# Patient Record
Sex: Female | Born: 2005 | Race: Black or African American | Hispanic: No | Marital: Single | Smoking: Never smoker
Health system: Southern US, Community
[De-identification: ages and names within clinical notes are randomized; demographics above are authoritative.]

## PROBLEM LIST (undated history)

## (undated) ENCOUNTER — Emergency Department (HOSPITAL_COMMUNITY): Source: Home / Self Care

## (undated) ENCOUNTER — Emergency Department (HOSPITAL_COMMUNITY): Admission: EM | Payer: Medicaid Other | Source: Home / Self Care

## (undated) DIAGNOSIS — D571 Sickle-cell disease without crisis: Secondary | ICD-10-CM

## (undated) DIAGNOSIS — F419 Anxiety disorder, unspecified: Secondary | ICD-10-CM

## (undated) DIAGNOSIS — F32A Depression, unspecified: Secondary | ICD-10-CM

## (undated) DIAGNOSIS — F4310 Post-traumatic stress disorder, unspecified: Secondary | ICD-10-CM

## (undated) HISTORY — PX: CHOLECYSTECTOMY: SHX55

## (undated) HISTORY — PX: WRIST SURGERY: SHX841

---
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
## 2023-03-29 DIAGNOSIS — F411 Generalized anxiety disorder: Secondary | ICD-10-CM | POA: Diagnosis present

## 2023-05-27 ENCOUNTER — Emergency Department: Payer: Medicaid Other

## 2023-05-27 ENCOUNTER — Emergency Department
Admission: EM | Admit: 2023-05-27 | Discharge: 2023-05-28 | Disposition: A | Discharge status: Other Acute Inpatient Hospital | Payer: Medicaid Other | Attending: Internal Medicine | Admitting: Internal Medicine

## 2023-05-27 ENCOUNTER — Encounter (HOSPITAL_COMMUNITY): Payer: Self-pay

## 2023-05-27 ENCOUNTER — Encounter: Payer: Self-pay | Admitting: Emergency Medicine

## 2023-05-27 ENCOUNTER — Other Ambulatory Visit: Payer: Self-pay

## 2023-05-27 DIAGNOSIS — Z79899 Other long term (current) drug therapy: Secondary | ICD-10-CM

## 2023-05-27 DIAGNOSIS — R079 Chest pain, unspecified: Secondary | ICD-10-CM | POA: Insufficient documentation

## 2023-05-27 DIAGNOSIS — F32A Depression, unspecified: Secondary | ICD-10-CM | POA: Diagnosis present

## 2023-05-27 DIAGNOSIS — Z833 Family history of diabetes mellitus: Secondary | ICD-10-CM

## 2023-05-27 DIAGNOSIS — Z91010 Allergy to peanuts: Secondary | ICD-10-CM

## 2023-05-27 DIAGNOSIS — R0602 Shortness of breath: Secondary | ICD-10-CM | POA: Diagnosis not present

## 2023-05-27 DIAGNOSIS — Z1152 Encounter for screening for COVID-19: Secondary | ICD-10-CM

## 2023-05-27 DIAGNOSIS — Z91018 Allergy to other foods: Secondary | ICD-10-CM

## 2023-05-27 DIAGNOSIS — D57419 Sickle-cell thalassemia with crisis, unspecified: Secondary | ICD-10-CM | POA: Insufficient documentation

## 2023-05-27 DIAGNOSIS — R7989 Other specified abnormal findings of blood chemistry: Secondary | ICD-10-CM | POA: Diagnosis not present

## 2023-05-27 DIAGNOSIS — J45909 Unspecified asthma, uncomplicated: Secondary | ICD-10-CM | POA: Diagnosis present

## 2023-05-27 DIAGNOSIS — J690 Pneumonitis due to inhalation of food and vomit: Secondary | ICD-10-CM | POA: Diagnosis present

## 2023-05-27 DIAGNOSIS — J189 Pneumonia, unspecified organism: Secondary | ICD-10-CM | POA: Diagnosis not present

## 2023-05-27 DIAGNOSIS — Z7951 Long term (current) use of inhaled steroids: Secondary | ICD-10-CM

## 2023-05-27 DIAGNOSIS — F419 Anxiety disorder, unspecified: Secondary | ICD-10-CM | POA: Diagnosis present

## 2023-05-27 DIAGNOSIS — J351 Hypertrophy of tonsils: Secondary | ICD-10-CM | POA: Diagnosis present

## 2023-05-27 DIAGNOSIS — Z885 Allergy status to narcotic agent status: Secondary | ICD-10-CM

## 2023-05-27 DIAGNOSIS — F4310 Post-traumatic stress disorder, unspecified: Secondary | ICD-10-CM | POA: Diagnosis present

## 2023-05-27 DIAGNOSIS — D5700 Hb-SS disease with crisis, unspecified: Secondary | ICD-10-CM | POA: Diagnosis not present

## 2023-05-27 DIAGNOSIS — Z8673 Personal history of transient ischemic attack (TIA), and cerebral infarction without residual deficits: Secondary | ICD-10-CM

## 2023-05-27 DIAGNOSIS — G8929 Other chronic pain: Secondary | ICD-10-CM | POA: Diagnosis present

## 2023-05-27 DIAGNOSIS — M879 Osteonecrosis, unspecified: Secondary | ICD-10-CM | POA: Diagnosis present

## 2023-05-27 LAB — COMPREHENSIVE METABOLIC PANEL
ALT: 289 U/L — ABNORMAL HIGH (ref 0–44)
AST: 108 U/L — ABNORMAL HIGH (ref 15–41)
Albumin: 4 g/dL (ref 3.5–5.0)
Alkaline Phosphatase: 195 U/L — ABNORMAL HIGH (ref 38–126)
Anion gap: 8 (ref 5–15)
BUN: 6 mg/dL (ref 6–20)
CO2: 22 mmol/L (ref 22–32)
Calcium: 8.8 mg/dL — ABNORMAL LOW (ref 8.9–10.3)
Chloride: 107 mmol/L (ref 98–111)
Creatinine, Ser: 0.51 mg/dL (ref 0.44–1.00)
GFR, Estimated: >60 mL/min (ref >60)
Glucose, Bld: 95 mg/dL (ref 70–99)
Potassium: 3.6 mmol/L (ref 3.5–5.1)
Sodium: 137 mmol/L (ref 135–145)
Total Bilirubin: 3.3 mg/dL — ABNORMAL HIGH (ref 0.0–1.2)
Total Protein: 7.9 g/dL (ref 6.5–8.1)

## 2023-05-27 LAB — CBC WITH DIFFERENTIAL/PLATELET
Abs Immature Granulocytes: 0.12 10*3/uL — ABNORMAL HIGH (ref 0.00–0.07)
Basophils Absolute: 0.1 10*3/uL (ref 0.0–0.1)
Basophils Relative: 1 %
Eosinophils Absolute: 0.2 10*3/uL (ref 0.0–0.5)
Eosinophils Relative: 2 %
HCT: 24 % — ABNORMAL LOW (ref 36.0–46.0)
Hemoglobin: 8.4 g/dL — ABNORMAL LOW (ref 12.0–15.0)
Immature Granulocytes: 1 %
Lymphocytes Relative: 21 %
Lymphs Abs: 2.9 10*3/uL (ref 0.7–4.0)
MCH: 38.2 pg — ABNORMAL HIGH (ref 26.0–34.0)
MCHC: 35 g/dL (ref 30.0–36.0)
MCV: 109.1 fL — ABNORMAL HIGH (ref 80.0–100.0)
Monocytes Absolute: 1.6 10*3/uL — ABNORMAL HIGH (ref 0.1–1.0)
Monocytes Relative: 11 %
Neutro Abs: 9.3 10*3/uL — ABNORMAL HIGH (ref 1.7–7.7)
Neutrophils Relative %: 64 %
Platelets: 411 10*3/uL — ABNORMAL HIGH (ref 150–400)
RBC: 2.2 MIL/uL — ABNORMAL LOW (ref 3.87–5.11)
RDW: 19.1 % — ABNORMAL HIGH (ref 11.5–15.5)
Smear Review: NORMAL
WBC: 14.3 10*3/uL — ABNORMAL HIGH (ref 4.0–10.5)
nRBC: 14.1 % — ABNORMAL HIGH (ref 0.0–0.2)

## 2023-05-27 LAB — RETICULOCYTES
Immature Retic Fract: 37.8 % — ABNORMAL HIGH (ref 2.3–15.9)
RBC.: 2.18 MIL/uL — ABNORMAL LOW (ref 3.87–5.11)
Retic Count, Absolute: 386.5 10*3/uL — ABNORMAL HIGH (ref 19.0–186.0)
Retic Ct Pct: 17.7 % — ABNORMAL HIGH (ref 0.4–3.1)

## 2023-05-27 LAB — GROUP A STREP BY PCR: Group A Strep by PCR: NOT DETECTED

## 2023-05-27 LAB — TROPONIN I (HIGH SENSITIVITY)
Troponin I (High Sensitivity): 4 ng/L (ref <18)
Troponin I (High Sensitivity): 6 ng/L (ref <18)

## 2023-05-27 LAB — D-DIMER, QUANTITATIVE: D-Dimer, Quant: 2.42 ug{FEU}/mL — ABNORMAL HIGH (ref 0.00–0.50)

## 2023-05-27 LAB — TYPE AND SCREEN

## 2023-05-27 LAB — RESP PANEL BY RT-PCR (RSV, FLU A&B, COVID)  RVPGX2
Influenza A by PCR: NEGATIVE
Influenza B by PCR: NEGATIVE
Resp Syncytial Virus by PCR: NEGATIVE
SARS Coronavirus 2 by RT PCR: NEGATIVE

## 2023-05-27 LAB — LACTIC ACID, PLASMA
Lactic Acid, Venous: 1 mmol/L (ref 0.5–1.9)
Lactic Acid, Venous: 1 mmol/L (ref 0.5–1.9)

## 2023-05-27 LAB — HCG, QUANTITATIVE, PREGNANCY: hCG, Beta Chain, Quant, S: <1 m[IU]/mL (ref <5)

## 2023-05-27 LAB — PROTIME-INR
INR: 1.3 — ABNORMAL HIGH (ref 0.8–1.2)
Prothrombin Time: 16.1 s — ABNORMAL HIGH (ref 11.4–15.2)

## 2023-05-27 LAB — PROCALCITONIN: Procalcitonin: <0.1 ng/mL

## 2023-05-27 MED ORDER — OXYCODONE HCL 5 MG PO TABS: 10.0000 mg | ORAL_TABLET | ORAL | Status: DC | PRN

## 2023-05-27 MED ORDER — HYDROMORPHONE HCL 1 MG/ML IJ SOLN
2.0000 mg | INTRAMUSCULAR | Status: AC
Start: 2023-05-27 — End: 2023-05-27
  Administered 2023-05-27: 2 mg via INTRAVENOUS
  Filled 2023-05-27: qty 2

## 2023-05-27 MED ORDER — IOHEXOL 350 MG/ML SOLN
75.0000 mL | Freq: Once | INTRAVENOUS | Status: AC | PRN
  Administered 2023-05-27: 75 mL via INTRAVENOUS

## 2023-05-27 MED ORDER — MOMETASONE FURO-FORMOTEROL FUM 100-5 MCG/ACT IN AERO: 2.0000 | INHALATION_SPRAY | Freq: Two times a day (BID) | RESPIRATORY_TRACT | Status: DC

## 2023-05-27 MED ORDER — FLUTICASONE PROPIONATE 50 MCG/ACT NA SUSP: 1.0000 | Freq: Every day | NASAL | Status: DC | PRN

## 2023-05-27 MED ORDER — ONDANSETRON HCL 4 MG/2ML IJ SOLN: 4.0000 mg | INTRAMUSCULAR | Status: DC | PRN

## 2023-05-27 MED ORDER — OXYCODONE HCL 5 MG PO TABS: 5.0000 mg | ORAL_TABLET | ORAL | Status: DC | PRN

## 2023-05-27 MED ORDER — CEFTRIAXONE SODIUM 2 G IJ SOLR
2.0000 g | Freq: Once | INTRAMUSCULAR | Status: AC
  Administered 2023-05-27: 2 g via INTRAVENOUS
  Filled 2023-05-27: qty 20

## 2023-05-27 MED ORDER — HYDROMORPHONE HCL 1 MG/ML IJ SOLN
2.0000 mg | INTRAMUSCULAR | Status: AC
  Administered 2023-05-27: 2 mg via INTRAVENOUS
  Filled 2023-05-27: qty 2

## 2023-05-27 MED ORDER — SODIUM CHLORIDE 0.9 % IV SOLN
2.0000 g | INTRAVENOUS | Status: DC
  Administered 2023-05-28: 2 g via INTRAVENOUS
  Filled 2023-05-27: qty 20

## 2023-05-27 MED ORDER — GUAIFENESIN 100 MG/5ML PO LIQD: 5.0000 mL | ORAL | Status: DC | PRN

## 2023-05-27 MED ORDER — HYDROMORPHONE HCL 1 MG/ML IJ SOLN
2.0000 mg | INTRAMUSCULAR | Status: DC | PRN
  Administered 2023-05-27 – 2023-05-28 (×16): 2 mg via INTRAVENOUS
  Filled 2023-05-27 (×16): qty 2

## 2023-05-27 MED ORDER — KETOROLAC TROMETHAMINE 30 MG/ML IJ SOLN
30.0000 mg | Freq: Once | INTRAMUSCULAR | Status: AC
  Administered 2023-05-27: 30 mg via INTRAVENOUS
  Filled 2023-05-27: qty 1

## 2023-05-27 MED ORDER — OXYCODONE HCL 5 MG PO TABS
10.0000 mg | ORAL_TABLET | Freq: Once | ORAL | Status: AC
  Administered 2023-05-27: 10 mg via ORAL
  Filled 2023-05-27: qty 2

## 2023-05-27 MED ORDER — DULOXETINE HCL 20 MG PO CPEP
40.0000 mg | ORAL_CAPSULE | Freq: Every day | ORAL | Status: DC
  Administered 2023-05-27 – 2023-05-28 (×2): 40 mg via ORAL
  Filled 2023-05-27 (×2): qty 2

## 2023-05-27 MED ORDER — ALBUTEROL SULFATE (2.5 MG/3ML) 0.083% IN NEBU: 2.5000 mL | INHALATION_SOLUTION | Freq: Four times a day (QID) | RESPIRATORY_TRACT | Status: DC | PRN

## 2023-05-27 MED ORDER — ACETAMINOPHEN 325 MG PO TABS: 650.0000 mg | ORAL_TABLET | Freq: Four times a day (QID) | ORAL | Status: DC | PRN

## 2023-05-27 MED ORDER — HYDROMORPHONE HCL 1 MG/ML IJ SOLN
1.0000 mg | INTRAMUSCULAR | Status: DC | PRN
  Administered 2023-05-27: 1 mg via INTRAVENOUS
  Filled 2023-05-27: qty 1

## 2023-05-27 MED ORDER — SODIUM CHLORIDE 0.9 % IV SOLN
INTRAVENOUS | Status: DC
Start: 2023-05-27 — End: 2023-05-28

## 2023-05-27 MED ORDER — SODIUM CHLORIDE 0.9 % IV SOLN
500.0000 mg | Freq: Once | INTRAVENOUS | Status: AC
  Administered 2023-05-27: 500 mg via INTRAVENOUS
  Filled 2023-05-27: qty 5

## 2023-05-27 MED ORDER — SODIUM CHLORIDE 0.9 % IV SOLN
500.0000 mg | INTRAVENOUS | Status: AC
  Administered 2023-05-28: 500 mg via INTRAVENOUS
  Filled 2023-05-27: qty 5

## 2023-05-27 NOTE — Consult Note (Signed)
 Initial Consultation Note   Patient: Bailey Reese FMW:[id number] DOB: [birth year] PCP: System, Provider Not In DOA: 05/27/2023 DOS: the patient was seen and examined on 05/27/2023 Primary service: Claudene Rover, MD  Referring physician: Neomi Neptune MD Reason for consult: Sickle cell crisis.  Assessment/Plan: Bailey Reese is a 18 y.o. 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 She does have evidence of pneumonia on CT chest.   Recommendations Sickle cell pain crisis- Reticulocyte count 386.5, D-dimer 2.42, CT chest negative for PE. Hemoglobin 8.4.  Monitor H&H closely.  Active type and screen. Patient will be continued on IV hydration. Continue IV dilaudid  for severe pain and oral opiates for break through. Patient is not admitted to [facility], recommended transfer to [facility] once bed is available.  Pneumonia- Interstitial patchy opacities in the lower lobes concerning for pneumonia. She is not hypoxic, does have productive cough. Continue with Rocephin , azithromycin  therapy. Bronchodilators, mucinex  ordered. Encourage incentive spirometry.  Elevated LFT: Care everywhere records reviewed. Recent admission to [facility] showed high LFT presumed to be from Olanzapine  started for her nausea. Liver uls 1/9 unremarkable. LFT trending down. Continue to monitor.  Asthma: Home inhalers resumed.  Anxiety: Cymbalta  ordered.   TRH will continue to follow the patient.  HPI: Bailey Reese is a 18 y.o. 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 Pain did not improve with her home dose Oxycodone  progressively worsened brought in by EMS. She denies fever, chills. No nausea, reports low appetite. She is weak.   In the ED, patient is started on IV fluids, IV antibiotics, IV opiate medications, IV Zofran .  Laboratory findings showed reticulocyte count 386, WBC 14.3, hemoglobin 8.4, AST 108, ALT 289, total bili 3.3, D-dimer 2.42.  CT angio chest revealed trace pleural effusions, interstitial and patchy opacities in the lower lobes possible pneumonia, aspiration or low inspiration, upper lobe air trapping question pharyngitis, avascular necrosis right humeral head.  Review of Systems: As mentioned in the history of present illness. All other systems reviewed and are negative. Past Medical History:  Diagnosis Date   Anxiety    Depression    PTSD (post-traumatic stress disorder)    Sickle cell anemia (HCC)    Past Surgical History:  Procedure Laterality Date   CHOLECYSTECTOMY     WRIST SURGERY     Social History:  reports that she has never smoked. She has never used smokeless tobacco. She reports that she does not drink alcohol . No history on file for drug use.  Allergies  Allergen Reactions   Other Swelling    Pitted fruits   Prunus Persica Swelling    Lips swell    Cherry Itching    Lips itch   Cherry Itching, Swelling and Other (See Comments)    Mouth and throat get itchy & lips swell; throat tightens   Morphine  And Codeine Hives and Itching   Other Itching, Swelling and Other (See Comments)    PITTED FRUITS = Mouth and throat get itchy & lips swell; throat tightens    Peach [Prunus Persica] Itching,  Swelling and Other (See Comments)    Mouth and throat get itchy & lips swell; throat tightens   Peanut Butter Flavoring Agent (Non-Screening) Itching    Per pt mouth gets itchy   Peanut-Containing Drug Products Itching, Swelling and Other (See Comments)    Mouth and throat get  itchy & lips swell; throat tightens; chest tightens   Plum Pulp Itching, Swelling and Other (See Comments)    Mouth and throat get itchy & lips swell; throat tightens    Family History  Problem Relation Age of Onset   Diabetes Paternal Aunt     Prior to Admission medications   Medication Sig Start Date End Date Taking? Authorizing Provider  albuterol  (PROAIR  HFA) 108 (90 Base) MCG/ACT inhaler Inhale 2 puffs into the lungs every 6 (six) hours as needed for wheezing or shortness of breath.   Yes [provider]  budesonide-formoterol  (SYMBICORT) 80-4.5 MCG/ACT inhaler Inhale 2 puffs into the lungs daily. 12/29/22 12/29/23 Yes [provider]  Cholecalciferol (VITAMIN D -1000 MAX ST) 25 MCG (1000 UT) tablet Take 1,000 Units by mouth daily. 09/02/20  Yes [provider]  DULoxetine  (CYMBALTA ) 20 MG capsule Take 40 mg by mouth daily.   Yes [provider]  fexofenadine (ALLEGRA) 180 MG tablet Take 180 mg by mouth daily as needed. 12/29/22  Yes [provider]  fluticasone  (FLONASE ) 50 MCG/ACT nasal spray Place 1 spray into both nostrils daily as needed. 12/29/22  Yes [provider]  hydroxyurea  (HYDREA ) 500 MG capsule Take 1,000 mg by mouth daily. May take with food to minimize GI side effects.   Yes [provider]  morphine  (MS CONTIN ) 30 MG 12 hr tablet Take 1 tablet (30 mg total) by mouth every 12 (twelve) hours. 08/30/17  Yes Beg, Amber, MD  OLANZapine  zydis (ZYPREXA ) 5 MG disintegrating tablet Take 5 mg by mouth at bedtime. 04/29/23  Yes [provider]  ondansetron  (ZOFRAN -ODT) 4 MG disintegrating tablet Take 4 mg by mouth every 8 (eight) hours as needed. 02/02/23  Yes [provider]  oxyCODONE  (OXY IR/ROXICODONE ) 5 MG immediate release tablet Take 1 tablet (5 mg total) by mouth every 4 (four) hours as needed for severe pain or breakthrough pain. 08/30/17  Yes Beg, Amber, MD  promethazine  (PHENERGAN ) 12.5 MG tablet  Take 12.5 mg by mouth every 6 (six) hours as needed. 05/10/23  Yes [provider]  SUBOXONE  2-0.5 MG FILM Place 1 Film under the tongue daily. 02/16/23  Yes [provider]  venlafaxine  XR (EFFEXOR -XR) 37.5 MG 24 hr capsule Take 37.5 mg by mouth daily. 02/09/23  Yes [provider]  acetaminophen  (TYLENOL ) 500 MG tablet Take 1 tablet (500 mg total) by mouth every 6 (six) hours as needed (for pain). Patient not taking: Reported on 05/27/2023 08/30/17   Edris Gauze, MD  ibuprofen  (ADVIL ,MOTRIN ) 400 MG tablet Take 400 mg by mouth every 6 (six) hours as needed (for pain). Patient not taking: Reported on 05/27/2023    [provider]    Physical Exam: Vitals:   05/27/23 0830 05/27/23 0845 05/27/23 0846 05/27/23 0900  BP: (!) 106/55   118/79  Pulse: 85   74  Resp:  14  11  Temp:   98.3 F (36.8 C)   TempSrc:   Oral   SpO2: 96%   98%  Weight:      Height:       General - Young African-American female, in distress due to pain HEENT - PERRLA, EOMI,  atraumatic head, non tender sinuses. Lung - Clear, he has rhonchi, no wheezes. Heart - S1, S2 heard, no murmurs, rubs, trace pedal edema. Abdomen-soft, nontender, nondistended Neuro - Alert, awake and oriented x 3, non focal exam. Skin - Warm and dry. Data Reviewed:      Latest Ref Rng & Units 05/27/2023   12:22 AM 05/16/2021    2:14 AM 09/24/2017    6:36 AM  CBC  WBC 4.0 - 10.5 K/uL 14.3  20.1  20.9   Hemoglobin 12.0 - 15.0 g/dL 8.4  7.3  6.0   Hematocrit 36.0 - 46.0 % 24.0  20.6  16.6   Platelets 150 - 400 K/uL 411  423  237       Latest Ref Rng & Units 05/27/2023   12:22 AM 05/16/2021    2:14 AM 09/19/2017   12:05 AM  BMP  Glucose 70 - 99 mg/dL 95  80  93   BUN 6 - 20 mg/dL 6  10  7    Creatinine 0.44 - 1.00 mg/dL 9.48  9.53  9.55   Sodium 135 - 145 mmol/L 137  134  138   Potassium 3.5 - 5.1 mmol/L 3.6  3.4  4.5   Chloride 98 - 111 mmol/L 107  105  105   CO2 22 - 32 mmol/L 22  20  25    Calcium 8.9 -  10.3 mg/dL 8.8  9.6  9.8    CT Angio Chest PE W and/or Wo Contrast Result Date: 05/27/2023 CLINICAL DATA:  18 year old with history of sickle cell disease, currently in sickle cell crisis with generalized body pain. EXAM: CT ANGIOGRAPHY CHEST WITH CONTRAST TECHNIQUE: Multidetector CT imaging of the chest was performed using the standard protocol during bolus administration of intravenous contrast. Multiplanar CT image reconstructions and MIPs were obtained to evaluate the vascular anatomy. RADIATION DOSE REDUCTION: This exam was performed according to the departmental dose-optimization program which includes automated exposure control, adjustment of the mA and/or kV according to patient size and/or use of iterative reconstruction technique. CONTRAST:  75mL OMNIPAQUE  IOHEXOL  350 MG/ML SOLN COMPARISON:  Portable chest from today, PA and lateral chest 05/16/2021. No prior cross-sectional imaging. FINDINGS: Cardiovascular: The aorta and great vessels are normal. The pulmonary trunk is prominent measuring 3.0 cm suggesting arterial hypertension, but no arterial embolism is seen. There is mild-to-moderate panchamber cardiomegaly without findings of acute right heart strain. Small pericardial effusion. Pulmonary veins are normal caliber. Mediastinum/Nodes: There are prominent palatine tonsils abutting the uvula, partially visible on the first few slices. Correlate clinically for pharyngitis. Thyroid gland and axillary spaces are unremarkable. There is residual thymus in the anterior mediastinum. No intrathoracic adenopathy is seen. The thoracic trachea, thoracic esophagus and main bronchi are unremarkable. Lungs/Pleura: There are symmetric trace pleural effusions. There is a low inspiration on exam. Interstitial and patchy opacities of the bilateral lower lobes could be due to pneumonia, aspiration, or low inspiration. In the upper lobes, other is mosaic attenuation which can be seen with small airways disease with air  trapping, but no focal infiltrate. The right middle lobe is clear. Upper Abdomen: Status post cholecystectomy. No acute findings. There is a nonspecific 1 cm hypodense lesion noted in the superior spleen, another more inferiorly measuring 1.8 cm. Musculoskeletal: Mild thoracic kyphodextroscoliosis. There are central endplate defects in some of the thoracic vertebrae consistent with history of sickle cell disease. 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 No other significant osseous findings.  No chest wall mass is seen.  Review of the MIP images confirms the above findings. IMPRESSION: 1. No evidence of arterial embolus. 2. Prominent pulmonary trunk suggesting arterial hypertension. 3. Cardiomegaly with small pericardial effusion. 4. Trace pleural effusions. 5. Interstitial and patchy opacities in the lower lobes which could be due to pneumonia, aspiration, or low inspiration. 6. Mosaic attenuation in the upper lobes which can be seen with small airways disease with air trapping. 7. Prominent palatine tonsils abutting the uvula. Correlate clinically for pharyngitis. 8. Nonspecific 1 cm and 1.8 cm hypodense lesions in the spleen. Follow-up MRI with and without contrast recommended. 9. Avascular necrosis superior right humeral head. 10. Central endplate defects in some of the thoracic vertebrae consistent with history of sickle cell disease. Electronically Signed   By: Francis Quam M.D.   On: 05/27/2023 04:01   DG Chest Portable 1 View Result Date: 05/27/2023 CLINICAL DATA:  Sickle cell crisis with generalized body pain. EXAM: PORTABLE CHEST 1 VIEW COMPARISON:  May 16, 2021 FINDINGS: The heart size and mediastinal contours are within normal limits. Breast attenuation artifact is seen overlying the bilateral lung bases. Both lungs are otherwise clear. The visualized skeletal structures are unremarkable. IMPRESSION: No active disease. Electronically Signed   By: Suzen Dials M.D.   On:  05/27/2023 01:18       Family Communication: I discussed with patient's mother over phone regarding current care plan, understands and agrees with transfer plan once bed available. Primary team communication: secure chat sent to ED physician. Thank you very much for involving us  in the care of your patient.  Author: Concepcion Riser, MD 05/27/2023 9:28 AM  For on call review [email or web address].

## 2023-05-27 NOTE — ED Notes (Signed)
 Called [facility] spoke with Sherry/ Consult to transfer/Specialty -Medicine (but they are closed) waiting on rep to call back/facesheet faxed images powershared

## 2023-05-27 NOTE — ED Triage Notes (Signed)
 Pt note hx of Sickle cell and is currently have a crisis with pain to generalized body. Pt normally takes oxycodone  10mg  several times a day as prescribed, but pain has increased since last dose at 6pm tonight pta to ED. Pt presents with oxygen from EMS due to shallow breathing due to generalized pain. Pt on 2L [place] upon triage.

## 2023-05-27 NOTE — ED Notes (Signed)
 Pt assisted in cleaning herself up. Linens changed

## 2023-05-27 NOTE — Progress Notes (Signed)
 PHARMACY - PHYSICIAN COMMUNICATION CRITICAL VALUE ALERT - BLOOD CULTURE IDENTIFICATION (BCID)  KAILA DEVRIES is an 18 y.o. female who presented to [facility] on 05/27/2023 with a chief complaint of Sickle Cell pain crisis.  Assessment:  1/4 bottles (aerobic) GPR; no BCID. Sent to [facility] for further evaluation.  Name of physician (or Provider) Contacted: Erminio Cone, NP  Current antibiotics: Azithromycin , ceftriaxone   Changes to prescribed antibiotics recommended:  Patient is on recommended antibiotics - No changes needed  No results found for this or any previous visit.  Lum VEAR Mania, PharmD Clinical Pharmacist  05/27/2023  7:06 PM

## 2023-05-27 NOTE — ED Provider Notes (Signed)
 [facility] Provider Note    Event Date/Time   First MD Initiated Contact with Patient 05/27/23 [phone]     (approximate)   History   Sickle Cell Pain Crisis (Pt note hx of Sickle cell and is currently have a crisis with pain to generalized body. Pt normally takes oxycodone  10mg  several times a day as prescribed, but pain has increased since last dose at 6pm tonight pta to ED. Pt presents with oxygen from EMS due to shallow breathing due to generalized pain. Pt on 2L [place] upon triage. )   HPI  Bailey Reese is a 18 y.o. female with history of sickle cell disease who presents to the emergency department with a sickle cell pain crisis that started today.  Complains of diffuse pain, chest pain and shortness of breath.  No fever but has had cough.  Reports she has had previous history of superficial clots.  Not on blood thinners.  No calf pain or calf tenderness.  Sats of 90% with EMS.  They placed her on a nonrebreather and route.  She states she does wear oxygen occasionally at home.  Denies any fevers, cough.  Reports she has had nausea and vomiting.  No diarrhea.  She is followed at [facility].   History provided by patient, EMS.    Past Medical History:  Diagnosis Date   Anxiety    Depression    PTSD (post-traumatic stress disorder)    Sickle cell anemia (HCC)     Past Surgical History:  Procedure Laterality Date   CHOLECYSTECTOMY     WRIST SURGERY      MEDICATIONS:  Prior to Admission medications   Medication Sig Start Date End Date Taking? Authorizing Provider  acetaminophen  (TYLENOL ) 500 MG tablet Take 1 tablet (500 mg total) by mouth every 6 (six) hours as needed (for pain). 08/30/17   Edris Gauze, MD  albuterol  (PROAIR  HFA) 108 (90 Base) MCG/ACT inhaler Inhale 2 puffs into the lungs every 6 (six) hours as needed for wheezing or shortness of breath.    [provider]  DULoxetine  (CYMBALTA ) 20 MG capsule Take 40 mg by mouth daily.    [provider]  hydroxyurea  (HYDREA ) 500 MG capsule Take 1,000 mg by mouth daily. May take with food to minimize GI side effects.    [provider]  ibuprofen  (ADVIL ,MOTRIN ) 400 MG tablet Take 400 mg by mouth every 6 (six) hours as needed (for pain).    [provider]  morphine  (MS CONTIN ) 30 MG 12 hr tablet Take 1 tablet (30 mg total) by mouth every 12 (twelve) hours. 08/30/17   Edris Gauze, MD  oxyCODONE  (OXY IR/ROXICODONE ) 5 MG immediate release tablet Take 1 tablet (5 mg total) by mouth every 4 (four) hours as needed for severe pain or breakthrough pain. 08/30/17   Edris Gauze, MD    Physical Exam   Triage Vital Signs: ED Triage Vitals [05/27/23 0014]  Encounter Vitals Group     BP (!) 132/106     Systolic BP Percentile      Diastolic BP Percentile      Pulse Rate (!) 106     Resp (!) 32     Temp 98.2 F (36.8 C)     Temp Source Oral     SpO2 100 %     Weight 140 lb (63.5 kg)     Height 5' 4 (1.626 m)     Head Circumference  Peak Flow      Pain Score 10     Pain Loc      Pain Education      Exclude from Growth Chart      Most recent vital signs: Vitals:   05/27/23 0738 05/27/23 0740  BP:  (!) 104/58  Pulse:  98  Resp: 15   Temp:    SpO2:  96%    CONSTITUTIONAL: Alert, responds appropriately to questions appears uncomfortable HEAD: Normocephalic, atraumatic EYES: Conjunctivae clear, pupils appear equal, sclera nonicteric ENT: normal nose; moist mucous membranes NECK: Supple, normal ROM CARD: Regular and tachycardic; S1 and S2 appreciated RESP: Patient is tachypneic; breath sounds clear and equal bilaterally; no wheezes, no rhonchi, no rales, no hypoxia or respiratory distress, speaking full sentences ABD/GI: Non-distended; soft, non-tender, no rebound, no guarding, no peritoneal signs BACK: The back appears normal EXT: Normal ROM in all joints; no deformity noted, no edema SKIN: Normal color for age and race; warm; no rash on exposed  skin NEURO: Moves all extremities equally, normal speech PSYCH: The patient's mood and manner are appropriate.   ED Results / Procedures / Treatments   LABS: (all labs ordered are listed, but only abnormal results are displayed) Labs Reviewed  CBC WITH DIFFERENTIAL/PLATELET - Abnormal; Notable for the following components:      Result Value   WBC 14.3 (*)    RBC 2.20 (*)    Hemoglobin 8.4 (*)    HCT 24.0 (*)    MCV 109.1 (*)    MCH 38.2 (*)    RDW 19.1 (*)    Platelets 411 (*)    nRBC 14.1 (*)    Neutro Abs 9.3 (*)    Monocytes Absolute 1.6 (*)    Abs Immature Granulocytes 0.12 (*)    All other components within normal limits  PROTIME-INR - Abnormal; Notable for the following components:   Prothrombin Time 16.1 (*)    INR 1.3 (*)    All other components within normal limits  COMPREHENSIVE METABOLIC PANEL - Abnormal; Notable for the following components:   Calcium 8.8 (*)    AST 108 (*)    ALT 289 (*)    Alkaline Phosphatase 195 (*)    Total Bilirubin 3.3 (*)    All other components within normal limits  RETICULOCYTES - Abnormal; Notable for the following components:   Retic Ct Pct 17.7 (*)    RBC. 2.18 (*)    Retic Count, Absolute 386.5 (*)    Immature Retic Fract 37.8 (*)    All other components within normal limits  D-DIMER, QUANTITATIVE - Abnormal; Notable for the following components:   D-Dimer, Quant 2.42 (*)    All other components within normal limits  RESP PANEL BY RT-PCR (RSV, FLU A&B, COVID)  RVPGX2  GROUP A STREP BY PCR  CULTURE, BLOOD (ROUTINE X 2)  CULTURE, BLOOD (ROUTINE X 2)  HCG, QUANTITATIVE, PREGNANCY  LACTIC ACID, PLASMA  LACTIC ACID, PLASMA  PROCALCITONIN  TYPE AND SCREEN  TYPE AND SCREEN  TROPONIN I (HIGH SENSITIVITY)  TROPONIN I (HIGH SENSITIVITY)     EKG:    Date: 05/27/2023 12:22 AM  Rate: 94  Rhythm: normal sinus rhythm  QRS Axis: normal  Intervals: normal  ST/T Wave abnormalities: normal  Conduction Disutrbances: none   Narrative Interpretation: unremarkable     RADIOLOGY: My personal review and interpretation of imaging:  CTA shows no PE, possible PNA.  I have personally reviewed all radiology reports.   CT  Angio Chest PE W and/or Wo Contrast Result Date: 05/27/2023 CLINICAL DATA:  18 year old with history of sickle cell disease, currently in sickle cell crisis with generalized body pain. EXAM: CT ANGIOGRAPHY CHEST WITH CONTRAST TECHNIQUE: Multidetector CT imaging of the chest was performed using the standard protocol during bolus administration of intravenous contrast. Multiplanar CT image reconstructions and MIPs were obtained to evaluate the vascular anatomy. RADIATION DOSE REDUCTION: This exam was performed according to the departmental dose-optimization program which includes automated exposure control, adjustment of the mA and/or kV according to patient size and/or use of iterative reconstruction technique. CONTRAST:  75mL OMNIPAQUE  IOHEXOL  350 MG/ML SOLN COMPARISON:  Portable chest from today, PA and lateral chest 05/16/2021. No prior cross-sectional imaging. FINDINGS: Cardiovascular: The aorta and great vessels are normal. The pulmonary trunk is prominent measuring 3.0 cm suggesting arterial hypertension, but no arterial embolism is seen. There is mild-to-moderate panchamber cardiomegaly without findings of acute right heart strain. Small pericardial effusion. Pulmonary veins are normal caliber. Mediastinum/Nodes: There are prominent palatine tonsils abutting the uvula, partially visible on the first few slices. Correlate clinically for pharyngitis. Thyroid gland and axillary spaces are unremarkable. There is residual thymus in the anterior mediastinum. No intrathoracic adenopathy is seen. The thoracic trachea, thoracic esophagus and main bronchi are unremarkable. Lungs/Pleura: There are symmetric trace pleural effusions. There is a low inspiration on exam. Interstitial and patchy opacities of the bilateral lower  lobes could be due to pneumonia, aspiration, or low inspiration. In the upper lobes, other is mosaic attenuation which can be seen with small airways disease with air trapping, but no focal infiltrate. The right middle lobe is clear. Upper Abdomen: Status post cholecystectomy. No acute findings. There is a nonspecific 1 cm hypodense lesion noted in the superior spleen, another more inferiorly measuring 1.8 cm. Musculoskeletal: Mild thoracic kyphodextroscoliosis. There are central endplate defects in some of the thoracic vertebrae consistent with history of sickle cell disease. 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 No other significant osseous findings.  No chest wall mass is seen. Review of the MIP images confirms the above findings. IMPRESSION: 1. No evidence of arterial embolus. 2. Prominent pulmonary trunk suggesting arterial hypertension. 3. Cardiomegaly with small pericardial effusion. 4. Trace pleural effusions. 5. Interstitial and patchy opacities in the lower lobes which could be due to pneumonia, aspiration, or low inspiration. 6. Mosaic attenuation in the upper lobes which can be seen with small airways disease with air trapping. 7. Prominent palatine tonsils abutting the uvula. Correlate clinically for pharyngitis. 8. Nonspecific 1 cm and 1.8 cm hypodense lesions in the spleen. Follow-up MRI with and without contrast recommended. 9. Avascular necrosis superior right humeral head. 10. Central endplate defects in some of the thoracic vertebrae consistent with history of sickle cell disease. Electronically Signed   By: Francis Quam M.D.   On: 05/27/2023 04:01   DG Chest Portable 1 View Result Date: 05/27/2023 CLINICAL DATA:  Sickle cell crisis with generalized body pain. EXAM: PORTABLE CHEST 1 VIEW COMPARISON:  May 16, 2021 FINDINGS: The heart size and mediastinal contours are within normal limits. Breast attenuation artifact is seen overlying the bilateral lung bases. Both lungs  are otherwise clear. The visualized skeletal structures are unremarkable. IMPRESSION: No active disease. Electronically Signed   By: Suzen Dials M.D.   On: 05/27/2023 01:18     PROCEDURES:  Critical Care performed: Yes, see critical care procedure note(s)   CRITICAL CARE Performed by: Josette Emiliano Welshans   Total critical care time: 45 minutes  Critical care time was exclusive of separately billable procedures and treating other patients.  Critical care was necessary to treat or prevent imminent or life-threatening deterioration.  Critical care was time spent personally by me on the following activities: development of treatment plan with patient and/or surrogate as well as nursing, discussions with consultants, evaluation of patient's response to treatment, examination of patient, obtaining history from patient or surrogate, ordering and performing treatments and interventions, ordering and review of laboratory studies, ordering and review of radiographic studies, pulse oximetry and re-evaluation of patient's condition.   SABRA1-3 Lead EKG Interpretation  Performed by: Azya Barbero, Josette SAILOR, DO Authorized by: Emalina Dubreuil, Josette SAILOR, DO     Interpretation: abnormal     ECG rate:  106   ECG rate assessment: tachycardic     Rhythm: sinus tachycardia     Ectopy: none     Conduction: normal       IMPRESSION / MDM / ASSESSMENT AND PLAN / ED COURSE  I reviewed the triage vital signs and the nursing notes.    Patient here with diffuse pain, chest pain and shortness of breath.  History of sickle cell.  The patient is on the cardiac monitor to evaluate for evidence of arrhythmia and/or significant heart rate changes.   DIFFERENTIAL DIAGNOSIS (includes but not limited to):   Sickle cell pain crisis, acute chest syndrome, pneumonia, PE, ACS, anemia, dehydration   Patient's presentation is most consistent with acute presentation with potential threat to life or bodily function.   PLAN: Will  obtain labs including troponin, D-dimer.  Will obtain COVID and flu swab, chest x-ray.  No hypoxia off of oxygen currently.  Will give IV fluids, pain and nausea medicine.  Patient may need transfer to [facility] if beds available for admission for sickle cell pain crisis.   MEDICATIONS GIVEN IN ED: Medications  0.9 %  sodium chloride  infusion (0 mLs Intravenous Stopped 05/27/23 0507)  ondansetron  (ZOFRAN ) injection 4 mg (has no administration in time range)  HYDROmorphone  (DILAUDID ) injection 2 mg (2 mg Intravenous Given 05/27/23 0740)  HYDROmorphone  (DILAUDID ) injection 2 mg (2 mg Intravenous Given 05/27/23 0110)  HYDROmorphone  (DILAUDID ) injection 2 mg (2 mg Intravenous Given 05/27/23 0150)  HYDROmorphone  (DILAUDID ) injection 2 mg (2 mg Intravenous Given 05/27/23 0249)  iohexol  (OMNIPAQUE ) 350 MG/ML injection 75 mL (75 mLs Intravenous Contrast Given 05/27/23 0256)  cefTRIAXone  (ROCEPHIN ) 2 g in sodium chloride  0.9 % 100 mL IVPB (0 g Intravenous Stopped 05/27/23 0559)  azithromycin  (ZITHROMAX ) 500 mg in sodium chloride  0.9 % 250 mL IVPB (0 mg Intravenous Stopped 05/27/23 0654)  ketorolac  (TORADOL ) 30 MG/ML injection 30 mg (30 mg Intravenous Given 05/27/23 0658)     ED COURSE: Patient's labs show leukocytosis of 14,000.  Hemoglobin of 8.4.  Elevated liver function test, total bilirubin and reticulocyte count.  Troponin x 2 negative.  D-dimer elevated, will obtain CT of the chest.  COVID, flu and RSV negative.  Chest x-ray reviewed and interpreted by myself and the radiologist and is clear.   4:11 AM  CT scan reviewed and interpreted by myself and the radiologist and shows no PE.  She does have interstitial and patchy opacities in the lower lobes that could be due to pneumonia versus aspiration versus low inspiration.  She does complain of chest pain and shortness of breath.  Will cover for community-acquired pneumonia with Rocephin  and azithromycin .  Will add on blood cultures, lactic, procalcitonin.  She also  has enlarged tonsils on CT scan concerning for  tonsillitis/pharyngitis.  Will obtain strep test.  Patient reports pain poorly controlled after multiple rounds of Dilaudid  and is requesting admission.  She is normally cared for at [facility].  Will contact their transfer center at this time.  Patient is hemodynamically stable.  5:05 AM  Spoke with Dr. Cort with [facility].  They are unable to accept patient at this time due to no bed availability.  Also do not feel that she needs to be placed on the wait list just for patient continuity of care.  Will discuss with [facility] as we are unable to admit sickle cell patients to [facility].   5:47 AM  Discussed with Dr. Shona with hospitalist service at [facility].  Unfortunately [facility] also has no beds available.  She recommends consulting Dr. Lawence here with [facility] hospitalist service for consultation to help with managing the patient while in the ED and then in the morning to the sickle cell team can be consulted to see if they are comfortable taking her to [facility] as it appears she is a very complex sickle cell patient.  Will reach out to Dr. Lawence for further recommendations.  6:41 AM  Spoke with [facility] transfer center.  Updated patient who appears more comfortable, resting on exam, improved vitals, tolerating po.  She still states pain is 10/10 and feels she needs admission for pain control.  7:49 AM  No call back from [facility].  Awaiting our hospitalist recommendations and their discussion with sickle team at [facility].  Signed out to oncoming EDP.  CONSULTS: [facility] consulted.  Unable to accept patient at this time due to being at capacity.  Discussed with [facility] hospitalist Dr. Shona at [facility].  Also unable to accept patient at this time due to no bed availability.   OUTSIDE RECORDS REVIEWED: Reviewed last admission within the [facility] in 2019.       FINAL CLINICAL IMPRESSION(S) / ED DIAGNOSES   Final diagnoses:  Sickle cell pain  crisis (HCC)  Community acquired pneumonia, unspecified laterality     Rx / DC Orders   ED Discharge Orders     None        Note:  This document was prepared using Dragon voice recognition software and may include unintentional dictation errors.   Annalaya Wile, Josette SAILOR, DO 05/27/23 [phone]

## 2023-05-28 ENCOUNTER — Encounter (HOSPITAL_COMMUNITY): Payer: Self-pay

## 2023-05-28 ENCOUNTER — Encounter (HOSPITAL_COMMUNITY): Payer: Self-pay | Admitting: Internal Medicine

## 2023-05-28 ENCOUNTER — Inpatient Hospital Stay (HOSPITAL_COMMUNITY)
Admission: EM | Admit: 2023-05-28 | Discharge: 2023-06-08 | DRG: 812 | Disposition: A | Discharge status: Home / Self Care | Payer: Medicaid Other | Source: Other Acute Inpatient Hospital | Attending: Internal Medicine | Admitting: Internal Medicine

## 2023-05-28 ENCOUNTER — Other Ambulatory Visit: Payer: Self-pay

## 2023-05-28 DIAGNOSIS — G894 Chronic pain syndrome: Secondary | ICD-10-CM | POA: Diagnosis present

## 2023-05-28 DIAGNOSIS — D57419 Sickle-cell thalassemia with crisis, unspecified: Secondary | ICD-10-CM | POA: Diagnosis not present

## 2023-05-28 DIAGNOSIS — D75838 Other thrombocytosis: Secondary | ICD-10-CM | POA: Diagnosis present

## 2023-05-28 DIAGNOSIS — F4310 Post-traumatic stress disorder, unspecified: Secondary | ICD-10-CM | POA: Diagnosis present

## 2023-05-28 DIAGNOSIS — Z91018 Allergy to other foods: Secondary | ICD-10-CM

## 2023-05-28 DIAGNOSIS — Z9081 Acquired absence of spleen: Secondary | ICD-10-CM

## 2023-05-28 DIAGNOSIS — Z7951 Long term (current) use of inhaled steroids: Secondary | ICD-10-CM | POA: Diagnosis not present

## 2023-05-28 DIAGNOSIS — F411 Generalized anxiety disorder: Secondary | ICD-10-CM | POA: Diagnosis present

## 2023-05-28 DIAGNOSIS — D5700 Hb-SS disease with crisis, unspecified: Secondary | ICD-10-CM | POA: Diagnosis present

## 2023-05-28 DIAGNOSIS — G4733 Obstructive sleep apnea (adult) (pediatric): Secondary | ICD-10-CM | POA: Diagnosis present

## 2023-05-28 DIAGNOSIS — Z8673 Personal history of transient ischemic attack (TIA), and cerebral infarction without residual deficits: Secondary | ICD-10-CM | POA: Diagnosis not present

## 2023-05-28 DIAGNOSIS — D72829 Elevated white blood cell count, unspecified: Secondary | ICD-10-CM | POA: Diagnosis present

## 2023-05-28 DIAGNOSIS — D75839 Thrombocytosis, unspecified: Secondary | ICD-10-CM | POA: Diagnosis present

## 2023-05-28 DIAGNOSIS — Z91010 Allergy to peanuts: Secondary | ICD-10-CM

## 2023-05-28 DIAGNOSIS — J4520 Mild intermittent asthma, uncomplicated: Secondary | ICD-10-CM | POA: Diagnosis present

## 2023-05-28 DIAGNOSIS — Z9049 Acquired absence of other specified parts of digestive tract: Secondary | ICD-10-CM | POA: Diagnosis not present

## 2023-05-28 DIAGNOSIS — D638 Anemia in other chronic diseases classified elsewhere: Secondary | ICD-10-CM | POA: Diagnosis present

## 2023-05-28 DIAGNOSIS — Z833 Family history of diabetes mellitus: Secondary | ICD-10-CM

## 2023-05-28 DIAGNOSIS — Z885 Allergy status to narcotic agent status: Secondary | ICD-10-CM | POA: Diagnosis not present

## 2023-05-28 DIAGNOSIS — Z79899 Other long term (current) drug therapy: Secondary | ICD-10-CM

## 2023-05-28 LAB — CBC
HCT: 21.7 % — ABNORMAL LOW (ref 36.0–46.0)
Hemoglobin: 8.1 g/dL — ABNORMAL LOW (ref 12.0–15.0)
MCH: 39.3 pg — ABNORMAL HIGH (ref 26.0–34.0)
MCHC: 37.3 g/dL — ABNORMAL HIGH (ref 30.0–36.0)
MCV: 105.3 fL — ABNORMAL HIGH (ref 80.0–100.0)
Platelets: 374 10*3/uL (ref 150–400)
RBC: 2.06 MIL/uL — ABNORMAL LOW (ref 3.87–5.11)
RDW: 18.6 % — ABNORMAL HIGH (ref 11.5–15.5)
WBC: 9.9 10*3/uL (ref 4.0–10.5)
nRBC: 9.4 % — ABNORMAL HIGH (ref 0.0–0.2)

## 2023-05-28 LAB — TYPE AND SCREEN
ABO/RH(D): B POS
Antibody Screen: NEGATIVE

## 2023-05-28 LAB — CREATININE, SERUM
Creatinine, Ser: 0.34 mg/dL — ABNORMAL LOW (ref 0.44–1.00)
GFR, Estimated: >60 mL/min (ref >60)

## 2023-05-28 LAB — HIV ANTIBODY (ROUTINE TESTING W REFLEX): HIV Screen 4th Generation wRfx: NONREACTIVE

## 2023-05-28 MED ORDER — ONDANSETRON HCL 4 MG/2ML IJ SOLN
4.0000 mg | Freq: Four times a day (QID) | INTRAMUSCULAR | Status: DC | PRN
  Administered 2023-06-01 – 2023-06-07 (×8): 4 mg via INTRAVENOUS
  Filled 2023-05-28 (×8): qty 2

## 2023-05-28 MED ORDER — DULOXETINE HCL 20 MG PO CPEP: 40.0000 mg | ORAL_CAPSULE | Freq: Every day | ORAL | Status: DC

## 2023-05-28 MED ORDER — AZITHROMYCIN 500 MG PO TABS: 500.0000 mg | ORAL_TABLET | Freq: Every day | ORAL | Status: DC

## 2023-05-28 MED ORDER — POLYETHYLENE GLYCOL 3350 17 G PO PACK: 17.0000 g | PACK | Freq: Every day | ORAL | Status: DC | PRN

## 2023-05-28 MED ORDER — DEXTROSE-SODIUM CHLORIDE 5-0.45 % IV SOLN: INTRAVENOUS | Status: DC

## 2023-05-28 MED ORDER — MOMETASONE FURO-FORMOTEROL FUM 100-5 MCG/ACT IN AERO
2.0000 | INHALATION_SPRAY | Freq: Two times a day (BID) | RESPIRATORY_TRACT | Status: DC
  Administered 2023-05-28 – 2023-06-08 (×22): 2 via RESPIRATORY_TRACT
  Filled 2023-05-28: qty 8.8

## 2023-05-28 MED ORDER — ALBUTEROL SULFATE (2.5 MG/3ML) 0.083% IN NEBU
2.5000 mg | INHALATION_SOLUTION | Freq: Four times a day (QID) | RESPIRATORY_TRACT | Status: DC | PRN
  Administered 2023-05-31: 2.5 mg via RESPIRATORY_TRACT
  Filled 2023-05-28: qty 3

## 2023-05-28 MED ORDER — FLUTICASONE PROPIONATE 50 MCG/ACT NA SUSP: 1.0000 | Freq: Every day | NASAL | Status: DC | PRN

## 2023-05-28 MED ORDER — NALOXONE HCL 0.4 MG/ML IJ SOLN: 0.4000 mg | INTRAMUSCULAR | Status: DC | PRN

## 2023-05-28 MED ORDER — ENOXAPARIN SODIUM 40 MG/0.4ML IJ SOSY
40.0000 mg | PREFILLED_SYRINGE | INTRAMUSCULAR | Status: DC
  Filled 2023-05-28 (×3): qty 0.4

## 2023-05-28 MED ORDER — SENNOSIDES-DOCUSATE SODIUM 8.6-50 MG PO TABS
1.0000 | ORAL_TABLET | Freq: Two times a day (BID) | ORAL | Status: DC
  Administered 2023-05-28 – 2023-06-08 (×22): 1 via ORAL
  Filled 2023-05-28 (×22): qty 1

## 2023-05-28 MED ORDER — ONDANSETRON 4 MG PO TBDP
4.0000 mg | ORAL_TABLET | Freq: Three times a day (TID) | ORAL | Status: DC | PRN
  Administered 2023-05-30 – 2023-06-08 (×5): 4 mg via ORAL
  Filled 2023-05-28 (×5): qty 1

## 2023-05-28 MED ORDER — HYDROXYUREA 500 MG PO CAPS: 1000.0000 mg | ORAL_CAPSULE | Freq: Every day | ORAL | Status: DC

## 2023-05-28 MED ORDER — HYDROMORPHONE 1 MG/ML IV SOLN
INTRAVENOUS | Status: DC
  Administered 2023-05-28: 30 mg via INTRAVENOUS
  Administered 2023-05-28: 10.5 mL via INTRAVENOUS
  Administered 2023-05-28: 12 mL via INTRAVENOUS
  Administered 2023-05-29: 10 mL via INTRAVENOUS
  Administered 2023-05-29: 9.5 mg via INTRAVENOUS
  Administered 2023-05-29: 5 mL via INTRAVENOUS
  Administered 2023-05-29: 6 mg via INTRAVENOUS
  Administered 2023-05-29: 7 mg via INTRAVENOUS
  Administered 2023-05-29: 30 mg via INTRAVENOUS
  Administered 2023-05-29: 4.5 mL via INTRAVENOUS
  Administered 2023-05-29 – 2023-05-30 (×2): 30 mg via INTRAVENOUS
  Administered 2023-05-30: 6.7 mg via INTRAVENOUS
  Administered 2023-05-30: 7 mg via INTRAVENOUS
  Administered 2023-05-30: 5 mg via INTRAVENOUS
  Administered 2023-05-30: 5.5 mL via INTRAVENOUS
  Administered 2023-05-30: 11 mL via INTRAVENOUS
  Administered 2023-05-30: 30 mg via INTRAVENOUS
  Administered 2023-05-31 (×2): 8 mg via INTRAVENOUS
  Administered 2023-05-31: 11.5 mg via INTRAVENOUS
  Administered 2023-05-31: 12 mL via INTRAVENOUS
  Administered 2023-05-31: 30 mg via INTRAVENOUS
  Administered 2023-05-31: 3.5 mg via INTRAVENOUS
  Administered 2023-05-31: 6 mg via INTRAVENOUS
  Administered 2023-06-01: 4 mg via INTRAVENOUS
  Administered 2023-06-01: 1 mg via INTRAVENOUS
  Administered 2023-06-01: 30 mg via INTRAVENOUS
  Administered 2023-06-01: 7.5 mg via INTRAVENOUS
  Administered 2023-06-01: 1.5 mg via INTRAVENOUS
  Administered 2023-06-02: 30 mg via INTRAVENOUS
  Administered 2023-06-02 (×3): 6.5 mg via INTRAVENOUS
  Administered 2023-06-02: 0.5 mg via INTRAVENOUS
  Administered 2023-06-02: 4 mg via INTRAVENOUS
  Administered 2023-06-03: 8 mg via INTRAVENOUS
  Administered 2023-06-03: 30 mg via INTRAVENOUS
  Administered 2023-06-03: 8.5 mg via INTRAVENOUS
  Administered 2023-06-03: 8 mg via INTRAVENOUS
  Administered 2023-06-03: 1 mg via INTRAVENOUS
  Administered 2023-06-03: 12 mg via INTRAVENOUS
  Administered 2023-06-04: 6 mg via INTRAVENOUS
  Administered 2023-06-04: 15 mg via INTRAVENOUS
  Administered 2023-06-04: 1.5 mg via INTRAVENOUS
  Administered 2023-06-04: 30 mg via INTRAVENOUS
  Administered 2023-06-04: 4.5 mg via INTRAVENOUS
  Administered 2023-06-04: 30 mg via INTRAVENOUS
  Administered 2023-06-04: 9 mg via INTRAVENOUS
  Administered 2023-06-04: 7.5 mg via INTRAVENOUS
  Administered 2023-06-05: 30 mg via INTRAVENOUS
  Administered 2023-06-05: 12.5 mg via INTRAVENOUS
  Administered 2023-06-05: 12 mg via INTRAVENOUS
  Administered 2023-06-05: 4 mg via INTRAVENOUS
  Administered 2023-06-05: 3 mg via INTRAVENOUS
  Administered 2023-06-05: 8 mg via INTRAVENOUS
  Administered 2023-06-06: 30 mg via INTRAVENOUS
  Administered 2023-06-06: 7.5 mg via INTRAVENOUS
  Administered 2023-06-06: 9 mg via INTRAVENOUS
  Administered 2023-06-06: 8 mg via INTRAVENOUS
  Administered 2023-06-06: 11.5 mg via INTRAVENOUS
  Administered 2023-06-06: 30 mg via INTRAVENOUS
  Administered 2023-06-06: 5.5 mg via INTRAVENOUS
  Administered 2023-06-06: 9.5 mg via INTRAVENOUS
  Administered 2023-06-07: 11 mg via INTRAVENOUS
  Administered 2023-06-07: 9.5 mg via INTRAVENOUS
  Administered 2023-06-07: 1.5 mg via INTRAVENOUS
  Administered 2023-06-07: 10 mg via INTRAVENOUS
  Administered 2023-06-07: 3.5 mg via INTRAVENOUS
  Administered 2023-06-07: 7 mg via INTRAVENOUS
  Administered 2023-06-07: 30 mg via INTRAVENOUS
  Administered 2023-06-08: 9.5 mg via INTRAVENOUS
  Administered 2023-06-08: 7.5 mg via INTRAVENOUS
  Administered 2023-06-08: 30 mg via INTRAVENOUS
  Administered 2023-06-08: 5.5 mg via INTRAVENOUS
  Administered 2023-06-08: 0.01 mg via INTRAVENOUS
  Filled 2023-05-28 (×17): qty 30

## 2023-05-28 MED ORDER — SODIUM CHLORIDE 0.45 % IV BOLUS
1000.0000 mL | INTRAVENOUS | Status: AC
  Administered 2023-05-28: 1000 mL via INTRAVENOUS

## 2023-05-28 MED ORDER — SODIUM CHLORIDE 0.9% FLUSH: 9.0000 mL | INTRAVENOUS | Status: DC | PRN

## 2023-05-28 MED ORDER — KETOROLAC TROMETHAMINE 15 MG/ML IJ SOLN
15.0000 mg | Freq: Four times a day (QID) | INTRAMUSCULAR | Status: AC
  Administered 2023-05-28 – 2023-06-02 (×20): 15 mg via INTRAVENOUS
  Filled 2023-05-28 (×20): qty 1

## 2023-05-28 MED ORDER — VENLAFAXINE HCL ER 37.5 MG PO CP24: 37.5000 mg | ORAL_CAPSULE | Freq: Every day | ORAL | Status: DC

## 2023-05-28 MED ORDER — DIPHENHYDRAMINE HCL 25 MG PO CAPS
25.0000 mg | ORAL_CAPSULE | ORAL | Status: DC | PRN
  Administered 2023-05-28 – 2023-06-08 (×20): 25 mg via ORAL
  Filled 2023-05-28 (×20): qty 1

## 2023-05-28 MED ORDER — BUPRENORPHINE HCL-NALOXONE HCL 2-0.5 MG SL SUBL
2.0000 | SUBLINGUAL_TABLET | Freq: Every day | SUBLINGUAL | Status: DC
  Filled 2023-05-28 (×2): qty 2

## 2023-05-28 MED ORDER — OLANZAPINE 5 MG PO TBDP: 5.0000 mg | ORAL_TABLET | Freq: Every day | ORAL | Status: DC

## 2023-05-28 NOTE — Plan of Care (Signed)

## 2023-05-28 NOTE — ED Notes (Signed)
 Consent to transfer form signed on paper and placed in medical records bin.

## 2023-05-28 NOTE — ED Provider Notes (Signed)
 3:30 AM  Pt still awaiting potential transfer to [facility].  He is being followed by the hospitalist service here for management of her sickle cell pain and community-acquired pneumonia.  Patient hemodynamically stable.  I reached out to [facility] yesterday and they were at capacity.  It is unclear if [facility] has been contacted again by the hospitalist service.  Will reach out to the transfer center again.   [facility] and [facility] were at capacity and unable to accept patient yesterday.   3:43 AM  Spoke with Rosaline at the [facility] transfer center.  Medicine is still closed at this time to admission due to capacity restraints.  There is no wait list to place patient on.  She states that the hospitalist service here will need to call again around noon later today to see if beds have opened secondary to discharges.   Vedanth Sirico, Josette SAILOR, DO 05/28/23 [phone]

## 2023-05-28 NOTE — ED Provider Notes (Addendum)
 I consult with Dr. Sim, hospitalist at [facility], who accepts the patient in transfer.  Patient is agreeable to transfer to [facility].  Clinical Course as of 05/28/23 1529  Sun May 28, 2023  1050 Dr. Sim.  [DS]    Clinical Course User Index [DS] Claudene Rover, MD      Claudene Rover, MD 05/28/23 1104    Claudene Rover, MD 05/28/23 [phone]

## 2023-05-28 NOTE — H&P (Signed)
 History and Physical    Patient: Bailey Reese FMW:[id number] DOB: 2005/10/15 DOA: 05/28/2023 DOS: the patient was seen and examined on 05/28/2023 PCP: System, Provider Not In  Patient coming from: Home  Chief Complaint: No chief complaint on file.  HPI: Bailey Reese is a 18 y.o. female with medical history significant of sickle cell disease, PTSD, depression with anxiety who usually gets her care at [facility] but was seen at [facility] for the last 48 hours where she stayed in the ER with sickle cell pain crisis.  Patient was being managed in the ER for crisis.  She was waiting for a bed to go to [facility] where she normally gets her care however there was no bed available.  Patient was transferred to our care here for continued sickle cell crisis.  Patient reported to have pain similar to her typical sickle cell crisis.  Is mainly in her legs and back.  She has vitals that are stable.  Hemoglobin is around 8.4's.  She was getting IV Dilaudid  pushes in the ER.  Patient denied any nausea vomiting or diarrhea.  Denied any viral illness.  She has had previous osteomyelitis and multiple episodes of acute chest syndromes.  Also hemorrhagic stroke in 2017.  Patient has chronic pain from her sickle cell disease.  She has been admitted for sickle cell pain management.  Review of Systems: As mentioned in the history of present illness. All other systems reviewed and are negative. Past Medical History:  Diagnosis Date   Anxiety    Depression    PTSD (post-traumatic stress disorder)    Sickle cell anemia (HCC)    Past Surgical History:  Procedure Laterality Date   CHOLECYSTECTOMY     WRIST SURGERY     Social History:  reports that she has never smoked. She has never used smokeless tobacco. She reports that she does not drink alcohol . No history on file for drug use.  Allergies  Allergen Reactions   Other Swelling    Pitted fruits   Prunus Persica Swelling    Lips  swell    Cherry Itching    Lips itch   Cherry Itching, Swelling and Other (See Comments)    Mouth and throat get itchy & lips swell; throat tightens   Morphine  And Codeine Hives and Itching   Other Itching, Swelling and Other (See Comments)    PITTED FRUITS = Mouth and throat get itchy & lips swell; throat tightens    Peach [Prunus Persica] Itching, Swelling and Other (See Comments)    Mouth and throat get itchy & lips swell; throat tightens   Peanut Butter Flavoring Agent (Non-Screening) Itching    Per pt mouth gets itchy   Peanut-Containing Drug Products Itching, Swelling and Other (See Comments)    Mouth and throat get itchy & lips swell; throat tightens; chest tightens   Plum Pulp Itching, Swelling and Other (See Comments)    Mouth and throat get itchy & lips swell; throat tightens    Family History  Problem Relation Age of Onset   Diabetes Paternal Aunt     Prior to Admission medications   Medication Sig Start Date End Date Taking? Authorizing Provider  acetaminophen  (TYLENOL ) 500 MG tablet Take 1 tablet (500 mg total) by mouth every 6 (six) hours as needed (for pain). Patient not taking: Reported on 05/27/2023 08/30/17   Edris Gauze, MD  albuterol  (PROAIR  HFA) 108 (90 Base) MCG/ACT inhaler Inhale 2 puffs into the  lungs every 6 (six) hours as needed for wheezing or shortness of breath.    [provider]  budesonide-formoterol  (SYMBICORT) 80-4.5 MCG/ACT inhaler Inhale 2 puffs into the lungs daily. 12/29/22 12/29/23  [provider]  Cholecalciferol (VITAMIN D -1000 MAX ST) 25 MCG (1000 UT) tablet Take 1,000 Units by mouth daily. 09/02/20   [provider]  DULoxetine  (CYMBALTA ) 20 MG capsule Take 40 mg by mouth daily.    [provider]  fexofenadine (ALLEGRA) 180 MG tablet Take 180 mg by mouth daily as needed. 12/29/22   [provider]  fluticasone  (FLONASE ) 50 MCG/ACT nasal spray Place 1 spray into both nostrils daily as needed. 12/29/22    [provider]  hydroxyurea  (HYDREA ) 500 MG capsule Take 1,000 mg by mouth daily. May take with food to minimize GI side effects.    [provider]  ibuprofen  (ADVIL ,MOTRIN ) 400 MG tablet Take 400 mg by mouth every 6 (six) hours as needed (for pain). Patient not taking: Reported on 05/27/2023    [provider]  morphine  (MS CONTIN ) 30 MG 12 hr tablet Take 1 tablet (30 mg total) by mouth every 12 (twelve) hours. 08/30/17   Edris Gauze, MD  OLANZapine  zydis (ZYPREXA ) 5 MG disintegrating tablet Take 5 mg by mouth at bedtime. 04/29/23   [provider]  ondansetron  (ZOFRAN -ODT) 4 MG disintegrating tablet Take 4 mg by mouth every 8 (eight) hours as needed. 02/02/23   [provider]  oxyCODONE  (OXY IR/ROXICODONE ) 5 MG immediate release tablet Take 1 tablet (5 mg total) by mouth every 4 (four) hours as needed for severe pain or breakthrough pain. 08/30/17   Edris Gauze, MD  promethazine  (PHENERGAN ) 12.5 MG tablet Take 12.5 mg by mouth every 6 (six) hours as needed. 05/10/23   [provider]  SUBOXONE  2-0.5 MG FILM Place 1 Film under the tongue daily. 02/16/23   [provider]  venlafaxine  XR (EFFEXOR -XR) 37.5 MG 24 hr capsule Take 37.5 mg by mouth daily. 02/09/23   [provider]    Physical Exam: Vitals:   05/28/23 1327  Weight: 70.3 kg  Height: 5' 4 (1.626 m)   Constitutional: Acutely ill looking no distress NAD, calm, comfortable Eyes: PERRL, lids and conjunctivae normal ENMT: Mucous membranes are moist. Posterior pharynx clear of any exudate or lesions.Normal dentition.  Neck: normal, supple, no masses, no thyromegaly Respiratory: clear to auscultation bilaterally, no wheezing, no crackles. Normal respiratory effort. No accessory muscle use.  Cardiovascular: Regular rate and rhythm, no murmurs / rubs / gallops. No extremity edema. 2+ pedal pulses. No carotid bruits.  Abdomen: no tenderness, no masses palpated. No  hepatosplenomegaly. Bowel sounds positive.  Musculoskeletal: Good range of motion, no joint swelling or tenderness, Skin: no rashes, lesions, ulcers. No induration Neurologic: CN 2-12 grossly intact. Sensation intact, DTR normal. Strength 5/5 in all 4.  Psychiatric: Normal judgment and insight. Alert and oriented x 3. Normal mood  Data Reviewed:  Hemoglobin is 8.1 with platelets 374.  White count is 9.9.  Chemistry appears to be within normal.  Acute viral screen is negative.  Assessment and Plan:  #1 sickle cell pain crisis: Patient will be admitted to medical floor.  Initiate Dilaudid  PCA, Toradol , IV D5 half as well as frequent pain assessment.  Will confirm home regimen but patient appears to be on Suboxone  for chronic pain.  We will continue with that.  Patient also appears to have chronic pain syndrome I will continue to counsel patient.  #2  anemia of chronic disease: H&H appears stable.  Monitor and transfuse if it drops below her baseline  #3 chronic pain syndrome: Continue Suboxone   #4 PTSD: Confirm on resume home regimen    Advance Care Planning:   Code Status: Full Code   Consults: None  Family Communication: No family at bedside  Severity of Illness: The appropriate patient status for this patient is INPATIENT. Inpatient status is judged to be reasonable and necessary in order to provide the required intensity of service to ensure the patient's safety. The patient's presenting symptoms, physical exam findings, and initial radiographic and laboratory data in the context of their chronic comorbidities is felt to place them at high risk for further clinical deterioration. Furthermore, it is not anticipated that the patient will be medically stable for discharge from the hospital within 2 midnights of admission.   * I certify that at the point of admission it is my clinical judgment that the patient will require inpatient hospital care spanning beyond 2 midnights from the point  of admission due to high intensity of service, high risk for further deterioration and high frequency of surveillance required.*  AuthorBETHA SIM KNOLL, MD 05/28/2023 1:41 PM  For on call review [email or web address].

## 2023-05-28 NOTE — ED Notes (Signed)
 Pt provided a full linen change after urinating on herself. New shorts and pad applied. New gown applied. Pt tolerated well. CB within reach and set up for lunch. NAD.

## 2023-05-29 DIAGNOSIS — D5700 Hb-SS disease with crisis, unspecified: Secondary | ICD-10-CM | POA: Diagnosis not present

## 2023-05-29 LAB — COMPREHENSIVE METABOLIC PANEL
ALT: 156 U/L — ABNORMAL HIGH (ref 0–44)
AST: 41 U/L (ref 15–41)
Albumin: 3.6 g/dL (ref 3.5–5.0)
Alkaline Phosphatase: 181 U/L — ABNORMAL HIGH (ref 38–126)
Anion gap: 7 (ref 5–15)
BUN: 8 mg/dL (ref 6–20)
CO2: 23 mmol/L (ref 22–32)
Calcium: 9 mg/dL (ref 8.9–10.3)
Chloride: 108 mmol/L (ref 98–111)
Creatinine, Ser: 0.34 mg/dL — ABNORMAL LOW (ref 0.44–1.00)
GFR, Estimated: >60 mL/min (ref >60)
Glucose, Bld: 86 mg/dL (ref 70–99)
Potassium: 3.9 mmol/L (ref 3.5–5.1)
Sodium: 138 mmol/L (ref 135–145)
Total Bilirubin: 1.9 mg/dL — ABNORMAL HIGH (ref 0.0–1.2)
Total Protein: 7.4 g/dL (ref 6.5–8.1)

## 2023-05-29 LAB — CBC WITH DIFFERENTIAL/PLATELET
Abs Immature Granulocytes: 0.05 10*3/uL (ref 0.00–0.07)
Basophils Absolute: 0 10*3/uL (ref 0.0–0.1)
Basophils Relative: 0 %
Eosinophils Absolute: 0.3 10*3/uL (ref 0.0–0.5)
Eosinophils Relative: 3 %
HCT: 23.5 % — ABNORMAL LOW (ref 36.0–46.0)
Hemoglobin: 8.3 g/dL — ABNORMAL LOW (ref 12.0–15.0)
Immature Granulocytes: 1 %
Lymphocytes Relative: 24 %
Lymphs Abs: 2.2 10*3/uL (ref 0.7–4.0)
MCH: 37.7 pg — ABNORMAL HIGH (ref 26.0–34.0)
MCHC: 35.3 g/dL (ref 30.0–36.0)
MCV: 106.8 fL — ABNORMAL HIGH (ref 80.0–100.0)
Monocytes Absolute: 1.3 10*3/uL — ABNORMAL HIGH (ref 0.1–1.0)
Monocytes Relative: 15 %
Neutro Abs: 5.2 10*3/uL (ref 1.7–7.7)
Neutrophils Relative %: 57 %
Platelets: 413 10*3/uL — ABNORMAL HIGH (ref 150–400)
RBC: 2.2 MIL/uL — ABNORMAL LOW (ref 3.87–5.11)
RDW: 17.9 % — ABNORMAL HIGH (ref 11.5–15.5)
WBC: 9 10*3/uL (ref 4.0–10.5)
nRBC: 5.9 % — ABNORMAL HIGH (ref 0.0–0.2)

## 2023-05-29 MED ORDER — SODIUM CHLORIDE 0.9 % IV SOLN: INTRAVENOUS | Status: AC | PRN

## 2023-05-29 NOTE — Progress Notes (Signed)
 SICKLE CELL SERVICE PROGRESS NOTE  [name] FMW:[id number] DOB: 2006/01/23 DOA: 05/28/2023 PCP: System, Provider Not In  Assessment/Plan: Principal Problem:   Sickle cell anemia with crisis (HCC) Active Problems:   Chronic pain syndrome   Leucocytosis   Thrombocytosis   Obstructive sleep apnea syndrome   GAD (generalized anxiety disorder)   Mild intermittent asthma without complication  Sickle cell pain crisis: Patient is still having 8 out of 10 pain.  Currently on Dilaudid  PCA, Toradol  and IV fluids.  We will continue current treatment with reassessment of her pain status Anemia of chronic disease: Continue to monitor H&H Obstructive sleep apnea: Not on CPAP.  Counseling provided. Chronic pain syndrome: Continue chronic pain management. Mild intermittent asthma: Continue empiric treatment with breathing treatment Leukocytosis: Secondary to vaso-occlusive crisis.  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 If 7PM-7AM, please contact night-coverage.  05/29/2023, 5:12 PM  LOS: 1 day   Brief narrative: Bailey Reese is a 18 y.o. female with medical history significant of sickle cell disease, PTSD, depression with anxiety who usually gets her care at [facility] but was seen at [facility] for the last 48 hours where she stayed in the ER with sickle cell pain crisis.  Patient was being managed in the ER for crisis.  She was waiting for a bed to go to [facility] where she normally gets her care however there was no bed available.  Patient was transferred to our care here for continued sickle cell crisis.  Patient reported to have pain similar to her typical sickle cell crisis.  Is mainly in her legs and back.  She has vitals that are stable.  Hemoglobin is around 8.4's.  She was getting IV Dilaudid  pushes in the  ER.  Patient denied any nausea vomiting or diarrhea.  Denied any viral illness.  She has had previous osteomyelitis and multiple episodes of acute chest syndromes.  Also hemorrhagic stroke in 2017.  Patient has chronic pain from her sickle cell disease.  She has been admitted for sickle cell pain management.   Consultants: None  Procedures: None  Antibiotics: None  HPI/Subjective: Patient's pain is still at 8 out of 10 mainly in her legs and back.  She denied any nausea vomiting or diarrhea.  She is doing better compared to yesterday but still not near her goals.  Objective: Vitals:   05/29/23 0807 05/29/23 1209 05/29/23 1335 05/29/23 1557  BP:   (!) 111/48   Pulse:   75   Resp: 15 14 14 13   Temp:   98.7 F (37.1 C)   TempSrc:   Oral   SpO2: 98% 100% 100% 100%  Weight:      Height:       Weight change:   Intake/Output Summary (Last 24 hours) at 05/29/2023 1712 Last data filed at 05/29/2023 1534 Gross per 24 hour  Intake 2135.5 ml  Output --  Net 2135.5 ml    General: Alert, awake, oriented x3, in no acute distress.  HEENT: [place]/AT PEERL, EOMI Neck: Trachea midline,  no masses, no thyromegal,y no JVD, no carotid bruit OROPHARYNX:  Moist, No exudate/ erythema/lesions.  Heart: Regular rate and rhythm, without murmurs, rubs, gallops, PMI non-displaced, no heaves or thrills on palpation.  Lungs: Clear to auscultation, no wheezing or rhonchi noted. No increased vocal fremitus resonant to percussion  Abdomen:  Soft, nontender, nondistended, positive bowel sounds, no masses no hepatosplenomegaly noted..  Neuro: No focal neurological deficits noted cranial nerves II through XII grossly intact. DTRs 2+ bilaterally upper and lower extremities. Strength 5 out of 5 in bilateral upper and lower extremities. Musculoskeletal: No warm swelling or erythema around joints, no spinal tenderness noted. Psychiatric: Patient alert and oriented x3, good insight and cognition, good recent to remote  recall. Lymph node survey: No cervical axillary or inguinal lymphadenopathy noted.   Data Reviewed: Basic Metabolic Panel: Recent Labs  Lab 05/27/23 0022 05/28/23 1402 05/29/23 0543  NA 137  --  138  K 3.6  --  3.9  CL 107  --  108  CO2 22  --  23  GLUCOSE 95  --  86  BUN 6  --  8  CREATININE 0.51 0.34* 0.34*  CALCIUM 8.8*  --  9.0   Liver Function Tests: Recent Labs  Lab 05/27/23 0022 05/29/23 0543  AST 108* 41  ALT 289* 156*  ALKPHOS 195* 181*  BILITOT 3.3* 1.9*  PROT 7.9 7.4  ALBUMIN 4.0 3.6   No results for input(s): LIPASE, AMYLASE in the last 168 hours. No results for input(s): AMMONIA in the last 168 hours. CBC: Recent Labs  Lab 05/27/23 0022 05/28/23 1402 05/29/23 0543  WBC 14.3* 9.9 9.0  NEUTROABS 9.3*  --  5.2  HGB 8.4* 8.1* 8.3*  HCT 24.0* 21.7* 23.5*  MCV 109.1* 105.3* 106.8*  PLT 411* 374 413*   Cardiac Enzymes: No results for input(s): CKTOTAL, CKMB, CKMBINDEX, TROPONINI in the last 168 hours. BNP (last 3 results) No results for input(s): BNP in the last 8760 hours.  ProBNP (last 3 results) No results for input(s): PROBNP in the last 8760 hours.  CBG: No results for input(s): GLUCAP in the last 168 hours.  Recent Results (from the past 240 hours)  Resp panel by RT-PCR (RSV, Flu A&B, Covid) Anterior Nasal Swab     Status: None   Collection Time: 05/27/23 12:22 AM   Specimen: Anterior Nasal Swab  Result Value Ref Range Status   SARS Coronavirus 2 by RT PCR NEGATIVE NEGATIVE Final    Comment: (NOTE) SARS-CoV-2 target nucleic acids are NOT DETECTED.  The SARS-CoV-2 RNA is generally detectable in upper respiratory specimens during the acute phase of infection. The lowest concentration of SARS-CoV-2 viral copies this assay can detect is 138 copies/mL. A negative result does not preclude SARS-Cov-2 infection and should not be used as the sole basis for treatment or other patient management decisions. A negative result  may occur with  improper specimen collection/handling, submission of specimen other than nasopharyngeal swab, presence of viral mutation(s) within the areas targeted by this assay, and inadequate number of viral copies(<138 copies/mL). A negative result must be combined with clinical observations, patient history, and epidemiological information. The expected result is Negative.  Fact Sheet for Patients:  bloggercourse.com  Fact Sheet for Healthcare Providers:  seriousbroker.it  This test is no t yet approved or cleared by the [place]  FDA and  has been authorized for detection and/or diagnosis of SARS-CoV-2 by FDA under an Emergency Use Authorization (EUA). This EUA will remain  in effect (meaning this test can be used) for the duration of the COVID-19 declaration under Section 564(b)(1) of the Act, 21 U.S.C.section 360bbb-3(b)(1), unless the authorization is terminated  or revoked sooner.       Influenza A by PCR NEGATIVE NEGATIVE Final   Influenza B by PCR NEGATIVE NEGATIVE Final  Comment: (NOTE) The Xpert Xpress SARS-CoV-2/FLU/RSV plus assay is intended as an aid in the diagnosis of influenza from Nasopharyngeal swab specimens and should not be used as a sole basis for treatment. Nasal washings and aspirates are unacceptable for Xpert Xpress SARS-CoV-2/FLU/RSV testing.  Fact Sheet for Patients: bloggercourse.com  Fact Sheet for Healthcare Providers: seriousbroker.it  This test is not yet approved or cleared by the [place]  FDA and has been authorized for detection and/or diagnosis of SARS-CoV-2 by FDA under an Emergency Use Authorization (EUA). This EUA will remain in effect (meaning this test can be used) for the duration of the COVID-19 declaration under Section 564(b)(1) of the Act, 21 U.S.C. section 360bbb-3(b)(1), unless the authorization is terminated  or revoked.     Resp Syncytial Virus by PCR NEGATIVE NEGATIVE Final    Comment: (NOTE) Fact Sheet for Patients: bloggercourse.com  Fact Sheet for Healthcare Providers: seriousbroker.it  This test is not yet approved or cleared by the [place]  FDA and has been authorized for detection and/or diagnosis of SARS-CoV-2 by FDA under an Emergency Use Authorization (EUA). This EUA will remain in effect (meaning this test can be used) for the duration of the COVID-19 declaration under Section 564(b)(1) of the Act, 21 U.S.C. section 360bbb-3(b)(1), unless the authorization is terminated or revoked.  Performed at [facility], [address] [place], [place] [address]   Blood culture (routine x 2)     Status: None (Preliminary result)   Collection Time: 05/27/23  5:05 AM   Specimen: BLOOD RIGHT ARM  Result Value Ref Range Status   Specimen Description BLOOD RIGHT ARM  Final   Special Requests   Final    BOTTLES DRAWN AEROBIC AND ANAEROBIC Blood Culture results may not be optimal due to an inadequate volume of blood received in culture bottles   Culture  Setup Time   Final    IN BOTH AEROBIC AND ANAEROBIC BOTTLES GRAM POSITIVE RODS CONSISTENT WITH PREVIOUS RESULT CRITICAL RESULT CALLED TO, READ BACK BY AND VERIFIED WITH: MADISON HUNT @ 1850 05/27/23 LD Performed at [facility], [address] [place], [place] [address]    Culture GRAM POSITIVE RODS  Final   Report Status PENDING  Incomplete  Group A Strep by PCR ([facility] Only)     Status: None   Collection Time: 05/27/23  5:07 AM   Specimen: Throat; Sterile Swab  Result Value Ref Range Status   Group A Strep by PCR NOT DETECTED NOT DETECTED Final    Comment: Performed at [facility], [address] [place], [place] [address]  Blood culture (routine x 2)     Status: Abnormal   Collection Time: 05/27/23  5:07 AM   Specimen: BLOOD RIGHT ARM  Result  Value Ref Range Status   Specimen Description   Final    BLOOD RIGHT ARM Performed at [facility], [address], [place], [place] [address]    Special Requests   Final    BOTTLES DRAWN AEROBIC AND ANAEROBIC Blood Culture results may not be optimal due to an inadequate volume of blood received in culture bottles Performed at [facility], [address] [place], [place] [address]    Culture  Setup Time   Final    IN BOTH AEROBIC AND ANAEROBIC BOTTLES GRAM POSITIVE RODS CRITICAL RESULT CALLED TO, READ BACK BY AND VERIFIED WITH: MADISON HUNT @ 1850 05/27/23 LFD Performed at [facility], [address] [place], [place] [address]  Culture (A)  Final    BACILLUS SPECIES Standardized susceptibility testing for this organism is not available. Performed at [facility], [address], [place], [place] [address]    Report Status 05/29/2023 FINAL  Final     Studies: CT Angio Chest PE W and/or Wo Contrast Result Date: 05/27/2023 CLINICAL DATA:  18 year old with history of sickle cell disease, currently in sickle cell crisis with generalized body pain. EXAM: CT ANGIOGRAPHY CHEST WITH CONTRAST TECHNIQUE: Multidetector CT imaging of the chest was performed using the standard protocol during bolus administration of intravenous contrast. Multiplanar CT image reconstructions and MIPs were obtained to evaluate the vascular anatomy. RADIATION DOSE REDUCTION: This exam was performed according to the departmental dose-optimization program which includes automated exposure control, adjustment of the mA and/or kV according to patient size and/or use of iterative reconstruction technique. CONTRAST:  75mL OMNIPAQUE  IOHEXOL  350 MG/ML SOLN COMPARISON:  Portable chest from today, PA and lateral chest 05/16/2021. No prior cross-sectional imaging. FINDINGS: Cardiovascular: The aorta and great vessels are normal. The pulmonary trunk is prominent measuring 3.0 cm suggesting  arterial hypertension, but no arterial embolism is seen. There is mild-to-moderate panchamber cardiomegaly without findings of acute right heart strain. Small pericardial effusion. Pulmonary veins are normal caliber. Mediastinum/Nodes: There are prominent palatine tonsils abutting the uvula, partially visible on the first few slices. Correlate clinically for pharyngitis. Thyroid gland and axillary spaces are unremarkable. There is residual thymus in the anterior mediastinum. No intrathoracic adenopathy is seen. The thoracic trachea, thoracic esophagus and main bronchi are unremarkable. Lungs/Pleura: There are symmetric trace pleural effusions. There is a low inspiration on exam. Interstitial and patchy opacities of the bilateral lower lobes could be due to pneumonia, aspiration, or low inspiration. In the upper lobes, other is mosaic attenuation which can be seen with small airways disease with air trapping, but no focal infiltrate. The right middle lobe is clear. Upper Abdomen: Status post cholecystectomy. No acute findings. There is a nonspecific 1 cm hypodense lesion noted in the superior spleen, another more inferiorly measuring 1.8 cm. Musculoskeletal: Mild thoracic kyphodextroscoliosis. There are central endplate defects in some of the thoracic vertebrae consistent with history of sickle cell disease. 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 No other significant osseous findings.  No chest wall mass is seen. Review of the MIP images confirms the above findings. IMPRESSION: 1. No evidence of arterial embolus. 2. Prominent pulmonary trunk suggesting arterial hypertension. 3. Cardiomegaly with small pericardial effusion. 4. Trace pleural effusions. 5. Interstitial and patchy opacities in the lower lobes which could be due to pneumonia, aspiration, or low inspiration. 6. Mosaic attenuation in the upper lobes which can be seen with small airways disease with air trapping. 7. Prominent palatine  tonsils abutting the uvula. Correlate clinically for pharyngitis. 8. Nonspecific 1 cm and 1.8 cm hypodense lesions in the spleen. Follow-up MRI with and without contrast recommended. 9. Avascular necrosis superior right humeral head. 10. Central endplate defects in some of the thoracic vertebrae consistent with history of sickle cell disease. Electronically Signed   By: Francis Quam M.D.   On: 05/27/2023 04:01   DG Chest Portable 1 View Result Date: 05/27/2023 CLINICAL DATA:  Sickle cell crisis with generalized body pain. EXAM: PORTABLE CHEST 1 VIEW COMPARISON:  May 16, 2021 FINDINGS: The heart size and mediastinal contours are within normal limits. Breast attenuation artifact is seen overlying the bilateral lung bases. Both lungs are otherwise clear. The visualized skeletal structures are unremarkable. IMPRESSION: No active  disease. Electronically Signed   By: Suzen Dials M.D.   On: 05/27/2023 01:18    Scheduled Meds:  buprenorphine -naloxone   2 tablet Sublingual Daily   enoxaparin  (LOVENOX ) injection  40 mg Subcutaneous Q24H   HYDROmorphone    Intravenous Q4H   ketorolac   15 mg Intravenous Q6H   mometasone -formoterol   2 puff Inhalation BID   senna-docusate  1 tablet Oral BID   Continuous Infusions:  Principal Problem:   Sickle cell anemia with crisis (HCC) Active Problems:   Chronic pain syndrome   Leucocytosis   Thrombocytosis   Obstructive sleep apnea syndrome   GAD (generalized anxiety disorder)   Mild intermittent asthma without complication

## 2023-05-29 NOTE — Plan of Care (Signed)

## 2023-05-29 NOTE — Plan of Care (Signed)
  Problem: Education: Goal: Knowledge of General Education information will improve Description: Including pain rating scale, medication(s)/side effects and non-pharmacologic comfort measures Outcome: Progressing   Problem: Health Behavior/Discharge Planning: Goal: Ability to manage health-related needs will improve Outcome: Progressing   Problem: Clinical Measurements: Goal: Ability to maintain clinical measurements within normal limits will improve Outcome: Progressing Goal: Will remain free from infection Outcome: Progressing Goal: Diagnostic test results will improve Outcome: Progressing Goal: Respiratory complications will improve Outcome: Progressing Goal: Cardiovascular complication will be avoided Outcome: Progressing   Problem: Activity: Goal: Risk for activity intolerance will decrease Outcome: Progressing   Problem: Nutrition: Goal: Adequate nutrition will be maintained Outcome: Progressing   Problem: Coping: Goal: Level of anxiety will decrease Outcome: Progressing   Problem: Pain Management: Goal: General experience of comfort will improve Outcome: Progressing   Problem: Safety: Goal: Ability to remain free from injury will improve Outcome: Progressing   Problem: Skin Integrity: Goal: Risk for impaired skin integrity will decrease Outcome: Progressing

## 2023-05-30 DIAGNOSIS — D5700 Hb-SS disease with crisis, unspecified: Secondary | ICD-10-CM | POA: Diagnosis not present

## 2023-05-30 LAB — CBC
HCT: 21 % — ABNORMAL LOW (ref 36.0–46.0)
Hemoglobin: 7.6 g/dL — ABNORMAL LOW (ref 12.0–15.0)
MCH: 38.2 pg — ABNORMAL HIGH (ref 26.0–34.0)
MCHC: 36.2 g/dL — ABNORMAL HIGH (ref 30.0–36.0)
MCV: 105.5 fL — ABNORMAL HIGH (ref 80.0–100.0)
Platelets: 392 10*3/uL (ref 150–400)
RBC: 1.99 MIL/uL — ABNORMAL LOW (ref 3.87–5.11)
RDW: 17.7 % — ABNORMAL HIGH (ref 11.5–15.5)
WBC: 10.4 10*3/uL (ref 4.0–10.5)
nRBC: 1.5 % — ABNORMAL HIGH (ref 0.0–0.2)

## 2023-05-30 NOTE — Progress Notes (Signed)
 SICKLE CELL SERVICE PROGRESS NOTE  Bailey Reese FMW:[id number] DOB: [birth year] DOA: 05/28/2023 PCP: System, Provider Not In  Assessment/Plan: Principal Problem:   Sickle cell anemia with crisis (HCC) Active Problems:   Chronic pain syndrome   Leucocytosis   Thrombocytosis   Obstructive sleep apnea syndrome   GAD (generalized anxiety disorder)   Mild intermittent asthma without complication  Sickle cell pain crisis: Patient is still having 10 out of 10 pain.  Currently on Dilaudid  PCA, Toradol  and IV fluids.  Patient reported that she has had very slow recovery whenever she has attacks.  We will continue current treatment with reassessment of her pain status Anemia of chronic disease: Continue to monitor H&H Obstructive sleep apnea: Not on CPAP.  Counseling provided. Chronic pain syndrome: Continue chronic pain management. Mild intermittent asthma: Continue empiric treatment with breathing treatment Leukocytosis: Secondary to vaso-occlusive crisis.  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 If 7PM-7AM, please contact night-coverage.  05/30/2023, 5:17 PM  LOS: 2 days   Brief narrative: Bailey Reese is a 18 y.o. female with medical history significant of sickle cell disease, PTSD, depression with anxiety who usually gets her care at [facility] but was seen at [facility] for the last 48 hours where she stayed in the ER with sickle cell pain crisis.  Patient was being managed in the ER for crisis.  She was waiting for a bed to go to [facility] where she normally gets her care however there was no bed available.  Patient was transferred to our care here for continued sickle cell crisis.  Patient reported to have pain similar to her typical sickle cell crisis.  Is mainly in her legs and back.  She has vitals that are  stable.  Hemoglobin is around 8.4's.  She was getting IV Dilaudid  pushes in the ER.  Patient denied any nausea vomiting or diarrhea.  Denied any viral illness.  She has had previous osteomyelitis and multiple episodes of acute chest syndromes.  Also hemorrhagic stroke in 2017.  Patient has chronic pain from her sickle cell disease.  She has been admitted for sickle cell pain management.   Consultants: None  Procedures: None  Antibiotics: None  HPI/Subjective: Patient's pain is still at 10 out of 10 mainly in her legs and back.  She denied any nausea vomiting or diarrhea.  She is slowly responding to treatment.  Objective: Vitals:   05/30/23 0959 05/30/23 1157 05/30/23 1420 05/30/23 1621  BP: 126/68  (!) 116/54   Pulse: 96  78   Resp: 18 14 20 12   Temp: 100.2 F (37.9 C)  98.8 F (37.1 C)   TempSrc: Oral  Oral   SpO2: 100% 100% 98% 100%  Weight:      Height:       Weight change:   Intake/Output Summary (Last 24 hours) at 05/30/2023 1717 Last data filed at 05/30/2023 1513 Gross per 24 hour  Intake 1374 ml  Output --  Net 1374 ml    General: Alert, awake, oriented x3, in no acute distress.  HEENT: [place]/AT PEERL, EOMI Neck: Trachea midline,  no masses, no thyromegal,y no JVD, no carotid bruit OROPHARYNX:  Moist, No exudate/ erythema/lesions.  Heart: Regular rate and rhythm, without murmurs, rubs, gallops, PMI non-displaced, no heaves or thrills on palpation.  Lungs: Clear to auscultation, no wheezing or rhonchi  noted. No increased vocal fremitus resonant to percussion  Abdomen: Soft, nontender, nondistended, positive bowel sounds, no masses no hepatosplenomegaly noted..  Neuro: No focal neurological deficits noted cranial nerves II through XII grossly intact. DTRs 2+ bilaterally upper and lower extremities. Strength 5 out of 5 in bilateral upper and lower extremities. Musculoskeletal: No warm swelling or erythema around joints, no spinal tenderness noted. Psychiatric: Patient  alert and oriented x3, good insight and cognition, good recent to remote recall. Lymph node survey: No cervical axillary or inguinal lymphadenopathy noted.   Data Reviewed: Basic Metabolic Panel: Recent Labs  Lab 05/27/23 0022 05/28/23 1402 05/29/23 0543  NA 137  --  138  K 3.6  --  3.9  CL 107  --  108  CO2 22  --  23  GLUCOSE 95  --  86  BUN 6  --  8  CREATININE 0.51 0.34* 0.34*  CALCIUM 8.8*  --  9.0   Liver Function Tests: Recent Labs  Lab 05/27/23 0022 05/29/23 0543  AST 108* 41  ALT 289* 156*  ALKPHOS 195* 181*  BILITOT 3.3* 1.9*  PROT 7.9 7.4  ALBUMIN 4.0 3.6   No results for input(s): LIPASE, AMYLASE in the last 168 hours. No results for input(s): AMMONIA in the last 168 hours. CBC: Recent Labs  Lab 05/27/23 0022 05/28/23 1402 05/29/23 0543 05/30/23 0549  WBC 14.3* 9.9 9.0 10.4  NEUTROABS 9.3*  --  5.2  --   HGB 8.4* 8.1* 8.3* 7.6*  HCT 24.0* 21.7* 23.5* 21.0*  MCV 109.1* 105.3* 106.8* 105.5*  PLT 411* 374 413* 392   Cardiac Enzymes: No results for input(s): CKTOTAL, CKMB, CKMBINDEX, TROPONINI in the last 168 hours. BNP (last 3 results) No results for input(s): BNP in the last 8760 hours.  ProBNP (last 3 results) No results for input(s): PROBNP in the last 8760 hours.  CBG: No results for input(s): GLUCAP in the last 168 hours.  Recent Results (from the past 240 hours)  Resp panel by RT-PCR (RSV, Flu A&B, Covid) Anterior Nasal Swab     Status: None   Collection Time: 05/27/23 12:22 AM   Specimen: Anterior Nasal Swab  Result Value Ref Range Status   SARS Coronavirus 2 by RT PCR NEGATIVE NEGATIVE Final    Comment: (NOTE) SARS-CoV-2 target nucleic acids are NOT DETECTED.  The SARS-CoV-2 RNA is generally detectable in upper respiratory specimens during the acute phase of infection. The lowest concentration of SARS-CoV-2 viral copies this assay can detect is 138 copies/mL. A negative result does not preclude  SARS-Cov-2 infection and should not be used as the sole basis for treatment or other patient management decisions. A negative result may occur with  improper specimen collection/handling, submission of specimen other than nasopharyngeal swab, presence of viral mutation(s) within the areas targeted by this assay, and inadequate number of viral copies(<138 copies/mL). A negative result must be combined with clinical observations, patient history, and epidemiological information. The expected result is Negative.  Fact Sheet for Patients:  bloggercourse.com  Fact Sheet for Healthcare Providers:  seriousbroker.it  This test is no t yet approved or cleared by the [place]  FDA and  has been authorized for detection and/or diagnosis of SARS-CoV-2 by FDA under an Emergency Use Authorization (EUA). This EUA will remain  in effect (meaning this test can be used) for the duration of the COVID-19 declaration under Section 564(b)(1) of the Act, 21 U.S.C.section 360bbb-3(b)(1), unless the authorization is terminated  or revoked sooner.  Influenza A by PCR NEGATIVE NEGATIVE Final   Influenza B by PCR NEGATIVE NEGATIVE Final    Comment: (NOTE) The Xpert Xpress SARS-CoV-2/FLU/RSV plus assay is intended as an aid in the diagnosis of influenza from Nasopharyngeal swab specimens and should not be used as a sole basis for treatment. Nasal washings and aspirates are unacceptable for Xpert Xpress SARS-CoV-2/FLU/RSV testing.  Fact Sheet for Patients: bloggercourse.com  Fact Sheet for Healthcare Providers: seriousbroker.it  This test is not yet approved or cleared by the [place]  FDA and has been authorized for detection and/or diagnosis of SARS-CoV-2 by FDA under an Emergency Use Authorization (EUA). This EUA will remain in effect (meaning this test can be used) for the duration of  the COVID-19 declaration under Section 564(b)(1) of the Act, 21 U.S.C. section 360bbb-3(b)(1), unless the authorization is terminated or revoked.     Resp Syncytial Virus by PCR NEGATIVE NEGATIVE Final    Comment: (NOTE) Fact Sheet for Patients: bloggercourse.com  Fact Sheet for Healthcare Providers: seriousbroker.it  This test is not yet approved or cleared by the [place]  FDA and has been authorized for detection and/or diagnosis of SARS-CoV-2 by FDA under an Emergency Use Authorization (EUA). This EUA will remain in effect (meaning this test can be used) for the duration of the COVID-19 declaration under Section 564(b)(1) of the Act, 21 U.S.C. section 360bbb-3(b)(1), unless the authorization is terminated or revoked.  Performed at [facility], [address] [place], [place] [address]   Blood culture (routine x 2)     Status: None (Preliminary result)   Collection Time: 05/27/23  5:05 AM   Specimen: BLOOD RIGHT ARM  Result Value Ref Range Status   Specimen Description BLOOD RIGHT ARM  Final   Special Requests   Final    BOTTLES DRAWN AEROBIC AND ANAEROBIC Blood Culture results may not be optimal due to an inadequate volume of blood received in culture bottles   Culture  Setup Time   Final    IN BOTH AEROBIC AND ANAEROBIC BOTTLES GRAM POSITIVE RODS CONSISTENT WITH PREVIOUS RESULT CRITICAL RESULT CALLED TO, READ BACK BY AND VERIFIED WITH: MADISON HUNT @ 1850 05/27/23 LD Performed at [facility], [address] [place], [place] [address]    Culture GRAM POSITIVE RODS  Final   Report Status PENDING  Incomplete  Group A Strep by PCR ([facility] Only)     Status: None   Collection Time: 05/27/23  5:07 AM   Specimen: Throat; Sterile Swab  Result Value Ref Range Status   Group A Strep by PCR NOT DETECTED NOT DETECTED Final    Comment: Performed at [facility], [address] [place],  [place] [address]  Blood culture (routine x 2)     Status: Abnormal (Preliminary result)   Collection Time: 05/27/23  5:07 AM   Specimen: BLOOD RIGHT ARM  Result Value Ref Range Status   Specimen Description   Final    BLOOD RIGHT ARM Performed at [facility], [address], [place], [place] [address]    Special Requests   Final    BOTTLES DRAWN AEROBIC AND ANAEROBIC Blood Culture results may not be optimal due to an inadequate volume of blood received in culture bottles Performed at [facility], [address] [place], [place] [address]    Culture  Setup Time   Final    IN BOTH AEROBIC AND ANAEROBIC BOTTLES GRAM POSITIVE RODS CRITICAL RESULT CALLED TO, READ BACK BY AND VERIFIED  WITH: MADISON HUNT @ 1850 05/27/23 LFD Performed at [facility], [address] [place], [place] [address]    Culture (A)  Final    BACILLUS SPECIES Standardized susceptibility testing for this organism is not available. CULTURE REINCUBATED FOR BETTER GROWTH Performed at [facility], [address] [place], [place] [address]    Report Status PENDING  Incomplete     Studies: CT Angio Chest PE W and/or Wo Contrast Result Date: 05/27/2023 CLINICAL DATA:  18 year old with history of sickle cell disease, currently in sickle cell crisis with generalized body pain. EXAM: CT ANGIOGRAPHY CHEST WITH CONTRAST TECHNIQUE: Multidetector CT imaging of the chest was performed using the standard protocol during bolus administration of intravenous contrast. Multiplanar CT image reconstructions and MIPs were obtained to evaluate the vascular anatomy. RADIATION DOSE REDUCTION: This exam was performed according to the departmental dose-optimization program which includes automated exposure control, adjustment of the mA and/or kV according to patient size and/or use of iterative reconstruction technique. CONTRAST:  75mL OMNIPAQUE  IOHEXOL  350 MG/ML SOLN COMPARISON:  Portable chest from today, PA and  lateral chest 05/16/2021. No prior cross-sectional imaging. FINDINGS: Cardiovascular: The aorta and great vessels are normal. The pulmonary trunk is prominent measuring 3.0 cm suggesting arterial hypertension, but no arterial embolism is seen. There is mild-to-moderate panchamber cardiomegaly without findings of acute right heart strain. Small pericardial effusion. Pulmonary veins are normal caliber. Mediastinum/Nodes: There are prominent palatine tonsils abutting the uvula, partially visible on the first few slices. Correlate clinically for pharyngitis. Thyroid gland and axillary spaces are unremarkable. There is residual thymus in the anterior mediastinum. No intrathoracic adenopathy is seen. The thoracic trachea, thoracic esophagus and main bronchi are unremarkable. Lungs/Pleura: There are symmetric trace pleural effusions. There is a low inspiration on exam. Interstitial and patchy opacities of the bilateral lower lobes could be due to pneumonia, aspiration, or low inspiration. In the upper lobes, other is mosaic attenuation which can be seen with small airways disease with air trapping, but no focal infiltrate. The right middle lobe is clear. Upper Abdomen: Status post cholecystectomy. No acute findings. There is a nonspecific 1 cm hypodense lesion noted in the superior spleen, another more inferiorly measuring 1.8 cm. Musculoskeletal: Mild thoracic kyphodextroscoliosis. There are central endplate defects in some of the thoracic vertebrae consistent with history of sickle cell disease. 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 No other significant osseous findings.  No chest wall mass is seen. Review of the MIP images confirms the above findings. IMPRESSION: 1. No evidence of arterial embolus. 2. Prominent pulmonary trunk suggesting arterial hypertension. 3. Cardiomegaly with small pericardial effusion. 4. Trace pleural effusions. 5. Interstitial and patchy opacities in the lower lobes which  could be due to pneumonia, aspiration, or low inspiration. 6. Mosaic attenuation in the upper lobes which can be seen with small airways disease with air trapping. 7. Prominent palatine tonsils abutting the uvula. Correlate clinically for pharyngitis. 8. Nonspecific 1 cm and 1.8 cm hypodense lesions in the spleen. Follow-up MRI with and without contrast recommended. 9. Avascular necrosis superior right humeral head. 10. Central endplate defects in some of the thoracic vertebrae consistent with history of sickle cell disease. Electronically Signed   By: Francis Quam M.D.   On: 05/27/2023 04:01   DG Chest Portable 1 View Result Date: 05/27/2023 CLINICAL DATA:  Sickle cell crisis with generalized body pain. EXAM: PORTABLE CHEST 1 VIEW COMPARISON:  May 16, 2021 FINDINGS: The heart size and mediastinal contours are within  normal limits. Breast attenuation artifact is seen overlying the bilateral lung bases. Both lungs are otherwise clear. The visualized skeletal structures are unremarkable. IMPRESSION: No active disease. Electronically Signed   By: Suzen Dials M.D.   On: 05/27/2023 01:18    Scheduled Meds:  buprenorphine -naloxone   2 tablet Sublingual Daily   enoxaparin  (LOVENOX ) injection  40 mg Subcutaneous Q24H   HYDROmorphone    Intravenous Q4H   ketorolac   15 mg Intravenous Q6H   mometasone -formoterol   2 puff Inhalation BID   senna-docusate  1 tablet Oral BID   Continuous Infusions:  sodium chloride  10 mL/hr at 05/30/23 0400    Principal Problem:   Sickle cell anemia with crisis (HCC) Active Problems:   Chronic pain syndrome   Leucocytosis   Thrombocytosis   Obstructive sleep apnea syndrome   GAD (generalized anxiety disorder)   Mild intermittent asthma without complication

## 2023-05-30 NOTE — Plan of Care (Signed)

## 2023-05-30 NOTE — Plan of Care (Signed)

## 2023-05-31 ENCOUNTER — Inpatient Hospital Stay (HOSPITAL_COMMUNITY): Payer: Medicaid Other

## 2023-05-31 DIAGNOSIS — D5700 Hb-SS disease with crisis, unspecified: Secondary | ICD-10-CM | POA: Diagnosis not present

## 2023-05-31 LAB — CULTURE, BLOOD (ROUTINE X 2): Culture  Setup Time: NO GROWTH

## 2023-05-31 LAB — GLUCOSE, CAPILLARY: Glucose-Capillary: 108 mg/dL — ABNORMAL HIGH (ref 70–99)

## 2023-05-31 MED ORDER — HYDROMORPHONE BOLUS VIA INFUSION
1.0000 mg | Freq: Once | INTRAVENOUS | Status: AC
Start: 2023-05-31 — End: 2023-05-31
  Administered 2023-05-31: 1 mg via INTRAVENOUS
  Filled 2023-05-31: qty 1

## 2023-05-31 MED ORDER — HYDROMORPHONE BOLUS VIA INFUSION
1.0000 mg | INTRAVENOUS | Status: AC | PRN
  Administered 2023-06-01 – 2023-06-04 (×2): 1 mg via INTRAVENOUS

## 2023-05-31 MED ORDER — LORAZEPAM 2 MG/ML IJ SOLN
INTRAMUSCULAR | Status: AC
  Filled 2023-05-31: qty 1

## 2023-05-31 MED ORDER — PREGABALIN 75 MG PO CAPS
75.0000 mg | ORAL_CAPSULE | Freq: Three times a day (TID) | ORAL | Status: DC
  Administered 2023-05-31 – 2023-06-08 (×24): 75 mg via ORAL
  Filled 2023-05-31 (×24): qty 1

## 2023-05-31 MED ORDER — SODIUM CHLORIDE 0.9 % IV SOLN: INTRAVENOUS | Status: AC | PRN

## 2023-05-31 MED ORDER — BUPRENORPHINE HCL-NALOXONE HCL 8-2 MG SL SUBL
1.0000 | SUBLINGUAL_TABLET | Freq: Two times a day (BID) | SUBLINGUAL | Status: DC
  Filled 2023-05-31: qty 1

## 2023-05-31 MED ORDER — LORAZEPAM 2 MG/ML IJ SOLN
0.5000 mg | Freq: Once | INTRAMUSCULAR | Status: AC
  Administered 2023-05-31: 0.5 mg via INTRAVENOUS

## 2023-05-31 MED ORDER — OXYCODONE HCL 5 MG PO TABS
10.0000 mg | ORAL_TABLET | Freq: Four times a day (QID) | ORAL | Status: DC | PRN
  Administered 2023-05-31: 10 mg via ORAL
  Filled 2023-05-31: qty 2

## 2023-05-31 MED ORDER — BUPRENORPHINE HCL-NALOXONE HCL 8-2 MG SL SUBL: 1.0000 | SUBLINGUAL_TABLET | Freq: Two times a day (BID) | SUBLINGUAL | Status: DC

## 2023-05-31 MED ORDER — METHYLPREDNISOLONE SODIUM SUCC 40 MG IJ SOLR
40.0000 mg | Freq: Once | INTRAMUSCULAR | Status: AC
  Administered 2023-05-31: 40 mg via INTRAVENOUS
  Filled 2023-05-31: qty 1

## 2023-05-31 NOTE — Progress Notes (Signed)
 SICKLE CELL SERVICE PROGRESS NOTE  Bailey Reese ZOX:[id number] DOB: 07-21-2005 DOA: 05/28/2023 PCP: System, Provider Not In  Assessment/Plan: Principal Problem:   Sickle cell anemia with crisis (HCC) Active Problems:   Chronic pain syndrome   Leucocytosis   Thrombocytosis   Obstructive sleep apnea syndrome   GAD (generalized anxiety disorder)   Mild intermittent asthma without complication  Sickle cell pain crisis: Patient is still having 9 out of 10 pain.  Currently on Dilaudid  PCA, Toradol  and IV fluids.  Patient reported that she has had very slow recovery whenever she has attacks.  We will continue current treatment with reassessment of her pain status. I will restart oral home medications to fast track her recovery. Anemia of chronic disease: Continue to monitor H&H Obstructive sleep apnea: Not on CPAP.  Counseling provided. Chronic pain syndrome: Continue chronic pain management. Mild intermittent asthma: Continue empiric treatment with breathing treatment Leukocytosis: Secondary to vaso-occlusive crisis.  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 If 7PM-7AM, please contact night-coverage.  05/31/2023, 1:23 PM  LOS: 3 days   Brief narrative: Bailey Reese is a 18 y.o. female with medical history significant of sickle cell disease, PTSD, depression with anxiety who usually gets her care at [facility] but was seen at [facility] for the last 48 hours where she stayed in the ER with sickle cell pain crisis.  Patient was being managed in the ER for crisis.  She was waiting for a bed to go to [facility] where she normally gets her care however there was no bed available.  Patient was transferred to our care here for continued sickle cell crisis.  Patient reported to have pain similar to her typical sickle cell  crisis.  Is mainly in her legs and back.  She has vitals that are stable.  Hemoglobin is around 8.4's.  She was getting IV Dilaudid  pushes in the ER.  Patient denied any nausea vomiting or diarrhea.  Denied any viral illness.  She has had previous osteomyelitis and multiple episodes of acute chest syndromes.  Also hemorrhagic stroke in 2017.  Patient has chronic pain from her sickle cell disease.  She has been admitted for sickle cell pain management.   Consultants: None  Procedures: None  Antibiotics: None  HPI/Subjective: Patient's pain is still at 9 out of 10 mainly in her legs and back.  She denied any nausea vomiting or diarrhea.  She is slowly responding to treatment.  Objective: Vitals:   05/31/23 1010 05/31/23 1143 05/31/23 1216 05/31/23 1301  BP: 118/61   (!) 108/56  Pulse: 84   80  Resp: 16 18 18 16   Temp: 99.4 F (37.4 C)   98.3 F (36.8 C)  TempSrc: Oral   Oral  SpO2: 97%   97%  Weight:      Height:       Weight change:   Intake/Output Summary (Last 24 hours) at 05/31/2023 1323 Last data filed at 05/31/2023 0855 Gross per 24 hour  Intake 1362.84 ml  Output 1200 ml  Net 162.84 ml    General: Alert, awake, oriented x3, in no acute distress.  HEENT: [place]/AT PEERL, EOMI Neck: Trachea midline,  no masses, no thyromegal,y no JVD, no carotid bruit OROPHARYNX:  Moist, No exudate/ erythema/lesions.  Heart: Regular rate and rhythm, without murmurs, rubs, gallops, PMI non-displaced, no heaves or  thrills on palpation.  Lungs: Clear to auscultation, no wheezing or rhonchi noted. No increased vocal fremitus resonant to percussion  Abdomen: Soft, nontender, nondistended, positive bowel sounds, no masses no hepatosplenomegaly noted..  Neuro: No focal neurological deficits noted cranial nerves II through XII grossly intact. DTRs 2+ bilaterally upper and lower extremities. Strength 5 out of 5 in bilateral upper and lower extremities. Musculoskeletal: No warm swelling or erythema  around joints, no spinal tenderness noted. Psychiatric: Patient alert and oriented x3, good insight and cognition, good recent to remote recall. Lymph node survey: No cervical axillary or inguinal lymphadenopathy noted.   Data Reviewed: Basic Metabolic Panel: Recent Labs  Lab 05/27/23 0022 05/28/23 1402 05/29/23 0543  NA 137  --  138  K 3.6  --  3.9  CL 107  --  108  CO2 22  --  23  GLUCOSE 95  --  86  BUN 6  --  8  CREATININE 0.51 0.34* 0.34*  CALCIUM 8.8*  --  9.0   Liver Function Tests: Recent Labs  Lab 05/27/23 0022 05/29/23 0543  AST 108* 41  ALT 289* 156*  ALKPHOS 195* 181*  BILITOT 3.3* 1.9*  PROT 7.9 7.4  ALBUMIN 4.0 3.6   No results for input(s): "LIPASE", "AMYLASE" in the last 168 hours. No results for input(s): "AMMONIA" in the last 168 hours. CBC: Recent Labs  Lab 05/27/23 0022 05/28/23 1402 05/29/23 0543 05/30/23 0549  WBC 14.3* 9.9 9.0 10.4  NEUTROABS 9.3*  --  5.2  --   HGB 8.4* 8.1* 8.3* 7.6*  HCT 24.0* 21.7* 23.5* 21.0*  MCV 109.1* 105.3* 106.8* 105.5*  PLT 411* 374 413* 392   Cardiac Enzymes: No results for input(s): "CKTOTAL", "CKMB", "CKMBINDEX", "TROPONINI" in the last 168 hours. BNP (last 3 results) No results for input(s): "BNP" in the last 8760 hours.  ProBNP (last 3 results) No results for input(s): "PROBNP" in the last 8760 hours.  CBG: No results for input(s): "GLUCAP" in the last 168 hours.  Recent Results (from the past 240 hours)  Resp panel by RT-PCR (RSV, Flu A&B, Covid) Anterior Nasal Swab     Status: None   Collection Time: 05/27/23 12:22 AM   Specimen: Anterior Nasal Swab  Result Value Ref Range Status   SARS Coronavirus 2 by RT PCR NEGATIVE NEGATIVE Final    Comment: (NOTE) SARS-CoV-2 target nucleic acids are NOT DETECTED.  The SARS-CoV-2 RNA is generally detectable in upper respiratory specimens during the acute phase of infection. The lowest concentration of SARS-CoV-2 viral copies this assay can detect  is 138 copies/mL. A negative result does not preclude SARS-Cov-2 infection and should not be used as the sole basis for treatment or other patient management decisions. A negative result may occur with  improper specimen collection/handling, submission of specimen other than nasopharyngeal swab, presence of viral mutation(s) within the areas targeted by this assay, and inadequate number of viral copies(<138 copies/mL). A negative result must be combined with clinical observations, patient history, and epidemiological information. The expected result is Negative.  Fact Sheet for Patients:  BloggerCourse.com  Fact Sheet for Healthcare Providers:  SeriousBroker.it  This test is no t yet approved or cleared by the [place]  FDA and  has been authorized for detection and/or diagnosis of SARS-CoV-2 by FDA under an Emergency Use Authorization (EUA). This EUA will remain  in effect (meaning this test can be used) for the duration of the COVID-19 declaration under Section 564(b)(1) of the Act, 21 U.S.C.section  360bbb-3(b)(1), unless the authorization is terminated  or revoked sooner.       Influenza A by PCR NEGATIVE NEGATIVE Final   Influenza B by PCR NEGATIVE NEGATIVE Final    Comment: (NOTE) The Xpert Xpress SARS-CoV-2/FLU/RSV plus assay is intended as an aid in the diagnosis of influenza from Nasopharyngeal swab specimens and should not be used as a sole basis for treatment. Nasal washings and aspirates are unacceptable for Xpert Xpress SARS-CoV-2/FLU/RSV testing.  Fact Sheet for Patients: BloggerCourse.com  Fact Sheet for Healthcare Providers: SeriousBroker.it  This test is not yet approved or cleared by the [place]  FDA and has been authorized for detection and/or diagnosis of SARS-CoV-2 by FDA under an Emergency Use Authorization (EUA). This EUA will remain in effect  (meaning this test can be used) for the duration of the COVID-19 declaration under Section 564(b)(1) of the Act, 21 U.S.C. section 360bbb-3(b)(1), unless the authorization is terminated or revoked.     Resp Syncytial Virus by PCR NEGATIVE NEGATIVE Final    Comment: (NOTE) Fact Sheet for Patients: BloggerCourse.com  Fact Sheet for Healthcare Providers: SeriousBroker.it  This test is not yet approved or cleared by the [place]  FDA and has been authorized for detection and/or diagnosis of SARS-CoV-2 by FDA under an Emergency Use Authorization (EUA). This EUA will remain in effect (meaning this test can be used) for the duration of the COVID-19 declaration under Section 564(b)(1) of the Act, 21 U.S.C. section 360bbb-3(b)(1), unless the authorization is terminated or revoked.  Performed at [facility], [address] [place], [place] [address]   Blood culture (routine x 2)     Status: Abnormal   Collection Time: 05/27/23  5:05 AM   Specimen: BLOOD RIGHT ARM  Result Value Ref Range Status   Specimen Description   Final    BLOOD RIGHT ARM Performed at [facility], [address] [place], [place] [address]    Special Requests   Final    BOTTLES DRAWN AEROBIC AND ANAEROBIC Blood Culture results may not be optimal due to an inadequate volume of blood received in culture bottles Performed at [facility], [address], [place], [place] [address]    Culture  Setup Time   Final    IN BOTH AEROBIC AND ANAEROBIC BOTTLES GRAM POSITIVE RODS CONSISTENT WITH PREVIOUS RESULT CRITICAL RESULT CALLED TO, READ BACK BY AND VERIFIED WITH: MADISON HUNT @ 1850 05/27/23 LD Performed at [facility], [address] [place], [place] [address]    Culture (A)  Final    ACTINOMYCES SPECIES LACTOBACILLUS ACIDOPHILUS Standardized susceptibility testing for this organism is not available. Performed at  [facility], [address], [place], [place] [address]    Report Status 05/31/2023 FINAL  Final  Group A Strep by PCR ([facility] Only)     Status: None   Collection Time: 05/27/23  5:07 AM   Specimen: Throat; Sterile Swab  Result Value Ref Range Status   Group A Strep by PCR NOT DETECTED NOT DETECTED Final    Comment: Performed at [facility], [address] [place], [place] [address]  Blood culture (routine x 2)     Status: Abnormal   Collection Time: 05/27/23  5:07 AM   Specimen: BLOOD RIGHT ARM  Result Value Ref Range Status   Specimen Description   Final    BLOOD RIGHT ARM Performed at [facility], [address], [place], [place] [address]    Special Requests  Final    BOTTLES DRAWN AEROBIC AND ANAEROBIC Blood Culture results may not be optimal due to an inadequate volume of blood received in culture bottles Performed at [facility], [address] [place], [place] [address]    Culture  Setup Time   Final    IN BOTH AEROBIC AND ANAEROBIC BOTTLES GRAM POSITIVE RODS CRITICAL RESULT CALLED TO, READ BACK BY AND VERIFIED WITH: MADISON HUNT @ 1850 05/27/23 LFD Performed at [facility], [address] [place], [place] [address]    Culture (A)  Final    BACILLUS SPECIES ACTINOMYCES SPECIES Standardized susceptibility testing for this organism is not available. Performed at [facility], [address], [place], [place] [address]    Report Status 05/31/2023 FINAL  Final     Studies: CT Angio Chest PE W and/or Wo Contrast Result Date: 05/27/2023 CLINICAL DATA:  18 year old with history of sickle cell disease, currently in sickle cell crisis with generalized body pain. EXAM: CT ANGIOGRAPHY CHEST WITH CONTRAST TECHNIQUE: Multidetector CT imaging of the chest was performed using the standard protocol during bolus administration of intravenous contrast. Multiplanar CT image reconstructions and MIPs were obtained to evaluate the  vascular anatomy. RADIATION DOSE REDUCTION: This exam was performed according to the departmental dose-optimization program which includes automated exposure control, adjustment of the mA and/or kV according to patient size and/or use of iterative reconstruction technique. CONTRAST:  75mL OMNIPAQUE  IOHEXOL  350 MG/ML SOLN COMPARISON:  Portable chest from today, PA and lateral chest 05/16/2021. No prior cross-sectional imaging. FINDINGS: Cardiovascular: The aorta and great vessels are normal. The pulmonary trunk is prominent measuring 3.0 cm suggesting arterial hypertension, but no arterial embolism is seen. There is mild-to-moderate panchamber cardiomegaly without findings of acute right heart strain. Small pericardial effusion. Pulmonary veins are normal caliber. Mediastinum/Nodes: There are prominent palatine tonsils abutting the uvula, partially visible on the first few slices. Correlate clinically for pharyngitis. Thyroid gland and axillary spaces are unremarkable. There is residual thymus in the anterior mediastinum. No intrathoracic adenopathy is seen. The thoracic trachea, thoracic esophagus and main bronchi are unremarkable. Lungs/Pleura: There are symmetric trace pleural effusions. There is a low inspiration on exam. Interstitial and patchy opacities of the bilateral lower lobes could be due to pneumonia, aspiration, or low inspiration. In the upper lobes, other is mosaic attenuation which can be seen with small airways disease with air trapping, but no focal infiltrate. The right middle lobe is clear. Upper Abdomen: Status post cholecystectomy. No acute findings. There is a nonspecific 1 cm hypodense lesion noted in the superior spleen, another more inferiorly measuring 1.8 cm. Musculoskeletal: Mild thoracic kyphodextroscoliosis. There are central endplate defects in some of the thoracic vertebrae consistent with history of sickle cell disease. 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 No  other significant osseous findings.  No chest wall mass is seen. Review of the MIP images confirms the above findings. IMPRESSION: 1. No evidence of arterial embolus. 2. Prominent pulmonary trunk suggesting arterial hypertension. 3. Cardiomegaly with small pericardial effusion. 4. Trace pleural effusions. 5. Interstitial and patchy opacities in the lower lobes which could be due to pneumonia, aspiration, or low inspiration. 6. Mosaic attenuation in the upper lobes which can be seen with small airways disease with air trapping. 7. Prominent palatine tonsils abutting the uvula. Correlate clinically for pharyngitis. 8. Nonspecific 1 cm and 1.8 cm hypodense lesions in the spleen. Follow-up MRI with and without contrast recommended. 9. Avascular necrosis superior right humeral head. 10. Central  endplate defects in some of the thoracic vertebrae consistent with history of sickle cell disease. Electronically Signed   By: Denman Fischer M.D.   On: 05/27/2023 04:01   DG Chest Portable 1 View Result Date: 05/27/2023 CLINICAL DATA:  Sickle cell crisis with generalized body pain. EXAM: PORTABLE CHEST 1 VIEW COMPARISON:  May 16, 2021 FINDINGS: The heart size and mediastinal contours are within normal limits. Breast attenuation artifact is seen overlying the bilateral lung bases. Both lungs are otherwise clear. The visualized skeletal structures are unremarkable. IMPRESSION: No active disease. Electronically Signed   By: Virgle Grime M.D.   On: 05/27/2023 01:18    Scheduled Meds:  buprenorphine -naloxone   2 tablet Sublingual Daily   enoxaparin  (LOVENOX ) injection  40 mg Subcutaneous Q24H   HYDROmorphone    Intravenous Q4H   ketorolac   15 mg Intravenous Q6H   mometasone -formoterol   2 puff Inhalation BID   senna-docusate  1 tablet Oral BID   Continuous Infusions:  sodium chloride  10 mL/hr at 05/31/23 0400    Principal Problem:   Sickle cell anemia with crisis [facility]) Active Problems:   Chronic pain  syndrome   Leucocytosis   Thrombocytosis   Obstructive sleep apnea syndrome   GAD (generalized anxiety disorder)   Mild intermittent asthma without complication

## 2023-05-31 NOTE — Plan of Care (Signed)

## 2023-05-31 NOTE — Progress Notes (Addendum)
 Rapid Response Event Note   Reason for Call :  Sudden onset shortness of breath and tachypnea  Initial Focused Assessment:  Patient is A&O x4, tachypneic at 50, baseline supplemental O2 on at 3L [place] with spO2 of 98-100%. Patient unable to speak in full sentences, states she is unable to slow her breathing even with therapeutic techniques. Lung sounds are clear throughout, patient is in NAD, calm in bed on phone. Per chart review, patient does have hx of anxiety and asthma.  Interventions:  A. Lorre Rosin, NP notified. 0.5 mg ativan , 1 mg dilaudid  bolus via PCA, 40 mg solumedrol, 2.5 mg albuterol . STAT CXR also ordered.   Plan of Care:  Pain control and supportive measures. Per NP, will add PRN bolus dilaudid  for acute symptoms. Patient states this is NOT the first time this has occurred, and usually happens when her pain is not well controlled. Patient has suboxone  but is not taking it d/t adverse side effects. PRN oxy is available if needed. Will look for CXR results.   Event Summary:   MD Notified: 2223 Call Time: 2151 Arrival Time: 2235 End Time: 2255  Daivd Dub, RN

## 2023-06-01 DIAGNOSIS — D5700 Hb-SS disease with crisis, unspecified: Secondary | ICD-10-CM | POA: Diagnosis not present

## 2023-06-01 MED ORDER — OXYCODONE HCL 5 MG PO TABS
10.0000 mg | ORAL_TABLET | Freq: Four times a day (QID) | ORAL | Status: DC
  Administered 2023-06-01 – 2023-06-03 (×9): 10 mg via ORAL
  Filled 2023-06-01 (×9): qty 2

## 2023-06-01 MED ORDER — LORAZEPAM 2 MG/ML IJ SOLN: 0.5000 mg | INTRAMUSCULAR | Status: DC | PRN

## 2023-06-01 NOTE — Plan of Care (Signed)

## 2023-06-01 NOTE — Progress Notes (Addendum)
    Patient Name: Bailey Reese           DOB: [birth year]  MRN: [id number]      Admission Date: 05/28/2023  Attending Provider: Rometta Emery, MD  Primary Diagnosis: Sickle cell anemia with crisis [facility])   Level of care: Med-Surg    CROSS COVER NOTE   Date of Service   06/01/2023   Bailey Reese, 18 y.o. female, was admitted on 05/28/2023 for Sickle cell anemia with crisis [facility]).    HPI/Events of Note   Called to bedside by rapid response RN.   Upon arrival, patient was noted to be awake and hyperventilating.   Currently receiving neb treatment.  Appears anxious.  Unable to communicate in full sentences.  Per chart, has history of anxiety and mild intermittent asthma.  Denies smoking.  States she is having generalized pain 10/10.  Respiratory: Bilaterally clear, no wheezing, no crackles.  Tachypneic, using abdominal muscles.  Cardiovascular: Regular rate and rhythm. No extremity edema. No carotid bruits.  Abdomen: Abdomen is soft and nontender.  Positive bowel sounds in all quadrants.    Addendum: Patient improving shortly after receiving combination of IV Ativan and Dilaudid bolus.  She is now able to communicate better and states that she has had "multiple "episodes like this when pain is "out of control."  Patient to continue PCA therapy, additional Dilaudid bolus ordered if needed later tonight. Event appears to be related to pain crisis, however also underlying anxiety.   Mother is at bedside.  Patient has no further questions and appears more comfortable than prior.   Interventions/ Plan   Neb treatment as needed IV Ativan Chest x-ray Solu-Medrol 1 mg IV Dilaudid push, PRN        Anthoney Harada, DNP, ACNPC- AG [facility]

## 2023-06-01 NOTE — Progress Notes (Signed)
   06/01/23 1334  TOC Brief Assessment  Insurance and Status Reviewed  Patient has primary care physician Yes  Home environment has been reviewed Yes  Prior level of function: Independent  Prior/Current Home Services No current home services  Social Drivers of Health Review SDOH reviewed no interventions necessary  Readmission risk has been reviewed Yes  Transition of care needs no transition of care needs at this time

## 2023-06-01 NOTE — Progress Notes (Signed)
SICKLE CELL SERVICE PROGRESS NOTE  Bailey Reese RUE:[id number] DOB: 09/21/2005 DOA: 05/28/2023 PCP: System, Provider Not In  Assessment/Plan: Principal Problem:   Sickle cell anemia with crisis (HCC) Active Problems:   Chronic pain syndrome   Leucocytosis   Thrombocytosis   Obstructive sleep apnea syndrome   GAD (generalized anxiety disorder)   Mild intermittent asthma without complication  Sickle cell pain crisis: Patient is still having 10 out of 10 pain.  Currently on Dilaudid PCA, Toradol and IV fluids.  Patient reported that she has had very slow recovery whenever she has attacks.  We will continue current treatment with reassessment of her pain status. I will restart oral home medications to fast track her recovery.  I am concerned that patient's pain is likely related to some psychosocial components.  She had a rapid response last night.  It is very unusual for her not to have any improvement.  She is refusing the Suboxone which was her long-acting prior to coming in.  I have scheduled her short acting medications here in the hospital.  She needs something for long-acting in order to get off PCA and for her chronic pain. Anemia of chronic disease: Continue to monitor H&H Obstructive sleep apnea: Not on CPAP.  Counseling provided. Chronic pain syndrome: Patient appears to have significant chronic pain.  She was on Suboxone and oxycodone at home.  She is refusing the Suboxone here.  I have discussed with her that she will need to get something for long-acting.  She apparently was on Xtampza before but was discontinued by her physicians.  I am trying to get information from care everywhere to determine real reason why patient was taken off Xtampza.  Continue chronic pain management. Mild intermittent asthma: Continue empiric treatment with breathing treatment Leukocytosis: Secondary to vaso-occlusive crisis.  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 If 7PM-7AM, please contact night-coverage.  06/01/2023, 6:05 AM  LOS: 4 days   Brief narrative: Bailey Reese is a 18 y.o. female with medical history significant of sickle cell disease, PTSD, depression with anxiety who usually gets her care at [facility] but was seen at [facility] for the last 48 hours where she stayed in the ER with sickle cell pain crisis.  Patient was being managed in the ER for crisis.  She was waiting for a bed to go to [facility] where she normally gets her care however there was no bed available.  Patient was transferred to our care here for continued sickle cell crisis.  Patient reported to have pain similar to her typical sickle cell crisis.  Is mainly in her legs and back.  She has vitals that are stable.  Hemoglobin is around 8.4's.  She was getting IV Dilaudid pushes in the ER.  Patient denied any nausea vomiting or diarrhea.  Denied any viral illness.  She has had previous osteomyelitis and multiple episodes of acute chest syndromes.  Also hemorrhagic stroke in 2017.  Patient has chronic pain from her sickle cell disease.  She has been admitted for sickle cell pain management.   Consultants: None  Procedures: None  Antibiotics: None  HPI/Subjective: Patient's pain is still at 10 out of 10 mainly in her legs and back.  She denied any nausea vomiting or diarrhea.  She is slowly responding to treatment.  Objective: Vitals:   05/31/23  2353 06/01/23 0011 06/01/23 0246 06/01/23 0440  BP:  (!) 151/88  120/61  Pulse:    88  Resp: 12 14 20  (!) 23  Temp:  97.9 F (36.6 C)  97.6 F (36.4 C)  TempSrc:  Oral  Oral  SpO2: 97% 97% 98% 99%  Weight:      Height:       Weight change:   Intake/Output Summary (Last 24 hours) at 06/01/2023 0454 Last data filed at 05/31/2023 1301 Gross per 24 hour  Intake 360 ml  Output --  Net  360 ml    General: Alert, awake, oriented x3, in no acute distress.  HEENT: /AT PEERL, EOMI Neck: Trachea midline,  no masses, no thyromegal,y no JVD, no carotid bruit OROPHARYNX:  Moist, No exudate/ erythema/lesions.  Heart: Regular rate and rhythm, without murmurs, rubs, gallops, PMI non-displaced, no heaves or thrills on palpation.  Lungs: Clear to auscultation, no wheezing or rhonchi noted. No increased vocal fremitus resonant to percussion  Abdomen: Soft, nontender, nondistended, positive bowel sounds, no masses no hepatosplenomegaly noted..  Neuro: No focal neurological deficits noted cranial nerves II through XII grossly intact. DTRs 2+ bilaterally upper and lower extremities. Strength 5 out of 5 in bilateral upper and lower extremities. Musculoskeletal: No warm swelling or erythema around joints, no spinal tenderness noted. Psychiatric: Patient alert and oriented x3, good insight and cognition, good recent to remote recall. Lymph node survey: No cervical axillary or inguinal lymphadenopathy noted.   Data Reviewed: Basic Metabolic Panel: Recent Labs  Lab 05/27/23 0022 05/28/23 1402 05/29/23 0543  NA 137  --  138  K 3.6  --  3.9  CL 107  --  108  CO2 22  --  23  GLUCOSE 95  --  86  BUN 6  --  8  CREATININE 0.51 0.34* 0.34*  CALCIUM 8.8*  --  9.0   Liver Function Tests: Recent Labs  Lab 05/27/23 0022 05/29/23 0543  AST 108* 41  ALT 289* 156*  ALKPHOS 195* 181*  BILITOT 3.3* 1.9*  PROT 7.9 7.4  ALBUMIN 4.0 3.6   No results for input(s): "LIPASE", "AMYLASE" in the last 168 hours. No results for input(s): "AMMONIA" in the last 168 hours. CBC: Recent Labs  Lab 05/27/23 0022 05/28/23 1402 05/29/23 0543 05/30/23 0549  WBC 14.3* 9.9 9.0 10.4  NEUTROABS 9.3*  --  5.2  --   HGB 8.4* 8.1* 8.3* 7.6*  HCT 24.0* 21.7* 23.5* 21.0*  MCV 109.1* 105.3* 106.8* 105.5*  PLT 411* 374 413* 392   Cardiac Enzymes: No results for input(s): "CKTOTAL", "CKMB", "CKMBINDEX",  "TROPONINI" in the last 168 hours. BNP (last 3 results) No results for input(s): "BNP" in the last 8760 hours.  ProBNP (last 3 results) No results for input(s): "PROBNP" in the last 8760 hours.  CBG: Recent Labs  Lab 05/31/23 2216  GLUCAP 108*    Recent Results (from the past 240 hours)  Resp panel by RT-PCR (RSV, Flu A&B, Covid) Anterior Nasal Swab     Status: None   Collection Time: 05/27/23 12:22 AM   Specimen: Anterior Nasal Swab  Result Value Ref Range Status   SARS Coronavirus 2 by RT PCR NEGATIVE NEGATIVE Final    Comment: (NOTE) SARS-CoV-2 target nucleic acids are NOT DETECTED.  The SARS-CoV-2 RNA is generally detectable in upper respiratory specimens during the acute phase of infection. The lowest concentration of SARS-CoV-2 viral copies this assay can detect is 138 copies/mL. A negative result does not  preclude SARS-Cov-2 infection and should not be used as the sole basis for treatment or other patient management decisions. A negative result may occur with  improper specimen collection/handling, submission of specimen other than nasopharyngeal swab, presence of viral mutation(s) within the areas targeted by this assay, and inadequate number of viral copies(<138 copies/mL). A negative result must be combined with clinical observations, patient history, and epidemiological information. The expected result is Negative.  Fact Sheet for Patients:  BloggerCourse.com  Fact Sheet for Healthcare Providers:  SeriousBroker.it  This test is no t yet approved or cleared by the [place] FDA and  has been authorized for detection and/or diagnosis of SARS-CoV-2 by FDA under an Emergency Use Authorization (EUA). This EUA will remain  in effect (meaning this test can be used) for the duration of the COVID-19 declaration under Section 564(b)(1) of the Act, 21 U.S.C.section 360bbb-3(b)(1), unless the authorization is  terminated  or revoked sooner.       Influenza A by PCR NEGATIVE NEGATIVE Final   Influenza B by PCR NEGATIVE NEGATIVE Final    Comment: (NOTE) The Xpert Xpress SARS-CoV-2/FLU/RSV plus assay is intended as an aid in the diagnosis of influenza from Nasopharyngeal swab specimens and should not be used as a sole basis for treatment. Nasal washings and aspirates are unacceptable for Xpert Xpress SARS-CoV-2/FLU/RSV testing.  Fact Sheet for Patients: BloggerCourse.com  Fact Sheet for Healthcare Providers: SeriousBroker.it  This test is not yet approved or cleared by the [place] FDA and has been authorized for detection and/or diagnosis of SARS-CoV-2 by FDA under an Emergency Use Authorization (EUA). This EUA will remain in effect (meaning this test can be used) for the duration of the COVID-19 declaration under Section 564(b)(1) of the Act, 21 U.S.C. section 360bbb-3(b)(1), unless the authorization is terminated or revoked.     Resp Syncytial Virus by PCR NEGATIVE NEGATIVE Final    Comment: (NOTE) Fact Sheet for Patients: BloggerCourse.com  Fact Sheet for Healthcare Providers: SeriousBroker.it  This test is not yet approved or cleared by the [place] FDA and has been authorized for detection and/or diagnosis of SARS-CoV-2 by FDA under an Emergency Use Authorization (EUA). This EUA will remain in effect (meaning this test can be used) for the duration of the COVID-19 declaration under Section 564(b)(1) of the Act, 21 U.S.C. section 360bbb-3(b)(1), unless the authorization is terminated or revoked.  Performed at [facility], [address] [place], [place] [address]   Blood culture (routine x 2)     Status: Abnormal   Collection Time: 05/27/23  5:05 AM   Specimen: BLOOD RIGHT ARM  Result Value Ref Range Status   Specimen Description   Final    BLOOD  RIGHT ARM Performed at [facility], [address] [place], [place] [address]    Special Requests   Final    BOTTLES DRAWN AEROBIC AND ANAEROBIC Blood Culture results may not be optimal due to an inadequate volume of blood received in culture bottles Performed at [facility], [address] [place], [place] [address]    Culture  Setup Time   Final    IN BOTH AEROBIC AND ANAEROBIC BOTTLES GRAM POSITIVE RODS CONSISTENT WITH PREVIOUS RESULT CRITICAL RESULT CALLED TO, READ BACK BY AND VERIFIED WITH: MADISON HUNT @ 1850 05/27/23 LD Performed at [facility], [address] [place], [place] [address]    Culture (A)  Final    ACTINOMYCES SPECIES LACTOBACILLUS ACIDOPHILUS Standardized susceptibility testing for this organism  is not available. Performed at [facility], [address], [place], [place] [address]    Report Status 05/31/2023 FINAL  Final  Group A Strep by PCR ([facility] Only)     Status: None   Collection Time: 05/27/23  5:07 AM   Specimen: Throat; Sterile Swab  Result Value Ref Range Status   Group A Strep by PCR NOT DETECTED NOT DETECTED Final    Comment: Performed at [facility], [address] [place], [place] [address]  Blood culture (routine x 2)     Status: Abnormal   Collection Time: 05/27/23  5:07 AM   Specimen: BLOOD RIGHT ARM  Result Value Ref Range Status   Specimen Description   Final    BLOOD RIGHT ARM Performed at [facility], [address], [place], [place] [address]    Special Requests   Final    BOTTLES DRAWN AEROBIC AND ANAEROBIC Blood Culture results may not be optimal due to an inadequate volume of blood received in culture bottles Performed at [facility], [address] [place], [place] [address]    Culture  Setup Time   Final    IN BOTH AEROBIC AND ANAEROBIC BOTTLES GRAM POSITIVE RODS CRITICAL RESULT CALLED TO, READ BACK BY AND VERIFIED WITH: MADISON HUNT @ 1850 05/27/23  LFD Performed at [facility], [address] [place], [place] [address]    Culture (A)  Final    BACILLUS SPECIES ACTINOMYCES SPECIES Standardized susceptibility testing for this organism is not available. Performed at [facility], [address], [place], [place] [address]    Report Status 05/31/2023 FINAL  Final     Studies: DG CHEST PORT 1 VIEW Result Date: 05/31/2023 CLINICAL DATA:  Increasing shortness of breath EXAM: PORTABLE CHEST 1 VIEW COMPARISON:  05/27/2023 FINDINGS: Cardiomegaly with interim opacity at the left base. Globular cardiac configuration. Linear atelectasis or scarring in the left mid lung. IMPRESSION: Cardiomegaly with interim opacity at the left base which may be due to atelectasis or pneumonia. Electronically Signed   By: Jasmine Pang M.D.   On: 05/31/2023 23:12   CT Angio Chest PE W and/or Wo Contrast Result Date: 05/27/2023 CLINICAL DATA:  18 year old with history of sickle cell disease, currently in sickle cell crisis with generalized body pain. EXAM: CT ANGIOGRAPHY CHEST WITH CONTRAST TECHNIQUE: Multidetector CT imaging of the chest was performed using the standard protocol during bolus administration of intravenous contrast. Multiplanar CT image reconstructions and MIPs were obtained to evaluate the vascular anatomy. RADIATION DOSE REDUCTION: This exam was performed according to the departmental dose-optimization program which includes automated exposure control, adjustment of the mA and/or kV according to patient size and/or use of iterative reconstruction technique. CONTRAST:  75mL OMNIPAQUE IOHEXOL 350 MG/ML SOLN COMPARISON:  Portable chest from today, PA and lateral chest 05/16/2021. No prior cross-sectional imaging. FINDINGS: Cardiovascular: The aorta and great vessels are normal. The pulmonary trunk is prominent measuring 3.0 cm suggesting arterial hypertension, but no arterial embolism is seen. There is mild-to-moderate panchamber cardiomegaly  without findings of acute right heart strain. Small pericardial effusion. Pulmonary veins are normal caliber. Mediastinum/Nodes: There are prominent palatine tonsils abutting the uvula, partially visible on the first few slices. Correlate clinically for pharyngitis. Thyroid gland and axillary spaces are unremarkable. There is residual thymus in the anterior mediastinum. No intrathoracic adenopathy is seen. The thoracic trachea, thoracic esophagus and main bronchi are unremarkable. Lungs/Pleura: There are symmetric trace pleural effusions. There is a  low inspiration on exam. Interstitial and patchy opacities of the bilateral lower lobes could be due to pneumonia, aspiration, or low inspiration. In the upper lobes, other is mosaic attenuation which can be seen with small airways disease with air trapping, but no focal infiltrate. The right middle lobe is clear. Upper Abdomen: Status post cholecystectomy. No acute findings. There is a nonspecific 1 cm hypodense lesion noted in the superior spleen, another more inferiorly measuring 1.8 cm. Musculoskeletal: Mild thoracic kyphodextroscoliosis. There are central endplate defects in some of the thoracic vertebrae consistent with history of sickle cell disease. 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 No other significant osseous findings.  No chest wall mass is seen. Review of the MIP images confirms the above findings. IMPRESSION: 1. No evidence of arterial embolus. 2. Prominent pulmonary trunk suggesting arterial hypertension. 3. Cardiomegaly with small pericardial effusion. 4. Trace pleural effusions. 5. Interstitial and patchy opacities in the lower lobes which could be due to pneumonia, aspiration, or low inspiration. 6. Mosaic attenuation in the upper lobes which can be seen with small airways disease with air trapping. 7. Prominent palatine tonsils abutting the uvula. Correlate clinically for pharyngitis. 8. Nonspecific 1 cm and 1.8 cm hypodense  lesions in the spleen. Follow-up MRI with and without contrast recommended. 9. Avascular necrosis superior right humeral head. 10. Central endplate defects in some of the thoracic vertebrae consistent with history of sickle cell disease. Electronically Signed   By: Almira Bar M.D.   On: 05/27/2023 04:01   DG Chest Portable 1 View Result Date: 05/27/2023 CLINICAL DATA:  Sickle cell crisis with generalized body pain. EXAM: PORTABLE CHEST 1 VIEW COMPARISON:  May 16, 2021 FINDINGS: The heart size and mediastinal contours are within normal limits. Breast attenuation artifact is seen overlying the bilateral lung bases. Both lungs are otherwise clear. The visualized skeletal structures are unremarkable. IMPRESSION: No active disease. Electronically Signed   By: Aram Candela M.D.   On: 05/27/2023 01:18    Scheduled Meds:  enoxaparin (LOVENOX) injection  40 mg Subcutaneous Q24H   HYDROmorphone   Intravenous Q4H   ketorolac  15 mg Intravenous Q6H   mometasone-formoterol  2 puff Inhalation BID   oxyCODONE  10 mg Oral Q6H   pregabalin  75 mg Oral TID   senna-docusate  1 tablet Oral BID   Continuous Infusions:    Principal Problem:   Sickle cell anemia with crisis (HCC) Active Problems:   Chronic pain syndrome   Leucocytosis   Thrombocytosis   Obstructive sleep apnea syndrome   GAD (generalized anxiety disorder)   Mild intermittent asthma without complication

## 2023-06-02 DIAGNOSIS — D5700 Hb-SS disease with crisis, unspecified: Secondary | ICD-10-CM | POA: Diagnosis not present

## 2023-06-02 NOTE — Plan of Care (Signed)

## 2023-06-02 NOTE — Progress Notes (Signed)
SICKLE CELL SERVICE PROGRESS NOTE  Bailey Reese:[id number] DOB: [birth year] DOA: 05/28/2023 PCP: System, Provider Not In  Assessment/Plan: Principal Problem:   Sickle cell anemia with crisis (HCC) Active Problems:   Chronic pain syndrome   Leucocytosis   Thrombocytosis   Obstructive sleep apnea syndrome   GAD (generalized anxiety disorder)   Mild intermittent asthma without complication  Sickle cell pain crisis: Patient is still having 7 out of 10 pain.  Currently on Dilaudid PCA, Toradol and IV fluids.  Patient reported that she has had very slow recovery whenever she has attacks.  We will continue current treatment with reassessment of her pain status. I will restart oral home medications to fast track her recovery.  I am concerned that patient's pain is likely related to some psychosocial components.  She had a rapid response last night.  It is very unusual for her not to have any improvement.  She is refusing the Suboxone which was her long-acting prior to coming in.  I have scheduled her short acting medications here in the hospital.  She needs something for long-acting in order to get off PCA and for her chronic pain. Patient has felt better that we may likely discharge her tomorrow Anemia of chronic disease: Continue to monitor H&H Obstructive sleep apnea: Not on CPAP.  Counseling provided. Chronic pain syndrome: Patient appears to have significant chronic pain.  She was on Suboxone and oxycodone at home.  She is refusing the Suboxone here.  I have discussed with her that she will need to get something for long-acting.  She apparently was on Xtampza before but was discontinued by her physicians.  I am trying to get information from care everywhere to determine real reason why patient was taken off Xtampza.  Continue chronic pain management. Mild intermittent asthma: Continue empiric treatment with breathing treatment Leukocytosis: Secondary to vaso-occlusive crisis.  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 If 7PM-7AM, please contact night-coverage.  06/02/2023, 11:44 PM  LOS: 5 days   Brief narrative: Bailey Reese is a 18 y.o. female with medical history significant of sickle cell disease, PTSD, depression with anxiety who usually gets her care at [facility] but was seen at [facility] for the last 48 hours where she stayed in the ER with sickle cell pain crisis.  Patient was being managed in the ER for crisis.  She was waiting for a bed to go to [facility] where she normally gets her care however there was no bed available.  Patient was transferred to our care here for continued sickle cell crisis.  Patient reported to have pain similar to her typical sickle cell crisis.  Is mainly in her legs and back.  She has vitals that are stable.  Hemoglobin is around 8.4's.  She was getting IV Dilaudid pushes in the ER.  Patient denied any nausea vomiting or diarrhea.  Denied any viral illness.  She has had previous osteomyelitis and multiple episodes of acute chest syndromes.  Also hemorrhagic stroke in 2017.  Patient has chronic pain from her sickle cell disease.  She has been admitted for sickle cell pain management.   Consultants: None  Procedures: None  Antibiotics: None  HPI/Subjective: Patient's pain is still at 7 out of 10 mainly in her legs and back.  She denied any nausea vomiting or diarrhea.  She  is slowly responding to treatment.  She is aware that we will not keep her longer than necessary  Objective: Vitals:   06/02/23 1602 06/02/23 1759 06/02/23 2028 06/02/23 2220  BP:  116/75  118/63  Pulse:  98  91  Resp: 16 14 17 16   Temp:  98.2 F (36.8 C)  (!) 97.5 F (36.4 C)  TempSrc:  Oral  Oral  SpO2:  100% 100% 100%  Weight:      Height:       Weight change:  No intake or output  data in the 24 hours ending 06/02/23 2344   General: Alert, awake, oriented x3, in no acute distress.  HEENT: [place]/AT PEERL, EOMI Neck: Trachea midline,  no masses, no thyromegal,y no JVD, no carotid bruit OROPHARYNX:  Moist, No exudate/ erythema/lesions.  Heart: Regular rate and rhythm, without murmurs, rubs, gallops, PMI non-displaced, no heaves or thrills on palpation.  Lungs: Clear to auscultation, no wheezing or rhonchi noted. No increased vocal fremitus resonant to percussion  Abdomen: Soft, nontender, nondistended, positive bowel sounds, no masses no hepatosplenomegaly noted..  Neuro: No focal neurological deficits noted cranial nerves II through XII grossly intact. DTRs 2+ bilaterally upper and lower extremities. Strength 5 out of 5 in bilateral upper and lower extremities. Musculoskeletal: No warm swelling or erythema around joints, no spinal tenderness noted. Psychiatric: Patient alert and oriented x3, good insight and cognition, good recent to remote recall. Lymph node survey: No cervical axillary or inguinal lymphadenopathy noted.   Data Reviewed: Basic Metabolic Panel: Recent Labs  Lab 05/27/23 0022 05/28/23 1402 05/29/23 0543  NA 137  --  138  K 3.6  --  3.9  CL 107  --  108  CO2 22  --  23  GLUCOSE 95  --  86  BUN 6  --  8  CREATININE 0.51 0.34* 0.34*  CALCIUM 8.8*  --  9.0   Liver Function Tests: Recent Labs  Lab 05/27/23 0022 05/29/23 0543  AST 108* 41  ALT 289* 156*  ALKPHOS 195* 181*  BILITOT 3.3* 1.9*  PROT 7.9 7.4  ALBUMIN 4.0 3.6   No results for input(s): "LIPASE", "AMYLASE" in the last 168 hours. No results for input(s): "AMMONIA" in the last 168 hours. CBC: Recent Labs  Lab 05/27/23 0022 05/28/23 1402 05/29/23 0543 05/30/23 0549  WBC 14.3* 9.9 9.0 10.4  NEUTROABS 9.3*  --  5.2  --   HGB 8.4* 8.1* 8.3* 7.6*  HCT 24.0* 21.7* 23.5* 21.0*  MCV 109.1* 105.3* 106.8* 105.5*  PLT 411* 374 413* 392   Cardiac Enzymes: No results for input(s):  "CKTOTAL", "CKMB", "CKMBINDEX", "TROPONINI" in the last 168 hours. BNP (last 3 results) No results for input(s): "BNP" in the last 8760 hours.  ProBNP (last 3 results) No results for input(s): "PROBNP" in the last 8760 hours.  CBG: Recent Labs  Lab 05/31/23 2216  GLUCAP 108*    Recent Results (from the past 240 hours)  Resp panel by RT-PCR (RSV, Flu A&B, Covid) Anterior Nasal Swab     Status: None   Collection Time: 05/27/23 12:22 AM   Specimen: Anterior Nasal Swab  Result Value Ref Range Status   SARS Coronavirus 2 by RT PCR NEGATIVE NEGATIVE Final    Comment: (NOTE) SARS-CoV-2 target nucleic acids are NOT DETECTED.  The SARS-CoV-2 RNA is generally detectable in upper respiratory specimens during the acute phase of infection. The lowest concentration of SARS-CoV-2 viral copies this assay can detect is 138 copies/mL. A  negative result does not preclude SARS-Cov-2 infection and should not be used as the sole basis for treatment or other patient management decisions. A negative result may occur with  improper specimen collection/handling, submission of specimen other than nasopharyngeal swab, presence of viral mutation(s) within the areas targeted by this assay, and inadequate number of viral copies(<138 copies/mL). A negative result must be combined with clinical observations, patient history, and epidemiological information. The expected result is Negative.  Fact Sheet for Patients:  BloggerCourse.com  Fact Sheet for Healthcare Providers:  SeriousBroker.it  This test is no t yet approved or cleared by the [place] FDA and  has been authorized for detection and/or diagnosis of SARS-CoV-2 by FDA under an Emergency Use Authorization (EUA). This EUA will remain  in effect (meaning this test can be used) for the duration of the COVID-19 declaration under Section 564(b)(1) of the Act, 21 U.S.C.section 360bbb-3(b)(1),  unless the authorization is terminated  or revoked sooner.       Influenza A by PCR NEGATIVE NEGATIVE Final   Influenza B by PCR NEGATIVE NEGATIVE Final    Comment: (NOTE) The Xpert Xpress SARS-CoV-2/FLU/RSV plus assay is intended as an aid in the diagnosis of influenza from Nasopharyngeal swab specimens and should not be used as a sole basis for treatment. Nasal washings and aspirates are unacceptable for Xpert Xpress SARS-CoV-2/FLU/RSV testing.  Fact Sheet for Patients: BloggerCourse.com  Fact Sheet for Healthcare Providers: SeriousBroker.it  This test is not yet approved or cleared by the [place] FDA and has been authorized for detection and/or diagnosis of SARS-CoV-2 by FDA under an Emergency Use Authorization (EUA). This EUA will remain in effect (meaning this test can be used) for the duration of the COVID-19 declaration under Section 564(b)(1) of the Act, 21 U.S.C. section 360bbb-3(b)(1), unless the authorization is terminated or revoked.     Resp Syncytial Virus by PCR NEGATIVE NEGATIVE Final    Comment: (NOTE) Fact Sheet for Patients: BloggerCourse.com  Fact Sheet for Healthcare Providers: SeriousBroker.it  This test is not yet approved or cleared by the [place] FDA and has been authorized for detection and/or diagnosis of SARS-CoV-2 by FDA under an Emergency Use Authorization (EUA). This EUA will remain in effect (meaning this test can be used) for the duration of the COVID-19 declaration under Section 564(b)(1) of the Act, 21 U.S.C. section 360bbb-3(b)(1), unless the authorization is terminated or revoked.  Performed at [facility], [address] [place], [place] [address]   Blood culture (routine x 2)     Status: Abnormal   Collection Time: 05/27/23  5:05 AM   Specimen: BLOOD RIGHT ARM  Result Value Ref Range Status   Specimen  Description   Final    BLOOD RIGHT ARM Performed at [facility], [address], [place], [place] [address]    Special Requests   Final    BOTTLES DRAWN AEROBIC AND ANAEROBIC Blood Culture results may not be optimal due to an inadequate volume of blood received in culture bottles Performed at [facility], [address] [place], [place] [address]    Culture  Setup Time   Final    IN BOTH AEROBIC AND ANAEROBIC BOTTLES GRAM POSITIVE RODS CONSISTENT WITH PREVIOUS RESULT CRITICAL RESULT CALLED TO, READ BACK BY AND VERIFIED WITH: MADISON HUNT @ 1850 05/27/23 LD Performed at [facility], [address] [place], [place] [address]    Culture (A)  Final    ACTINOMYCES SPECIES LACTOBACILLUS ACIDOPHILUS Standardized susceptibility  testing for this organism is not available. Performed at [facility], [address] [place], [place] [address]    Report Status 05/31/2023 FINAL  Final  Group A Strep by PCR ([facility] Only)     Status: None   Collection Time: 05/27/23  5:07 AM   Specimen: Throat; Sterile Swab  Result Value Ref Range Status   Group A Strep by PCR NOT DETECTED NOT DETECTED Final    Comment: Performed at [facility], [address] [place], [place] [address]  Blood culture (routine x 2)     Status: Abnormal   Collection Time: 05/27/23  5:07 AM   Specimen: BLOOD RIGHT ARM  Result Value Ref Range Status   Specimen Description   Final    BLOOD RIGHT ARM Performed at [facility], [address] [place], [place] [address]    Special Requests   Final    BOTTLES DRAWN AEROBIC AND ANAEROBIC Blood Culture results may not be optimal due to an inadequate volume of blood received in culture bottles Performed at [facility], [address] [place], [place] [address]    Culture  Setup Time   Final    IN BOTH AEROBIC AND ANAEROBIC BOTTLES GRAM POSITIVE RODS CRITICAL RESULT CALLED TO, READ BACK BY AND VERIFIED  WITH: MADISON HUNT @ 1850 05/27/23 LFD Performed at [facility], [address] [place], [place] [address]    Culture (A)  Final    BACILLUS SPECIES ACTINOMYCES SPECIES Standardized susceptibility testing for this organism is not available. Performed at [facility], [address] [place], [place] [address]    Report Status 05/31/2023 FINAL  Final     Studies: DG CHEST PORT 1 VIEW Result Date: 05/31/2023 CLINICAL DATA:  Increasing shortness of breath EXAM: PORTABLE CHEST 1 VIEW COMPARISON:  05/27/2023 FINDINGS: Cardiomegaly with interim opacity at the left base. Globular cardiac configuration. Linear atelectasis or scarring in the left mid lung. IMPRESSION: Cardiomegaly with interim opacity at the left base which may be due to atelectasis or pneumonia. Electronically Signed   By: Jasmine Pang M.D.   On: 05/31/2023 23:12   CT Angio Chest PE W and/or Wo Contrast Result Date: 05/27/2023 CLINICAL DATA:  18 year old with history of sickle cell disease, currently in sickle cell crisis with generalized body pain. EXAM: CT ANGIOGRAPHY CHEST WITH CONTRAST TECHNIQUE: Multidetector CT imaging of the chest was performed using the standard protocol during bolus administration of intravenous contrast. Multiplanar CT image reconstructions and MIPs were obtained to evaluate the vascular anatomy. RADIATION DOSE REDUCTION: This exam was performed according to the departmental dose-optimization program which includes automated exposure control, adjustment of the mA and/or kV according to patient size and/or use of iterative reconstruction technique. CONTRAST:  75mL OMNIPAQUE IOHEXOL 350 MG/ML SOLN COMPARISON:  Portable chest from today, PA and lateral chest 05/16/2021. No prior cross-sectional imaging. FINDINGS: Cardiovascular: The aorta and great vessels are normal. The pulmonary trunk is prominent measuring 3.0 cm suggesting arterial hypertension, but no arterial embolism is seen. There is  mild-to-moderate panchamber cardiomegaly without findings of acute right heart strain. Small pericardial effusion. Pulmonary veins are normal caliber. Mediastinum/Nodes: There are prominent palatine tonsils abutting the uvula, partially visible on the first few slices. Correlate clinically for pharyngitis. Thyroid gland and axillary spaces are unremarkable. There is residual thymus in the anterior mediastinum. No intrathoracic adenopathy is seen. The thoracic trachea, thoracic esophagus and main bronchi are unremarkable. Lungs/Pleura: There are symmetric trace pleural  effusions. There is a low inspiration on exam. Interstitial and patchy opacities of the bilateral lower lobes could be due to pneumonia, aspiration, or low inspiration. In the upper lobes, other is mosaic attenuation which can be seen with small airways disease with air trapping, but no focal infiltrate. The right middle lobe is clear. Upper Abdomen: Status post cholecystectomy. No acute findings. There is a nonspecific 1 cm hypodense lesion noted in the superior spleen, another more inferiorly measuring 1.8 cm. Musculoskeletal: Mild thoracic kyphodextroscoliosis. There are central endplate defects in some of the thoracic vertebrae consistent with history of sickle cell disease. 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 No other significant osseous findings.  No chest wall mass is seen. Review of the MIP images confirms the above findings. IMPRESSION: 1. No evidence of arterial embolus. 2. Prominent pulmonary trunk suggesting arterial hypertension. 3. Cardiomegaly with small pericardial effusion. 4. Trace pleural effusions. 5. Interstitial and patchy opacities in the lower lobes which could be due to pneumonia, aspiration, or low inspiration. 6. Mosaic attenuation in the upper lobes which can be seen with small airways disease with air trapping. 7. Prominent palatine tonsils abutting the uvula. Correlate clinically for pharyngitis. 8.  Nonspecific 1 cm and 1.8 cm hypodense lesions in the spleen. Follow-up MRI with and without contrast recommended. 9. Avascular necrosis superior right humeral head. 10. Central endplate defects in some of the thoracic vertebrae consistent with history of sickle cell disease. Electronically Signed   By: Almira Bar M.D.   On: 05/27/2023 04:01   DG Chest Portable 1 View Result Date: 05/27/2023 CLINICAL DATA:  Sickle cell crisis with generalized body pain. EXAM: PORTABLE CHEST 1 VIEW COMPARISON:  May 16, 2021 FINDINGS: The heart size and mediastinal contours are within normal limits. Breast attenuation artifact is seen overlying the bilateral lung bases. Both lungs are otherwise clear. The visualized skeletal structures are unremarkable. IMPRESSION: No active disease. Electronically Signed   By: Aram Candela M.D.   On: 05/27/2023 01:18    Scheduled Meds:  enoxaparin (LOVENOX) injection  40 mg Subcutaneous Q24H   HYDROmorphone   Intravenous Q4H   mometasone-formoterol  2 puff Inhalation BID   oxyCODONE  10 mg Oral Q6H   pregabalin  75 mg Oral TID   senna-docusate  1 tablet Oral BID   Continuous Infusions:    Principal Problem:   Sickle cell anemia with crisis (HCC) Active Problems:   Chronic pain syndrome   Leucocytosis   Thrombocytosis   Obstructive sleep apnea syndrome   GAD (generalized anxiety disorder)   Mild intermittent asthma without complication

## 2023-06-03 DIAGNOSIS — D5700 Hb-SS disease with crisis, unspecified: Secondary | ICD-10-CM | POA: Diagnosis not present

## 2023-06-03 LAB — CBC WITH DIFFERENTIAL/PLATELET
Abs Immature Granulocytes: 0.04 10*3/uL (ref 0.00–0.07)
Basophils Absolute: 0.1 10*3/uL (ref 0.0–0.1)
Basophils Relative: 1 %
Eosinophils Absolute: 0.3 10*3/uL (ref 0.0–0.5)
Eosinophils Relative: 3 %
HCT: 22.2 % — ABNORMAL LOW (ref 36.0–46.0)
Hemoglobin: 7.7 g/dL — ABNORMAL LOW (ref 12.0–15.0)
Immature Granulocytes: 0 %
Lymphocytes Relative: 26 %
Lymphs Abs: 3.2 10*3/uL (ref 0.7–4.0)
MCH: 35.2 pg — ABNORMAL HIGH (ref 26.0–34.0)
MCHC: 34.7 g/dL (ref 30.0–36.0)
MCV: 101.4 fL — ABNORMAL HIGH (ref 80.0–100.0)
Monocytes Absolute: 2.3 10*3/uL — ABNORMAL HIGH (ref 0.1–1.0)
Monocytes Relative: 18 %
Neutro Abs: 6.6 10*3/uL (ref 1.7–7.7)
Neutrophils Relative %: 52 %
Platelets: 524 10*3/uL — ABNORMAL HIGH (ref 150–400)
RBC: 2.19 MIL/uL — ABNORMAL LOW (ref 3.87–5.11)
RDW: 19.6 % — ABNORMAL HIGH (ref 11.5–15.5)
WBC: 12.6 10*3/uL — ABNORMAL HIGH (ref 4.0–10.5)
nRBC: 0 % (ref 0.0–0.2)

## 2023-06-03 LAB — COMPREHENSIVE METABOLIC PANEL
ALT: 74 U/L — ABNORMAL HIGH (ref 0–44)
AST: 31 U/L (ref 15–41)
Albumin: 4 g/dL (ref 3.5–5.0)
Alkaline Phosphatase: 216 U/L — ABNORMAL HIGH (ref 38–126)
Anion gap: 11 (ref 5–15)
BUN: 9 mg/dL (ref 6–20)
CO2: 23 mmol/L (ref 22–32)
Calcium: 9.5 mg/dL (ref 8.9–10.3)
Chloride: 102 mmol/L (ref 98–111)
Creatinine, Ser: 0.38 mg/dL — ABNORMAL LOW (ref 0.44–1.00)
GFR, Estimated: >60 mL/min (ref >60)
Glucose, Bld: 105 mg/dL — ABNORMAL HIGH (ref 70–99)
Potassium: 3.8 mmol/L (ref 3.5–5.1)
Sodium: 136 mmol/L (ref 135–145)
Total Bilirubin: 2.2 mg/dL — ABNORMAL HIGH (ref 0.0–1.2)
Total Protein: 8.2 g/dL — ABNORMAL HIGH (ref 6.5–8.1)

## 2023-06-03 MED ORDER — OXYCODONE HCL 5 MG PO TABS
10.0000 mg | ORAL_TABLET | ORAL | Status: DC | PRN
  Administered 2023-06-03 – 2023-06-08 (×17): 10 mg via ORAL
  Filled 2023-06-03 (×17): qty 2

## 2023-06-03 NOTE — Plan of Care (Signed)

## 2023-06-03 NOTE — Progress Notes (Signed)
Subjective: Bailey Reese is a 18 year old female with a medical history significant for sickle cell disease, PTSD, history of depression with anxiety, and anemia of chronic disease that was admitted for sickle cell pain crisis. Patient states that pain intensity has not improved overnight.  She rates her pain as 9/10. Patient denies headache, shortness of breath, urinary symptoms, nausea, vomiting, or diarrhea.  Objective:  Vital signs in last 24 hours:  Vitals:   06/03/23 1003 06/03/23 1140 06/03/23 1253 06/03/23 1408  BP: 111/68   124/70  Pulse: 97   92  Resp: 16 18 12 14   Temp: 98.7 F (37.1 C)   98.6 F (37 C)  TempSrc: Oral   Oral  SpO2: 100% 100% 100% 100%  Weight:      Height:        Intake/Output from previous day:  No intake or output data in the 24 hours ending 06/03/23 1416  Physical Exam: General: Alert, awake, oriented x3, in no acute distress.  HEENT: [place]/AT PEERL, EOMI Neck: Trachea midline,  no masses, no thyromegal,y no JVD, no carotid bruit OROPHARYNX:  Moist, No exudate/ erythema/lesions.  Heart: Regular rate and rhythm, without murmurs, rubs, gallops, PMI non-displaced, no heaves or thrills on palpation.  Lungs: Clear to auscultation, no wheezing or rhonchi noted. No increased vocal fremitus resonant to percussion  Abdomen: Soft, nontender, nondistended, positive bowel sounds, no masses no hepatosplenomegaly noted..  Neuro: No focal neurological deficits noted cranial nerves II through XII grossly intact. DTRs 2+ bilaterally upper and lower extremities. Strength 5 out of 5 in bilateral upper and lower extremities. Musculoskeletal: No warm swelling or erythema around joints, no spinal tenderness noted. Psychiatric: Patient alert and oriented x3, good insight and cognition, good recent to remote recall. Lymph node survey: No cervical axillary or inguinal lymphadenopathy noted.  Lab Results:  Basic Metabolic Panel:    Component Value Date/Time   NA 136  06/03/2023 1053   NA 135 05/01/2013 1440   K 3.8 06/03/2023 1053   K 4.3 05/01/2013 1440   CL 102 06/03/2023 1053   CL 102 05/01/2013 1440   CO2 23 06/03/2023 1053   CO2 27 (H) 05/01/2013 1440   BUN 9 06/03/2023 1053   BUN 13 05/01/2013 1440   CREATININE 0.38 (L) 06/03/2023 1053   CREATININE 0.40 (L) 05/01/2013 1440   GLUCOSE 105 (H) 06/03/2023 1053   GLUCOSE 108 (H) 05/01/2013 1440   CALCIUM 9.5 06/03/2023 1053   CALCIUM 9.8 05/01/2013 1440   CBC:    Component Value Date/Time   WBC 12.6 (H) 06/03/2023 1053   HGB 7.7 (L) 06/03/2023 1053   HGB 6.6 (L) 05/01/2013 1440   HCT 22.2 (L) 06/03/2023 1053   HCT 20.1 (L) 05/01/2013 1440   PLT 524 (H) 06/03/2023 1053   PLT 468 (H) 05/01/2013 1440   MCV 101.4 (H) 06/03/2023 1053   MCV 89 05/01/2013 1440   NEUTROABS 6.6 06/03/2023 1053   NEUTROABS 7.9 05/30/2012 1958   LYMPHSABS 3.2 06/03/2023 1053   LYMPHSABS 5.0 05/30/2012 1958   MONOABS 2.3 (H) 06/03/2023 1053   MONOABS 1.6 (H) 05/30/2012 1958   EOSABS 0.3 06/03/2023 1053   EOSABS 0.6 05/30/2012 1958   BASOSABS 0.1 06/03/2023 1053   BASOSABS 3 04/28/2013 1736   BASOSABS 0.2 (H) 05/30/2012 1958    Recent Results (from the past 240 hours)  Resp panel by RT-PCR (RSV, Flu A&B, Covid) Anterior Nasal Swab     Status: None   Collection Time:  05/27/23 12:22 AM   Specimen: Anterior Nasal Swab  Result Value Ref Range Status   SARS Coronavirus 2 by RT PCR NEGATIVE NEGATIVE Final    Comment: (NOTE) SARS-CoV-2 target nucleic acids are NOT DETECTED.  The SARS-CoV-2 RNA is generally detectable in upper respiratory specimens during the acute phase of infection. The lowest concentration of SARS-CoV-2 viral copies this assay can detect is 138 copies/mL. A negative result does not preclude SARS-Cov-2 infection and should not be used as the sole basis for treatment or other patient management decisions. A negative result may occur with  improper specimen collection/handling, submission of  specimen other than nasopharyngeal swab, presence of viral mutation(s) within the areas targeted by this assay, and inadequate number of viral copies(<138 copies/mL). A negative result must be combined with clinical observations, patient history, and epidemiological information. The expected result is Negative.  Fact Sheet for Patients:  BloggerCourse.com  Fact Sheet for Healthcare Providers:  SeriousBroker.it  This test is no t yet approved or cleared by the [place] FDA and  has been authorized for detection and/or diagnosis of SARS-CoV-2 by FDA under an Emergency Use Authorization (EUA). This EUA will remain  in effect (meaning this test can be used) for the duration of the COVID-19 declaration under Section 564(b)(1) of the Act, 21 U.S.C.section 360bbb-3(b)(1), unless the authorization is terminated  or revoked sooner.       Influenza A by PCR NEGATIVE NEGATIVE Final   Influenza B by PCR NEGATIVE NEGATIVE Final    Comment: (NOTE) The Xpert Xpress SARS-CoV-2/FLU/RSV plus assay is intended as an aid in the diagnosis of influenza from Nasopharyngeal swab specimens and should not be used as a sole basis for treatment. Nasal washings and aspirates are unacceptable for Xpert Xpress SARS-CoV-2/FLU/RSV testing.  Fact Sheet for Patients: BloggerCourse.com  Fact Sheet for Healthcare Providers: SeriousBroker.it  This test is not yet approved or cleared by the [place] FDA and has been authorized for detection and/or diagnosis of SARS-CoV-2 by FDA under an Emergency Use Authorization (EUA). This EUA will remain in effect (meaning this test can be used) for the duration of the COVID-19 declaration under Section 564(b)(1) of the Act, 21 U.S.C. section 360bbb-3(b)(1), unless the authorization is terminated or revoked.     Resp Syncytial Virus by PCR NEGATIVE NEGATIVE Final     Comment: (NOTE) Fact Sheet for Patients: BloggerCourse.com  Fact Sheet for Healthcare Providers: SeriousBroker.it  This test is not yet approved or cleared by the [place] FDA and has been authorized for detection and/or diagnosis of SARS-CoV-2 by FDA under an Emergency Use Authorization (EUA). This EUA will remain in effect (meaning this test can be used) for the duration of the COVID-19 declaration under Section 564(b)(1) of the Act, 21 U.S.C. section 360bbb-3(b)(1), unless the authorization is terminated or revoked.  Performed at [facility], [address] [place], [place] [address]   Blood culture (routine x 2)     Status: Abnormal   Collection Time: 05/27/23  5:05 AM   Specimen: BLOOD RIGHT ARM  Result Value Ref Range Status   Specimen Description   Final    BLOOD RIGHT ARM Performed at [facility], [address], [place], [place] [address]    Special Requests   Final    BOTTLES DRAWN AEROBIC AND ANAEROBIC Blood Culture results may not be optimal due to an inadequate volume of blood received in culture bottles Performed at [facility], [address] [place], [place] [address]  Culture  Setup Time   Final    IN BOTH AEROBIC AND ANAEROBIC BOTTLES GRAM POSITIVE RODS CONSISTENT WITH PREVIOUS RESULT CRITICAL RESULT CALLED TO, READ BACK BY AND VERIFIED WITH: MADISON HUNT @ 1850 05/27/23 LD Performed at [facility], [address] [place], [place] [address]    Culture (A)  Final    ACTINOMYCES SPECIES LACTOBACILLUS ACIDOPHILUS Standardized susceptibility testing for this organism is not available. Performed at [facility], [address], [place], [place] [address]    Report Status 05/31/2023 FINAL  Final  Group A Strep by PCR ([facility] Only)     Status: None   Collection Time: 05/27/23  5:07 AM   Specimen: Throat; Sterile Swab  Result Value Ref Range Status    Group A Strep by PCR NOT DETECTED NOT DETECTED Final    Comment: Performed at [facility], [address] [place], [place] [address]  Blood culture (routine x 2)     Status: Abnormal   Collection Time: 05/27/23  5:07 AM   Specimen: BLOOD RIGHT ARM  Result Value Ref Range Status   Specimen Description   Final    BLOOD RIGHT ARM Performed at [facility], [address] [place], [place] [address]    Special Requests   Final    BOTTLES DRAWN AEROBIC AND ANAEROBIC Blood Culture results may not be optimal due to an inadequate volume of blood received in culture bottles Performed at [facility], [address] [place], [place] [address]    Culture  Setup Time   Final    IN BOTH AEROBIC AND ANAEROBIC BOTTLES GRAM POSITIVE RODS CRITICAL RESULT CALLED TO, READ BACK BY AND VERIFIED WITH: MADISON HUNT @ 1850 05/27/23 LFD Performed at [facility], [address] [place], [place] [address]    Culture (A)  Final    BACILLUS SPECIES ACTINOMYCES SPECIES Standardized susceptibility testing for this organism is not available. Performed at [facility], [address] [place], [place] [address]    Report Status 05/31/2023 FINAL  Final    Studies/Results: No results found.  Medications: Scheduled Meds:  enoxaparin (LOVENOX) injection  40 mg Subcutaneous Q24H   HYDROmorphone   Intravenous Q4H   mometasone-formoterol  2 puff Inhalation BID   pregabalin  75 mg Oral TID   senna-docusate  1 tablet Oral BID   Continuous Infusions: PRN Meds:.albuterol, diphenhydrAMINE, fluticasone, HYDROmorphone, LORazepam, naloxone **AND** sodium chloride flush, ondansetron (ZOFRAN) IV, ondansetron, oxyCODONE, polyethylene glycol  Consultants: none  Procedures: none  Antibiotics: none  Assessment/Plan: Principal Problem:   Sickle cell anemia with crisis (HCC) Active Problems:   Chronic pain syndrome   Leucocytosis   Thrombocytosis   Obstructive  sleep apnea syndrome   GAD (generalized anxiety disorder)   Mild intermittent asthma without complication  Sickle cell disease with pain crisis: Continue IV Dilaudid PCA without changes Increased frequency of oxycodone 10 mg every 4 hours as needed for severe breakthrough pain Patient has completed Toradol Monitor vital signs very closely, reevaluate pain scale regularly, and supplemental oxygen as needed.  Chronic pain syndrome: Restart home medications as pain intensity improves. Obstructive sleep apnea: Stable.  Not on CPAP.  Mild intermittent asthma: Continue empiric treatment as needed. Leukocytosis: Secondary to sickle cell crisis.  Continue to monitor closely.  Labs in AM.  Thrombocytosis: Stable.  Secondary to sickle cell disease.  Monitor closely.  Anemia of chronic disease: Patient's hemoglobin is stable and consistent with her baseline.  There is no clinical indication for blood transfusion at this time.  Code Status: Full Code Family Communication: N/A Disposition Plan: Not yet ready for discharge  Nolon Nations  DNP, APRN, FNP-C [facility] [address] [place], [place] [address] [phone]  If 7PM-7AM, please contact night-coverage.  06/03/2023, 2:16 PM  LOS: 6 days

## 2023-06-04 DIAGNOSIS — D5700 Hb-SS disease with crisis, unspecified: Secondary | ICD-10-CM | POA: Diagnosis not present

## 2023-06-04 LAB — CREATININE, SERUM
Creatinine, Ser: 0.41 mg/dL — ABNORMAL LOW (ref 0.44–1.00)
GFR, Estimated: >60 mL/min (ref >60)

## 2023-06-04 MED ORDER — ORAL CARE MOUTH RINSE: 15.0000 mL | OROMUCOSAL | Status: DC | PRN

## 2023-06-04 NOTE — Plan of Care (Signed)
  Problem: Education: Goal: Knowledge of General Education information will improve Description: Including pain rating scale, medication(s)/side effects and non-pharmacologic comfort measures Outcome: Progressing   Problem: Clinical Measurements: Goal: Will remain free from infection Outcome: Progressing Goal: Diagnostic test results will improve Outcome: Progressing Goal: Respiratory complications will improve Outcome: Progressing Goal: Cardiovascular complication will be avoided Outcome: Progressing   Problem: Nutrition: Goal: Adequate nutrition will be maintained Outcome: Progressing   Problem: Coping: Goal: Level of anxiety will decrease Outcome: Progressing   Problem: Elimination: Goal: Will not experience complications related to bowel motility Outcome: Progressing Goal: Will not experience complications related to urinary retention Outcome: Progressing

## 2023-06-04 NOTE — Progress Notes (Signed)
Subjective: Bailey Reese is a 18 year old female with a medical history significant for sickle cell disease, PTSD, history of depression with anxiety, and anemia of chronic disease that was admitted for sickle cell pain crisis. Patient states that pain intensity has not improved overnight.  She rates her pain as 9/10. Patient denies headache, shortness of breath, urinary symptoms, nausea, vomiting, or diarrhea.  Objective:  Vital signs in last 24 hours:  Vitals:   06/04/23 0800 06/04/23 0911 06/04/23 0952 06/04/23 0953  BP:   111/72   Pulse:   (!) 102   Resp: 15  14 14   Temp:   98.6 F (37 C)   TempSrc:   Oral   SpO2: 100% 100% 100%   Weight:      Height:        Intake/Output from previous day:  No intake or output data in the 24 hours ending 06/04/23 1122  Physical Exam: General: Alert, awake, oriented x3, in no acute distress.  HEENT: /AT PEERL, EOMI Neck: Trachea midline,  no masses, no thyromegal,y no JVD, no carotid bruit OROPHARYNX:  Moist, No exudate/ erythema/lesions.  Heart: Regular rate and rhythm, without murmurs, rubs, gallops, PMI non-displaced, no heaves or thrills on palpation.  Lungs: Clear to auscultation, no wheezing or rhonchi noted. No increased vocal fremitus resonant to percussion  Abdomen: Soft, nontender, nondistended, positive bowel sounds, no masses no hepatosplenomegaly noted..  Neuro: No focal neurological deficits noted cranial nerves II through XII grossly intact. DTRs 2+ bilaterally upper and lower extremities. Strength 5 out of 5 in bilateral upper and lower extremities. Musculoskeletal: No warm swelling or erythema around joints, no spinal tenderness noted. Psychiatric: Patient alert and oriented x3, good insight and cognition, good recent to remote recall. Lymph node survey: No cervical axillary or inguinal lymphadenopathy noted.  Lab Results:  Basic Metabolic Panel:    Component Value Date/Time   NA 136 06/03/2023 1053   NA 135  05/01/2013 1440   K 3.8 06/03/2023 1053   K 4.3 05/01/2013 1440   CL 102 06/03/2023 1053   CL 102 05/01/2013 1440   CO2 23 06/03/2023 1053   CO2 27 (H) 05/01/2013 1440   BUN 9 06/03/2023 1053   BUN 13 05/01/2013 1440   CREATININE 0.41 (L) 06/04/2023 0651   CREATININE 0.40 (L) 05/01/2013 1440   GLUCOSE 105 (H) 06/03/2023 1053   GLUCOSE 108 (H) 05/01/2013 1440   CALCIUM 9.5 06/03/2023 1053   CALCIUM 9.8 05/01/2013 1440   CBC:    Component Value Date/Time   WBC 12.6 (H) 06/03/2023 1053   HGB 7.7 (L) 06/03/2023 1053   HGB 6.6 (L) 05/01/2013 1440   HCT 22.2 (L) 06/03/2023 1053   HCT 20.1 (L) 05/01/2013 1440   PLT 524 (H) 06/03/2023 1053   PLT 468 (H) 05/01/2013 1440   MCV 101.4 (H) 06/03/2023 1053   MCV 89 05/01/2013 1440   NEUTROABS 6.6 06/03/2023 1053   NEUTROABS 7.9 05/30/2012 1958   LYMPHSABS 3.2 06/03/2023 1053   LYMPHSABS 5.0 05/30/2012 1958   MONOABS 2.3 (H) 06/03/2023 1053   MONOABS 1.6 (H) 05/30/2012 1958   EOSABS 0.3 06/03/2023 1053   EOSABS 0.6 05/30/2012 1958   BASOSABS 0.1 06/03/2023 1053   BASOSABS 3 04/28/2013 1736   BASOSABS 0.2 (H) 05/30/2012 1958    Recent Results (from the past 240 hours)  Resp panel by RT-PCR (RSV, Flu A&B, Covid) Anterior Nasal Swab     Status: None   Collection Time: 05/27/23 12:22  AM   Specimen: Anterior Nasal Swab  Result Value Ref Range Status   SARS Coronavirus 2 by RT PCR NEGATIVE NEGATIVE Final    Comment: (NOTE) SARS-CoV-2 target nucleic acids are NOT DETECTED.  The SARS-CoV-2 RNA is generally detectable in upper respiratory specimens during the acute phase of infection. The lowest concentration of SARS-CoV-2 viral copies this assay can detect is 138 copies/mL. A negative result does not preclude SARS-Cov-2 infection and should not be used as the sole basis for treatment or other patient management decisions. A negative result may occur with  improper specimen collection/handling, submission of specimen other than  nasopharyngeal swab, presence of viral mutation(s) within the areas targeted by this assay, and inadequate number of viral copies(<138 copies/mL). A negative result must be combined with clinical observations, patient history, and epidemiological information. The expected result is Negative.  Fact Sheet for Patients:  BloggerCourse.com  Fact Sheet for Healthcare Providers:  SeriousBroker.it  This test is no t yet approved or cleared by the [place] FDA and  has been authorized for detection and/or diagnosis of SARS-CoV-2 by FDA under an Emergency Use Authorization (EUA). This EUA will remain  in effect (meaning this test can be used) for the duration of the COVID-19 declaration under Section 564(b)(1) of the Act, 21 U.S.C.section 360bbb-3(b)(1), unless the authorization is terminated  or revoked sooner.       Influenza A by PCR NEGATIVE NEGATIVE Final   Influenza B by PCR NEGATIVE NEGATIVE Final    Comment: (NOTE) The Xpert Xpress SARS-CoV-2/FLU/RSV plus assay is intended as an aid in the diagnosis of influenza from Nasopharyngeal swab specimens and should not be used as a sole basis for treatment. Nasal washings and aspirates are unacceptable for Xpert Xpress SARS-CoV-2/FLU/RSV testing.  Fact Sheet for Patients: BloggerCourse.com  Fact Sheet for Healthcare Providers: SeriousBroker.it  This test is not yet approved or cleared by the [place] FDA and has been authorized for detection and/or diagnosis of SARS-CoV-2 by FDA under an Emergency Use Authorization (EUA). This EUA will remain in effect (meaning this test can be used) for the duration of the COVID-19 declaration under Section 564(b)(1) of the Act, 21 U.S.C. section 360bbb-3(b)(1), unless the authorization is terminated or revoked.     Resp Syncytial Virus by PCR NEGATIVE NEGATIVE Final    Comment:  (NOTE) Fact Sheet for Patients: BloggerCourse.com  Fact Sheet for Healthcare Providers: SeriousBroker.it  This test is not yet approved or cleared by the [place] FDA and has been authorized for detection and/or diagnosis of SARS-CoV-2 by FDA under an Emergency Use Authorization (EUA). This EUA will remain in effect (meaning this test can be used) for the duration of the COVID-19 declaration under Section 564(b)(1) of the Act, 21 U.S.C. section 360bbb-3(b)(1), unless the authorization is terminated or revoked.  Performed at [facility], [address] [place], [place] [address]   Blood culture (routine x 2)     Status: Abnormal   Collection Time: 05/27/23  5:05 AM   Specimen: BLOOD RIGHT ARM  Result Value Ref Range Status   Specimen Description   Final    BLOOD RIGHT ARM Performed at [facility], [address], [place], [place] [address]    Special Requests   Final    BOTTLES DRAWN AEROBIC AND ANAEROBIC Blood Culture results may not be optimal due to an inadequate volume of blood received in culture bottles Performed at [facility], [address] [place], [place] [address]  Culture  Setup Time   Final    IN BOTH AEROBIC AND ANAEROBIC BOTTLES GRAM POSITIVE RODS CONSISTENT WITH PREVIOUS RESULT CRITICAL RESULT CALLED TO, READ BACK BY AND VERIFIED WITH: MADISON HUNT @ 1850 05/27/23 LD Performed at [facility], [address] [place], [place] [address]    Culture (A)  Final    ACTINOMYCES SPECIES LACTOBACILLUS ACIDOPHILUS Standardized susceptibility testing for this organism is not available. Performed at [facility], [address] [place], [place] [address]    Report Status 05/31/2023 FINAL  Final  Group A Strep by PCR ([facility] Only)     Status: None   Collection Time: 05/27/23  5:07 AM   Specimen: Throat; Sterile Swab  Result Value Ref Range Status   Group A  Strep by PCR NOT DETECTED NOT DETECTED Final    Comment: Performed at [facility], [address] [place], [place] [address]  Blood culture (routine x 2)     Status: Abnormal   Collection Time: 05/27/23  5:07 AM   Specimen: BLOOD RIGHT ARM  Result Value Ref Range Status   Specimen Description   Final    BLOOD RIGHT ARM Performed at [facility], [address], [place], [place] [address]    Special Requests   Final    BOTTLES DRAWN AEROBIC AND ANAEROBIC Blood Culture results may not be optimal due to an inadequate volume of blood received in culture bottles Performed at [facility], [address] [place], [place] [address]    Culture  Setup Time   Final    IN BOTH AEROBIC AND ANAEROBIC BOTTLES GRAM POSITIVE RODS CRITICAL RESULT CALLED TO, READ BACK BY AND VERIFIED WITH: MADISON HUNT @ 1850 05/27/23 LFD Performed at [facility], [address] [place], [place] [address]    Culture (A)  Final    BACILLUS SPECIES ACTINOMYCES SPECIES Standardized susceptibility testing for this organism is not available. Performed at [facility], [address], [place], [place] [address]    Report Status 05/31/2023 FINAL  Final    Studies/Results: No results found.  Medications: Scheduled Meds:  enoxaparin (LOVENOX) injection  40 mg Subcutaneous Q24H   HYDROmorphone   Intravenous Q4H   mometasone-formoterol  2 puff Inhalation BID   pregabalin  75 mg Oral TID   senna-docusate  1 tablet Oral BID   Continuous Infusions: PRN Meds:.albuterol, diphenhydrAMINE, fluticasone, LORazepam, naloxone **AND** sodium chloride flush, ondansetron (ZOFRAN) IV, ondansetron, mouth rinse, oxyCODONE, polyethylene glycol  Consultants: none  Procedures: none  Antibiotics: none  Assessment/Plan: Principal Problem:   Sickle cell anemia with crisis (HCC) Active Problems:   Chronic pain syndrome   Leucocytosis   Thrombocytosis   Obstructive sleep apnea  syndrome   GAD (generalized anxiety disorder)   Mild intermittent asthma without complication  Sickle cell disease with pain crisis: Continue IV Dilaudid PCA without changes Increased frequency of oxycodone 10 mg every 4 hours as needed for severe breakthrough pain Patient has completed Toradol Monitor vital signs very closely, reevaluate pain scale regularly, and supplemental oxygen as needed.  Chronic pain syndrome: Restart home medications as pain intensity improves. Obstructive sleep apnea: Stable.  Not on CPAP.  Mild intermittent asthma: Continue empiric treatment as needed. Leukocytosis: Secondary to sickle cell crisis.  Continue to monitor closely.  Labs in AM.  Thrombocytosis: Stable.  Secondary to sickle cell disease.  Monitor closely.  Anemia of chronic disease: Patient's hemoglobin is stable and consistent with her baseline.  There is no clinical indication for blood transfusion at this time.  Code Status: Full Code Family Communication: N/A Disposition Plan: Not yet ready for discharge  Nolon Nations  DNP, APRN, FNP-C [facility] [address] [place], [place] [address] [phone]  If 7PM-7AM, please contact night-coverage.  06/04/2023, 11:22 AM  LOS: 7 days

## 2023-06-04 NOTE — Progress Notes (Signed)
Encouraged patient to ambulate in hall and get cleaned up at the beginning of shift. Patient stated that she was not ready at that time. Will continue to encourage patient in these matters.

## 2023-06-05 DIAGNOSIS — D5700 Hb-SS disease with crisis, unspecified: Secondary | ICD-10-CM | POA: Diagnosis not present

## 2023-06-05 NOTE — Plan of Care (Signed)
  Problem: Education: Goal: Knowledge of General Education information will improve Description: Including pain rating scale, medication(s)/side effects and non-pharmacologic comfort measures Outcome: Progressing   Problem: Clinical Measurements: Goal: Will remain free from infection Outcome: Progressing Goal: Respiratory complications will improve Outcome: Progressing Goal: Cardiovascular complication will be avoided Outcome: Progressing   Problem: Nutrition: Goal: Adequate nutrition will be maintained Outcome: Progressing   Problem: Coping: Goal: Level of anxiety will decrease Outcome: Progressing   Problem: Elimination: Goal: Will not experience complications related to bowel motility Outcome: Progressing Goal: Will not experience complications related to urinary retention Outcome: Progressing   Problem: Pain Management: Goal: General experience of comfort will improve Outcome: Progressing   Problem: Safety: Goal: Ability to remain free from injury will improve Outcome: Progressing   Problem: Bowel/Gastric: Goal: Gut motility will be maintained Outcome: Progressing

## 2023-06-05 NOTE — Progress Notes (Signed)
Patient was not ready to walk in the hall or get a bath this morning d/t pain. Will continue to encourage patient to get OOB.

## 2023-06-05 NOTE — Progress Notes (Signed)
Patient ID: Bailey Reese, female   DOB: 2006-03-14, 18 y.o.   MRN: [id number] Subjective: Bailey Reese is a 18 year old female with a medical history significant for sickle cell disease, PTSD, history of depression with anxiety, and anemia of chronic disease that was admitted for sickle cell pain crisis.   Patient continues to complaints of pain with intensity of 9/10 even on eighth day on admission.  She does not have any new complaints only pain in her lower back and lower extremities.  She denies any fever, cough, chest pain, shortness of breath, nausea, vomiting or diarrhea.  No urinary symptoms.  Objective:  Vital signs in last 24 hours:  Vitals:   06/05/23 1145 06/05/23 1208 06/05/23 1210 06/05/23 1537  BP:   115/62   Pulse:  (!) 118 (!) 115   Resp: 12  18 15   Temp:   98.4 F (36.9 C)   TempSrc:      SpO2: 99% 99% 99% 97%  Weight:      Height:        Intake/Output from previous day:   Intake/Output Summary (Last 24 hours) at 06/05/2023 1737 Last data filed at 06/05/2023 0915 Gross per 24 hour  Intake 120 ml  Output --  Net 120 ml    Physical Exam: General: Alert, awake, oriented x3, in no acute distress.  HEENT: [place]/AT PEERL, EOMI Neck: Trachea midline,  no masses, no thyromegal,y no JVD, no carotid bruit OROPHARYNX:  Moist, No exudate/ erythema/lesions.  Heart: Regular rate and rhythm, without murmurs, rubs, gallops, PMI non-displaced, no heaves or thrills on palpation.  Lungs: Clear to auscultation, no wheezing or rhonchi noted. No increased vocal fremitus resonant to percussion  Abdomen: Soft, nontender, nondistended, positive bowel sounds, no masses no hepatosplenomegaly noted..  Neuro: No focal neurological deficits noted cranial nerves II through XII grossly intact. DTRs 2+ bilaterally upper and lower extremities. Strength 5 out of 5 in bilateral upper and lower extremities. Musculoskeletal: No warm swelling or erythema around joints, no spinal tenderness  noted. Psychiatric: Patient alert and oriented x3, good insight and cognition, good recent to remote recall. Lymph node survey: No cervical axillary or inguinal lymphadenopathy noted.  Lab Results:  Basic Metabolic Panel:    Component Value Date/Time   NA 136 06/03/2023 1053   NA 135 05/01/2013 1440   K 3.8 06/03/2023 1053   K 4.3 05/01/2013 1440   CL 102 06/03/2023 1053   CL 102 05/01/2013 1440   CO2 23 06/03/2023 1053   CO2 27 (H) 05/01/2013 1440   BUN 9 06/03/2023 1053   BUN 13 05/01/2013 1440   CREATININE 0.41 (L) 06/04/2023 0651   CREATININE 0.40 (L) 05/01/2013 1440   GLUCOSE 105 (H) 06/03/2023 1053   GLUCOSE 108 (H) 05/01/2013 1440   CALCIUM 9.5 06/03/2023 1053   CALCIUM 9.8 05/01/2013 1440   CBC:    Component Value Date/Time   WBC 12.6 (H) 06/03/2023 1053   HGB 7.7 (L) 06/03/2023 1053   HGB 6.6 (L) 05/01/2013 1440   HCT 22.2 (L) 06/03/2023 1053   HCT 20.1 (L) 05/01/2013 1440   PLT 524 (H) 06/03/2023 1053   PLT 468 (H) 05/01/2013 1440   MCV 101.4 (H) 06/03/2023 1053   MCV 89 05/01/2013 1440   NEUTROABS 6.6 06/03/2023 1053   NEUTROABS 7.9 05/30/2012 1958   LYMPHSABS 3.2 06/03/2023 1053   LYMPHSABS 5.0 05/30/2012 1958   MONOABS 2.3 (H) 06/03/2023 1053   MONOABS 1.6 (H) 05/30/2012 1958  EOSABS 0.3 06/03/2023 1053   EOSABS 0.6 05/30/2012 1958   BASOSABS 0.1 06/03/2023 1053   BASOSABS 3 04/28/2013 1736   BASOSABS 0.2 (H) 05/30/2012 1958    Recent Results (from the past 240 hours)  Resp panel by RT-PCR (RSV, Flu A&B, Covid) Anterior Nasal Swab     Status: None   Collection Time: 05/27/23 12:22 AM   Specimen: Anterior Nasal Swab  Result Value Ref Range Status   SARS Coronavirus 2 by RT PCR NEGATIVE NEGATIVE Final    Comment: (NOTE) SARS-CoV-2 target nucleic acids are NOT DETECTED.  The SARS-CoV-2 RNA is generally detectable in upper respiratory specimens during the acute phase of infection. The lowest concentration of SARS-CoV-2 viral copies this assay  can detect is 138 copies/mL. A negative result does not preclude SARS-Cov-2 infection and should not be used as the sole basis for treatment or other patient management decisions. A negative result may occur with  improper specimen collection/handling, submission of specimen other than nasopharyngeal swab, presence of viral mutation(s) within the areas targeted by this assay, and inadequate number of viral copies(<138 copies/mL). A negative result must be combined with clinical observations, patient history, and epidemiological information. The expected result is Negative.  Fact Sheet for Patients:  BloggerCourse.com  Fact Sheet for Healthcare Providers:  SeriousBroker.it  This test is no t yet approved or cleared by the [place] FDA and  has been authorized for detection and/or diagnosis of SARS-CoV-2 by FDA under an Emergency Use Authorization (EUA). This EUA will remain  in effect (meaning this test can be used) for the duration of the COVID-19 declaration under Section 564(b)(1) of the Act, 21 U.S.C.section 360bbb-3(b)(1), unless the authorization is terminated  or revoked sooner.       Influenza A by PCR NEGATIVE NEGATIVE Final   Influenza B by PCR NEGATIVE NEGATIVE Final    Comment: (NOTE) The Xpert Xpress SARS-CoV-2/FLU/RSV plus assay is intended as an aid in the diagnosis of influenza from Nasopharyngeal swab specimens and should not be used as a sole basis for treatment. Nasal washings and aspirates are unacceptable for Xpert Xpress SARS-CoV-2/FLU/RSV testing.  Fact Sheet for Patients: BloggerCourse.com  Fact Sheet for Healthcare Providers: SeriousBroker.it  This test is not yet approved or cleared by the [place] FDA and has been authorized for detection and/or diagnosis of SARS-CoV-2 by FDA under an Emergency Use Authorization (EUA). This EUA will  remain in effect (meaning this test can be used) for the duration of the COVID-19 declaration under Section 564(b)(1) of the Act, 21 U.S.C. section 360bbb-3(b)(1), unless the authorization is terminated or revoked.     Resp Syncytial Virus by PCR NEGATIVE NEGATIVE Final    Comment: (NOTE) Fact Sheet for Patients: BloggerCourse.com  Fact Sheet for Healthcare Providers: SeriousBroker.it  This test is not yet approved or cleared by the [place] FDA and has been authorized for detection and/or diagnosis of SARS-CoV-2 by FDA under an Emergency Use Authorization (EUA). This EUA will remain in effect (meaning this test can be used) for the duration of the COVID-19 declaration under Section 564(b)(1) of the Act, 21 U.S.C. section 360bbb-3(b)(1), unless the authorization is terminated or revoked.  Performed at [facility], [address] [place], [place] [address]   Blood culture (routine x 2)     Status: Abnormal   Collection Time: 05/27/23  5:05 AM   Specimen: BLOOD RIGHT ARM  Result Value Ref Range Status   Specimen Description   Final  BLOOD RIGHT ARM Performed at [facility], [address] [place], [place] [address]    Special Requests   Final    BOTTLES DRAWN AEROBIC AND ANAEROBIC Blood Culture results may not be optimal due to an inadequate volume of blood received in culture bottles Performed at [facility], [address], [place], [place] [address]    Culture  Setup Time   Final    IN BOTH AEROBIC AND ANAEROBIC BOTTLES GRAM POSITIVE RODS CONSISTENT WITH PREVIOUS RESULT CRITICAL RESULT CALLED TO, READ BACK BY AND VERIFIED WITH: MADISON HUNT @ 1850 05/27/23 LD Performed at [facility], [address] [place], [place] [address]    Culture (A)  Final    ACTINOMYCES SPECIES LACTOBACILLUS ACIDOPHILUS Standardized susceptibility testing for this organism is not  available. Performed at [facility], [address] [place], [place] [address]    Report Status 05/31/2023 FINAL  Final  Group A Strep by PCR ([facility] Only)     Status: None   Collection Time: 05/27/23  5:07 AM   Specimen: Throat; Sterile Swab  Result Value Ref Range Status   Group A Strep by PCR NOT DETECTED NOT DETECTED Final    Comment: Performed at [facility], [address] [place], [place] [address]  Blood culture (routine x 2)     Status: Abnormal   Collection Time: 05/27/23  5:07 AM   Specimen: BLOOD RIGHT ARM  Result Value Ref Range Status   Specimen Description   Final    BLOOD RIGHT ARM Performed at [facility], [address], [place], [place] [address]    Special Requests   Final    BOTTLES DRAWN AEROBIC AND ANAEROBIC Blood Culture results may not be optimal due to an inadequate volume of blood received in culture bottles Performed at [facility], [address] [place], [place] [address]    Culture  Setup Time   Final    IN BOTH AEROBIC AND ANAEROBIC BOTTLES GRAM POSITIVE RODS CRITICAL RESULT CALLED TO, READ BACK BY AND VERIFIED WITH: MADISON HUNT @ 1850 05/27/23 LFD Performed at [facility], [address] [place], [place] [address]    Culture (A)  Final    BACILLUS SPECIES ACTINOMYCES SPECIES Standardized susceptibility testing for this organism is not available. Performed at [facility], [address], [place], [place] [address]    Report Status 05/31/2023 FINAL  Final    Studies/Results: No results found.  Medications: Scheduled Meds:  enoxaparin (LOVENOX) injection  40 mg Subcutaneous Q24H   HYDROmorphone   Intravenous Q4H   mometasone-formoterol  2 puff Inhalation BID   pregabalin  75 mg Oral TID   senna-docusate  1 tablet Oral BID   Continuous Infusions: PRN Meds:.albuterol, diphenhydrAMINE, fluticasone, LORazepam, naloxone **AND** sodium chloride flush, ondansetron (ZOFRAN) IV,  ondansetron, mouth rinse, oxyCODONE, polyethylene glycol  Consultants: None  Procedures: None  Antibiotics: None  Assessment/Plan: Principal Problem:   Sickle cell anemia with crisis (HCC) Active Problems:   Chronic pain syndrome   Leucocytosis   Thrombocytosis   Obstructive sleep apnea syndrome   GAD (generalized anxiety disorder)   Mild intermittent asthma without complication  Hb Sickle Cell Disease with Pain crisis: IV fluid at [facility].  Continue weight based Dilaudid PCA at current dose setting, completed IV Toradol 5-day course, continue oxycodone 10 mg Q4 hourly as needed for breakthrough pain.  Monitor vitals very closely, Re-evaluate pain scale regularly, 2 L of Oxygen  by [place].  Patient encouraged to ambulate on the hallway today. Thrombocytosis: Stable.  Most likely secondary to sickle cell disease.  Will monitor very closely. Anemia of Chronic Disease: Hemoglobin is stable at baseline today.  There is no clinical indication for blood transfusion at this time.  We will monitor very closely and transfuse as appropriate. Chronic pain Syndrome: Continue home pain medications. Mild intermittent asthma: Stable.  No shortness of breath.  No wheezes.  Will continue to monitor very closely.  Continue home medications.  Code Status: Full Code Family Communication: N/A Disposition Plan: Not yet ready for discharge  Aundra Espin  If 7PM-7AM, please contact night-coverage.  06/05/2023, 5:37 PM  LOS: 8 days

## 2023-06-06 DIAGNOSIS — D5700 Hb-SS disease with crisis, unspecified: Secondary | ICD-10-CM | POA: Diagnosis not present

## 2023-06-06 LAB — COMPREHENSIVE METABOLIC PANEL
ALT: 51 U/L — ABNORMAL HIGH (ref 0–44)
AST: 27 U/L (ref 15–41)
Albumin: 4 g/dL (ref 3.5–5.0)
Alkaline Phosphatase: 173 U/L — ABNORMAL HIGH (ref 38–126)
Anion gap: 8 (ref 5–15)
BUN: 10 mg/dL (ref 6–20)
CO2: 23 mmol/L (ref 22–32)
Calcium: 9.5 mg/dL (ref 8.9–10.3)
Chloride: 105 mmol/L (ref 98–111)
Creatinine, Ser: 0.42 mg/dL — ABNORMAL LOW (ref 0.44–1.00)
GFR, Estimated: >60 mL/min (ref >60)
Glucose, Bld: 96 mg/dL (ref 70–99)
Potassium: 3.9 mmol/L (ref 3.5–5.1)
Sodium: 136 mmol/L (ref 135–145)
Total Bilirubin: 2.5 mg/dL — ABNORMAL HIGH (ref 0.0–1.2)
Total Protein: 8.6 g/dL — ABNORMAL HIGH (ref 6.5–8.1)

## 2023-06-06 LAB — CBC
HCT: 18.4 % — ABNORMAL LOW (ref 36.0–46.0)
Hemoglobin: 6.5 g/dL — CL (ref 12.0–15.0)
MCH: 34.9 pg — ABNORMAL HIGH (ref 26.0–34.0)
MCHC: 35.3 g/dL (ref 30.0–36.0)
MCV: 98.9 fL (ref 80.0–100.0)
Platelets: 554 10*3/uL — ABNORMAL HIGH (ref 150–400)
RBC: 1.86 MIL/uL — ABNORMAL LOW (ref 3.87–5.11)
RDW: 21.1 % — ABNORMAL HIGH (ref 11.5–15.5)
WBC: 18.4 10*3/uL — ABNORMAL HIGH (ref 4.0–10.5)
nRBC: 0.2 % (ref 0.0–0.2)

## 2023-06-06 LAB — HEMOGLOBIN AND HEMATOCRIT, BLOOD
HCT: 21.7 % — ABNORMAL LOW (ref 36.0–46.0)
Hemoglobin: 7.6 g/dL — ABNORMAL LOW (ref 12.0–15.0)

## 2023-06-06 LAB — PREPARE RBC (CROSSMATCH)

## 2023-06-06 MED ORDER — SODIUM CHLORIDE 0.9% IV SOLUTION
Freq: Once | INTRAVENOUS | Status: DC
Start: 2023-06-06 — End: 2023-06-06

## 2023-06-06 NOTE — Progress Notes (Signed)
Patient ID: Bailey Reese, female   DOB: 22-Oct-2005, 18 y.o.   MRN: [id number] Subjective: Bailey Reese is a 18 year old female with a medical history significant for sickle cell disease, PTSD, history of depression with anxiety, and anemia of chronic disease that was admitted for sickle cell pain crisis.   Patient is still complaining of the same pain intensity of 9/10.  Patient has been on admission for 9 days but has seen no significant improvement in her pain level.  There is no objective findings to support this claim.  Patient hemoglobin dropped slightly this morning and denied her ordered blood transfusion.  There is no new complaints.  She denies any fever, cough, chest pain, shortness of breath, nausea, vomiting or diarrhea.  No urinary symptoms.  Objective:  Vital signs in last 24 hours:  Vitals:   06/06/23 1100 06/06/23 1417 06/06/23 1433 06/06/23 1650  BP: 110/61 113/64    Pulse: (!) 105 92    Resp: 10 16 (!) 9 12  Temp: 98.4 F (36.9 C) 98.3 F (36.8 C)    TempSrc: Oral Oral    SpO2: 98% 100% 100% 100%  Weight:      Height:        Intake/Output from previous day:   Intake/Output Summary (Last 24 hours) at 06/06/2023 1745 Last data filed at 06/06/2023 1500 Gross per 24 hour  Intake 912.33 ml  Output --  Net 912.33 ml    Physical Exam: General: Alert, awake, oriented x3, in no acute distress.  HEENT: [place]/AT PEERL, EOMI Neck: Trachea midline,  no masses, no thyromegal,y no JVD, no carotid bruit OROPHARYNX:  Moist, No exudate/ erythema/lesions.  Heart: Regular rate and rhythm, without murmurs, rubs, gallops, PMI non-displaced, no heaves or thrills on palpation.  Lungs: Clear to auscultation, no wheezing or rhonchi noted. No increased vocal fremitus resonant to percussion  Abdomen: Soft, nontender, nondistended, positive bowel sounds, no masses no hepatosplenomegaly noted..  Neuro: No focal neurological deficits noted cranial nerves II through XII grossly  intact. DTRs 2+ bilaterally upper and lower extremities. Strength 5 out of 5 in bilateral upper and lower extremities. Musculoskeletal: No warm swelling or erythema around joints, no spinal tenderness noted. Psychiatric: Patient alert and oriented x3, good insight and cognition, good recent to remote recall. Lymph node survey: No cervical axillary or inguinal lymphadenopathy noted.  Lab Results:  Basic Metabolic Panel:    Component Value Date/Time   NA 136 06/06/2023 0517   NA 135 05/01/2013 1440   K 3.9 06/06/2023 0517   K 4.3 05/01/2013 1440   CL 105 06/06/2023 0517   CL 102 05/01/2013 1440   CO2 23 06/06/2023 0517   CO2 27 (H) 05/01/2013 1440   BUN 10 06/06/2023 0517   BUN 13 05/01/2013 1440   CREATININE 0.42 (L) 06/06/2023 0517   CREATININE 0.40 (L) 05/01/2013 1440   GLUCOSE 96 06/06/2023 0517   GLUCOSE 108 (H) 05/01/2013 1440   CALCIUM 9.5 06/06/2023 0517   CALCIUM 9.8 05/01/2013 1440   CBC:    Component Value Date/Time   WBC 18.4 (H) 06/06/2023 0517   HGB 6.5 (LL) 06/06/2023 0517   HGB 6.6 (L) 05/01/2013 1440   HCT 18.4 (L) 06/06/2023 0517   HCT 20.1 (L) 05/01/2013 1440   PLT 554 (H) 06/06/2023 0517   PLT 468 (H) 05/01/2013 1440   MCV 98.9 06/06/2023 0517   MCV 89 05/01/2013 1440   NEUTROABS 6.6 06/03/2023 1053   NEUTROABS 7.9 05/30/2012 1958  LYMPHSABS 3.2 06/03/2023 1053   LYMPHSABS 5.0 05/30/2012 1958   MONOABS 2.3 (H) 06/03/2023 1053   MONOABS 1.6 (H) 05/30/2012 1958   EOSABS 0.3 06/03/2023 1053   EOSABS 0.6 05/30/2012 1958   BASOSABS 0.1 06/03/2023 1053   BASOSABS 3 04/28/2013 1736   BASOSABS 0.2 (H) 05/30/2012 1958    No results found for this or any previous visit (from the past 240 hours).   Studies/Results: No results found.  Medications: Scheduled Meds:  enoxaparin (LOVENOX) injection  40 mg Subcutaneous Q24H   HYDROmorphone   Intravenous Q4H   mometasone-formoterol  2 puff Inhalation BID   pregabalin  75 mg Oral TID   senna-docusate  1  tablet Oral BID   Continuous Infusions: PRN Meds:.albuterol, diphenhydrAMINE, fluticasone, LORazepam, naloxone **AND** sodium chloride flush, ondansetron (ZOFRAN) IV, ondansetron, mouth rinse, oxyCODONE, polyethylene glycol  Consultants: None  Procedures: None  Antibiotics: None  Assessment/Plan: Principal Problem:   Sickle cell anemia with crisis (HCC) Active Problems:   Chronic pain syndrome   Leucocytosis   Thrombocytosis   Obstructive sleep apnea syndrome   GAD (generalized anxiety disorder)   Mild intermittent asthma without complication  Hb Sickle Cell Disease with Pain crisis: IV fluid at [facility].  Continue weight based Dilaudid PCA at current dose setting, patient has completed IV Toradol 5-day course, continue oxycodone 10 mg Q4 hourly as needed for breakthrough pain.  Monitor vitals very closely, Re-evaluate pain scale regularly, 2 L of Oxygen by [place].  Patient encouraged to ambulate on the hallway today. Thrombocytosis: Stable. Most likely secondary to sickle cell disease.  Will monitor very closely. Anemia of Chronic Disease: Hemoglobin has dropped below baseline today to 6.5.  She is being transfused 1 unit of packed red blood cells today to bring hemoglobin above 7.  Continue to monitor closely after transfusion.  Repeat labs in AM. Chronic pain Syndrome: Continue home pain medications. Mild intermittent asthma: Stable. No shortness of breath. No wheezes.  Will continue to monitor very closely. Continue home medications.  Code Status: Full Code Family Communication: N/A Disposition Plan: For possible discharge home tomorrow morning, 06/07/2023.  Rmoni Keplinger  If 7PM-7AM, please contact night-coverage.  06/06/2023, 5:45 PM  LOS: 9 days

## 2023-06-06 NOTE — Plan of Care (Signed)
  Problem: Clinical Measurements: Goal: Respiratory complications will improve Outcome: Progressing Goal: Cardiovascular complication will be avoided Outcome: Progressing   Problem: Activity: Goal: Risk for activity intolerance will decrease Outcome: Progressing   Problem: Nutrition: Goal: Adequate nutrition will be maintained Outcome: Progressing   Problem: Coping: Goal: Level of anxiety will decrease Outcome: Progressing   Problem: Elimination: Goal: Will not experience complications related to bowel motility Outcome: Progressing Goal: Will not experience complications related to urinary retention Outcome: Progressing   Problem: Pain Management: Goal: General experience of comfort will improve Outcome: Progressing   Problem: Safety: Goal: Ability to remain free from injury will improve Outcome: Progressing   Problem: Skin Integrity: Goal: Risk for impaired skin integrity will decrease Outcome: Progressing   Problem: Bowel/Gastric: Goal: Gut motility will be maintained Outcome: Progressing   Problem: Respiratory: Goal: Pulmonary complications will be avoided or minimized Outcome: Progressing

## 2023-06-06 NOTE — Progress Notes (Signed)
Date and time results received: 06/06/23 0630   Test: hgb Critical Value: 6.5  Name of Provider Notified: Chinita Greenland NP  Orders Received? Or Actions Taken?: awaiting orders

## 2023-06-06 NOTE — Progress Notes (Addendum)
Patient's temp is 100. Notified J. Garner Nash, NP. He said to place ice packs under arms and reduce temperature in the room and we also encouraged the incentive spirometer. Will continue to monitor.

## 2023-06-06 NOTE — Progress Notes (Signed)
Patient requesting an extra dose of PO benadryl d/t itching. Notified J. Garner Nash, NP. Will continue to monitor.

## 2023-06-07 DIAGNOSIS — D5700 Hb-SS disease with crisis, unspecified: Secondary | ICD-10-CM | POA: Diagnosis not present

## 2023-06-07 LAB — TYPE AND SCREEN
ABO/RH(D): B POS
Antibody Screen: NEGATIVE
Unit division: 0

## 2023-06-07 LAB — CBC
HCT: 22.4 % — ABNORMAL LOW (ref 36.0–46.0)
HCT: 20.5 % — ABNORMAL LOW (ref 36.0–46.0)
Hemoglobin: 7.9 g/dL — ABNORMAL LOW (ref 12.0–15.0)
Hemoglobin: 7.1 g/dL — ABNORMAL LOW (ref 12.0–15.0)
MCH: 33.8 pg (ref 26.0–34.0)
MCH: 33.8 pg (ref 26.0–34.0)
MCHC: 35.3 g/dL (ref 30.0–36.0)
MCHC: 34.6 g/dL (ref 30.0–36.0)
MCV: 95.7 fL (ref 80.0–100.0)
MCV: 97.6 fL (ref 80.0–100.0)
Platelets: 616 10*3/uL — ABNORMAL HIGH (ref 150–400)
Platelets: 575 10*3/uL — ABNORMAL HIGH (ref 150–400)
RBC: 2.34 MIL/uL — ABNORMAL LOW (ref 3.87–5.11)
RBC: 2.1 MIL/uL — ABNORMAL LOW (ref 3.87–5.11)
RDW: 22.1 % — ABNORMAL HIGH (ref 11.5–15.5)
RDW: 22.1 % — ABNORMAL HIGH (ref 11.5–15.5)
WBC: 15.9 10*3/uL — ABNORMAL HIGH (ref 4.0–10.5)
WBC: 14 10*3/uL — ABNORMAL HIGH (ref 4.0–10.5)
nRBC: 0.8 % — ABNORMAL HIGH (ref 0.0–0.2)
nRBC: 1.3 % — ABNORMAL HIGH (ref 0.0–0.2)

## 2023-06-07 LAB — BPAM RBC
Blood Product Expiration Date: 202502142359
ISSUE DATE / TIME: 202501211032
Unit Type and Rh: 9500

## 2023-06-07 MED ORDER — OXYCODONE HCL ER 10 MG PO T12A
10.0000 mg | EXTENDED_RELEASE_TABLET | Freq: Two times a day (BID) | ORAL | Status: DC
  Administered 2023-06-07 – 2023-06-08 (×3): 10 mg via ORAL
  Filled 2023-06-07 (×3): qty 1

## 2023-06-07 NOTE — Plan of Care (Signed)

## 2023-06-07 NOTE — Progress Notes (Addendum)
Patient has had a mild nose bleed every night for the past 4 nights. I asked her if I could help her with them each night but she stated that she was letting them clot and that she would be okay. Also offered to change her sheets and gown d/t blood being on them d/t to the epitaxis. She stated that she would wait just in case her nose started bleeding again. Will continue to monitor.   0500 - Patient allowed me to change her gown and blanket due to her previous bloody nose. She showed me a tiny piece of "cartilage" that came from her nose when she sneezed. She stated that she found the piece of "cartilage" from the blood clot that came from her nose when she sneezed. Will continue to monitor.

## 2023-06-07 NOTE — Progress Notes (Signed)
SICKLE CELL SERVICE PROGRESS NOTE  Bailey Reese ZOX:[id number] DOB: Oct 02, 2005 DOA: 05/28/2023 PCP: System, Provider Not In  Assessment/Plan: Principal Problem:   Sickle cell anemia with crisis (HCC) Active Problems:   Chronic pain syndrome   Leucocytosis   Thrombocytosis   Obstructive sleep apnea syndrome   GAD (generalized anxiety disorder)   Mild intermittent asthma without complication  Sickle cell pain crisis: Patient is still having 9 out of 10 pain.  Currently on Dilaudid PCA, Toradol and IV fluids.  Patient reported that she has had very slow recovery whenever she has attacks.  I have discussed with the patient and her mother.  Patient's pain is more than likely complicated with chronic pain syndrome.  She does not want to take the prescribed long-acting medication which is Suboxone.  She is currently only on short acting which makes control of her pain very difficult.  I have explained that she needs to be on long-acting plus the short acting so we can get her off the Dilaudid PCA.  At the moment I am starting her on OxyContin which she will continue to have with her PCP when she goes home.  Patient said she was on long-acting before including Xtampza but this switched her to the Suboxone that she could not tolerate.  We will MA discharging patient home in the morning with the long-acting she can follow-up with her physicians to see if they would like to continue with it. Anemia of chronic disease: Continue to monitor H&H Obstructive sleep apnea: Not on CPAP.  Counseling provided. Chronic pain syndrome: Patient appears to have significant chronic pain.  She was on Suboxone and oxycodone at home.  She is refusing the Suboxone here.  I have discussed with her that she will need to get something for long-acting.  She apparently was on Xtampza before but was discontinued by her physicians.  I am trying to get information from care everywhere to determine real reason why patient was taken  off Xtampza.  Continue chronic pain management. Mild intermittent asthma: Continue empiric treatment with breathing treatment Leukocytosis: Secondary to vaso-occlusive crisis.  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 If 7PM-7AM, please contact night-coverage.  06/07/2023, 10:00 PM  LOS: 10 days   Brief narrative: Bailey Reese is a 18 y.o. female with medical history significant of sickle cell disease, PTSD, depression with anxiety who usually gets her care at [facility] but was seen at [facility] for the last 48 hours where she stayed in the ER with sickle cell pain crisis.  Patient was being managed in the ER for crisis.  She was waiting for a bed to go to [facility] where she normally gets her care however there was no bed available.  Patient was transferred to our care here for continued sickle cell crisis.  Patient reported to have pain similar to her typical sickle cell crisis.  Is mainly in her legs and back.  She has vitals that are stable.  Hemoglobin is around 8.4's.  She was getting IV Dilaudid pushes in the ER.  Patient denied any nausea vomiting or diarrhea.  Denied any viral illness.  She has had previous osteomyelitis and multiple episodes of acute chest syndromes.  Also hemorrhagic stroke in 2017.  Patient has chronic pain from her sickle cell disease.  She has been admitted for sickle cell  pain management.   Consultants: None  Procedures: None  Antibiotics: None  HPI/Subjective: Patient's pain is still at 9 out of 10 mainly in her legs and back.  Patient is in tears saying her pain is not controlled and she will want to get more time for it to be controlled.  Objective: Vitals:   06/07/23 1551 06/07/23 1822 06/07/23 2028 06/07/23 2051  BP:  107/61  118/61  Pulse:  90  84  Resp: 16 14 14 18    Temp:  98.5 F (36.9 C)  98.1 F (36.7 C)  TempSrc:  Oral  Oral  SpO2: 100% 99% 99% 100%  Weight:      Height:       Weight change:   Intake/Output Summary (Last 24 hours) at 06/07/2023 2200 Last data filed at 06/07/2023 1900 Gross per 24 hour  Intake 480 ml  Output --  Net 480 ml     General: Alert, awake, oriented x3, in no acute distress.  HEENT: [place]/AT PEERL, EOMI Neck: Trachea midline,  no masses, no thyromegal,y no JVD, no carotid bruit OROPHARYNX:  Moist, No exudate/ erythema/lesions.  Heart: Regular rate and rhythm, without murmurs, rubs, gallops, PMI non-displaced, no heaves or thrills on palpation.  Lungs: Clear to auscultation, no wheezing or rhonchi noted. No increased vocal fremitus resonant to percussion  Abdomen: Soft, nontender, nondistended, positive bowel sounds, no masses no hepatosplenomegaly noted..  Neuro: No focal neurological deficits noted cranial nerves II through XII grossly intact. DTRs 2+ bilaterally upper and lower extremities. Strength 5 out of 5 in bilateral upper and lower extremities. Musculoskeletal: No warm swelling or erythema around joints, no spinal tenderness noted. Psychiatric: Patient alert and oriented x3, good insight and cognition, good recent to remote recall. Lymph node survey: No cervical axillary or inguinal lymphadenopathy noted.   Data Reviewed: Basic Metabolic Panel: Recent Labs  Lab 06/03/23 1053 06/04/23 0651 06/06/23 0517  NA 136  --  136  K 3.8  --  3.9  CL 102  --  105  CO2 23  --  23  GLUCOSE 105*  --  96  BUN 9  --  10  CREATININE 0.38* 0.41* 0.42*  CALCIUM 9.5  --  9.5   Liver Function Tests: Recent Labs  Lab 06/03/23 1053 06/06/23 0517  AST 31 27  ALT 74* 51*  ALKPHOS 216* 173*  BILITOT 2.2* 2.5*  PROT 8.2* 8.6*  ALBUMIN 4.0 4.0   No results for input(s): "LIPASE", "AMYLASE" in the last 168 hours. No results for input(s): "AMMONIA" in the last 168 hours. CBC: Recent Labs  Lab 06/03/23 1053  06/06/23 0517 06/06/23 1742 06/07/23 0546 06/07/23 1031  WBC 12.6* 18.4*  --  15.9* 14.0*  NEUTROABS 6.6  --   --   --   --   HGB 7.7* 6.5* 7.6* 7.9* 7.1*  HCT 22.2* 18.4* 21.7* 22.4* 20.5*  MCV 101.4* 98.9  --  95.7 97.6  PLT 524* 554*  --  616* 575*   Cardiac Enzymes: No results for input(s): "CKTOTAL", "CKMB", "CKMBINDEX", "TROPONINI" in the last 168 hours. BNP (last 3 results) No results for input(s): "BNP" in the last 8760 hours.  ProBNP (last 3 results) No results for input(s): "PROBNP" in the last 8760 hours.  CBG: Recent Labs  Lab 05/31/23 2216  GLUCAP 108*    No results found for this or any previous visit (from the past 240 hours).    Studies: DG CHEST PORT 1 VIEW Result Date: 05/31/2023 CLINICAL  DATA:  Increasing shortness of breath EXAM: PORTABLE CHEST 1 VIEW COMPARISON:  05/27/2023 FINDINGS: Cardiomegaly with interim opacity at the left base. Globular cardiac configuration. Linear atelectasis or scarring in the left mid lung. IMPRESSION: Cardiomegaly with interim opacity at the left base which may be due to atelectasis or pneumonia. Electronically Signed   By: Jasmine Pang M.D.   On: 05/31/2023 23:12   CT Angio Chest PE W and/or Wo Contrast Result Date: 05/27/2023 CLINICAL DATA:  18 year old with history of sickle cell disease, currently in sickle cell crisis with generalized body pain. EXAM: CT ANGIOGRAPHY CHEST WITH CONTRAST TECHNIQUE: Multidetector CT imaging of the chest was performed using the standard protocol during bolus administration of intravenous contrast. Multiplanar CT image reconstructions and MIPs were obtained to evaluate the vascular anatomy. RADIATION DOSE REDUCTION: This exam was performed according to the departmental dose-optimization program which includes automated exposure control, adjustment of the mA and/or kV according to patient size and/or use of iterative reconstruction technique. CONTRAST:  75mL OMNIPAQUE IOHEXOL 350 MG/ML SOLN  COMPARISON:  Portable chest from today, PA and lateral chest 05/16/2021. No prior cross-sectional imaging. FINDINGS: Cardiovascular: The aorta and great vessels are normal. The pulmonary trunk is prominent measuring 3.0 cm suggesting arterial hypertension, but no arterial embolism is seen. There is mild-to-moderate panchamber cardiomegaly without findings of acute right heart strain. Small pericardial effusion. Pulmonary veins are normal caliber. Mediastinum/Nodes: There are prominent palatine tonsils abutting the uvula, partially visible on the first few slices. Correlate clinically for pharyngitis. Thyroid gland and axillary spaces are unremarkable. There is residual thymus in the anterior mediastinum. No intrathoracic adenopathy is seen. The thoracic trachea, thoracic esophagus and main bronchi are unremarkable. Lungs/Pleura: There are symmetric trace pleural effusions. There is a low inspiration on exam. Interstitial and patchy opacities of the bilateral lower lobes could be due to pneumonia, aspiration, or low inspiration. In the upper lobes, other is mosaic attenuation which can be seen with small airways disease with air trapping, but no focal infiltrate. The right middle lobe is clear. Upper Abdomen: Status post cholecystectomy. No acute findings. There is a nonspecific 1 cm hypodense lesion noted in the superior spleen, another more inferiorly measuring 1.8 cm. Musculoskeletal: Mild thoracic kyphodextroscoliosis. There are central endplate defects in some of the thoracic vertebrae consistent with history of sickle cell disease. 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 No other significant osseous findings.  No chest wall mass is seen. Review of the MIP images confirms the above findings. IMPRESSION: 1. No evidence of arterial embolus. 2. Prominent pulmonary trunk suggesting arterial hypertension. 3. Cardiomegaly with small pericardial effusion. 4. Trace pleural effusions. 5. Interstitial  and patchy opacities in the lower lobes which could be due to pneumonia, aspiration, or low inspiration. 6. Mosaic attenuation in the upper lobes which can be seen with small airways disease with air trapping. 7. Prominent palatine tonsils abutting the uvula. Correlate clinically for pharyngitis. 8. Nonspecific 1 cm and 1.8 cm hypodense lesions in the spleen. Follow-up MRI with and without contrast recommended. 9. Avascular necrosis superior right humeral head. 10. Central endplate defects in some of the thoracic vertebrae consistent with history of sickle cell disease. Electronically Signed   By: Almira Bar M.D.   On: 05/27/2023 04:01   DG Chest Portable 1 View Result Date: 05/27/2023 CLINICAL DATA:  Sickle cell crisis with generalized body pain. EXAM: PORTABLE CHEST 1 VIEW COMPARISON:  May 16, 2021 FINDINGS: The heart size and mediastinal contours are within normal limits. Breast  attenuation artifact is seen overlying the bilateral lung bases. Both lungs are otherwise clear. The visualized skeletal structures are unremarkable. IMPRESSION: No active disease. Electronically Signed   By: Aram Candela M.D.   On: 05/27/2023 01:18    Scheduled Meds:  enoxaparin (LOVENOX) injection  40 mg Subcutaneous Q24H   HYDROmorphone   Intravenous Q4H   mometasone-formoterol  2 puff Inhalation BID   oxyCODONE  10 mg Oral Q12H   pregabalin  75 mg Oral TID   senna-docusate  1 tablet Oral BID   Continuous Infusions:    Principal Problem:   Sickle cell anemia with crisis (HCC) Active Problems:   Chronic pain syndrome   Leucocytosis   Thrombocytosis   Obstructive sleep apnea syndrome   GAD (generalized anxiety disorder)   Mild intermittent asthma without complication

## 2023-06-08 DIAGNOSIS — D5700 Hb-SS disease with crisis, unspecified: Secondary | ICD-10-CM | POA: Diagnosis not present

## 2023-06-08 MED ORDER — OXYCODONE HCL ER 10 MG PO T12A: 10.0000 mg | EXTENDED_RELEASE_TABLET | Freq: Two times a day (BID) | ORAL | 0 refills | Status: DC

## 2023-06-08 NOTE — Hospital Course (Signed)
Patient was transferred from [facility] with sickle cell pain crisis.  She normally gets her care at [facility] but could not get there due to lack of beds.  Admitted with sickle cell pain rated as 10 out of 10.  Patient was supposed to be on Suboxone for long-acting pain medication which she turned out.  Started on Dilaudid PCA, Toradol, IV fluids and oral oxycodone.  Despite about 10 days of this regimen patient continues to have pain at 10 out of 10.  Conversation with patient and mother was held.  Patient's pain was not controlled with a long-acting medicine.  Patient used to be on Xtampza ER prior to admission which was changed to the Suboxone.  Since she is no use of the Suboxone we have temporarily started her on OxyContin.  She will be on 10 mg twice daily.  Will refer patient back to her physicians to decide on what to use for long-acting.

## 2023-06-08 NOTE — Plan of Care (Signed)
  Problem: Clinical Measurements: Goal: Will remain free from infection Outcome: Progressing Goal: Diagnostic test results will improve Outcome: Progressing   Problem: Activity: Goal: Risk for activity intolerance will decrease Outcome: Progressing   Problem: Elimination: Goal: Will not experience complications related to bowel motility Outcome: Progressing Goal: Will not experience complications related to urinary retention Outcome: Progressing   Problem: Pain Management: Goal: General experience of comfort will improve Outcome: Progressing

## 2023-06-08 NOTE — Plan of Care (Signed)
  Problem: Education: Goal: Knowledge of General Education information will improve Description: Including pain rating scale, medication(s)/side effects and non-pharmacologic comfort measures Outcome: Progressing   Problem: Health Behavior/Discharge Planning: Goal: Ability to manage health-related needs will improve Outcome: Progressing   Problem: Clinical Measurements: Goal: Ability to maintain clinical measurements within normal limits will improve Outcome: Progressing Goal: Will remain free from infection Outcome: Progressing Goal: Diagnostic test results will improve Outcome: Progressing Goal: Respiratory complications will improve Outcome: Progressing Goal: Cardiovascular complication will be avoided Outcome: Progressing   Problem: Activity: Goal: Risk for activity intolerance will decrease Outcome: Progressing   Problem: Nutrition: Goal: Adequate nutrition will be maintained Outcome: Progressing   Problem: Coping: Goal: Level of anxiety will decrease Outcome: Progressing   Problem: Elimination: Goal: Will not experience complications related to bowel motility Outcome: Progressing Goal: Will not experience complications related to urinary retention Outcome: Progressing   Problem: Pain Management: Goal: General experience of comfort will improve Outcome: Progressing   Problem: Safety: Goal: Ability to remain free from injury will improve Outcome: Progressing   Problem: Education: Goal: Knowledge of vaso-occlusive preventative measures will improve Outcome: Progressing Goal: Awareness of infection prevention will improve Outcome: Progressing Goal: Awareness of signs and symptoms of anemia will improve Outcome: Progressing Goal: Long-term complications will improve Outcome: Progressing   Problem: Skin Integrity: Goal: Risk for impaired skin integrity will decrease Outcome: Progressing   Problem: Self-Care: Goal: Ability to incorporate actions that  prevent/reduce pain crisis will improve Outcome: Progressing   Problem: Bowel/Gastric: Goal: Gut motility will be maintained Outcome: Progressing   Problem: Tissue Perfusion: Goal: Complications related to inadequate tissue perfusion will be avoided or minimized Outcome: Progressing   Problem: Respiratory: Goal: Pulmonary complications will be avoided or minimized Outcome: Progressing Goal: Acute Chest Syndrome will be identified early to prevent complications Outcome: Progressing   Problem: Fluid Volume: Goal: Ability to maintain a balanced intake and output will improve Outcome: Progressing   Problem: Sensory: Goal: Pain level will decrease with appropriate interventions Outcome: Progressing   Problem: Health Behavior: Goal: Postive changes in compliance with treatment and prescription regimens will improve Outcome: Progressing

## 2023-06-08 NOTE — Progress Notes (Signed)
Pt observed with mild epitaxis this morning, declined assistance and declined gown/linen to be changed.

## 2023-06-08 NOTE — Discharge Summary (Signed)
Physician Discharge Summary   Patient: Bailey Reese MRN: [id number] DOB: 08-22-2005  Admit date:     05/28/2023  Discharge date: 06/08/2023  Discharge Physician: Lonia Blood   PCP: System, Provider Not In   Recommendations at discharge:   Patient discharged home on OxyContin and oral oxycodone.  She has chronic pain syndrome and need to be on both long-acting and short acting medications Follow-up with your PCP on the management of chronic pain  Discharge Diagnoses: Principal Problem:   Sickle cell anemia with crisis (HCC) Active Problems:   Chronic pain syndrome   Leucocytosis   Thrombocytosis   Obstructive sleep apnea syndrome   GAD (generalized anxiety disorder)   Mild intermittent asthma without complication  Resolved Problems:   * No resolved hospital problems. [facility] Course: Patient was transferred from [facility] with sickle cell pain crisis.  She normally gets her care at [facility] but could not get there due to lack of beds.  Admitted with sickle cell pain rated as 10 out of 10.  Patient was supposed to be on Suboxone for long-acting pain medication which she turned out.  Started on Dilaudid PCA, Toradol, IV fluids and oral oxycodone.  Despite about 10 days of this regimen patient continues to have pain at 10 out of 10.  Conversation with patient and mother was held.  Patient's pain was not controlled with a long-acting medicine.  Patient used to be on Xtampza ER prior to admission which was changed to the Suboxone.  Since she is no use of the Suboxone we have temporarily started her on OxyContin.  She will be on 10 mg twice daily.  Will refer patient back to her physicians to decide on what to use for long-acting.  Assessment and Plan: No notes have been filed under this hospital service. Service: Hospitalist        Consultants: None Procedures performed: CT angiogram of the chest, chest x-ray Disposition: Home Diet  recommendation:  Discharge Diet Orders (From admission, onward)     Start     Ordered   06/08/23 0000  Diet - low sodium heart healthy        06/08/23 1439           Regular diet DISCHARGE MEDICATION: Allergies as of 06/08/2023       Reactions   Other Swelling   Pitted fruits   Prunus Persica Swelling   Lips swell    Cherry Itching   Lips itch   Cherry Itching, Swelling, Other (See Comments)   Mouth and throat get itchy & lips swell; throat tightens   Morphine And Codeine Hives, Itching   Other Itching, Swelling, Other (See Comments)   PITTED FRUITS = Mouth and throat get itchy & lips swell; throat tightens   Peach [prunus Persica] Itching, Swelling, Other (See Comments)   Mouth and throat get itchy & lips swell; throat tightens   Peanut Butter Flavoring Agent (non-screening) Itching   Per pt "mouth gets itchy"   Peanut-containing Drug Products Itching, Swelling, Other (See Comments)   Mouth and throat get itchy & lips swell; throat tightens; chest tightens   Plum Pulp Itching, Swelling, Other (See Comments)   Mouth and throat get itchy & lips swell; throat tightens        Medication List     STOP taking these medications    buprenorphine-naloxone 8-2 mg Subl SL tablet Commonly known as: SUBOXONE       TAKE these medications  budesonide-formoterol 80-4.5 MCG/ACT inhaler Commonly known as: SYMBICORT Inhale 2 puffs into the lungs 2 (two) times daily as needed (wheezing, shortness of breath).   fexofenadine 180 MG tablet Commonly known as: ALLEGRA Take 180 mg by mouth daily as needed for allergies.   fluticasone 50 MCG/ACT nasal spray Commonly known as: FLONASE Place 1 spray into both nostrils daily as needed.   OLANZapine zydis 5 MG disintegrating tablet Commonly known as: ZYPREXA Take 5 mg by mouth at bedtime.   Oxycodone HCl 10 MG Tabs Take 10 mg by mouth every 6 (six) hours as needed (pain). What changed: Another medication with the same name  was added. Make sure you understand how and when to take each.   oxyCODONE 10 mg 12 hr tablet Commonly known as: OXYCONTIN Take 1 tablet (10 mg total) by mouth every 12 (twelve) hours. What changed: You were already taking a medication with the same name, and this prescription was added. Make sure you understand how and when to take each.   pregabalin 75 MG capsule Commonly known as: LYRICA Take 75 mg by mouth 3 (three) times daily.   promethazine 12.5 MG tablet Commonly known as: PHENERGAN Take 12.5 mg by mouth every 6 (six) hours as needed.        Discharge Exam: Filed Weights   05/28/23 1327  Weight: 70.3 kg   Constitutional: NAD, calm, comfortable Eyes: PERRL, lids and conjunctivae normal ENMT: Mucous membranes are moist. Posterior pharynx clear of any exudate or lesions.Normal dentition.  Neck: normal, supple, no masses, no thyromegaly Respiratory: clear to auscultation bilaterally, no wheezing, no crackles. Normal respiratory effort. No accessory muscle use.  Cardiovascular: Regular rate and rhythm, no murmurs / rubs / gallops. No extremity edema. 2+ pedal pulses. No carotid bruits.  Abdomen: no tenderness, no masses palpated. No hepatosplenomegaly. Bowel sounds positive.  Musculoskeletal: Good range of motion, no joint swelling or tenderness, Skin: no rashes, lesions, ulcers. No induration Neurologic: CN 2-12 grossly intact. Sensation intact, DTR normal. Strength 5/5 in all 4.  Psychiatric: Normal judgment and insight. Alert and oriented x 3. Normal mood   Condition at discharge: good  The results of significant diagnostics from this hospitalization (including imaging, microbiology, ancillary and laboratory) are listed below for reference.   Imaging Studies: DG CHEST PORT 1 VIEW Result Date: 05/31/2023 CLINICAL DATA:  Increasing shortness of breath EXAM: PORTABLE CHEST 1 VIEW COMPARISON:  05/27/2023 FINDINGS: Cardiomegaly with interim opacity at the left base.  Globular cardiac configuration. Linear atelectasis or scarring in the left mid lung. IMPRESSION: Cardiomegaly with interim opacity at the left base which may be due to atelectasis or pneumonia. Electronically Signed   By: Jasmine Pang M.D.   On: 05/31/2023 23:12   CT Angio Chest PE W and/or Wo Contrast Result Date: 05/27/2023 CLINICAL DATA:  18 year old with history of sickle cell disease, currently in sickle cell crisis with generalized body pain. EXAM: CT ANGIOGRAPHY CHEST WITH CONTRAST TECHNIQUE: Multidetector CT imaging of the chest was performed using the standard protocol during bolus administration of intravenous contrast. Multiplanar CT image reconstructions and MIPs were obtained to evaluate the vascular anatomy. RADIATION DOSE REDUCTION: This exam was performed according to the departmental dose-optimization program which includes automated exposure control, adjustment of the mA and/or kV according to patient size and/or use of iterative reconstruction technique. CONTRAST:  75mL OMNIPAQUE IOHEXOL 350 MG/ML SOLN COMPARISON:  Portable chest from today, PA and lateral chest 05/16/2021. No prior cross-sectional imaging. FINDINGS: Cardiovascular: The aorta and great  vessels are normal. The pulmonary trunk is prominent measuring 3.0 cm suggesting arterial hypertension, but no arterial embolism is seen. There is mild-to-moderate panchamber cardiomegaly without findings of acute right heart strain. Small pericardial effusion. Pulmonary veins are normal caliber. Mediastinum/Nodes: There are prominent palatine tonsils abutting the uvula, partially visible on the first few slices. Correlate clinically for pharyngitis. Thyroid gland and axillary spaces are unremarkable. There is residual thymus in the anterior mediastinum. No intrathoracic adenopathy is seen. The thoracic trachea, thoracic esophagus and main bronchi are unremarkable. Lungs/Pleura: There are symmetric trace pleural effusions. There is a low  inspiration on exam. Interstitial and patchy opacities of the bilateral lower lobes could be due to pneumonia, aspiration, or low inspiration. In the upper lobes, other is mosaic attenuation which can be seen with small airways disease with air trapping, but no focal infiltrate. The right middle lobe is clear. Upper Abdomen: Status post cholecystectomy. No acute findings. There is a nonspecific 1 cm hypodense lesion noted in the superior spleen, another more inferiorly measuring 1.8 cm. Musculoskeletal: Mild thoracic kyphodextroscoliosis. There are central endplate defects in some of the thoracic vertebrae consistent with history of sickle cell disease. 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 No other significant osseous findings.  No chest wall mass is seen. Review of the MIP images confirms the above findings. IMPRESSION: 1. No evidence of arterial embolus. 2. Prominent pulmonary trunk suggesting arterial hypertension. 3. Cardiomegaly with small pericardial effusion. 4. Trace pleural effusions. 5. Interstitial and patchy opacities in the lower lobes which could be due to pneumonia, aspiration, or low inspiration. 6. Mosaic attenuation in the upper lobes which can be seen with small airways disease with air trapping. 7. Prominent palatine tonsils abutting the uvula. Correlate clinically for pharyngitis. 8. Nonspecific 1 cm and 1.8 cm hypodense lesions in the spleen. Follow-up MRI with and without contrast recommended. 9. Avascular necrosis superior right humeral head. 10. Central endplate defects in some of the thoracic vertebrae consistent with history of sickle cell disease. Electronically Signed   By: Almira Bar M.D.   On: 05/27/2023 04:01   DG Chest Portable 1 View Result Date: 05/27/2023 CLINICAL DATA:  Sickle cell crisis with generalized body pain. EXAM: PORTABLE CHEST 1 VIEW COMPARISON:  May 16, 2021 FINDINGS: The heart size and mediastinal contours are within normal limits.  Breast attenuation artifact is seen overlying the bilateral lung bases. Both lungs are otherwise clear. The visualized skeletal structures are unremarkable. IMPRESSION: No active disease. Electronically Signed   By: Aram Candela M.D.   On: 05/27/2023 01:18    Microbiology: Results for orders placed or performed during the hospital encounter of 05/27/23  Resp panel by RT-PCR (RSV, Flu A&B, Covid) Anterior Nasal Swab     Status: None   Collection Time: 05/27/23 12:22 AM   Specimen: Anterior Nasal Swab  Result Value Ref Range Status   SARS Coronavirus 2 by RT PCR NEGATIVE NEGATIVE Final    Comment: (NOTE) SARS-CoV-2 target nucleic acids are NOT DETECTED.  The SARS-CoV-2 RNA is generally detectable in upper respiratory specimens during the acute phase of infection. The lowest concentration of SARS-CoV-2 viral copies this assay can detect is 138 copies/mL. A negative result does not preclude SARS-Cov-2 infection and should not be used as the sole basis for treatment or other patient management decisions. A negative result may occur with  improper specimen collection/handling, submission of specimen other than nasopharyngeal swab, presence of viral mutation(s) within the areas targeted by this assay, and inadequate  number of viral copies(<138 copies/mL). A negative result must be combined with clinical observations, patient history, and epidemiological information. The expected result is Negative.  Fact Sheet for Patients:  BloggerCourse.com  Fact Sheet for Healthcare Providers:  SeriousBroker.it  This test is no t yet approved or cleared by the [place] FDA and  has been authorized for detection and/or diagnosis of SARS-CoV-2 by FDA under an Emergency Use Authorization (EUA). This EUA will remain  in effect (meaning this test can be used) for the duration of the COVID-19 declaration under Section 564(b)(1) of the Act,  21 U.S.C.section 360bbb-3(b)(1), unless the authorization is terminated  or revoked sooner.       Influenza A by PCR NEGATIVE NEGATIVE Final   Influenza B by PCR NEGATIVE NEGATIVE Final    Comment: (NOTE) The Xpert Xpress SARS-CoV-2/FLU/RSV plus assay is intended as an aid in the diagnosis of influenza from Nasopharyngeal swab specimens and should not be used as a sole basis for treatment. Nasal washings and aspirates are unacceptable for Xpert Xpress SARS-CoV-2/FLU/RSV testing.  Fact Sheet for Patients: BloggerCourse.com  Fact Sheet for Healthcare Providers: SeriousBroker.it  This test is not yet approved or cleared by the [place] FDA and has been authorized for detection and/or diagnosis of SARS-CoV-2 by FDA under an Emergency Use Authorization (EUA). This EUA will remain in effect (meaning this test can be used) for the duration of the COVID-19 declaration under Section 564(b)(1) of the Act, 21 U.S.C. section 360bbb-3(b)(1), unless the authorization is terminated or revoked.     Resp Syncytial Virus by PCR NEGATIVE NEGATIVE Final    Comment: (NOTE) Fact Sheet for Patients: BloggerCourse.com  Fact Sheet for Healthcare Providers: SeriousBroker.it  This test is not yet approved or cleared by the [place] FDA and has been authorized for detection and/or diagnosis of SARS-CoV-2 by FDA under an Emergency Use Authorization (EUA). This EUA will remain in effect (meaning this test can be used) for the duration of the COVID-19 declaration under Section 564(b)(1) of the Act, 21 U.S.C. section 360bbb-3(b)(1), unless the authorization is terminated or revoked.  Performed at [facility], [address] [place], [place] [address]   Blood culture (routine x 2)     Status: Abnormal   Collection Time: 05/27/23  5:05 AM   Specimen: BLOOD RIGHT ARM  Result Value  Ref Range Status   Specimen Description   Final    BLOOD RIGHT ARM Performed at [facility], [address], [place], [place] [address]    Special Requests   Final    BOTTLES DRAWN AEROBIC AND ANAEROBIC Blood Culture results may not be optimal due to an inadequate volume of blood received in culture bottles Performed at [facility], [address] [place], [place] [address]    Culture  Setup Time   Final    IN BOTH AEROBIC AND ANAEROBIC BOTTLES GRAM POSITIVE RODS CONSISTENT WITH PREVIOUS RESULT CRITICAL RESULT CALLED TO, READ BACK BY AND VERIFIED WITH: MADISON HUNT @ 1850 05/27/23 LD Performed at [facility], [address] [place], [place] [address]    Culture (A)  Final    ACTINOMYCES SPECIES LACTOBACILLUS ACIDOPHILUS Standardized susceptibility testing for this organism is not available. Performed at [facility], [address] [place], [place] [address]    Report Status 05/31/2023 FINAL  Final  Group A Strep by PCR ([facility] Only)     Status: None   Collection Time: 05/27/23  5:07 AM  Specimen: Throat; Sterile Swab  Result Value Ref Range Status   Group A Strep by PCR NOT DETECTED NOT DETECTED Final    Comment: Performed at [facility], [address] [place], [place] [address]  Blood culture (routine x 2)     Status: Abnormal   Collection Time: 05/27/23  5:07 AM   Specimen: BLOOD RIGHT ARM  Result Value Ref Range Status   Specimen Description   Final    BLOOD RIGHT ARM Performed at [facility], [address], [place], [place] [address]    Special Requests   Final    BOTTLES DRAWN AEROBIC AND ANAEROBIC Blood Culture results may not be optimal due to an inadequate volume of blood received in culture bottles Performed at [facility], [address] [place], [place] [address]    Culture  Setup Time   Final    IN BOTH AEROBIC AND ANAEROBIC BOTTLES GRAM POSITIVE RODS CRITICAL RESULT CALLED TO, READ  BACK BY AND VERIFIED WITH: MADISON HUNT @ 1850 05/27/23 LFD Performed at [facility], [address] [place], [place] [address]    Culture (A)  Final    BACILLUS SPECIES ACTINOMYCES SPECIES Standardized susceptibility testing for this organism is not available. Performed at [facility], [address] [place], [place] [address]    Report Status 05/31/2023 FINAL  Final    Labs: CBC: Recent Labs  Lab 06/03/23 1053 06/06/23 0517 06/06/23 1742 06/07/23 0546 06/07/23 1031  WBC 12.6* 18.4*  --  15.9* 14.0*  NEUTROABS 6.6  --   --   --   --   HGB 7.7* 6.5* 7.6* 7.9* 7.1*  HCT 22.2* 18.4* 21.7* 22.4* 20.5*  MCV 101.4* 98.9  --  95.7 97.6  PLT 524* 554*  --  616* 575*   Basic Metabolic Panel: Recent Labs  Lab 06/03/23 1053 06/04/23 0651 06/06/23 0517  NA 136  --  136  K 3.8  --  3.9  CL 102  --  105  CO2 23  --  23  GLUCOSE 105*  --  96  BUN 9  --  10  CREATININE 0.38* 0.41* 0.42*  CALCIUM 9.5  --  9.5   Liver Function Tests: Recent Labs  Lab 06/03/23 1053 06/06/23 0517  AST 31 27  ALT 74* 51*  ALKPHOS 216* 173*  BILITOT 2.2* 2.5*  PROT 8.2* 8.6*  ALBUMIN 4.0 4.0   CBG: No results for input(s): "GLUCAP" in the last 168 hours.  Discharge time spent: greater than 30 minutes.  SignedLonia Blood, MD [facility] 06/08/2023

## 2023-06-10 IMAGING — CR DG CHEST 2V
2 series · 2 of 2 positions shown · non-contrast
Comparison: 09/24/2017

CLINICAL DATA: Chest pain

EXAM:
CHEST - 2 VIEW

[chest pa]
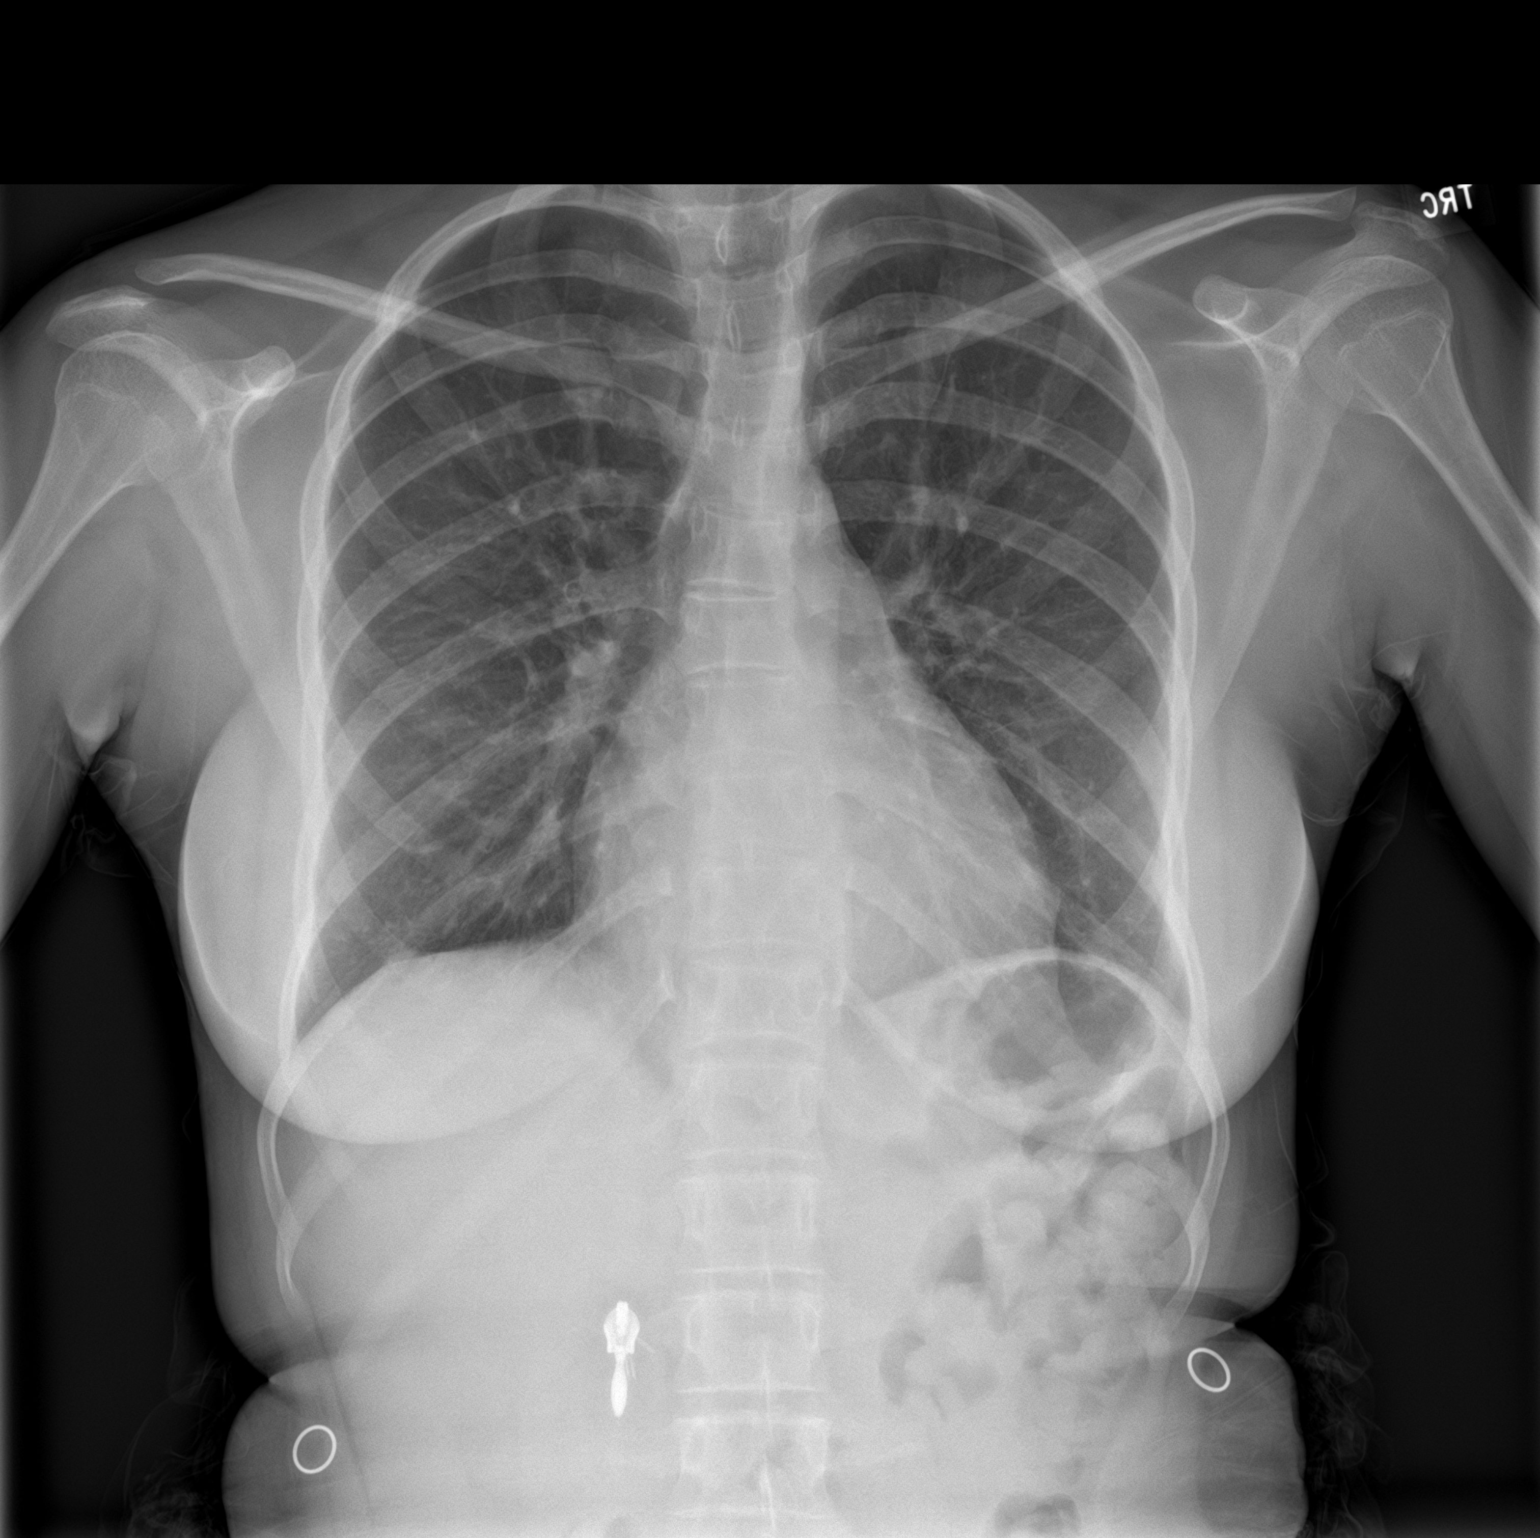

[chest lat]
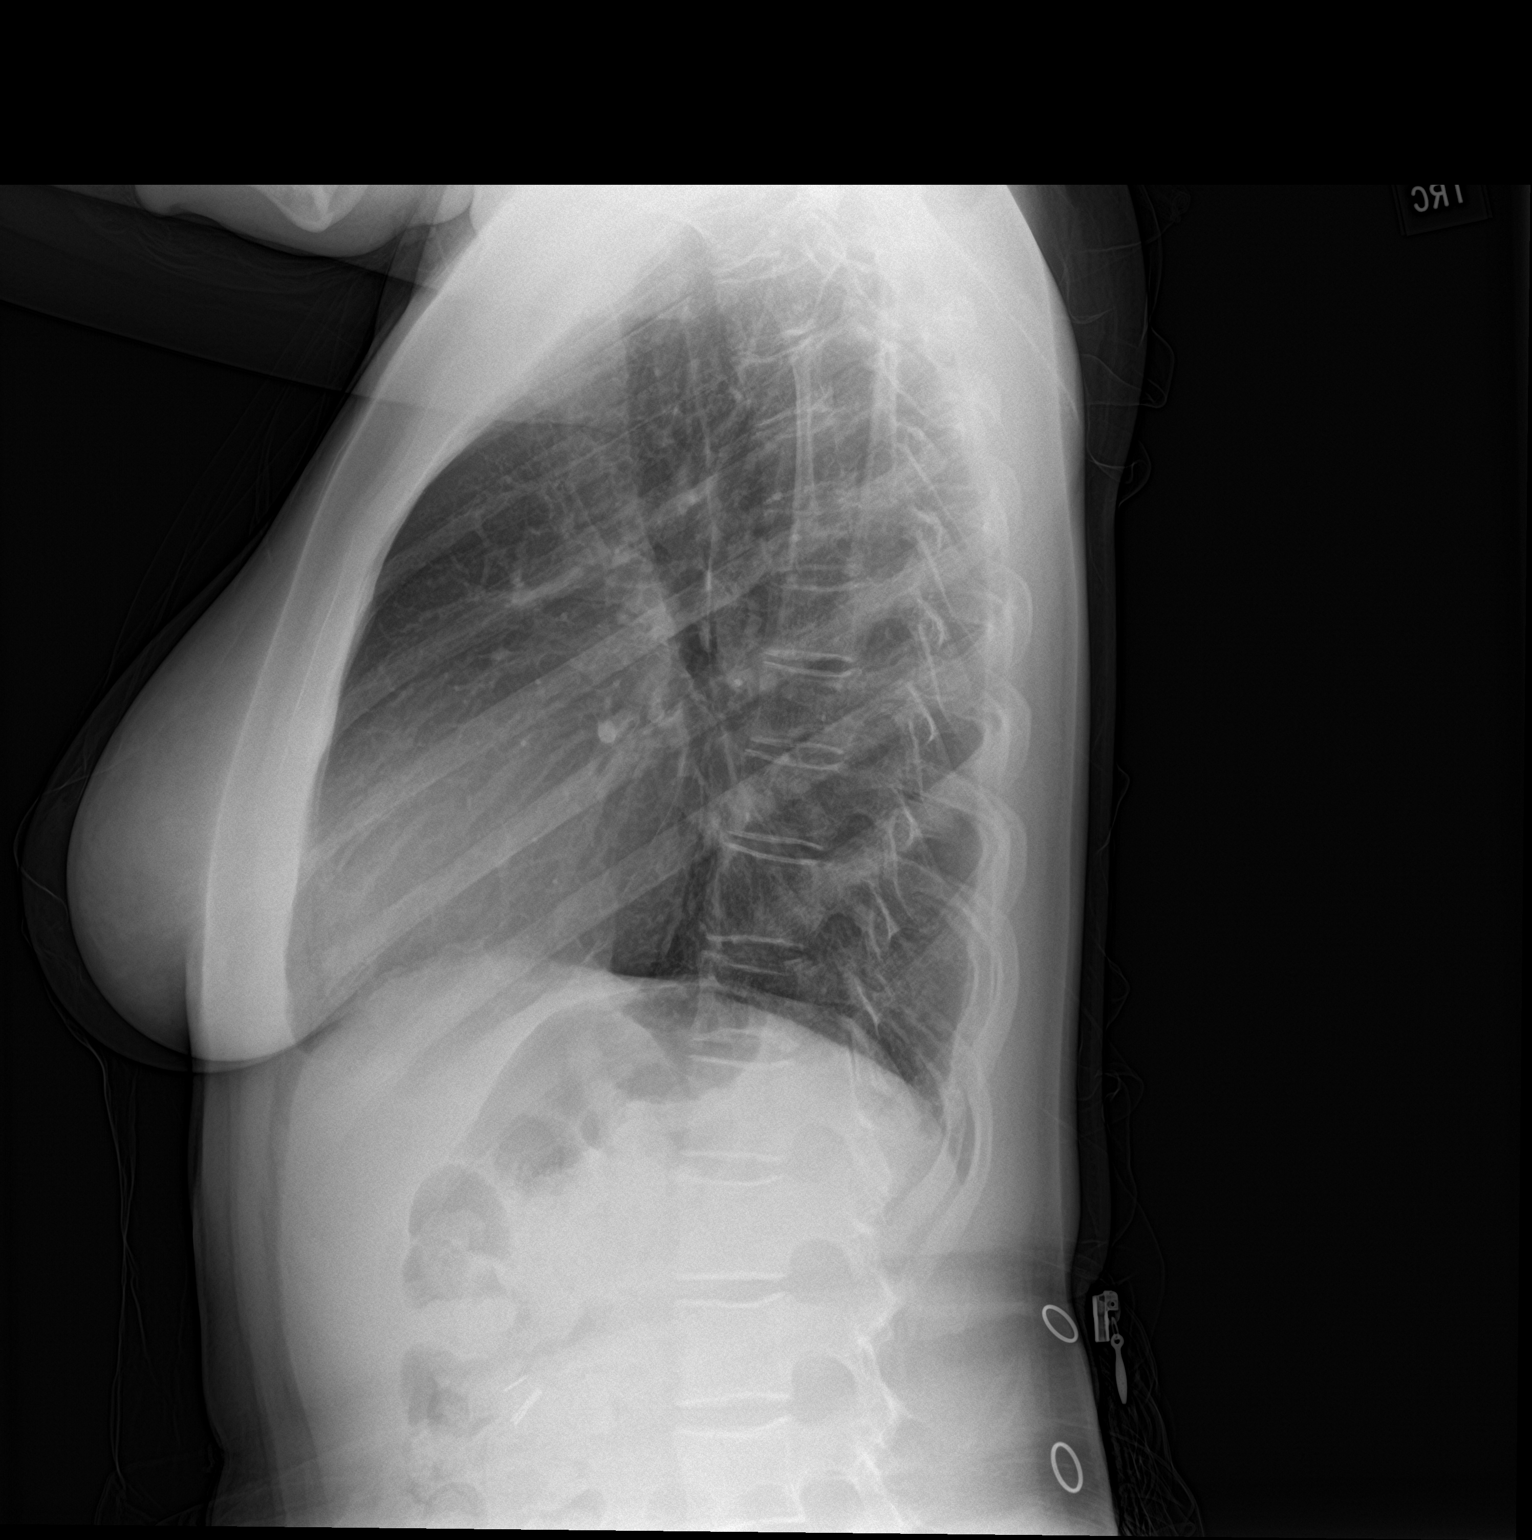

[2 of 2 positions shown; findings below may reference images not displayed]

FINDINGS: The heart size and mediastinal contours are within normal limits.
Both lungs are clear. The visualized skeletal structures are
unremarkable.
IMPRESSION: No active cardiopulmonary disease.

## 2023-07-02 ENCOUNTER — Encounter (HOSPITAL_COMMUNITY): Payer: Self-pay

## 2023-07-02 ENCOUNTER — Inpatient Hospital Stay (HOSPITAL_COMMUNITY)
Admission: EM | Admit: 2023-07-02 | Discharge: 2023-07-05 | DRG: 377 | Disposition: A | Discharge status: Home / Self Care | Payer: Medicaid Other | Source: Other Acute Inpatient Hospital | Attending: Internal Medicine | Admitting: Internal Medicine

## 2023-07-02 ENCOUNTER — Emergency Department
Admission: EM | Admit: 2023-07-02 | Discharge: 2023-07-02 | Disposition: A | Discharge status: Other Acute Inpatient Hospital | Payer: Medicaid Other | Attending: Emergency Medicine | Admitting: Emergency Medicine

## 2023-07-02 ENCOUNTER — Emergency Department: Payer: Medicaid Other

## 2023-07-02 ENCOUNTER — Other Ambulatory Visit: Payer: Self-pay

## 2023-07-02 DIAGNOSIS — K2091 Esophagitis, unspecified with bleeding: Secondary | ICD-10-CM | POA: Diagnosis present

## 2023-07-02 DIAGNOSIS — K254 Chronic or unspecified gastric ulcer with hemorrhage: Secondary | ICD-10-CM | POA: Diagnosis present

## 2023-07-02 DIAGNOSIS — G894 Chronic pain syndrome: Secondary | ICD-10-CM | POA: Diagnosis present

## 2023-07-02 DIAGNOSIS — Z91010 Allergy to peanuts: Secondary | ICD-10-CM | POA: Diagnosis not present

## 2023-07-02 DIAGNOSIS — Z833 Family history of diabetes mellitus: Secondary | ICD-10-CM | POA: Diagnosis not present

## 2023-07-02 DIAGNOSIS — Z885 Allergy status to narcotic agent status: Secondary | ICD-10-CM

## 2023-07-02 DIAGNOSIS — Z1152 Encounter for screening for COVID-19: Secondary | ICD-10-CM | POA: Diagnosis not present

## 2023-07-02 DIAGNOSIS — K922 Gastrointestinal hemorrhage, unspecified: Secondary | ICD-10-CM | POA: Diagnosis present

## 2023-07-02 DIAGNOSIS — F4310 Post-traumatic stress disorder, unspecified: Secondary | ICD-10-CM | POA: Diagnosis present

## 2023-07-02 DIAGNOSIS — Z91018 Allergy to other foods: Secondary | ICD-10-CM | POA: Diagnosis not present

## 2023-07-02 DIAGNOSIS — R112 Nausea with vomiting, unspecified: Secondary | ICD-10-CM

## 2023-07-02 DIAGNOSIS — D57219 Sickle-cell/Hb-C disease with crisis, unspecified: Secondary | ICD-10-CM | POA: Insufficient documentation

## 2023-07-02 DIAGNOSIS — I10 Essential (primary) hypertension: Secondary | ICD-10-CM | POA: Diagnosis not present

## 2023-07-02 DIAGNOSIS — K209 Esophagitis, unspecified without bleeding: Secondary | ICD-10-CM | POA: Diagnosis not present

## 2023-07-02 DIAGNOSIS — G4733 Obstructive sleep apnea (adult) (pediatric): Secondary | ICD-10-CM | POA: Diagnosis present

## 2023-07-02 DIAGNOSIS — D5700 Hb-SS disease with crisis, unspecified: Secondary | ICD-10-CM

## 2023-07-02 DIAGNOSIS — K449 Diaphragmatic hernia without obstruction or gangrene: Secondary | ICD-10-CM | POA: Diagnosis present

## 2023-07-02 DIAGNOSIS — D638 Anemia in other chronic diseases classified elsewhere: Secondary | ICD-10-CM | POA: Diagnosis present

## 2023-07-02 DIAGNOSIS — Z888 Allergy status to other drugs, medicaments and biological substances status: Secondary | ICD-10-CM

## 2023-07-02 DIAGNOSIS — Z7951 Long term (current) use of inhaled steroids: Secondary | ICD-10-CM

## 2023-07-02 DIAGNOSIS — K3189 Other diseases of stomach and duodenum: Secondary | ICD-10-CM | POA: Diagnosis present

## 2023-07-02 DIAGNOSIS — Z79891 Long term (current) use of opiate analgesic: Secondary | ICD-10-CM | POA: Diagnosis not present

## 2023-07-02 DIAGNOSIS — Z79899 Other long term (current) drug therapy: Secondary | ICD-10-CM | POA: Diagnosis not present

## 2023-07-02 DIAGNOSIS — F39 Unspecified mood [affective] disorder: Secondary | ICD-10-CM | POA: Diagnosis present

## 2023-07-02 DIAGNOSIS — F418 Other specified anxiety disorders: Secondary | ICD-10-CM | POA: Diagnosis not present

## 2023-07-02 LAB — URINALYSIS, ROUTINE W REFLEX MICROSCOPIC
Bilirubin Urine: NEGATIVE
Glucose, UA: NEGATIVE mg/dL
Hgb urine dipstick: NEGATIVE
Ketones, ur: NEGATIVE mg/dL
Leukocytes,Ua: NEGATIVE
Nitrite: NEGATIVE
Protein, ur: NEGATIVE mg/dL
Specific Gravity, Urine: 1.015 (ref 1.005–1.030)
pH: 7 (ref 5.0–8.0)

## 2023-07-02 LAB — POC URINE PREG, ED: Preg Test, Ur: NEGATIVE

## 2023-07-02 LAB — COMPREHENSIVE METABOLIC PANEL
ALT: 55 U/L — ABNORMAL HIGH (ref 0–44)
AST: 38 U/L (ref 15–41)
Albumin: 4.7 g/dL (ref 3.5–5.0)
Alkaline Phosphatase: 133 U/L — ABNORMAL HIGH (ref 38–126)
Anion gap: 11 (ref 5–15)
BUN: 10 mg/dL (ref 6–20)
CO2: 22 mmol/L (ref 22–32)
Calcium: 9.6 mg/dL (ref 8.9–10.3)
Chloride: 104 mmol/L (ref 98–111)
Creatinine, Ser: 0.5 mg/dL (ref 0.44–1.00)
GFR, Estimated: >60 mL/min (ref >60)
Glucose, Bld: 101 mg/dL — ABNORMAL HIGH (ref 70–99)
Potassium: 4 mmol/L (ref 3.5–5.1)
Sodium: 137 mmol/L (ref 135–145)
Total Bilirubin: 3.7 mg/dL — ABNORMAL HIGH (ref 0.0–1.2)
Total Protein: 8.9 g/dL — ABNORMAL HIGH (ref 6.5–8.1)

## 2023-07-02 LAB — RESP PANEL BY RT-PCR (RSV, FLU A&B, COVID)  RVPGX2
Influenza A by PCR: NEGATIVE
Influenza B by PCR: NEGATIVE
Resp Syncytial Virus by PCR: NEGATIVE
SARS Coronavirus 2 by RT PCR: NEGATIVE

## 2023-07-02 LAB — LIPASE, BLOOD: Lipase: 38 U/L (ref 11–51)

## 2023-07-02 LAB — RETICULOCYTES
Immature Retic Fract: 33 % — ABNORMAL HIGH (ref 2.3–15.9)
RBC.: 2.66 MIL/uL — ABNORMAL LOW (ref 3.87–5.11)
Retic Count, Absolute: 242.6 10*3/uL — ABNORMAL HIGH (ref 19.0–186.0)
Retic Ct Pct: 9.1 % — ABNORMAL HIGH (ref 0.4–3.1)

## 2023-07-02 LAB — CBC
HCT: 27.7 % — ABNORMAL LOW (ref 36.0–46.0)
Hemoglobin: 9.6 g/dL — ABNORMAL LOW (ref 12.0–15.0)
MCH: 35.8 pg — ABNORMAL HIGH (ref 26.0–34.0)
MCHC: 34.7 g/dL (ref 30.0–36.0)
MCV: 103.4 fL — ABNORMAL HIGH (ref 80.0–100.0)
Platelets: 407 10*3/uL — ABNORMAL HIGH (ref 150–400)
RBC: 2.68 MIL/uL — ABNORMAL LOW (ref 3.87–5.11)
RDW: 19.6 % — ABNORMAL HIGH (ref 11.5–15.5)
WBC: 9.3 10*3/uL (ref 4.0–10.5)
nRBC: 1.1 % — ABNORMAL HIGH (ref 0.0–0.2)

## 2023-07-02 LAB — BILIRUBIN, FRACTIONATED(TOT/DIR/INDIR)
Bilirubin, Direct: 0.3 mg/dL — ABNORMAL HIGH (ref 0.0–0.2)
Indirect Bilirubin: 3 mg/dL — ABNORMAL HIGH (ref 0.3–0.9)
Total Bilirubin: 3.3 mg/dL — ABNORMAL HIGH (ref 0.0–1.2)

## 2023-07-02 MED ORDER — HYDROMORPHONE HCL 1 MG/ML IJ SOLN
1.0000 mg | Freq: Once | INTRAMUSCULAR | Status: AC
  Administered 2023-07-02: 1 mg via INTRAVENOUS
  Filled 2023-07-02: qty 1

## 2023-07-02 MED ORDER — LIDOCAINE 5 % EX PTCH
2.0000 | MEDICATED_PATCH | CUTANEOUS | Status: DC
  Administered 2023-07-03: 2 via TRANSDERMAL
  Filled 2023-07-02 (×2): qty 2

## 2023-07-02 MED ORDER — MELATONIN 3 MG PO TABS: 6.0000 mg | ORAL_TABLET | Freq: Every evening | ORAL | Status: DC | PRN

## 2023-07-02 MED ORDER — OXYCODONE HCL 5 MG PO TABS
10.0000 mg | ORAL_TABLET | Freq: Four times a day (QID) | ORAL | Status: DC | PRN
  Filled 2023-07-02: qty 2

## 2023-07-02 MED ORDER — FOLIC ACID 1 MG PO TABS
1.0000 mg | ORAL_TABLET | Freq: Every day | ORAL | Status: DC
  Administered 2023-07-03 – 2023-07-04 (×2): 1 mg via ORAL
  Filled 2023-07-02 (×2): qty 1

## 2023-07-02 MED ORDER — ONDANSETRON HCL 4 MG/2ML IJ SOLN
4.0000 mg | Freq: Four times a day (QID) | INTRAMUSCULAR | Status: DC | PRN
  Administered 2023-07-03: 4 mg via INTRAVENOUS
  Filled 2023-07-02: qty 2

## 2023-07-02 MED ORDER — PANTOPRAZOLE SODIUM 40 MG IV SOLR
40.0000 mg | Freq: Once | INTRAVENOUS | Status: AC
  Administered 2023-07-02: 40 mg via INTRAVENOUS
  Filled 2023-07-02: qty 10

## 2023-07-02 MED ORDER — ONDANSETRON HCL 4 MG/2ML IJ SOLN
4.0000 mg | Freq: Once | INTRAMUSCULAR | Status: AC
  Administered 2023-07-02: 4 mg via INTRAVENOUS
  Filled 2023-07-02: qty 2

## 2023-07-02 MED ORDER — OXYCODONE HCL 5 MG PO TABS
10.0000 mg | ORAL_TABLET | Freq: Four times a day (QID) | ORAL | Status: DC
  Administered 2023-07-03 – 2023-07-05 (×10): 10 mg via ORAL
  Filled 2023-07-02 (×10): qty 2

## 2023-07-02 MED ORDER — ALBUTEROL SULFATE (2.5 MG/3ML) 0.083% IN NEBU: 2.5000 mg | INHALATION_SOLUTION | RESPIRATORY_TRACT | Status: DC | PRN

## 2023-07-02 MED ORDER — POLYETHYLENE GLYCOL 3350 17 G PO PACK
17.0000 g | PACK | Freq: Every day | ORAL | Status: DC | PRN
Start: 2023-07-02 — End: 2023-07-05

## 2023-07-02 MED ORDER — OXYCODONE HCL 5 MG PO TABS: 5.0000 mg | ORAL_TABLET | Freq: Four times a day (QID) | ORAL | Status: DC | PRN

## 2023-07-02 MED ORDER — HYDROMORPHONE HCL 1 MG/ML IJ SOLN
1.0000 mg | INTRAMUSCULAR | Status: DC | PRN
  Administered 2023-07-02: 1 mg via INTRAVENOUS
  Filled 2023-07-02: qty 1

## 2023-07-02 MED ORDER — SODIUM CHLORIDE 0.9 % IV SOLN: Freq: Once | INTRAVENOUS | Status: AC

## 2023-07-02 MED ORDER — HYDROMORPHONE HCL 1 MG/ML IJ SOLN
1.0000 mg | INTRAMUSCULAR | Status: AC | PRN
  Administered 2023-07-02 (×2): 1 mg via INTRAVENOUS
  Filled 2023-07-02 (×2): qty 1

## 2023-07-02 MED ORDER — HYDROMORPHONE HCL 1 MG/ML IJ SOLN
1.0000 mg | INTRAMUSCULAR | Status: DC | PRN
  Administered 2023-07-03 – 2023-07-05 (×11): 1 mg via INTRAVENOUS
  Filled 2023-07-02 (×12): qty 1

## 2023-07-02 MED ORDER — PANTOPRAZOLE SODIUM 40 MG IV SOLR
40.0000 mg | Freq: Two times a day (BID) | INTRAVENOUS | Status: DC
  Administered 2023-07-03 – 2023-07-04 (×4): 40 mg via INTRAVENOUS
  Filled 2023-07-02 (×4): qty 10

## 2023-07-02 MED ORDER — SODIUM CHLORIDE 0.9 % IV BOLUS
1000.0000 mL | Freq: Once | INTRAVENOUS | Status: AC
  Administered 2023-07-02: 1000 mL via INTRAVENOUS

## 2023-07-02 NOTE — ED Triage Notes (Signed)
 Pt comes via [facility] from home with c/o belly pain. Pt was seen at [facility] for vomiting. Pt also c/o sickle cell crisis. Pt has been out of pain meds since yesterday. Pt not due for refill til Thursday.  VSS  Pt did throw up some blood in her vomit.

## 2023-07-02 NOTE — ED Notes (Signed)
 Patient in bed 46 has been accepted at [facility] awaiting on bed assignment

## 2023-07-02 NOTE — ED Provider Notes (Addendum)
 [facility] Provider Note    Event Date/Time   First MD Initiated Contact with Patient 07/02/23 1153     (approximate)   History   Abdominal Pain   HPI  Bailey Reese is a 18 y.o. female with history of sickle cell disease, PTSD, depression, and anxiety who presents with upper abdominal pain for the last 2 days, persistent course, associated with multiple episodes of nausea and vomiting.  The patient was at the [facility] ED earlier this morning and states that after she was discharged around 5 AM she started to have recurrent vomiting and persistent pain.  She is unable to take her pain medications due to the vomiting.  She also reports several episodes of bright red blood in her vomitus, 1 time since she left the [facility].  She describes it as a moderate amount, more than just streaks in the vomitus.  She reports dark stools.  She has no fever or chills.  She denies urinary symptoms.  I reviewed the past medical records.  The patient was admitted to the hospital service at [facility] from 1/12 through 1/23 due to sickle cell pain crisis.  Subsequently she was seen at [facility] in the ED yesterday with nausea, vomiting, abdominal pain, and hematemesis.  She had a CT abdomen/pelvis at 3:24 AM today which was negative for any acute findings:  IMPRESSION:  --No acute intra-abdominal/pelvic abnormality.  --Diffuse symmetric hypoattenuation in the renal papilla bilaterally, which may represent papillary necrosis in this patient with sickle cell disease. Pyelonephritis is felt less likely.  --Similar left femoral head avascular necrosis.     Physical Exam   Triage Vital Signs: ED Triage Vitals  Encounter Vitals Group     BP 07/02/23 1110 100/88     Systolic BP Percentile --      Diastolic BP Percentile --      Pulse Rate 07/02/23 1110 (!) 106     Resp 07/02/23 1110 16     Temp 07/02/23 1110 99.3 F (37.4 C)     Temp Source 07/02/23 1110 Oral     SpO2 07/02/23 1110 100  %     Weight 07/02/23 1032 136 lb (61.7 kg)     Height 07/02/23 1032 5\' 3"  (1.6 m)     Head Circumference --      Peak Flow --      Pain Score 07/02/23 1032 8     Pain Loc --      Pain Education --      Exclude from Growth Chart --     Most recent vital signs: Vitals:   07/02/23 1330 07/02/23 1400  BP: 101/64 (!) 130/103  Pulse: 94 92  Resp:  20  Temp:    SpO2:  100%     General: Alert, uncomfortable appearing, no distress.  CV:  Good peripheral perfusion.  Resp:  Normal effort.  Abd:  No distention.  Soft with no focal tenderness. Other:  No jaundice or scleral icterus.  Dry mucous membranes.   ED Results / Procedures / Treatments   Labs (all labs ordered are listed, but only abnormal results are displayed) Labs Reviewed  COMPREHENSIVE METABOLIC PANEL - Abnormal; Notable for the following components:      Result Value   Glucose, Bld 101 (*)    Total Protein 8.9 (*)    ALT 55 (*)    Alkaline Phosphatase 133 (*)    Total Bilirubin 3.7 (*)    All other components  within normal limits  CBC - Abnormal; Notable for the following components:   RBC 2.68 (*)    Hemoglobin 9.6 (*)    HCT 27.7 (*)    MCV 103.4 (*)    MCH 35.8 (*)    RDW 19.6 (*)    Platelets 407 (*)    nRBC 1.1 (*)    All other components within normal limits  RETICULOCYTES - Abnormal; Notable for the following components:   Retic Ct Pct 9.1 (*)    RBC. 2.66 (*)    Retic Count, Absolute 242.6 (*)    Immature Retic Fract 33.0 (*)    All other components within normal limits  RESP PANEL BY RT-PCR (RSV, FLU A&B, COVID)  RVPGX2  LIPASE, BLOOD  URINALYSIS, ROUTINE W REFLEX MICROSCOPIC  BILIRUBIN, FRACTIONATED(TOT/DIR/INDIR)  POC URINE PREG, ED     EKG    RADIOLOGY  US abdomen RUQ: Pending  PROCEDURES:  Critical Care performed: No  Procedures   MEDICATIONS ORDERED IN ED: Medications  sodium chloride 0.9 % bolus 1,000 mL (0 mLs Intravenous Stopped 07/02/23 1507)  ondansetron (ZOFRAN)  injection 4 mg (4 mg Intravenous Given 07/02/23 1259)  HYDROmorphone (DILAUDID) injection 1 mg (1 mg Intravenous Given 07/02/23 1301)  pantoprazole (PROTONIX) injection 40 mg (40 mg Intravenous Given 07/02/23 1259)  HYDROmorphone (DILAUDID) injection 1 mg (1 mg Intravenous Given 07/02/23 1358)  HYDROmorphone (DILAUDID) injection 1 mg (1 mg Intravenous Given 07/02/23 1508)  0.9 %  sodium chloride infusion ( Intravenous New Bag/Given 07/02/23 1507)     IMPRESSION / MDM / ASSESSMENT AND PLAN / ED COURSE  I reviewed the triage vital signs and the nursing notes.  18 year old female with PMH as noted above presents with persistent nausea and vomiting as well as upper abdominal pain and more generalized sickle cell pain; she is unable to take her normal pain medication due to the vomiting.  She was just discharged from the [facility] ED earlier this morning.  On exam the patient is uncomfortable but overall nontoxic appearing.  Mucous membranes are dry.  She has no focal abdominal tenderness.  Differential diagnosis includes, but is not limited to, viral gastroenteritis, flu or other viral syndrome, foodborne illness, gastritis, gastroparesis, PUD.  Differential for the bleeding also includes Mallory-Weiss tear.  I have a low suspicion for esophageal varices.  Since the patient had a  negative CT approximately 9 hours ago, there is no indication for repeat at this time.  We will obtain labs, give fluids, IV Dilaudid, Zofran, Protonix, and reassess.  Patient's presentation is most consistent with acute presentation with potential threat to life or bodily function.  ----------------------------------------- 3:13 PM on 07/02/2023 -----------------------------------------  Lab workup is overall unremarkable.  Reticulocytes are slightly lower than her recent baseline.  Hemoglobin is stable.  CMP shows no acute findings except for bilirubin which is chronically elevated but higher today.  I have added on a fractionated  bilirubin to determine if this is direct or indirect.  I considered an ultrasound, however the CT from earlier this morning shows no biliary ductal dilatation and the patient is status post cholecystectomy.  The patient reports some improvement in her pain but still has relatively severe sickle cell pain as well as pain in her abdomen.  She has received several doses of IV Dilaudid, as well as Zofran and fluids.  She will need admission for further management.  I contacted the transfer center at [facility], however [facility] is currently at capacity for medical patients.  I  then contacted [facility] and discussed the case with the hospitalist provider at [facility].  The patient has been accepted for transfer to [facility].  She is stable for transfer at this time.  She will be signed out to the oncoming ED physician Dr. Rosalia Hammers.   FINAL CLINICAL IMPRESSION(S) / ED DIAGNOSES   Final diagnoses:  Sickle cell pain crisis (HCC)  Intractable nausea and vomiting     Rx / DC Orders   ED Discharge Orders     None        Note:  This document was prepared using Dragon voice recognition software and may include unintentional dictation errors.    Dionne Bucy, MD 07/02/23 1516    Dionne Bucy, MD 07/02/23 [phone]

## 2023-07-03 DIAGNOSIS — K922 Gastrointestinal hemorrhage, unspecified: Secondary | ICD-10-CM | POA: Diagnosis present

## 2023-07-03 DIAGNOSIS — D5700 Hb-SS disease with crisis, unspecified: Secondary | ICD-10-CM | POA: Diagnosis not present

## 2023-07-03 LAB — HEPATIC FUNCTION PANEL
ALT: 41 U/L (ref 0–44)
AST: 23 U/L (ref 15–41)
Albumin: 3.8 g/dL (ref 3.5–5.0)
Alkaline Phosphatase: 125 U/L (ref 38–126)
Bilirubin, Direct: 0.3 mg/dL — ABNORMAL HIGH (ref 0.0–0.2)
Indirect Bilirubin: 2.7 mg/dL — ABNORMAL HIGH (ref 0.3–0.9)
Total Bilirubin: 3 mg/dL — ABNORMAL HIGH (ref 0.0–1.2)
Total Protein: 7.9 g/dL (ref 6.5–8.1)

## 2023-07-03 LAB — CBC
HCT: 25.9 % — ABNORMAL LOW (ref 36.0–46.0)
HCT: 24.7 % — ABNORMAL LOW (ref 36.0–46.0)
Hemoglobin: 8.8 g/dL — ABNORMAL LOW (ref 12.0–15.0)
Hemoglobin: 8.6 g/dL — ABNORMAL LOW (ref 12.0–15.0)
MCH: 36.2 pg — ABNORMAL HIGH (ref 26.0–34.0)
MCH: 36.1 pg — ABNORMAL HIGH (ref 26.0–34.0)
MCHC: 34 g/dL (ref 30.0–36.0)
MCHC: 34.8 g/dL (ref 30.0–36.0)
MCV: 106.6 fL — ABNORMAL HIGH (ref 80.0–100.0)
MCV: 103.8 fL — ABNORMAL HIGH (ref 80.0–100.0)
Platelets: 354 10*3/uL (ref 150–400)
Platelets: 342 10*3/uL (ref 150–400)
RBC: 2.43 MIL/uL — ABNORMAL LOW (ref 3.87–5.11)
RBC: 2.38 MIL/uL — ABNORMAL LOW (ref 3.87–5.11)
RDW: 19.1 % — ABNORMAL HIGH (ref 11.5–15.5)
RDW: 18.7 % — ABNORMAL HIGH (ref 11.5–15.5)
WBC: 9.3 10*3/uL (ref 4.0–10.5)
WBC: 8.1 10*3/uL (ref 4.0–10.5)
nRBC: 0.3 % — ABNORMAL HIGH (ref 0.0–0.2)
nRBC: 0.6 % — ABNORMAL HIGH (ref 0.0–0.2)

## 2023-07-03 LAB — BASIC METABOLIC PANEL
Anion gap: 10 (ref 5–15)
BUN: 8 mg/dL (ref 6–20)
CO2: 17 mmol/L — ABNORMAL LOW (ref 22–32)
Calcium: 8.9 mg/dL (ref 8.9–10.3)
Chloride: 108 mmol/L (ref 98–111)
Creatinine, Ser: 0.4 mg/dL — ABNORMAL LOW (ref 0.44–1.00)
GFR, Estimated: >60 mL/min (ref >60)
Glucose, Bld: 92 mg/dL (ref 70–99)
Potassium: 3.6 mmol/L (ref 3.5–5.1)
Sodium: 135 mmol/L (ref 135–145)

## 2023-07-03 LAB — PROTIME-INR
INR: 1.1 (ref 0.8–1.2)
Prothrombin Time: 14.7 s (ref 11.4–15.2)

## 2023-07-03 LAB — TYPE AND SCREEN
ABO/RH(D): B POS
Antibody Screen: NEGATIVE

## 2023-07-03 LAB — PHOSPHORUS: Phosphorus: 4.3 mg/dL (ref 2.5–4.6)

## 2023-07-03 LAB — MAGNESIUM: Magnesium: 2 mg/dL (ref 1.7–2.4)

## 2023-07-03 MED ORDER — PRAZOSIN HCL 1 MG PO CAPS
1.0000 mg | ORAL_CAPSULE | Freq: Every day | ORAL | Status: DC
  Administered 2023-07-03 – 2023-07-04 (×2): 1 mg via ORAL
  Filled 2023-07-03 (×2): qty 1

## 2023-07-03 MED ORDER — SODIUM CHLORIDE 0.9 % IV SOLN: INTRAVENOUS | Status: AC

## 2023-07-03 MED ORDER — METHOCARBAMOL 500 MG PO TABS: 500.0000 mg | ORAL_TABLET | Freq: Three times a day (TID) | ORAL | Status: DC | PRN

## 2023-07-03 MED ORDER — OXYCODONE HCL ER 10 MG PO T12A
10.0000 mg | EXTENDED_RELEASE_TABLET | Freq: Two times a day (BID) | ORAL | Status: DC
  Administered 2023-07-03 – 2023-07-04 (×3): 10 mg via ORAL
  Filled 2023-07-03 (×3): qty 1

## 2023-07-03 MED ORDER — HYDROXYUREA 500 MG PO CAPS
1500.0000 mg | ORAL_CAPSULE | Freq: Every day | ORAL | Status: DC
Start: 2023-07-03 — End: 2023-07-05
  Administered 2023-07-03 – 2023-07-04 (×2): 1500 mg via ORAL
  Filled 2023-07-03 (×2): qty 3

## 2023-07-03 MED ORDER — PREGABALIN 75 MG PO CAPS
75.0000 mg | ORAL_CAPSULE | Freq: Three times a day (TID) | ORAL | Status: DC
  Administered 2023-07-03 – 2023-07-04 (×6): 75 mg via ORAL
  Filled 2023-07-03 (×6): qty 1

## 2023-07-03 MED ORDER — HYDROXYZINE HCL 25 MG PO TABS: 25.0000 mg | ORAL_TABLET | Freq: Two times a day (BID) | ORAL | Status: DC | PRN

## 2023-07-03 NOTE — H&P (Addendum)
 History and Physical    [name] QMV:[id number] DOB: 2006/02/16 DOA: 07/02/2023  PCP: Julious Oka, MD   Patient coming from:  Transfer from [facility]   Chief Complaint: Diffuse pain, hematemesis    HPI:  Bailey Reese is a 18 y.o. 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   Reports that on Thurs 2/13 developed epigastric pain and followed by hematemesis with bright red blood.  She has had intermittent hematemesis since this time.  Also having dark maroon stool.  Reports prior history of endoscopy and appears she had one in 10/' 24 at [facility] which is available in Care Everywhere and was normal, biopsy for H. Pylori neg for h pylori but did show gastritis / intestinal metaplasia, result included below.  Not taking any NSAIDs or other anticoagulants.  Due to her nausea and vomiting had been unable to tolerate oral pain medications which she thinks set off her sickle cell pain.  Also since she had been vomiting undigested pain pills has been taking additional pain pills and is now out of her prescription; last fill was oxycodone 10 mg #35, for 7-day course on 2/12  She reports having diffuse pain including chest wall, lower back, and extremities mainly consistent with her prior sickle cell pain.  Pain is tolerable with current pain regimen  Review of Systems:  ROS complete and negative except as marked above   Allergies  Allergen Reactions   Other Swelling    Pitted fruits   Prunus Persica Swelling    Lips swell    Cherry Itching    Lips itch   Cherry Itching, Swelling and Other (See Comments)    Mouth and throat get itchy & lips swell; throat tightens   Morphine And Codeine Hives and Itching   Other Itching, Swelling and Other (See Comments)    PITTED FRUITS = Mouth and throat get itchy & lips swell; throat tightens    Peach [Prunus Persica]  Itching, Swelling and Other (See Comments)    Mouth and throat get itchy & lips swell; throat tightens   Peanut Butter Flavoring Agent (Non-Screening) Itching    Per pt "mouth gets itchy"   Peanut-Containing Drug Products Itching, Swelling and Other (See Comments)    Mouth and throat get itchy & lips swell; throat tightens; chest tightens   Plum Pulp Itching, Swelling and Other (See Comments)    Mouth and throat get itchy & lips swell; throat tightens    Prior to Admission medications   Medication Sig Start Date End Date Taking? Authorizing Provider  budesonide-formoterol (SYMBICORT) 80-4.5 MCG/ACT inhaler Inhale 2 puffs into the lungs 2 (two) times daily as needed (wheezing, shortness of breath). 12/29/22 12/29/23  [provider]  fexofenadine (ALLEGRA) 180 MG tablet Take 180 mg by mouth daily as needed for allergies. 12/29/22   [provider]  fluticasone (FLONASE) 50 MCG/ACT nasal spray Place 1 spray into both nostrils daily as needed. 12/29/22   [provider]  OLANZapine zydis (ZYPREXA) 5 MG disintegrating tablet Take 5 mg by mouth at bedtime. Patient not taking: Reported on 05/28/2023 04/29/23   [provider]  oxyCODONE (OXYCONTIN) 10 mg 12 hr tablet Take 1 tablet (10 mg total) by mouth every 12 (twelve) hours. 06/08/23   Rometta Emery, MD  Oxycodone HCl 10 MG TABS Take 10 mg by mouth every 6 (six)  hours as needed (pain).    [provider]  pregabalin (LYRICA) 75 MG capsule Take 75 mg by mouth 3 (three) times daily.    [provider]  promethazine (PHENERGAN) 12.5 MG tablet Take 12.5 mg by mouth every 6 (six) hours as needed. Patient not taking: Reported on 05/28/2023 05/10/23   [provider]    Past Medical History:  Diagnosis Date   Anxiety    Depression    PTSD (post-traumatic stress disorder)    Sickle cell anemia (HCC)     Past Surgical History:  Procedure Laterality Date   CHOLECYSTECTOMY     WRIST  SURGERY       reports that she has never smoked. She has never used smokeless tobacco. She reports that she does not drink alcohol. No history on file for drug use.  Family History  Problem Relation Age of Onset   Diabetes Paternal Aunt      Physical Exam: Vitals:   07/03/23 0223  BP: 112/79  Pulse: 83  Resp: 18  Temp: 98.6 F (37 C)  TempSrc: Oral  SpO2: 100%    Gen: Awake, alert, NAD, well-appearing CV: Regular, normal S1, S2, no murmurs  Resp: Normal WOB, CTAB  Abd: Flat, normoactive, nontender MSK: Symmetric, no edema  Skin: No rashes or lesions to exposed skin  Neuro: Alert and interactive  Psych: euthymic, appropriate    Data review:   Labs reviewed, notable for:   T. bili 3.7, unconjugated 3 ALT 55, alk phos 133 Hemoglobin 9.6, up from prior, likely hemoconcentrated, macrocytic. Reticulocyte percent 9.1 hCG negative  Micro:  Results for orders placed or performed during the hospital encounter of 07/02/23  Resp panel by RT-PCR (RSV, Flu A&B, Covid) Anterior Nasal Swab     Status: None   Collection Time: 07/02/23 12:38 PM   Specimen: Anterior Nasal Swab  Result Value Ref Range Status   SARS Coronavirus 2 by RT PCR NEGATIVE NEGATIVE Final    Comment: (NOTE) SARS-CoV-2 target nucleic acids are NOT DETECTED.  The SARS-CoV-2 RNA is generally detectable in upper respiratory specimens during the acute phase of infection. The lowest concentration of SARS-CoV-2 viral copies this assay can detect is 138 copies/mL. A negative result does not preclude SARS-Cov-2 infection and should not be used as the sole basis for treatment or other patient management decisions. A negative result may occur with  improper specimen collection/handling, submission of specimen other than nasopharyngeal swab, presence of viral mutation(s) within the areas targeted by this assay, and inadequate number of viral copies(<138 copies/mL). A negative result must be combined  with clinical observations, patient history, and epidemiological information. The expected result is Negative.  Fact Sheet for Patients:  BloggerCourse.com  Fact Sheet for Healthcare Providers:  SeriousBroker.it  This test is no t yet approved or cleared by the [place] FDA and  has been authorized for detection and/or diagnosis of SARS-CoV-2 by FDA under an Emergency Use Authorization (EUA). This EUA will remain  in effect (meaning this test can be used) for the duration of the COVID-19 declaration under Section 564(b)(1) of the Act, 21 U.S.C.section 360bbb-3(b)(1), unless the authorization is terminated  or revoked sooner.       Influenza A by PCR NEGATIVE NEGATIVE Final   Influenza B by PCR NEGATIVE NEGATIVE Final    Comment: (NOTE) The Xpert Xpress SARS-CoV-2/FLU/RSV plus assay is intended as an aid in the diagnosis of influenza from Nasopharyngeal swab specimens and should not be used as a  sole basis for treatment. Nasal washings and aspirates are unacceptable for Xpert Xpress SARS-CoV-2/FLU/RSV testing.  Fact Sheet for Patients: BloggerCourse.com  Fact Sheet for Healthcare Providers: SeriousBroker.it  This test is not yet approved or cleared by the [place] FDA and has been authorized for detection and/or diagnosis of SARS-CoV-2 by FDA under an Emergency Use Authorization (EUA). This EUA will remain in effect (meaning this test can be used) for the duration of the COVID-19 declaration under Section 564(b)(1) of the Act, 21 U.S.C. section 360bbb-3(b)(1), unless the authorization is terminated or revoked.     Resp Syncytial Virus by PCR NEGATIVE NEGATIVE Final    Comment: (NOTE) Fact Sheet for Patients: BloggerCourse.com  Fact Sheet for Healthcare Providers: SeriousBroker.it  This test is not yet approved  or cleared by the [place] FDA and has been authorized for detection and/or diagnosis of SARS-CoV-2 by FDA under an Emergency Use Authorization (EUA). This EUA will remain in effect (meaning this test can be used) for the duration of the COVID-19 declaration under Section 564(b)(1) of the Act, 21 U.S.C. section 360bbb-3(b)(1), unless the authorization is terminated or revoked.  Performed at [facility], [address] [place], [place] [address]     Imaging reviewed:  [facility] CT + CXR:  EXAM: CT ABDOMEN PELVIS W CONTRAST  ACCESSION: [id number]  REPORT DATE: 07/02/2023 4:26 AM  CLINICAL INDICATION: 18 years old with worsening abdominal pain, hematemesis and melena    COMPARISON: 04/19/2023 CT abdomen and pelvis   TECHNIQUE: A helical CT scan of the abdomen and pelvis was obtained following IV contrast from the lung bases through the pubic symphysis. Images were reconstructed in the axial plane. Coronal and sagittal reformatted images were also provided for further evaluation.    FINDINGS:   LOWER CHEST: Limited evaluation secondary to respiratory motion artifact. No pleural effusion.   LIVER: Normal liver contour. Focal fatty infiltration along the falciform ligament. No focal liver lesions.   BILIARY: The gallbladder is surgically absent. No biliary ductal dilatation.    SPLEEN: Normal in size and contour. Unchanged areas of hypoattenuation with somewhat increased peripheral vascularity in the upper (2:10), and inferior spleen (2:19), which may represent hemangiomas/lymphangiomas.   PANCREAS: Normal pancreatic contour.  No focal lesions.  No ductal dilation.   ADRENAL GLANDS: Normal appearance of the adrenal glands.   KIDNEYS/URETERS: Symmetric diffuse bilateral scattered hypoattenuation in the renal papilla. No hydronephrosis.  No solid renal mass.   BLADDER: Distended and otherwise unremarkable.   REPRODUCTIVE ORGANS: Uterus is present. No suspicious adnexal  masses.   GI TRACT: The stomach is partially distended with internal debris. The duodenum is normal in course and caliber. No findings of bowel obstruction or acute inflammation. Normal appendix.   PERITONEUM, RETROPERITONEUM AND MESENTERY: No free air.  No ascites.  No fluid collection.   LYMPH NODES: No adenopathy.   VESSELS: Hepatic and portal veins are patent.  Normal caliber aorta.    BONES and SOFT TISSUES: Rectus diastases with small fat-containing umbilical hernia. Similar left femoral head avascular necrosis. H-shaped T11 and L4 vertebral bodies, similar to prior and can be seen in setting of sickle cell disease. T10 vertebral body hemangioma.    IMPRESSION:  --No acute intra-abdominal/pelvic abnormality.  --Diffuse symmetric hypoattenuation in the renal papilla bilaterally, which may represent papillary necrosis in this patient with sickle cell disease. Pyelonephritis is felt less likely.  --Similar left femoral head avascular necrosis.   ===================  EXAM: XR CHEST 2 VIEWS  ACCESSION: [id number] Bethann Humble  REPORT DATE: 07/02/2023 1:27 AM   CLINICAL INDICATION: CHEST PAIN    TECHNIQUE: PA and Lateral Chest Radiographs.   COMPARISON: 06/25/2023 chest radiograph   FINDINGS:   Lungs are clear.  No pleural effusion or pneumothorax.   Normal heart size and mediastinal contours.   IMPRESSION:   Normal chest.   ==============================  Historical data:  EGD 10/'24:  Impression:            - Normal esophagus.                         - Normal stomach. Biopsied.                         - Normal examined duodenum.    Biopsy EGD 10/'24:  Diagnosis   A: Stomach, biopsy - Antral mucosa with focal enhanced gastritis, focal complete intestinal metaplasia, and mild superficial chronic gastritic (see comment) - Oxyntic mucosa with mild superficial chronic gastritis - No Helicobacter organisms identified by H&E or immunostain - No granulomas, viral cytopathic  effect, or dysplasia identified      ED Course:  Per ED notes treated with IV fluids unspecified amount, Dilaudid, Zofran, PPI.  Transferred from [facility] for sickle cell VOC and UGIB   Assessment/Plan:  18 y.o. 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   Sickle cell VOC  - Schedule oxycodone 10 mg every 6 hour, add additional 5/10 mg p.o. for moderate/severe pain. - Dilaudid 1 mg IV every 4 hour as needed for breakthrough - Adjuncts Tylenol as needed, methocarbamol prn, avoid NSAIDs with history of upper GI bleeding, heat, lidocaine. - Continue her home folate, hydroxyurea - Incentive spirometry - Currently out of oxycodone Rx will need refilled prior to discharge - Sickle cell team to assume care in the morning  Upper GI bleed  -GI consult, messaged Dr. Marca Ancona of [facility]; their group will consult in the morning.  -Continue pantoprazole 40 mg IV every 12 hours -Serial CBC, type and screen, check coags -N.p.o. in case of endoscopy, gentle IV fluid with normal saline 75 cc/h while n.p.o.  ? Renal papillary necrosis  Noted on CT at [facility] included and results above.  No hematuria  Chronic medical problems: Medication management: Medication management: Pharm med history not completed at time of admission, med rec completed based on fill hx / discussion with patient. Attention to pharmacy med rec when completed  History left femoral AVN Mood disorder, PTSD: Continue home prazosin, hydroxyzine as needed Chronic pain: Continue home Lyrica 3 times daily  There is no height or weight on file to calculate BMI.    DVT prophylaxis:  SCDs Code Status:  Full Code Diet:  Diet Orders (From admission, onward)     Start     Ordered   07/03/23 0001  Diet NPO time specified Except for: Sips with Meds, Ice Chips  Diet effective midnight       Question Answer Comment  Except for  Sips with Meds   Except for Ice Chips      07/02/23 2343           Family Communication: No Consults:  none   Admission status:   Inpatient, Med-Surg  Severity of Illness: The appropriate patient status for this patient is INPATIENT. Inpatient status is judged to be reasonable and  necessary in order to provide the required intensity of service to ensure the patient's safety. The patient's presenting symptoms, physical exam findings, and initial radiographic and laboratory data in the context of their chronic comorbidities is felt to place them at high risk for further clinical deterioration. Furthermore, it is not anticipated that the patient will be medically stable for discharge from the hospital within 2 midnights of admission.   * I certify that at the point of admission it is my clinical judgment that the patient will require inpatient hospital care spanning beyond 2 midnights from the point of admission due to high intensity of service, high risk for further deterioration and high frequency of surveillance required.*   Dolly Rias, MD [facility]  How to contact the [facility] Attending or Consulting provider 7A - 7P or covering provider during after hours 7P -7A, for this patient.  Check the care team in [facility] and look for a) attending/consulting [facility] provider listed and b) the [facility] team listed Log into [email or web address] and use [facility] universal password to access. If you do not have the password, please contact the hospital operator. Locate the [facility] provider you are looking for under [facility] and page to a number that you can be directly reached. If you still have difficulty reaching the provider, please page the [facility] (Director on Call) for the Hospitalists listed on amion for assistance.  07/03/2023, 5:44 AM

## 2023-07-03 NOTE — Consult Note (Signed)
 [facility] Consult  Referring Provider: Dionne Bucy, MD Primary Care Physician:  Julious Oka, MD Primary Gastroenterologist: Hollace Hayward, [facility]    Reason for Consultation:  Intermittent hematemesis and dark maroon stools  HPI: Bailey Reese is a 18 y.o. female states she was in her usual state of health until Thursday, 5 days ago when she developed nausea and vomiting.  She noticed that the vomitus contained dark blood, she had about 2 of those episodes each day and this was associated with epigastric, left upper and right upper quadrant abdominal pain.  She has also noticed that the stools have been unusually dark.  She denies noticing fresh blood in stool. She denies use of aspirin, Goody powders and NSAIDs. She takes Lyrica, oxycodone for pain control. At home she takes an acid suppressing medicine, thinks it is esomeprazole, denies issues with acid reflux, heartburn, difficulty swallowing, pain on swallowing, early satiety, unintentional weight loss loss of appetite.   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   Past Medical History:  Diagnosis Date   Anxiety    Depression    PTSD (post-traumatic stress disorder)    Sickle cell anemia (HCC)     Past Surgical History:  Procedure Laterality Date   CHOLECYSTECTOMY     WRIST SURGERY      Prior to Admission medications   Medication Sig Start Date End Date Taking? Authorizing Provider  budesonide-formoterol (SYMBICORT) 80-4.5 MCG/ACT inhaler Inhale 2 puffs into the lungs 2 (two) times daily as needed (wheezing, shortness of breath). 12/29/22 12/29/23 Yes  [provider]  esomeprazole (NEXIUM) 40 MG capsule Take 40 mg by mouth daily as needed (reflux).   Yes [provider]  fexofenadine (ALLEGRA) 180 MG tablet Take 180 mg by mouth daily as needed for allergies. 12/29/22  Yes [provider]  fluticasone (FLONASE) 50 MCG/ACT nasal spray Place 1 spray into both nostrils daily as needed. 12/29/22  Yes [provider]  hydroxyurea (HYDREA) 500 MG capsule Take 500 mg by mouth in the morning, at noon, and at bedtime. 06/22/23 09/20/23 Yes [provider]  hydrOXYzine (ATARAX) 25 MG tablet Take 25 mg by mouth daily as needed for anxiety.   Yes [provider]  lubiprostone (AMITIZA) 8 MCG capsule Take 8 mcg by mouth 2 (two) times daily with a meal. 06/01/23 05/31/24 Yes [provider]  ondansetron (ZOFRAN-ODT) 4 MG disintegrating tablet Take 4 mg by mouth daily as needed for nausea or vomiting. 07/02/23 07/09/23 Yes [provider]  Oxycodone HCl 10 MG TABS Take 10 mg by mouth every 6 (six) hours as needed (pain).   Yes [provider]  prazosin (MINIPRESS) 1 MG capsule Take 1 mg by mouth at bedtime.   Yes [provider]  pregabalin (LYRICA) 75 MG capsule Take 75 mg by mouth 3 (three) times daily.   Yes [provider]  promethazine (PHENERGAN) 12.5 MG tablet Take 12.5 mg by mouth every 6 (six) hours as needed for nausea or vomiting. 05/10/23  Yes [provider]  OLANZapine zydis (ZYPREXA) 5 MG disintegrating tablet Take 5 mg by mouth at bedtime. Patient not taking: Reported on 05/28/2023 04/29/23   [provider]  oxyCODONE (  OXYCONTIN) 10 mg 12 hr tablet Take 1 tablet (10 mg total) by mouth every 12 (twelve) hours. Patient not taking: Reported on 07/03/2023 06/08/23   Rometta Emery, MD    Current Facility-Administered Medications  Medication Dose Route Frequency Provider Last Rate Last Admin   0.9 %  sodium chloride infusion   Intravenous  Continuous Dolly Rias, MD 75 mL/hr at 07/03/23 0520 New Bag at 07/03/23 0520   albuterol (PROVENTIL) (2.5 MG/3ML) 0.083% nebulizer solution 2.5 mg  2.5 mg Nebulization Q4H PRN Dolly Rias, MD       folic acid (FOLVITE) tablet 1 mg  1 mg Oral Daily Dolly Rias, MD   1 mg at 07/03/23 0941   HYDROmorphone (DILAUDID) injection 1 mg  1 mg Intravenous Q4H PRN Dolly Rias, MD   1 mg at 07/03/23 4098   hydroxyurea (HYDREA) capsule 1,500 mg  1,500 mg Oral Daily Dolly Rias, MD   1,500 mg at 07/03/23 0941   hydrOXYzine (ATARAX) tablet 25 mg  25 mg Oral BID PRN Dolly Rias, MD       lidocaine (LIDODERM) 5 % 2 patch  2 patch Transdermal Q24H Dolly Rias, MD       melatonin tablet 6 mg  6 mg Oral QHS PRN Dolly Rias, MD       methocarbamol (ROBAXIN) tablet 500 mg  500 mg Oral Q8H PRN Dolly Rias, MD       ondansetron [facility]) injection 4 mg  4 mg Intravenous Q6H PRN Dolly Rias, MD   4 mg at 07/03/23 1191   oxyCODONE (Oxy IR/ROXICODONE) immediate release tablet 10 mg  10 mg Oral Q6H Dolly Rias, MD   10 mg at 07/03/23 4782   oxyCODONE (Oxy IR/ROXICODONE) immediate release tablet 5 mg  5 mg Oral Q6H PRN Dolly Rias, MD       Or   oxyCODONE (Oxy IR/ROXICODONE) immediate release tablet 10 mg  10 mg Oral Q6H PRN Segars, Christiane Ha, MD       pantoprazole (PROTONIX) injection 40 mg  40 mg Intravenous Domenica Fail, MD   40 mg at 07/03/23 0941   polyethylene glycol (MIRALAX / GLYCOLAX) packet 17 g  17 g Oral Daily PRN Dolly Rias, MD       prazosin (MINIPRESS) capsule 1 mg  1 mg Oral QHS Segars, Christiane Ha, MD       pregabalin (LYRICA) capsule 75 mg  75 mg Oral TID Dolly Rias, MD   75 mg at 07/03/23 0941    Allergies as of 07/02/2023 - Review Complete 07/02/2023  Allergen Reaction Noted   Other Swelling 01/23/2017   Prunus persica Swelling    Cherry Itching 07/26/2016   Cherry Itching, Swelling, and Other (See Comments) 08/23/2017    Morphine and codeine Hives and Itching 05/16/2021   Other Itching, Swelling, and Other (See Comments) 08/23/2017   Peach [prunus persica] Itching, Swelling, and Other (See Comments) 08/23/2017   Peanut butter flavoring agent (non-screening) Itching 09/23/2015   Peanut-containing drug products Itching, Swelling, and Other (See Comments) 08/23/2017   Plum pulp Itching, Swelling, and Other (See Comments) 08/23/2017    Family History  Problem Relation Age of Onset   Diabetes Paternal Aunt     Social History   Socioeconomic History   Marital status: Single    Spouse name: Not on file   Number of children: Not on file   Years of education: Not on file   Highest education level: Not on file  Occupational History  Not on file  Tobacco Use   Smoking status: Never   Smokeless tobacco: Never  Vaping Use   Vaping status: Never Used  Substance and Sexual Activity   Alcohol use: No   Drug use: Not on file   Sexual activity: Not on file  Other Topics Concern   Not on file  Social History Narrative   ** Merged History Encounter **       Social Drivers of Health   Financial Resource Strain: Low Risk  (03/27/2023)   Received from [facility]   Overall Financial Resource Strain (CARDIA)    Difficulty of Paying Living Expenses: Not very hard  Food Insecurity: No Food Insecurity (07/03/2023)   Hunger Vital Sign    Worried About Running Out of Food in the Last Year: Never true    Ran Out of Food in the Last Year: Never true  Transportation Needs: No Transportation Needs (07/03/2023)   PRAPARE - Administrator, Civil Service (Medical): No    Lack of Transportation (Non-Medical): No  Physical Activity: Insufficiently Active (12/10/2021)   Received from [facility]   Exercise Vital Sign    Days of Exercise per Week: 5 days    Minutes of Exercise per Session: 20 min  Stress: Stress Concern Present (12/10/2021)   Received from [facility] Occupational [facility] - Occupational Stress Questionnaire    Feeling of Stress : Very much  Social Connections: Patient Declined (07/03/2023)   Social Connection and Isolation Panel [NHANES]    Frequency of Communication with Friends and Family: Patient declined    Frequency of Social Gatherings with Friends and Family: Patient declined    Attends Religious Services: Patient declined    Active Member of Clubs or Organizations: Patient declined    Attends Banker Meetings: Patient declined    Marital Status: Patient declined  Intimate Partner Violence: Not At Risk (07/03/2023)   Humiliation, Afraid, Rape, and Kick questionnaire    Fear of Current or Ex-Partner: No    Emotionally Abused: No    Physically Abused: No    Sexually Abused: No    Review of Systems: As per HPI  Physical Exam: Vital signs in last 24 hours: Temp:  [98 F (36.7 C)-98.6 F (37 C)] 98 F (36.7 C) (02/17 1029) Pulse Rate:  [71-94] 71 (02/17 1029) Resp:  [16-20] 16 (02/17 1029) BP: (101-130)/(53-103) 114/65 (02/17 1029) SpO2:  [99 %-100 %] 100 % (02/17 1029) Last BM Date : 07/02/23  General:   Alert,  Well-developed, well-nourished, pleasant and cooperative in NAD Head:  Normocephalic and atraumatic. Eyes:  Sclera clear, no icterus.   Prominent pallor Ears:  Normal auditory acuity. Nose:  No deformity, discharge,  or lesions. Mouth:  No deformity or lesions.  Oropharynx pink & moist. Neck:  Supple; no masses or thyromegaly. Lungs:  Clear throughout to auscultation.   No wheezes, crackles, or rhonchi. No acute distress. Heart:  Regular rate and rhythm; no murmurs, clicks, rubs,  or gallops. Extremities:  Without clubbing or edema. Neurologic:  Alert and  oriented x4;  grossly normal neurologically. Skin:  Intact without significant lesions or rashes. Psych:  Alert and cooperative. Normal mood and affect. Abdomen:  Soft, nontender and nondistended. No masses,  hepatosplenomegaly or hernias noted. Normal bowel sounds, without guarding, and without rebound.         Lab Results: Recent Labs    07/02/23 1111 07/03/23 0023  07/03/23 0700  WBC 9.3 9.3 8.1  HGB 9.6* 8.8* 8.6*  HCT 27.7* 25.9* 24.7*  PLT 407* 354 342   BMET Recent Labs    07/02/23 1111 07/03/23 0700  NA 137 135  K 4.0 3.6  CL 104 108  CO2 22 17*  GLUCOSE 101* 92  BUN 10 8  CREATININE 0.50 0.40*  CALCIUM 9.6 8.9   LFT Recent Labs    07/03/23 0700  PROT 7.9  ALBUMIN 3.8  AST 23  ALT 41  ALKPHOS 125  BILITOT 3.0*  BILIDIR 0.3*  IBILI 2.7*   PT/INR Recent Labs    07/03/23 0023  LABPROT 14.7  INR 1.1    Studies/Results: No results found.  Impression: Hematemesis, coffee-ground, dark stools, possible melena Hemoglobin 9.6/8.8/8.6 Stable hemodynamics Elevated MCV 103.8 Elevated T. 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 She has not had any nausea or vomiting since yesterday. Will start on regular diet, continue pantoprazole 40 mg twice a day. Plan diagnostic EGD in a.m.. The risks and the benefits of the procedure were discussed with the patient in details. She understands and verbalizes consent.   LOS: 1 day   Kerin Salen, MD  07/03/2023, 11:19 AM

## 2023-07-03 NOTE — Plan of Care (Signed)

## 2023-07-03 NOTE — Progress Notes (Signed)
 SICKLE CELL SERVICE PROGRESS NOTE  Bailey Reese UUV:[id number] DOB: 02/01/2006 DOA: 07/02/2023 PCP: Julious Oka, MD  Assessment/Plan: Principal Problem:   Vasoocclusive sickle cell crisis [facility]) Active Problems:   Sickle cell anemia with crisis (HCC)   Chronic pain syndrome   Obstructive sleep apnea syndrome   GI bleed  Sickle cell pain crisis: Patient is currently on Dilaudid PCA, Toradol IV fluids and short acting medications.  She has had issues with poor control with a long-acting medicine.  Patient previously did well with a long-acting medicine.  Will restart OxyContin. Reported GI bleed: Appears to be upper GI bleed. Anemia of chronic disease: Continue to monitor H&H Chronic pain syndrome: Continue to monitor continue home regimen. Obstructive sleep apnea: Not on CPAP.  Code Status: Full code Family Communication: No family at bedside Disposition Plan: Home  [facility]  Pager [phone]. If 7PM-7AM, please contact night-coverage.  07/03/2023, 9:11 PM  LOS: 1 day   Brief narrative: Bailey Reese is a 18 y.o. 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    Reports that on Thurs 2/13 developed epigastric pain and followed by hematemesis with bright red blood.  She has had intermittent hematemesis since this time.  Also having dark maroon stool.  Reports prior history of endoscopy and appears she had one in 10/' 24 at [facility] which is available in Care Everywhere and was normal, biopsy for H. Pylori neg for h pylori but did show gastritis / intestinal metaplasia, result included below.  Not taking any NSAIDs or other anticoagulants.  Due to her nausea and vomiting had been unable to tolerate oral pain medications which she thinks set off her sickle cell pain.  Also since she had been vomiting undigested pain pills has been taking additional pain  pills and is now out of her prescription; last fill was oxycodone 10 mg #35, for 7-day course on 2/12   She reports having diffuse pain including chest wall, lower back, and extremities mainly consistent with her prior sickle cell pain.  Pain is tolerable with current pain regimen  Consultants: Gastroenterology Dr. Marca Ancona  Procedures: Chest x-ray.  Scheduled for EGD tomorrow  Antibiotics: None  HPI/Subjective: Patient is complaining of 10 out of 10 pain but only on the short acting now.  Also scheduled for EGD tomorrow.  Objective: Vitals:   07/03/23 1029 07/03/23 1256 07/03/23 1748 07/03/23 2010  BP: 114/65 120/69 119/66 118/71  Pulse: 71 99 93 95  Resp: 16 16 16 17   Temp: 98 F (36.7 C) 97.9 F (36.6 C) 98.5 F (36.9 C) 98.4 F (36.9 C)  TempSrc: Oral Oral Oral Oral  SpO2: 100% 100% 100% 100%   Weight change:   Intake/Output Summary (Last 24 hours) at 07/03/2023 2111 Last data filed at 07/03/2023 2010 Gross per 24 hour  Intake 1235.94 ml  Output --  Net 1235.94 ml    General: Alert, awake, oriented x3, in no acute distress.  HEENT: /AT PEERL, EOMI Neck: Trachea midline,  no masses, no thyromegal,y no JVD, no carotid bruit OROPHARYNX:  Moist, No exudate/ erythema/lesions.  Heart: Regular rate and rhythm, without murmurs, rubs, gallops, PMI non-displaced, no heaves or thrills on palpation.  Lungs: Clear to auscultation, no wheezing or rhonchi noted. No increased vocal fremitus resonant to percussion  Abdomen: Soft, nontender, nondistended, positive bowel sounds, no masses no hepatosplenomegaly noted..  Neuro:  No focal neurological deficits noted cranial nerves II through XII grossly intact. DTRs 2+ bilaterally upper and lower extremities. Strength 5 out of 5 in bilateral upper and lower extremities. Musculoskeletal: No warm swelling or erythema around joints, no spinal tenderness noted. Psychiatric: Patient alert and oriented x3, good insight and cognition, good recent  to remote recall. Lymph node survey: No cervical axillary or inguinal lymphadenopathy noted.   Data Reviewed: Basic Metabolic Panel: Recent Labs  Lab 07/02/23 1111 07/03/23 0700  NA 137 135  K 4.0 3.6  CL 104 108  CO2 22 17*  GLUCOSE 101* 92  BUN 10 8  CREATININE 0.50 0.40*  CALCIUM 9.6 8.9  MG  --  2.0  PHOS  --  4.3   Liver Function Tests: Recent Labs  Lab 07/02/23 1111 07/03/23 0700  AST 38 23  ALT 55* 41  ALKPHOS 133* 125  BILITOT 3.3*  3.7* 3.0*  PROT 8.9* 7.9  ALBUMIN 4.7 3.8   Recent Labs  Lab 07/02/23 1111  LIPASE 38   No results for input(s): "AMMONIA" in the last 168 hours. CBC: Recent Labs  Lab 07/02/23 1111 07/03/23 0023 07/03/23 0700  WBC 9.3 9.3 8.1  HGB 9.6* 8.8* 8.6*  HCT 27.7* 25.9* 24.7*  MCV 103.4* 106.6* 103.8*  PLT 407* 354 342   Cardiac Enzymes: No results for input(s): "CKTOTAL", "CKMB", "CKMBINDEX", "TROPONINI" in the last 168 hours. BNP (last 3 results) No results for input(s): "BNP" in the last 8760 hours.  ProBNP (last 3 results) No results for input(s): "PROBNP" in the last 8760 hours.  CBG: No results for input(s): "GLUCAP" in the last 168 hours.  Recent Results (from the past 240 hours)  Resp panel by RT-PCR (RSV, Flu A&B, Covid) Anterior Nasal Swab     Status: None   Collection Time: 07/02/23 12:38 PM   Specimen: Anterior Nasal Swab  Result Value Ref Range Status   SARS Coronavirus 2 by RT PCR NEGATIVE NEGATIVE Final    Comment: (NOTE) SARS-CoV-2 target nucleic acids are NOT DETECTED.  The SARS-CoV-2 RNA is generally detectable in upper respiratory specimens during the acute phase of infection. The lowest concentration of SARS-CoV-2 viral copies this assay can detect is 138 copies/mL. A negative result does not preclude SARS-Cov-2 infection and should not be used as the sole basis for treatment or other patient management decisions. A negative result may occur with  improper specimen collection/handling,  submission of specimen other than nasopharyngeal swab, presence of viral mutation(s) within the areas targeted by this assay, and inadequate number of viral copies(<138 copies/mL). A negative result must be combined with clinical observations, patient history, and epidemiological information. The expected result is Negative.  Fact Sheet for Patients:  BloggerCourse.com  Fact Sheet for Healthcare Providers:  SeriousBroker.it  This test is no t yet approved or cleared by the [place] FDA and  has been authorized for detection and/or diagnosis of SARS-CoV-2 by FDA under an Emergency Use Authorization (EUA). This EUA will remain  in effect (meaning this test can be used) for the duration of the COVID-19 declaration under Section 564(b)(1) of the Act, 21 U.S.C.section 360bbb-3(b)(1), unless the authorization is terminated  or revoked sooner.       Influenza A by PCR NEGATIVE NEGATIVE Final   Influenza B by PCR NEGATIVE NEGATIVE Final    Comment: (NOTE) The Xpert Xpress SARS-CoV-2/FLU/RSV plus assay is intended as an aid in the diagnosis of influenza from Nasopharyngeal swab specimens and should not be used  as a sole basis for treatment. Nasal washings and aspirates are unacceptable for Xpert Xpress SARS-CoV-2/FLU/RSV testing.  Fact Sheet for Patients: BloggerCourse.com  Fact Sheet for Healthcare Providers: SeriousBroker.it  This test is not yet approved or cleared by the [place] FDA and has been authorized for detection and/or diagnosis of SARS-CoV-2 by FDA under an Emergency Use Authorization (EUA). This EUA will remain in effect (meaning this test can be used) for the duration of the COVID-19 declaration under Section 564(b)(1) of the Act, 21 U.S.C. section 360bbb-3(b)(1), unless the authorization is terminated or revoked.     Resp Syncytial Virus by PCR NEGATIVE  NEGATIVE Final    Comment: (NOTE) Fact Sheet for Patients: BloggerCourse.com  Fact Sheet for Healthcare Providers: SeriousBroker.it  This test is not yet approved or cleared by the [place] FDA and has been authorized for detection and/or diagnosis of SARS-CoV-2 by FDA under an Emergency Use Authorization (EUA). This EUA will remain in effect (meaning this test can be used) for the duration of the COVID-19 declaration under Section 564(b)(1) of the Act, 21 U.S.C. section 360bbb-3(b)(1), unless the authorization is terminated or revoked.  Performed at [facility], [address], [place], [place] [address]      Studies: No results found.  Scheduled Meds:  folic acid  1 mg Oral Daily   hydroxyurea  1,500 mg Oral Daily   lidocaine  2 patch Transdermal Q24H   oxyCODONE  10 mg Oral Q6H   oxyCODONE  10 mg Oral Q12H   pantoprazole (PROTONIX) IV  40 mg Intravenous Q12H   prazosin  1 mg Oral QHS   pregabalin  75 mg Oral TID   Continuous Infusions:  Principal Problem:   Vasoocclusive sickle cell crisis (HCC) Active Problems:   Sickle cell anemia with crisis (HCC)   Chronic pain syndrome   Obstructive sleep apnea syndrome   GI bleed

## 2023-07-03 NOTE — H&P (View-Only) (Signed)
 [facility] Consult  Referring Provider: Dionne Bucy, MD Primary Care Physician:  Julious Oka, MD Primary Gastroenterologist: Hollace Hayward, [facility]    Reason for Consultation:  Intermittent hematemesis and dark maroon stools  HPI: Bailey Reese is a 18 y.o. female states she was in her usual state of health until Thursday, 5 days ago when she developed nausea and vomiting.  She noticed that the vomitus contained dark blood, she had about 2 of those episodes each day and this was associated with epigastric, left upper and right upper quadrant abdominal pain.  She has also noticed that the stools have been unusually dark.  She denies noticing fresh blood in stool. She denies use of aspirin, Goody powders and NSAIDs. She takes Lyrica, oxycodone for pain control. At home she takes an acid suppressing medicine, thinks it is esomeprazole, denies issues with acid reflux, heartburn, difficulty swallowing, pain on swallowing, early satiety, unintentional weight loss loss of appetite.   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   Past Medical History:  Diagnosis Date   Anxiety    Depression    PTSD (post-traumatic stress disorder)    Sickle cell anemia (HCC)     Past Surgical History:  Procedure Laterality Date   CHOLECYSTECTOMY     WRIST SURGERY      Prior to Admission medications   Medication Sig Start Date End Date Taking? Authorizing Provider  budesonide-formoterol (SYMBICORT) 80-4.5 MCG/ACT inhaler Inhale 2 puffs into the lungs 2 (two) times daily as needed (wheezing, shortness of breath). 12/29/22 12/29/23 Yes  [provider]  esomeprazole (NEXIUM) 40 MG capsule Take 40 mg by mouth daily as needed (reflux).   Yes [provider]  fexofenadine (ALLEGRA) 180 MG tablet Take 180 mg by mouth daily as needed for allergies. 12/29/22  Yes [provider]  fluticasone (FLONASE) 50 MCG/ACT nasal spray Place 1 spray into both nostrils daily as needed. 12/29/22  Yes [provider]  hydroxyurea (HYDREA) 500 MG capsule Take 500 mg by mouth in the morning, at noon, and at bedtime. 06/22/23 09/20/23 Yes [provider]  hydrOXYzine (ATARAX) 25 MG tablet Take 25 mg by mouth daily as needed for anxiety.   Yes [provider]  lubiprostone (AMITIZA) 8 MCG capsule Take 8 mcg by mouth 2 (two) times daily with a meal. 06/01/23 05/31/24 Yes [provider]  ondansetron (ZOFRAN-ODT) 4 MG disintegrating tablet Take 4 mg by mouth daily as needed for nausea or vomiting. 07/02/23 07/09/23 Yes [provider]  Oxycodone HCl 10 MG TABS Take 10 mg by mouth every 6 (six) hours as needed (pain).   Yes [provider]  prazosin (MINIPRESS) 1 MG capsule Take 1 mg by mouth at bedtime.   Yes [provider]  pregabalin (LYRICA) 75 MG capsule Take 75 mg by mouth 3 (three) times daily.   Yes [provider]  promethazine (PHENERGAN) 12.5 MG tablet Take 12.5 mg by mouth every 6 (six) hours as needed for nausea or vomiting. 05/10/23  Yes [provider]  OLANZapine zydis (ZYPREXA) 5 MG disintegrating tablet Take 5 mg by mouth at bedtime. Patient not taking: Reported on 05/28/2023 04/29/23   [provider]  oxyCODONE (  OXYCONTIN) 10 mg 12 hr tablet Take 1 tablet (10 mg total) by mouth every 12 (twelve) hours. Patient not taking: Reported on 07/03/2023 06/08/23   Rometta Emery, MD    Current Facility-Administered Medications  Medication Dose Route Frequency Provider Last Rate Last Admin   0.9 %  sodium chloride infusion   Intravenous  Continuous Dolly Rias, MD 75 mL/hr at 07/03/23 0520 New Bag at 07/03/23 0520   albuterol (PROVENTIL) (2.5 MG/3ML) 0.083% nebulizer solution 2.5 mg  2.5 mg Nebulization Q4H PRN Dolly Rias, MD       folic acid (FOLVITE) tablet 1 mg  1 mg Oral Daily Dolly Rias, MD   1 mg at 07/03/23 0941   HYDROmorphone (DILAUDID) injection 1 mg  1 mg Intravenous Q4H PRN Dolly Rias, MD   1 mg at 07/03/23 4098   hydroxyurea (HYDREA) capsule 1,500 mg  1,500 mg Oral Daily Dolly Rias, MD   1,500 mg at 07/03/23 0941   hydrOXYzine (ATARAX) tablet 25 mg  25 mg Oral BID PRN Dolly Rias, MD       lidocaine (LIDODERM) 5 % 2 patch  2 patch Transdermal Q24H Dolly Rias, MD       melatonin tablet 6 mg  6 mg Oral QHS PRN Dolly Rias, MD       methocarbamol (ROBAXIN) tablet 500 mg  500 mg Oral Q8H PRN Dolly Rias, MD       ondansetron [facility]) injection 4 mg  4 mg Intravenous Q6H PRN Dolly Rias, MD   4 mg at 07/03/23 1191   oxyCODONE (Oxy IR/ROXICODONE) immediate release tablet 10 mg  10 mg Oral Q6H Dolly Rias, MD   10 mg at 07/03/23 4782   oxyCODONE (Oxy IR/ROXICODONE) immediate release tablet 5 mg  5 mg Oral Q6H PRN Dolly Rias, MD       Or   oxyCODONE (Oxy IR/ROXICODONE) immediate release tablet 10 mg  10 mg Oral Q6H PRN Segars, Christiane Ha, MD       pantoprazole (PROTONIX) injection 40 mg  40 mg Intravenous Domenica Fail, MD   40 mg at 07/03/23 0941   polyethylene glycol (MIRALAX / GLYCOLAX) packet 17 g  17 g Oral Daily PRN Dolly Rias, MD       prazosin (MINIPRESS) capsule 1 mg  1 mg Oral QHS Segars, Christiane Ha, MD       pregabalin (LYRICA) capsule 75 mg  75 mg Oral TID Dolly Rias, MD   75 mg at 07/03/23 0941    Allergies as of 07/02/2023 - Review Complete 07/02/2023  Allergen Reaction Noted   Other Swelling 01/23/2017   Prunus persica Swelling    Cherry Itching 07/26/2016   Cherry Itching, Swelling, and Other (See Comments) 08/23/2017    Morphine and codeine Hives and Itching 05/16/2021   Other Itching, Swelling, and Other (See Comments) 08/23/2017   Peach [prunus persica] Itching, Swelling, and Other (See Comments) 08/23/2017   Peanut butter flavoring agent (non-screening) Itching 09/23/2015   Peanut-containing drug products Itching, Swelling, and Other (See Comments) 08/23/2017   Plum pulp Itching, Swelling, and Other (See Comments) 08/23/2017    Family History  Problem Relation Age of Onset   Diabetes Paternal Aunt     Social History   Socioeconomic History   Marital status: Single    Spouse name: Not on file   Number of children: Not on file   Years of education: Not on file   Highest education level: Not on file  Occupational History  Not on file  Tobacco Use   Smoking status: Never   Smokeless tobacco: Never  Vaping Use   Vaping status: Never Used  Substance and Sexual Activity   Alcohol use: No   Drug use: Not on file   Sexual activity: Not on file  Other Topics Concern   Not on file  Social History Narrative   ** Merged History Encounter **       Social Drivers of Health   Financial Resource Strain: Low Risk  (03/27/2023)   Received from [facility]   Overall Financial Resource Strain (CARDIA)    Difficulty of Paying Living Expenses: Not very hard  Food Insecurity: No Food Insecurity (07/03/2023)   Hunger Vital Sign    Worried About Running Out of Food in the Last Year: Never true    Ran Out of Food in the Last Year: Never true  Transportation Needs: No Transportation Needs (07/03/2023)   PRAPARE - Administrator, Civil Service (Medical): No    Lack of Transportation (Non-Medical): No  Physical Activity: Insufficiently Active (12/10/2021)   Received from [facility]   Exercise Vital Sign    Days of Exercise per Week: 5 days    Minutes of Exercise per Session: 20 min  Stress: Stress Concern Present (12/10/2021)   Received from [facility] of Occupational Health - Occupational Stress Questionnaire    Feeling of Stress : Very much  Social Connections: Patient Declined (07/03/2023)   Social Connection and Isolation Panel [NHANES]    Frequency of Communication with Friends and Family: Patient declined    Frequency of Social Gatherings with Friends and Family: Patient declined    Attends Religious Services: Patient declined    Active Member of Clubs or Organizations: Patient declined    Attends Banker Meetings: Patient declined    Marital Status: Patient declined  Intimate Partner Violence: Not At Risk (07/03/2023)   Humiliation, Afraid, Rape, and Kick questionnaire    Fear of Current or Ex-Partner: No    Emotionally Abused: No    Physically Abused: No    Sexually Abused: No    Review of Systems: As per HPI  Physical Exam: Vital signs in last 24 hours: Temp:  [98 F (36.7 C)-98.6 F (37 C)] 98 F (36.7 C) (02/17 1029) Pulse Rate:  [71-94] 71 (02/17 1029) Resp:  [16-20] 16 (02/17 1029) BP: (101-130)/(53-103) 114/65 (02/17 1029) SpO2:  [99 %-100 %] 100 % (02/17 1029) Last BM Date : 07/02/23  General:   Alert,  Well-developed, well-nourished, pleasant and cooperative in NAD Head:  Normocephalic and atraumatic. Eyes:  Sclera clear, no icterus.   Prominent pallor Ears:  Normal auditory acuity. Nose:  No deformity, discharge,  or lesions. Mouth:  No deformity or lesions.  Oropharynx pink & moist. Neck:  Supple; no masses or thyromegaly. Lungs:  Clear throughout to auscultation.   No wheezes, crackles, or rhonchi. No acute distress. Heart:  Regular rate and rhythm; no murmurs, clicks, rubs,  or gallops. Extremities:  Without clubbing or edema. Neurologic:  Alert and  oriented x4;  grossly normal neurologically. Skin:  Intact without significant lesions or rashes. Psych:  Alert and cooperative. Normal mood and affect. Abdomen:  Soft, nontender and nondistended. No masses,  hepatosplenomegaly or hernias noted. Normal bowel sounds, without guarding, and without rebound.         Lab Results: Recent Labs    07/02/23 1111 07/03/23 0023  07/03/23 0700  WBC 9.3 9.3 8.1  HGB 9.6* 8.8* 8.6*  HCT 27.7* 25.9* 24.7*  PLT 407* 354 342   BMET Recent Labs    07/02/23 1111 07/03/23 0700  NA 137 135  K 4.0 3.6  CL 104 108  CO2 22 17*  GLUCOSE 101* 92  BUN 10 8  CREATININE 0.50 0.40*  CALCIUM 9.6 8.9   LFT Recent Labs    07/03/23 0700  PROT 7.9  ALBUMIN 3.8  AST 23  ALT 41  ALKPHOS 125  BILITOT 3.0*  BILIDIR 0.3*  IBILI 2.7*   PT/INR Recent Labs    07/03/23 0023  LABPROT 14.7  INR 1.1    Studies/Results: No results found.  Impression: Hematemesis, coffee-ground, dark stools, possible melena Hemoglobin 9.6/8.8/8.6 Stable hemodynamics Elevated MCV 103.8 Elevated T. 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 She has not had any nausea or vomiting since yesterday. Will start on regular diet, continue pantoprazole 40 mg twice a day. Plan diagnostic EGD in a.m.. The risks and the benefits of the procedure were discussed with the patient in details. She understands and verbalizes consent.   LOS: 1 day   Kerin Salen, MD  07/03/2023, 11:19 AM

## 2023-07-04 ENCOUNTER — Inpatient Hospital Stay (HOSPITAL_COMMUNITY): Payer: Medicaid Other | Admitting: Anesthesiology

## 2023-07-04 ENCOUNTER — Encounter (HOSPITAL_COMMUNITY): Payer: Self-pay | Admitting: Internal Medicine

## 2023-07-04 ENCOUNTER — Encounter (HOSPITAL_COMMUNITY)
Admission: EM | Disposition: A | Discharge status: Home / Self Care | Payer: Self-pay | Source: Other Acute Inpatient Hospital | Attending: Internal Medicine

## 2023-07-04 DIAGNOSIS — I10 Essential (primary) hypertension: Secondary | ICD-10-CM

## 2023-07-04 DIAGNOSIS — K449 Diaphragmatic hernia without obstruction or gangrene: Secondary | ICD-10-CM | POA: Diagnosis not present

## 2023-07-04 DIAGNOSIS — F418 Other specified anxiety disorders: Secondary | ICD-10-CM | POA: Diagnosis not present

## 2023-07-04 DIAGNOSIS — K209 Esophagitis, unspecified without bleeding: Secondary | ICD-10-CM

## 2023-07-04 DIAGNOSIS — D5700 Hb-SS disease with crisis, unspecified: Secondary | ICD-10-CM | POA: Diagnosis not present

## 2023-07-04 HISTORY — PX: BIOPSY: SHX5522

## 2023-07-04 HISTORY — PX: ESOPHAGOGASTRODUODENOSCOPY (EGD) WITH PROPOFOL: SHX5813

## 2023-07-04 SURGERY — ESOPHAGOGASTRODUODENOSCOPY (EGD) WITH PROPOFOL
Anesthesia: Monitor Anesthesia Care

## 2023-07-04 MED ORDER — FENTANYL CITRATE (PF) 100 MCG/2ML IJ SOLN
INTRAMUSCULAR | Status: DC | PRN
  Administered 2023-07-04: 50 ug via INTRAVENOUS

## 2023-07-04 MED ORDER — SUCRALFATE 1 G PO TABS
1.0000 g | ORAL_TABLET | Freq: Two times a day (BID) | ORAL | Status: DC
  Administered 2023-07-04 (×2): 1 g via ORAL
  Filled 2023-07-04 (×2): qty 1

## 2023-07-04 MED ORDER — LIDOCAINE HCL (CARDIAC) PF 100 MG/5ML IV SOSY
PREFILLED_SYRINGE | INTRAVENOUS | Status: DC | PRN
  Administered 2023-07-04: 50 mg via INTRAVENOUS

## 2023-07-04 MED ORDER — FENTANYL CITRATE (PF) 100 MCG/2ML IJ SOLN
INTRAMUSCULAR | Status: AC
  Filled 2023-07-04: qty 2

## 2023-07-04 MED ORDER — PROPOFOL 10 MG/ML IV BOLUS
INTRAVENOUS | Status: DC | PRN
  Administered 2023-07-04: 200 mg via INTRAVENOUS

## 2023-07-04 MED ORDER — PROPOFOL 500 MG/50ML IV EMUL
INTRAVENOUS | Status: AC
  Filled 2023-07-04: qty 50

## 2023-07-04 MED ORDER — PANTOPRAZOLE SODIUM 40 MG IV SOLR: 40.0000 mg | INTRAVENOUS | Status: DC

## 2023-07-04 MED ORDER — SODIUM CHLORIDE 0.9 % IV SOLN: INTRAVENOUS | Status: DC | PRN

## 2023-07-04 MED ORDER — ONDANSETRON HCL 4 MG/2ML IJ SOLN
INTRAMUSCULAR | Status: DC | PRN
  Administered 2023-07-04: 4 mg via INTRAVENOUS

## 2023-07-04 SURGICAL SUPPLY — 14 items

## 2023-07-04 NOTE — Anesthesia Postprocedure Evaluation (Signed)
 Anesthesia Post Note  Patient: Bailey Reese  Procedure(s) Performed: ESOPHAGOGASTRODUODENOSCOPY (EGD) WITH PROPOFOL BIOPSY     Patient location during evaluation: Endoscopy Anesthesia Type: MAC Level of consciousness: awake and alert, patient cooperative and oriented Pain management: pain level controlled Vital Signs Assessment: post-procedure vital signs reviewed and stable Respiratory status: spontaneous breathing, nonlabored ventilation, respiratory function stable and patient connected to nasal cannula oxygen Cardiovascular status: stable and blood pressure returned to baseline Postop Assessment: no apparent nausea or vomiting Anesthetic complications: no   No notable events documented.  Last Vitals:  Vitals:   07/04/23 0830 07/04/23 0835  BP: (!) 87/32   Pulse: (!) 104 94  Resp: (!) 23 (!) 25  Temp:    SpO2: 100% 100%    Last Pain:  Vitals:   07/04/23 0828  TempSrc: Temporal  PainSc: Asleep                 [name],E. Errik Mitchelle

## 2023-07-04 NOTE — Progress Notes (Signed)
 SICKLE CELL SERVICE PROGRESS NOTE  [name] UEA:[id number] DOB: [birth year] DOA: 07/02/2023 PCP: Julious Oka, MD  Assessment/Plan: Principal Problem:   Vasoocclusive sickle cell crisis [facility]) Active Problems:   Sickle cell anemia with crisis (HCC)   Chronic pain syndrome   Obstructive sleep apnea syndrome   GI bleed  Sickle cell pain crisis: Patient is currently on Dilaudid PCA, Toradol IV fluids and short acting medications.  She has had issues with poor control with a long-acting medicine.  Patient previously did well with a long-acting medicine.  Will restart OxyContin. Reported GI bleed: Patient had EGD done today.  It appears like patient has gastritis.  Recommendation is for PPI and sucralfate.  Biopsy with taken awaiting results. Anemia of chronic disease: Continue to monitor H&H Chronic pain syndrome: Continue to monitor continue home regimen. Obstructive sleep apnea: Not on CPAP.  Code Status: Full code Family Communication: No family at bedside Disposition Plan: Home  [facility]  Pager [phone]. If 7PM-7AM, please contact night-coverage.  07/04/2023, 9:06 PM  LOS: 2 days   Brief narrative: Bailey Reese is a 18 y.o. 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    Reports that on Thurs 2/13 developed epigastric pain and followed by hematemesis with bright red blood.  She has had intermittent hematemesis since this time.  Also having dark maroon stool.  Reports prior history of endoscopy and appears she had one in 10/' 24 at [facility] which is available in Care Everywhere and was normal, biopsy for H. Pylori neg for h pylori but did show gastritis / intestinal metaplasia, result included below.  Not taking any NSAIDs or other anticoagulants.  Due to her nausea and vomiting had been unable to tolerate oral pain medications which she thinks  set off her sickle cell pain.  Also since she had been vomiting undigested pain pills has been taking additional pain pills and is now out of her prescription; last fill was oxycodone 10 mg #35, for 7-day course on 2/12   She reports having diffuse pain including chest wall, lower back, and extremities mainly consistent with her prior sickle cell pain.  Pain is tolerable with current pain regimen  Consultants: Gastroenterology Dr. Marca Ancona  Procedures: Chest x-ray.  Scheduled for EGD tomorrow  Antibiotics: None  HPI/Subjective: Patient is complaining of 8 out of 10 pain and scheduled for EGD tomorrow.  Continue  Objective: Vitals:   07/04/23 0855 07/04/23 0913 07/04/23 1325 07/04/23 1804  BP: 117/67 (!) 107/54 113/60 125/70  Pulse: 91 89 (!) 102 (!) 101  Resp: 20 16 16 16   Temp:  99.1 F (37.3 C) 98.6 F (37 C) 98.7 F (37.1 C)  TempSrc:  Oral Oral Oral  SpO2: 100% 100% 100% 100%  Weight:      Height:       Weight change:   Intake/Output Summary (Last 24 hours) at 07/04/2023 2106 Last data filed at 07/04/2023 1804 Gross per 24 hour  Intake 680 ml  Output --  Net 680 ml    General: Alert, awake, oriented x3, in no acute distress.  HEENT: [place]/AT PEERL, EOMI Neck: Trachea midline,  no masses, no thyromegal,y no JVD, no carotid bruit OROPHARYNX:  Moist, No exudate/ erythema/lesions.  Heart: Regular rate and rhythm, without murmurs, rubs, gallops, PMI non-displaced, no heaves or thrills on palpation.  Lungs: Clear to auscultation, no wheezing  or rhonchi noted. No increased vocal fremitus resonant to percussion  Abdomen: Soft, nontender, nondistended, positive bowel sounds, no masses no hepatosplenomegaly noted..  Neuro: No focal neurological deficits noted cranial nerves II through XII grossly intact. DTRs 2+ bilaterally upper and lower extremities. Strength 5 out of 5 in bilateral upper and lower extremities. Musculoskeletal: No warm swelling or erythema around joints, no  spinal tenderness noted. Psychiatric: Patient alert and oriented x3, good insight and cognition, good recent to remote recall. Lymph node survey: No cervical axillary or inguinal lymphadenopathy noted.   Data Reviewed: Basic Metabolic Panel: Recent Labs  Lab 07/02/23 1111 07/03/23 0700  NA 137 135  K 4.0 3.6  CL 104 108  CO2 22 17*  GLUCOSE 101* 92  BUN 10 8  CREATININE 0.50 0.40*  CALCIUM 9.6 8.9  MG  --  2.0  PHOS  --  4.3   Liver Function Tests: Recent Labs  Lab 07/02/23 1111 07/03/23 0700  AST 38 23  ALT 55* 41  ALKPHOS 133* 125  BILITOT 3.3*  3.7* 3.0*  PROT 8.9* 7.9  ALBUMIN 4.7 3.8   Recent Labs  Lab 07/02/23 1111  LIPASE 38   No results for input(s): "AMMONIA" in the last 168 hours. CBC: Recent Labs  Lab 07/02/23 1111 07/03/23 0023 07/03/23 0700  WBC 9.3 9.3 8.1  HGB 9.6* 8.8* 8.6*  HCT 27.7* 25.9* 24.7*  MCV 103.4* 106.6* 103.8*  PLT 407* 354 342   Cardiac Enzymes: No results for input(s): "CKTOTAL", "CKMB", "CKMBINDEX", "TROPONINI" in the last 168 hours. BNP (last 3 results) No results for input(s): "BNP" in the last 8760 hours.  ProBNP (last 3 results) No results for input(s): "PROBNP" in the last 8760 hours.  CBG: No results for input(s): "GLUCAP" in the last 168 hours.  Recent Results (from the past 240 hours)  Resp panel by RT-PCR (RSV, Flu A&B, Covid) Anterior Nasal Swab     Status: None   Collection Time: 07/02/23 12:38 PM   Specimen: Anterior Nasal Swab  Result Value Ref Range Status   SARS Coronavirus 2 by RT PCR NEGATIVE NEGATIVE Final    Comment: (NOTE) SARS-CoV-2 target nucleic acids are NOT DETECTED.  The SARS-CoV-2 RNA is generally detectable in upper respiratory specimens during the acute phase of infection. The lowest concentration of SARS-CoV-2 viral copies this assay can detect is 138 copies/mL. A negative result does not preclude SARS-Cov-2 infection and should not be used as the sole basis for treatment  or other patient management decisions. A negative result may occur with  improper specimen collection/handling, submission of specimen other than nasopharyngeal swab, presence of viral mutation(s) within the areas targeted by this assay, and inadequate number of viral copies(<138 copies/mL). A negative result must be combined with clinical observations, patient history, and epidemiological information. The expected result is Negative.  Fact Sheet for Patients:  BloggerCourse.com  Fact Sheet for Healthcare Providers:  SeriousBroker.it  This test is no t yet approved or cleared by the [place] FDA and  has been authorized for detection and/or diagnosis of SARS-CoV-2 by FDA under an Emergency Use Authorization (EUA). This EUA will remain  in effect (meaning this test can be used) for the duration of the COVID-19 declaration under Section 564(b)(1) of the Act, 21 U.S.C.section 360bbb-3(b)(1), unless the authorization is terminated  or revoked sooner.       Influenza A by PCR NEGATIVE NEGATIVE Final   Influenza B by PCR NEGATIVE NEGATIVE Final    Comment: (NOTE)  The Xpert Xpress SARS-CoV-2/FLU/RSV plus assay is intended as an aid in the diagnosis of influenza from Nasopharyngeal swab specimens and should not be used as a sole basis for treatment. Nasal washings and aspirates are unacceptable for Xpert Xpress SARS-CoV-2/FLU/RSV testing.  Fact Sheet for Patients: BloggerCourse.com  Fact Sheet for Healthcare Providers: SeriousBroker.it  This test is not yet approved or cleared by the [place] FDA and has been authorized for detection and/or diagnosis of SARS-CoV-2 by FDA under an Emergency Use Authorization (EUA). This EUA will remain in effect (meaning this test can be used) for the duration of the COVID-19 declaration under Section 564(b)(1) of the Act, 21 U.S.C. section  360bbb-3(b)(1), unless the authorization is terminated or revoked.     Resp Syncytial Virus by PCR NEGATIVE NEGATIVE Final    Comment: (NOTE) Fact Sheet for Patients: BloggerCourse.com  Fact Sheet for Healthcare Providers: SeriousBroker.it  This test is not yet approved or cleared by the [place] FDA and has been authorized for detection and/or diagnosis of SARS-CoV-2 by FDA under an Emergency Use Authorization (EUA). This EUA will remain in effect (meaning this test can be used) for the duration of the COVID-19 declaration under Section 564(b)(1) of the Act, 21 U.S.C. section 360bbb-3(b)(1), unless the authorization is terminated or revoked.  Performed at [facility], [address], [place], [place] [address]      Studies: No results found.  Scheduled Meds:  folic acid  1 mg Oral Daily   hydroxyurea  1,500 mg Oral Daily   lidocaine  2 patch Transdermal Q24H   oxyCODONE  10 mg Oral Q6H   oxyCODONE  10 mg Oral Q12H   [START ON 07/05/2023] pantoprazole (PROTONIX) IV  40 mg Intravenous Q24H   prazosin  1 mg Oral QHS   pregabalin  75 mg Oral TID   sucralfate  1 g Oral BID   Continuous Infusions:  Principal Problem:   Vasoocclusive sickle cell crisis (HCC) Active Problems:   Sickle cell anemia with crisis (HCC)   Chronic pain syndrome   Obstructive sleep apnea syndrome   GI bleed

## 2023-07-04 NOTE — Anesthesia Preprocedure Evaluation (Addendum)
 Anesthesia Evaluation  Patient identified by MRN, date of birth, ID band Patient awake    Reviewed: Allergy & Precautions, NPO status , Patient's Chart, lab work & pertinent test results  History of Anesthesia Complications Negative for: history of anesthetic complications  Airway Mallampati: II  TM Distance: >3 FB Neck ROM: Full    Dental  (+) Dental Advisory Given   Pulmonary sleep apnea (does not use CPAP)    breath sounds clear to auscultation       Cardiovascular hypertension, Pt. on medications (-) angina  Rhythm:Regular Rate:Normal  '24 ECHO:  1. The left ventricle is normal in size with normal wall thickness.    2. The left ventricular systolic function is normal, LVEF > 55%.    3. The right ventricle is normal in size, with normal systolic function.    4. There are no significant valvular abnormalities.    5. There is evidence of a small right to left shunt by agitated saline, cannot discern intracardiac vs. pulmonary shunt on this study.      Neuro/Psych  PSYCHIATRIC DISORDERS (PTSD) Anxiety Depression       GI/Hepatic Neg liver ROS,GERD  Medicated and Controlled,,Here for N/V, not nauseated presently   Endo/Other  negative endocrine ROS    Renal/GU negative Renal ROS     Musculoskeletal   Abdominal   Peds  Hematology  (+) Blood dyscrasia (Hb 8.6, plt 342k), Sickle cell anemia and anemia   Anesthesia Other Findings   Reproductive/Obstetrics                             Anesthesia Physical Anesthesia Plan  ASA: 3  Anesthesia Plan: MAC   Post-op Pain Management: Minimal or no pain anticipated   Induction:   PONV Risk Score and Plan: 2 and TIVA, Ondansetron and Treatment may vary due to age or medical condition  Airway Management Planned: Natural Airway and Nasal Cannula  Additional Equipment: None  Intra-op Plan:   Post-operative Plan:   Informed Consent: I have  reviewed the patients History and Physical, chart, labs and discussed the procedure including the risks, benefits and alternatives for the proposed anesthesia with the patient or authorized representative who has indicated his/her understanding and acceptance.     Dental advisory given  Plan Discussed with: CRNA and Surgeon  Anesthesia Plan Comments:        Anesthesia Quick Evaluation

## 2023-07-04 NOTE — Interval H&P Note (Signed)
 History and Physical Interval Note: 18 year old female with intermittent hematemesis and dark maroon stools for an EGD with propofol.  07/04/2023 8:04 AM  Bailey Reese  has presented today for EGD with propofol, with the diagnosis of hematemesis.  The various methods of treatment have been discussed with the patient and family. After consideration of risks, benefits and other options for treatment, the patient has consented to  Procedure(s): ESOPHAGOGASTRODUODENOSCOPY (EGD) WITH PROPOFOL (N/A) as a surgical intervention.  The patient's history has been reviewed, patient examined, no change in status, stable for surgery.  I have reviewed the patient's chart and labs.  Questions were answered to the patient's satisfaction.     Kerin Salen

## 2023-07-04 NOTE — Transfer of Care (Signed)
 Immediate Anesthesia Transfer of Care Note  Patient: Nada Libman  Procedure(s) Performed: ESOPHAGOGASTRODUODENOSCOPY (EGD) WITH PROPOFOL BIOPSY  Patient Location: PACU and Endoscopy Unit  Anesthesia Type:MAC  Level of Consciousness: drowsy  Airway & Oxygen Therapy: Patient Spontanous Breathing and Patient connected to face mask oxygen  Post-op Assessment: Report given to RN and Post -op Vital signs reviewed and stable  Post vital signs: Reviewed and stable  Last Vitals:  Vitals Value Taken Time  BP 95/45   Temp    Pulse 102 07/04/23 0829  Resp 25 07/04/23 0829  SpO2 100 % 07/04/23 0829  Vitals shown include unfiled device data.  Last Pain:  Vitals:   07/04/23 0723  TempSrc: Oral  PainSc: 9       Patients Stated Pain Goal: 2 (07/04/23 0430)  Complications: No notable events documented.

## 2023-07-04 NOTE — Plan of Care (Signed)

## 2023-07-04 NOTE — Op Note (Signed)
 [facility] Patient Name: Bailey Reese Procedure Date: 07/04/2023 MRN: [id number] Attending MD: Kerin Salen , MD, [id number] Date of Birth: Dec 10, 2005 CSN: [id number] Age: 18 Admit Type: Inpatient Procedure:                Upper GI endoscopy Indications:              Hematemesis, Melena, Nausea with vomiting Providers:                Kerin Salen, MD, Rogue Jury, RN, Marja Kays, Technician Referring MD:             [facility] Medicines:                Monitored Anesthesia Care Complications:            No immediate complications. Estimated blood loss:                            Minimal. Estimated Blood Loss:     Estimated blood loss was minimal. Procedure:                Pre-Anesthesia Assessment:                           - Prior to the procedure, a History and Physical                            was performed, and patient medications and                            allergies were reviewed. The patient's tolerance of                            previous anesthesia was also reviewed. The risks                            and benefits of the procedure and the sedation                            options and risks were discussed with the patient.                            All questions were answered, and informed consent                            was obtained. Prior Anticoagulants: The patient has                            taken no anticoagulant or antiplatelet agents. ASA                            Grade Assessment: III - A patient with severe  systemic disease. After reviewing the risks and                            benefits, the patient was deemed in satisfactory                            condition to undergo the procedure.                           After obtaining informed consent, the endoscope was                            passed under direct vision. Throughout the                             procedure, the patient's blood pressure, pulse, and                            oxygen saturations were monitored continuously. The                            [id number]-H190 ([id number]) Olympus endoscope was introduced                            through the mouth, and advanced to the second part                            of duodenum. The upper GI endoscopy was                            accomplished without difficulty. The patient                            tolerated the procedure well. Scope In: Scope Out: Findings:      LA Grade B (one or more mucosal breaks greater than 5 mm, not extending       between the tops of two mucosal folds) esophagitis with no bleeding was       found 31 to 33 cm from the incisors. Biopsies were taken with a cold       forceps for histology.      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      A 2 cm hiatal hernia was present.      Patchy mildly erythematous mucosa without bleeding was found in the       gastric body and in the gastric antrum. Biopsies were taken with a cold       forceps for Helicobacter pylori testing.      The examined duodenum was normal. Biopsies for histology were taken with       a cold forceps for evaluation of celiac disease. Impression:               - LA Grade B esophagitis with no bleeding. Biopsied.                           - 2 cm hiatal hernia.                           -  Erythematous mucosa in the gastric body and                            antrum. Biopsied.                           - Normal examined duodenum. Biopsied. Moderate Sedation:      Patient did not receive moderate sedation for this procedure, but       instead received monitored anesthesia care. Recommendation:           - Resume regular diet.                           - Continue present medications.                           - Await pathology results.                           - Continue PPI once a day and take sucralfate 1 gm                             twice a day for 1 week. Procedure Code(s):        --- Professional ---                           [phone], Esophagogastroduodenoscopy, flexible,                            transoral; with biopsy, single or multiple Diagnosis Code(s):        --- Professional ---                           K20.90, Esophagitis, unspecified without bleeding                           K44.9, Diaphragmatic hernia without obstruction or                            gangrene                           K31.89, Other diseases of stomach and duodenum                           K92.0, Hematemesis                           K92.1, Melena (includes Hematochezia)                           R11.2, Nausea with vomiting, unspecified CPT copyright 2022 [employer]. All rights reserved. The codes documented in this report are preliminary and upon coder review may  be revised to meet current compliance requirements. Kerin Salen, MD 07/04/2023 8:29:53 AM This report has been signed electronically. Number of Addenda: 0

## 2023-07-05 DIAGNOSIS — D5700 Hb-SS disease with crisis, unspecified: Secondary | ICD-10-CM | POA: Diagnosis not present

## 2023-07-05 LAB — CBC WITH DIFFERENTIAL/PLATELET
Abs Immature Granulocytes: 0.04 10*3/uL (ref 0.00–0.07)
Basophils Absolute: 0.1 10*3/uL (ref 0.0–0.1)
Basophils Relative: 1 %
Eosinophils Absolute: 0.5 10*3/uL (ref 0.0–0.5)
Eosinophils Relative: 5 %
HCT: 26.3 % — ABNORMAL LOW (ref 36.0–46.0)
Hemoglobin: 9.4 g/dL — ABNORMAL LOW (ref 12.0–15.0)
Immature Granulocytes: 0 %
Lymphocytes Relative: 40 %
Lymphs Abs: 3.8 10*3/uL (ref 0.7–4.0)
MCH: 36.7 pg — ABNORMAL HIGH (ref 26.0–34.0)
MCHC: 35.7 g/dL (ref 30.0–36.0)
MCV: 102.7 fL — ABNORMAL HIGH (ref 80.0–100.0)
Monocytes Absolute: 1.2 10*3/uL — ABNORMAL HIGH (ref 0.1–1.0)
Monocytes Relative: 13 %
Neutro Abs: 3.8 10*3/uL (ref 1.7–7.7)
Neutrophils Relative %: 41 %
Platelets: 264 10*3/uL (ref 150–400)
RBC: 2.56 MIL/uL — ABNORMAL LOW (ref 3.87–5.11)
RDW: 18.6 % — ABNORMAL HIGH (ref 11.5–15.5)
WBC: 9.4 10*3/uL (ref 4.0–10.5)
nRBC: 0.9 % — ABNORMAL HIGH (ref 0.0–0.2)

## 2023-07-05 LAB — COMPREHENSIVE METABOLIC PANEL
ALT: 30 U/L (ref 0–44)
AST: 22 U/L (ref 15–41)
Albumin: 4.2 g/dL (ref 3.5–5.0)
Alkaline Phosphatase: 125 U/L (ref 38–126)
Anion gap: 10 (ref 5–15)
BUN: 8 mg/dL (ref 6–20)
CO2: 23 mmol/L (ref 22–32)
Calcium: 9.4 mg/dL (ref 8.9–10.3)
Chloride: 103 mmol/L (ref 98–111)
Creatinine, Ser: 0.32 mg/dL — ABNORMAL LOW (ref 0.44–1.00)
GFR, Estimated: >60 mL/min (ref >60)
Glucose, Bld: 96 mg/dL (ref 70–99)
Potassium: 3.9 mmol/L (ref 3.5–5.1)
Sodium: 136 mmol/L (ref 135–145)
Total Bilirubin: 3.3 mg/dL — ABNORMAL HIGH (ref 0.0–1.2)
Total Protein: 8.2 g/dL — ABNORMAL HIGH (ref 6.5–8.1)

## 2023-07-05 LAB — SURGICAL PATHOLOGY

## 2023-07-05 MED ORDER — PANTOPRAZOLE SODIUM 40 MG PO TBEC: 40.0000 mg | DELAYED_RELEASE_TABLET | Freq: Two times a day (BID) | ORAL | 1 refills | Status: AC

## 2023-07-05 MED ORDER — SUCRALFATE 1 G PO TABS: 1.0000 g | ORAL_TABLET | Freq: Two times a day (BID) | ORAL | 0 refills | Status: DC

## 2023-07-05 MED ORDER — OXYCODONE HCL 10 MG PO TABS: 10.0000 mg | ORAL_TABLET | Freq: Four times a day (QID) | ORAL | 0 refills | Status: DC | PRN

## 2023-07-05 MED ORDER — OXYCODONE HCL ER 10 MG PO T12A: 10.0000 mg | EXTENDED_RELEASE_TABLET | Freq: Two times a day (BID) | ORAL | 0 refills | Status: DC

## 2023-07-05 NOTE — Plan of Care (Signed)

## 2023-07-05 NOTE — Discharge Summary (Signed)
 Physician Discharge Summary   Patient: Bailey Reese MRN: [id number] DOB: [birth year]  Admit date:     07/02/2023  Discharge date: 07/05/2023  Discharge Physician: Lonia Blood   PCP: Julious Oka, MD   Recommendations at discharge:    Patient to follow up with her PCP Continue PPI twice  Discharge Diagnoses: Principal Problem:   Vasoocclusive sickle cell crisis (HCC) Active Problems:   Sickle cell anemia with crisis (HCC)   Chronic pain syndrome   Obstructive sleep apnea syndrome   GI bleed  Resolved Problems:   * No resolved hospital problems. [facility] Course: Patient was admitted after being transferred from [facility] hospital with GI bleed as well as sickle cell crisis.  She appears to have had reported hematemesis.  Patient was admitted.  Serial H&H.  Did not require any transfusion.  GI consulted with subsequent EGD performed strategies and some stomach ulceration.  Biopsies were taken.  Recommended taking PPI twice a day as sucralfate for another month.  Patient's pain was treated with IV Dilaudid PCA, oral medications as well as IV fluids.  She was recently started on long-acting medicine but she ran out.  Patient was given prescription for both the long-acting and short acting at time of discharge.  She is to continue follow-up with her PCP and other current medications.  Assessment and Plan: No notes have been filed under this hospital service. Service: Hospitalist        Consultants: None Procedures performed: None  Disposition: Home Diet recommendation:  Discharge Diet Orders (From admission, onward)     Start     Ordered   07/05/23 0000  Diet - low sodium heart healthy        07/05/23 0706           Regular diet DISCHARGE MEDICATION: Allergies as of 07/05/2023       Reactions   Other Swelling   Pitted fruits   Prunus Persica Swelling   Lips swell    Cherry Itching   Lips itch   Cherry Itching, Swelling, Other (See  Comments)   Mouth and throat get itchy & lips swell; throat tightens   Morphine And Codeine Hives, Itching   Other Itching, Swelling, Other (See Comments)   PITTED FRUITS = Mouth and throat get itchy & lips swell; throat tightens   Peach [prunus Persica] Itching, Swelling, Other (See Comments)   Mouth and throat get itchy & lips swell; throat tightens   Peanut Butter Flavoring Agent (non-screening) Itching   Per pt "mouth gets itchy"   Peanut-containing Drug Products Itching, Swelling, Other (See Comments)   Mouth and throat get itchy & lips swell; throat tightens; chest tightens   Plum Pulp Itching, Swelling, Other (See Comments)   Mouth and throat get itchy & lips swell; throat tightens        Medication List     TAKE these medications    budesonide-formoterol 80-4.5 MCG/ACT inhaler Commonly known as: SYMBICORT Inhale 2 puffs into the lungs 2 (two) times daily as needed (wheezing, shortness of breath).   esomeprazole 40 MG capsule Commonly known as: NEXIUM Take 40 mg by mouth daily as needed (reflux).   fexofenadine 180 MG tablet Commonly known as: ALLEGRA Take 180 mg by mouth daily as needed for allergies.   fluticasone 50 MCG/ACT nasal spray Commonly known as: FLONASE Place 1 spray into both nostrils daily as needed.   hydroxyurea 500 MG capsule Commonly known as: HYDREA Take 500 mg by mouth  in the morning, at noon, and at bedtime.   hydrOXYzine 25 MG tablet Commonly known as: ATARAX Take 25 mg by mouth daily as needed for anxiety.   lubiprostone 8 MCG capsule Commonly known as: AMITIZA Take 8 mcg by mouth 2 (two) times daily with a meal.   OLANZapine zydis 5 MG disintegrating tablet Commonly known as: ZYPREXA Take 5 mg by mouth at bedtime.   oxyCODONE 10 mg 12 hr tablet Commonly known as: OXYCONTIN Take 1 tablet (10 mg total) by mouth every 12 (twelve) hours.   Oxycodone HCl 10 MG Tabs Take 1 tablet (10 mg total) by mouth every 6 (six) hours as needed  (pain).   pantoprazole 40 MG tablet Commonly known as: Protonix Take 1 tablet (40 mg total) by mouth 2 (two) times daily.   prazosin 1 MG capsule Commonly known as: MINIPRESS Take 1 mg by mouth at bedtime.   pregabalin 75 MG capsule Commonly known as: LYRICA Take 75 mg by mouth 3 (three) times daily.   promethazine 12.5 MG tablet Commonly known as: PHENERGAN Take 12.5 mg by mouth every 6 (six) hours as needed for nausea or vomiting.   sucralfate 1 g tablet Commonly known as: CARAFATE Take 1 tablet (1 g total) by mouth 2 (two) times daily.       ASK your doctor about these medications    ondansetron 4 MG disintegrating tablet Commonly known as: ZOFRAN-ODT Take 4 mg by mouth daily as needed for nausea or vomiting. Ask about: Should I take this medication?        Discharge Exam: Filed Weights   07/04/23 0723  Weight: 61.7 kg   Constitutional: NAD, calm, comfortable Eyes: PERRL, lids and conjunctivae normal ENMT: Mucous membranes are moist. Posterior pharynx clear of any exudate or lesions.Normal dentition.  Neck: normal, supple, no masses, no thyromegaly Respiratory: clear to auscultation bilaterally, no wheezing, no crackles. Normal respiratory effort. No accessory muscle use.  Cardiovascular: Regular rate and rhythm, no murmurs / rubs / gallops. No extremity edema. 2+ pedal pulses. No carotid bruits.  Abdomen: no tenderness, no masses palpated. No hepatosplenomegaly. Bowel sounds positive.  Musculoskeletal: Good range of motion, no joint swelling or tenderness, Skin: no rashes, lesions, ulcers. No induration Neurologic: CN 2-12 grossly intact. Sensation intact, DTR normal. Strength 5/5 in all 4.  Psychiatric: Normal judgment and insight. Alert and oriented x 3. Normal mood   Condition at discharge: good  The results of significant diagnostics from this hospitalization (including imaging, microbiology, ancillary and laboratory) are listed below for reference.    Imaging Studies: No results found.  Microbiology: Results for orders placed or performed during the hospital encounter of 07/02/23  Resp panel by RT-PCR (RSV, Flu A&B, Covid) Anterior Nasal Swab     Status: None   Collection Time: 07/02/23 12:38 PM   Specimen: Anterior Nasal Swab  Result Value Ref Range Status   SARS Coronavirus 2 by RT PCR NEGATIVE NEGATIVE Final    Comment: (NOTE) SARS-CoV-2 target nucleic acids are NOT DETECTED.  The SARS-CoV-2 RNA is generally detectable in upper respiratory specimens during the acute phase of infection. The lowest concentration of SARS-CoV-2 viral copies this assay can detect is 138 copies/mL. A negative result does not preclude SARS-Cov-2 infection and should not be used as the sole basis for treatment or other patient management decisions. A negative result may occur with  improper specimen collection/handling, submission of specimen other than nasopharyngeal swab, presence of viral mutation(s) within the areas targeted by  this assay, and inadequate number of viral copies(<138 copies/mL). A negative result must be combined with clinical observations, patient history, and epidemiological information. The expected result is Negative.  Fact Sheet for Patients:  BloggerCourse.com  Fact Sheet for Healthcare Providers:  SeriousBroker.it  This test is no t yet approved or cleared by the [place] FDA and  has been authorized for detection and/or diagnosis of SARS-CoV-2 by FDA under an Emergency Use Authorization (EUA). This EUA will remain  in effect (meaning this test can be used) for the duration of the COVID-19 declaration under Section 564(b)(1) of the Act, 21 U.S.C.section 360bbb-3(b)(1), unless the authorization is terminated  or revoked sooner.       Influenza A by PCR NEGATIVE NEGATIVE Final   Influenza B by PCR NEGATIVE NEGATIVE Final    Comment: (NOTE) The Xpert Xpress  SARS-CoV-2/FLU/RSV plus assay is intended as an aid in the diagnosis of influenza from Nasopharyngeal swab specimens and should not be used as a sole basis for treatment. Nasal washings and aspirates are unacceptable for Xpert Xpress SARS-CoV-2/FLU/RSV testing.  Fact Sheet for Patients: BloggerCourse.com  Fact Sheet for Healthcare Providers: SeriousBroker.it  This test is not yet approved or cleared by the [place] FDA and has been authorized for detection and/or diagnosis of SARS-CoV-2 by FDA under an Emergency Use Authorization (EUA). This EUA will remain in effect (meaning this test can be used) for the duration of the COVID-19 declaration under Section 564(b)(1) of the Act, 21 U.S.C. section 360bbb-3(b)(1), unless the authorization is terminated or revoked.     Resp Syncytial Virus by PCR NEGATIVE NEGATIVE Final    Comment: (NOTE) Fact Sheet for Patients: BloggerCourse.com  Fact Sheet for Healthcare Providers: SeriousBroker.it  This test is not yet approved or cleared by the [place] FDA and has been authorized for detection and/or diagnosis of SARS-CoV-2 by FDA under an Emergency Use Authorization (EUA). This EUA will remain in effect (meaning this test can be used) for the duration of the COVID-19 declaration under Section 564(b)(1) of the Act, 21 U.S.C. section 360bbb-3(b)(1), unless the authorization is terminated or revoked.  Performed at [facility], [address] [place], [place] [address]     Labs: CBC: No results for input(s): "WBC", "NEUTROABS", "HGB", "HCT", "MCV", "PLT" in the last 168 hours.  Basic Metabolic Panel: No results for input(s): "NA", "K", "CL", "CO2", "GLUCOSE", "BUN", "CREATININE", "CALCIUM", "MG", "PHOS" in the last 168 hours.  Liver Function Tests: No results for input(s): "AST", "ALT", "ALKPHOS", "BILITOT", "PROT",  "ALBUMIN" in the last 168 hours.  CBG: No results for input(s): "GLUCAP" in the last 168 hours.  Discharge time spent: greater than 30 minutes.  SignedLonia Blood, MD [facility] 07/05/2023

## 2023-07-05 NOTE — Plan of Care (Addendum)
 0800: Patient discharge home, PIV removed, discharge paperwork handed to patient, no further needs atm.  Problem: Education: Goal: Knowledge of General Education information will improve Description: Including pain rating scale, medication(s)/side effects and non-pharmacologic comfort measures Outcome: Completed/Met   Problem: Health Behavior/Discharge Planning: Goal: Ability to manage health-related needs will improve Outcome: Completed/Met   Problem: Clinical Measurements: Goal: Ability to maintain clinical measurements within normal limits will improve Outcome: Completed/Met Goal: Will remain free from infection Outcome: Completed/Met Goal: Diagnostic test results will improve Outcome: Completed/Met Goal: Respiratory complications will improve Outcome: Completed/Met Goal: Cardiovascular complication will be avoided Outcome: Completed/Met   Problem: Activity: Goal: Risk for activity intolerance will decrease Outcome: Completed/Met   Problem: Nutrition: Goal: Adequate nutrition will be maintained Outcome: Completed/Met   Problem: Coping: Goal: Level of anxiety will decrease Outcome: Completed/Met   Problem: Elimination: Goal: Will not experience complications related to bowel motility Outcome: Completed/Met Goal: Will not experience complications related to urinary retention Outcome: Completed/Met   Problem: Pain Managment: Goal: General experience of comfort will improve and/or be controlled Outcome: Completed/Met   Problem: Safety: Goal: Ability to remain free from injury will improve Outcome: Completed/Met   Problem: Skin Integrity: Goal: Risk for impaired skin integrity will decrease Outcome: Completed/Met

## 2023-07-07 ENCOUNTER — Encounter (HOSPITAL_COMMUNITY): Payer: Self-pay | Admitting: Gastroenterology

## 2023-07-16 NOTE — Hospital Course (Signed)
 Patient was admitted after being transferred from [facility] with GI bleed as well as sickle cell crisis.  She appears to have had reported hematemesis.  Patient was admitted.  Serial H&H.  Did not require any transfusion.  GI consulted with subsequent EGD performed strategies and some stomach ulceration.  Biopsies were taken.  Recommended taking PPI twice a day as sucralfate for another month.  Patient's pain was treated with IV Dilaudid PCA, oral medications as well as IV fluids.  She was recently started on long-acting medicine but she ran out.  Patient was given prescription for both the long-acting and short acting at time of discharge.  She is to continue follow-up with her PCP and other current medications.

## 2023-07-23 DIAGNOSIS — M87051 Idiopathic aseptic necrosis of right femur: Secondary | ICD-10-CM | POA: Insufficient documentation

## 2023-08-21 ENCOUNTER — Other Ambulatory Visit: Payer: Self-pay

## 2023-08-21 ENCOUNTER — Emergency Department

## 2023-08-21 ENCOUNTER — Inpatient Hospital Stay (HOSPITAL_COMMUNITY)
Admission: AD | Admit: 2023-08-21 | Discharge: 2023-08-29 | DRG: 812 | Disposition: A | Discharge status: Home / Self Care | Source: Other Acute Inpatient Hospital | Attending: Internal Medicine | Admitting: Internal Medicine

## 2023-08-21 ENCOUNTER — Emergency Department
Admission: EM | Admit: 2023-08-21 | Discharge: 2023-08-21 | Disposition: A | Discharge status: Other Acute Inpatient Hospital

## 2023-08-21 ENCOUNTER — Encounter (HOSPITAL_COMMUNITY): Payer: Self-pay

## 2023-08-21 DIAGNOSIS — F32A Depression, unspecified: Secondary | ICD-10-CM | POA: Diagnosis present

## 2023-08-21 DIAGNOSIS — F4310 Post-traumatic stress disorder, unspecified: Secondary | ICD-10-CM | POA: Diagnosis present

## 2023-08-21 DIAGNOSIS — D72829 Elevated white blood cell count, unspecified: Secondary | ICD-10-CM | POA: Diagnosis present

## 2023-08-21 DIAGNOSIS — Z833 Family history of diabetes mellitus: Secondary | ICD-10-CM | POA: Diagnosis not present

## 2023-08-21 DIAGNOSIS — M25562 Pain in left knee: Secondary | ICD-10-CM | POA: Diagnosis present

## 2023-08-21 DIAGNOSIS — R000 Tachycardia, unspecified: Secondary | ICD-10-CM | POA: Diagnosis present

## 2023-08-21 DIAGNOSIS — Z886 Allergy status to analgesic agent status: Secondary | ICD-10-CM | POA: Diagnosis not present

## 2023-08-21 DIAGNOSIS — G894 Chronic pain syndrome: Secondary | ICD-10-CM | POA: Diagnosis present

## 2023-08-21 DIAGNOSIS — R5381 Other malaise: Secondary | ICD-10-CM | POA: Diagnosis present

## 2023-08-21 DIAGNOSIS — R6510 Systemic inflammatory response syndrome (SIRS) of non-infectious origin without acute organ dysfunction: Secondary | ICD-10-CM | POA: Diagnosis not present

## 2023-08-21 DIAGNOSIS — Z7951 Long term (current) use of inhaled steroids: Secondary | ICD-10-CM | POA: Diagnosis not present

## 2023-08-21 DIAGNOSIS — Z9049 Acquired absence of other specified parts of digestive tract: Secondary | ICD-10-CM

## 2023-08-21 DIAGNOSIS — D5700 Hb-SS disease with crisis, unspecified: Secondary | ICD-10-CM | POA: Insufficient documentation

## 2023-08-21 DIAGNOSIS — Z8673 Personal history of transient ischemic attack (TIA), and cerebral infarction without residual deficits: Secondary | ICD-10-CM | POA: Diagnosis not present

## 2023-08-21 DIAGNOSIS — M25561 Pain in right knee: Secondary | ICD-10-CM | POA: Diagnosis present

## 2023-08-21 DIAGNOSIS — F419 Anxiety disorder, unspecified: Secondary | ICD-10-CM | POA: Diagnosis present

## 2023-08-21 DIAGNOSIS — Z91018 Allergy to other foods: Secondary | ICD-10-CM | POA: Diagnosis not present

## 2023-08-21 DIAGNOSIS — Z888 Allergy status to other drugs, medicaments and biological substances status: Secondary | ICD-10-CM | POA: Diagnosis not present

## 2023-08-21 DIAGNOSIS — Z79899 Other long term (current) drug therapy: Secondary | ICD-10-CM | POA: Diagnosis not present

## 2023-08-21 DIAGNOSIS — Z91010 Allergy to peanuts: Secondary | ICD-10-CM

## 2023-08-21 DIAGNOSIS — Z1152 Encounter for screening for COVID-19: Secondary | ICD-10-CM

## 2023-08-21 LAB — RETICULOCYTES
Immature Retic Fract: 2.8 % (ref 2.3–15.9)
RBC.: 1.95 MIL/uL — ABNORMAL LOW (ref 3.87–5.11)
Retic Count, Absolute: 314 10*3/uL — ABNORMAL HIGH (ref 19.0–186.0)
Retic Ct Pct: 16.1 % — ABNORMAL HIGH (ref 0.4–3.1)

## 2023-08-21 LAB — CBC WITH DIFFERENTIAL/PLATELET
Abs Immature Granulocytes: 0.44 10*3/uL — ABNORMAL HIGH (ref 0.00–0.07)
Basophils Absolute: 0.1 10*3/uL (ref 0.0–0.1)
Basophils Relative: 1 %
Eosinophils Absolute: 0.5 10*3/uL (ref 0.0–0.5)
Eosinophils Relative: 3 %
HCT: 21.2 % — ABNORMAL LOW (ref 36.0–46.0)
Hemoglobin: 7.8 g/dL — ABNORMAL LOW (ref 12.0–15.0)
Immature Granulocytes: 3 %
Lymphocytes Relative: 23 %
Lymphs Abs: 3.5 10*3/uL (ref 0.7–4.0)
MCH: 37 pg — ABNORMAL HIGH (ref 26.0–34.0)
MCHC: 36.8 g/dL — ABNORMAL HIGH (ref 30.0–36.0)
MCV: 100.5 fL — ABNORMAL HIGH (ref 80.0–100.0)
Monocytes Absolute: 2.2 10*3/uL — ABNORMAL HIGH (ref 0.1–1.0)
Monocytes Relative: 14 %
Neutro Abs: 8.8 10*3/uL — ABNORMAL HIGH (ref 1.7–7.7)
Neutrophils Relative %: 56 %
Platelets: 238 10*3/uL (ref 150–400)
RBC: 2.11 MIL/uL — ABNORMAL LOW (ref 3.87–5.11)
RDW: 19.1 % — ABNORMAL HIGH (ref 11.5–15.5)
Smear Review: NORMAL
WBC: 15.5 10*3/uL — ABNORMAL HIGH (ref 4.0–10.5)
nRBC: 3 % — ABNORMAL HIGH (ref 0.0–0.2)

## 2023-08-21 LAB — COMPREHENSIVE METABOLIC PANEL WITH GFR
ALT: 31 U/L (ref 0–44)
AST: 30 U/L (ref 15–41)
Albumin: 4 g/dL (ref 3.5–5.0)
Alkaline Phosphatase: 134 U/L — ABNORMAL HIGH (ref 38–126)
Anion gap: 9 (ref 5–15)
BUN: 12 mg/dL (ref 6–20)
CO2: 22 mmol/L (ref 22–32)
Calcium: 9.2 mg/dL (ref 8.9–10.3)
Chloride: 108 mmol/L (ref 98–111)
Creatinine, Ser: 0.49 mg/dL (ref 0.44–1.00)
GFR, Estimated: >60 mL/min (ref >60)
Glucose, Bld: 108 mg/dL — ABNORMAL HIGH (ref 70–99)
Potassium: 3.4 mmol/L — ABNORMAL LOW (ref 3.5–5.1)
Sodium: 139 mmol/L (ref 135–145)
Total Bilirubin: 4.5 mg/dL — ABNORMAL HIGH (ref 0.0–1.2)
Total Protein: 7.8 g/dL (ref 6.5–8.1)

## 2023-08-21 LAB — HCG, QUANTITATIVE, PREGNANCY: hCG, Beta Chain, Quant, S: <1 m[IU]/mL (ref <5)

## 2023-08-21 MED ORDER — PANTOPRAZOLE SODIUM 40 MG PO TBEC
40.0000 mg | DELAYED_RELEASE_TABLET | Freq: Every day | ORAL | Status: DC
  Administered 2023-08-21 – 2023-08-29 (×9): 40 mg via ORAL
  Filled 2023-08-21 (×9): qty 1

## 2023-08-21 MED ORDER — HYDROMORPHONE 1 MG/ML IV SOLN
INTRAVENOUS | Status: DC
  Administered 2023-08-21: 10.5 mg via INTRAVENOUS
  Administered 2023-08-21: 30 mg via INTRAVENOUS
  Administered 2023-08-22: 6.5 mg via INTRAVENOUS
  Administered 2023-08-22: 5.5 mg via INTRAVENOUS
  Administered 2023-08-22: 8.5 mg via INTRAVENOUS
  Administered 2023-08-22: 5.5 mg via INTRAVENOUS
  Administered 2023-08-22: 9.5 mg via INTRAVENOUS
  Administered 2023-08-22: 5 mg via INTRAVENOUS
  Administered 2023-08-22 – 2023-08-23 (×2): 30 mg via INTRAVENOUS
  Administered 2023-08-23: 7.5 mg via INTRAVENOUS
  Administered 2023-08-23: 8.5 mg via INTRAVENOUS
  Administered 2023-08-23: 5 mg via INTRAVENOUS
  Administered 2023-08-23: 6.5 mg via INTRAVENOUS
  Administered 2023-08-23: 30 mg via INTRAVENOUS
  Administered 2023-08-23: 6 mg via INTRAVENOUS
  Administered 2023-08-24: 11 mg via INTRAVENOUS
  Administered 2023-08-24: 0.9 mg via INTRAVENOUS
  Administered 2023-08-24: 8 mg via INTRAVENOUS
  Administered 2023-08-24 (×2): 7 mg via INTRAVENOUS
  Administered 2023-08-24: 14 mg via INTRAVENOUS
  Administered 2023-08-25: 4.5 mg via INTRAVENOUS
  Administered 2023-08-25: 12.5 mg via INTRAVENOUS
  Administered 2023-08-25: 4 mg via INTRAVENOUS
  Administered 2023-08-25: 14.5 mg via INTRAVENOUS
  Administered 2023-08-25: 3 mg via INTRAVENOUS
  Administered 2023-08-26: 30 mg via INTRAVENOUS
  Administered 2023-08-26: 1.5 mg via INTRAVENOUS
  Administered 2023-08-26: 7.5 mg via INTRAVENOUS
  Administered 2023-08-26: 0.5 mg via INTRAVENOUS
  Administered 2023-08-26: 12 mg via INTRAVENOUS
  Administered 2023-08-26: 4.5 mg via INTRAVENOUS
  Administered 2023-08-27: 8.5 mg via INTRAVENOUS
  Administered 2023-08-27: 30 mg via INTRAVENOUS
  Administered 2023-08-27: 3.5 mg via INTRAVENOUS
  Administered 2023-08-27 (×2): 9.5 mg via INTRAVENOUS
  Administered 2023-08-27: 30 mg via INTRAVENOUS
  Administered 2023-08-27: 10.5 mg via INTRAVENOUS
  Administered 2023-08-28: 2 mg via INTRAVENOUS
  Administered 2023-08-28: 6 mg via INTRAVENOUS
  Administered 2023-08-28: 5 mg via INTRAVENOUS
  Administered 2023-08-28: 11.5 mg via INTRAVENOUS
  Administered 2023-08-28: 6 mg via INTRAVENOUS
  Administered 2023-08-28: 13.5 mg via INTRAVENOUS
  Administered 2023-08-28: 5 mg via INTRAVENOUS
  Administered 2023-08-28 – 2023-08-29 (×2): 30 mg via INTRAVENOUS
  Administered 2023-08-29: 11.7 mg via INTRAVENOUS
  Administered 2023-08-29: 5 mg via INTRAVENOUS
  Filled 2023-08-21 (×12): qty 30

## 2023-08-21 MED ORDER — ONDANSETRON HCL 4 MG/2ML IJ SOLN
4.0000 mg | Freq: Four times a day (QID) | INTRAMUSCULAR | Status: DC | PRN
  Administered 2023-08-22 – 2023-08-28 (×3): 4 mg via INTRAVENOUS
  Filled 2023-08-21 (×3): qty 2

## 2023-08-21 MED ORDER — KETOROLAC TROMETHAMINE 15 MG/ML IJ SOLN
15.0000 mg | INTRAMUSCULAR | Status: AC
  Administered 2023-08-21: 15 mg via INTRAVENOUS
  Filled 2023-08-21: qty 1

## 2023-08-21 MED ORDER — SODIUM CHLORIDE 0.9% FLUSH: 9.0000 mL | INTRAVENOUS | Status: DC | PRN

## 2023-08-21 MED ORDER — SODIUM CHLORIDE 0.45 % IV SOLN: INTRAVENOUS | Status: AC

## 2023-08-21 MED ORDER — OXYCODONE HCL 5 MG PO TABS
10.0000 mg | ORAL_TABLET | Freq: Four times a day (QID) | ORAL | Status: DC | PRN
  Administered 2023-08-21: 10 mg via ORAL
  Filled 2023-08-21: qty 2

## 2023-08-21 MED ORDER — SUCRALFATE 1 G PO TABS
1.0000 g | ORAL_TABLET | Freq: Two times a day (BID) | ORAL | Status: DC
  Administered 2023-08-21 – 2023-08-29 (×16): 1 g via ORAL
  Filled 2023-08-21 (×16): qty 1

## 2023-08-21 MED ORDER — PANTOPRAZOLE SODIUM 40 MG PO TBEC: 40.0000 mg | DELAYED_RELEASE_TABLET | Freq: Every day | ORAL | Status: DC

## 2023-08-21 MED ORDER — DIPHENHYDRAMINE HCL 25 MG PO CAPS
25.0000 mg | ORAL_CAPSULE | ORAL | Status: DC | PRN
  Administered 2023-08-21 – 2023-08-26 (×7): 25 mg via ORAL
  Filled 2023-08-21 (×7): qty 1

## 2023-08-21 MED ORDER — SODIUM CHLORIDE 0.9 % IV SOLN: 12.5000 mg | Freq: Once | INTRAVENOUS | Status: DC

## 2023-08-21 MED ORDER — POLYETHYLENE GLYCOL 3350 17 G PO PACK: 17.0000 g | PACK | Freq: Every day | ORAL | Status: DC | PRN

## 2023-08-21 MED ORDER — OXYCODONE HCL 5 MG PO TABS
10.0000 mg | ORAL_TABLET | Freq: Four times a day (QID) | ORAL | Status: DC | PRN
  Administered 2023-08-21 – 2023-08-29 (×20): 10 mg via ORAL
  Filled 2023-08-21 (×22): qty 2

## 2023-08-21 MED ORDER — DIPHENHYDRAMINE HCL 50 MG/ML IJ SOLN
25.0000 mg | Freq: Once | INTRAMUSCULAR | Status: AC
  Administered 2023-08-21: 25 mg via INTRAVENOUS

## 2023-08-21 MED ORDER — DIPHENHYDRAMINE HCL 50 MG/ML IJ SOLN
25.0000 mg | Freq: Once | INTRAMUSCULAR | Status: AC
  Administered 2023-08-21: 25 mg via INTRAVENOUS
  Filled 2023-08-21: qty 1

## 2023-08-21 MED ORDER — OXYCODONE HCL ER 10 MG PO T12A: 10.0000 mg | EXTENDED_RELEASE_TABLET | Freq: Two times a day (BID) | ORAL | Status: DC

## 2023-08-21 MED ORDER — MOMETASONE FURO-FORMOTEROL FUM 100-5 MCG/ACT IN AERO
2.0000 | INHALATION_SPRAY | Freq: Two times a day (BID) | RESPIRATORY_TRACT | Status: DC
  Filled 2023-08-21: qty 8.8

## 2023-08-21 MED ORDER — HYDROMORPHONE HCL 1 MG/ML IJ SOLN
2.0000 mg | Freq: Once | INTRAMUSCULAR | Status: AC
  Administered 2023-08-21: 2 mg via INTRAVENOUS
  Filled 2023-08-21: qty 2

## 2023-08-21 MED ORDER — LUBIPROSTONE 8 MCG PO CAPS
8.0000 ug | ORAL_CAPSULE | Freq: Two times a day (BID) | ORAL | Status: DC
  Administered 2023-08-22 – 2023-08-29 (×14): 8 ug via ORAL
  Filled 2023-08-21 (×15): qty 1

## 2023-08-21 MED ORDER — HYDROXYUREA 500 MG PO CAPS
500.0000 mg | ORAL_CAPSULE | Freq: Three times a day (TID) | ORAL | Status: DC
Start: 2023-08-21 — End: 2023-08-21
  Filled 2023-08-21 (×2): qty 1

## 2023-08-21 MED ORDER — ONDANSETRON HCL 4 MG/2ML IJ SOLN
4.0000 mg | INTRAMUSCULAR | Status: DC | PRN
  Administered 2023-08-21: 4 mg via INTRAVENOUS
  Filled 2023-08-21: qty 2

## 2023-08-21 MED ORDER — PREGABALIN 75 MG PO CAPS
75.0000 mg | ORAL_CAPSULE | Freq: Three times a day (TID) | ORAL | Status: DC
  Administered 2023-08-21 – 2023-08-29 (×23): 75 mg via ORAL
  Filled 2023-08-21 (×23): qty 1

## 2023-08-21 MED ORDER — PRAZOSIN HCL 1 MG PO CAPS: 1.0000 mg | ORAL_CAPSULE | Freq: Every evening | ORAL | Status: DC | PRN

## 2023-08-21 MED ORDER — KETOROLAC TROMETHAMINE 15 MG/ML IJ SOLN
15.0000 mg | Freq: Four times a day (QID) | INTRAMUSCULAR | Status: AC
  Administered 2023-08-21 – 2023-08-26 (×20): 15 mg via INTRAVENOUS
  Filled 2023-08-21 (×20): qty 1

## 2023-08-21 MED ORDER — HYDROXYUREA 500 MG PO CAPS
500.0000 mg | ORAL_CAPSULE | Freq: Every day | ORAL | Status: DC
  Administered 2023-08-21 – 2023-08-29 (×9): 500 mg via ORAL
  Filled 2023-08-21 (×9): qty 1

## 2023-08-21 MED ORDER — NALOXONE HCL 0.4 MG/ML IJ SOLN: 0.4000 mg | INTRAMUSCULAR | Status: DC | PRN

## 2023-08-21 MED ORDER — SENNOSIDES-DOCUSATE SODIUM 8.6-50 MG PO TABS
1.0000 | ORAL_TABLET | Freq: Two times a day (BID) | ORAL | Status: DC
  Administered 2023-08-21 – 2023-08-29 (×15): 1 via ORAL
  Filled 2023-08-21 (×15): qty 1

## 2023-08-21 NOTE — ED Notes (Signed)
 Attempted to call nursing report to [facility]  6E

## 2023-08-21 NOTE — H&P (Signed)
 H&P  Patient Demographics:  Bailey Reese, is a 18 y.o. female  MRN: [id number]   DOB - 06/21/2005  Admit Date - 08/21/2023  Outpatient Primary MD for the patient is Little, Neita Carp, MD  No chief complaint on file.     HPI:   Bailey Reese  is a 18 y.o. female with medical history significant of sickle cell disease, PTSD, depression with anxiety who usually gets her care at [facility] but was seen at [facility] for the last 24 hours where she stayed in the ER with sickle cell pain crisis.  Patient was managed in the ER for crisis however her pain was not controlled. Patient was transferred to our care for continued sickle cell crisis pain control. Patient reported to have pain similar to her typical sickle cell crisis. Is mainly in her legs and back.  She has vitals that are stable.  Hemoglobin is around 7.4 slightly lower than her base line.  She was getting IV Dilaudid pushes in the ER.  Patient denied any nausea vomiting or diarrhea.  Denied any viral illness.  She has had previous osteomyelitis and multiple episodes of acute chest syndromes.  Also hemorrhagic stroke in 2017.  Patient has chronic pain from her sickle cell disease.  She has been admitted for sickle cell pain management.     Review of systems:  In addition to the HPI above, patient reports No fever or chills No Headache, No changes with vision or hearing No problems swallowing food or liquids No chest pain, cough or shortness of breath No abdominal pain, No nausea or vomiting, Bowel movements are regular No blood in stool or urine No dysuria No new skin rashes or bruises No new weakness, tingling, numbness in any extremity No recent weight gain or loss No polyuria, polydypsia or polyphagia No significant Mental Stressors  A full 10 point Review of Systems was done, except as stated above, all other Review of Systems were negative.  With Past History of the following :   Past Medical History:   Diagnosis Date   Anxiety    Depression    PTSD (post-traumatic stress disorder)    Sickle cell anemia (HCC)       Past Surgical History:  Procedure Laterality Date   BIOPSY  07/04/2023   Procedure: BIOPSY;  Surgeon: Kerin Salen, MD;  Location: [facility];  Service: Gastroenterology;;   CHOLECYSTECTOMY     ESOPHAGOGASTRODUODENOSCOPY (EGD) WITH PROPOFOL N/A 07/04/2023   Procedure: ESOPHAGOGASTRODUODENOSCOPY (EGD) WITH PROPOFOL;  Surgeon: Kerin Salen, MD;  Location: [facility];  Service: Gastroenterology;  Laterality: N/A;   WRIST SURGERY       Social History:   Social History   Tobacco Use   Smoking status: Never   Smokeless tobacco: Never  Substance Use Topics   Alcohol use: No     Lives - At home   Family History :   Family History  Problem Relation Age of Onset   Diabetes Paternal Aunt      Home Medications:   Prior to Admission medications   Medication Sig Start Date End Date Taking? Authorizing Provider  budesonide-formoterol (SYMBICORT) 80-4.5 MCG/ACT inhaler Inhale 2 puffs into the lungs 2 (two) times daily as needed (wheezing, shortness of breath). 12/29/22 12/29/23 Yes [provider]  buprenorphine (BUTRANS) 10 MCG/HR PTWK Place 1 patch onto the skin once a week.   Yes [provider]  Calcium Carb-Cholecalciferol (CALCIUM 600+D3 PO) Take 1 tablet by mouth daily.  Yes [provider]  Deferasirox 360 MG TABS Take 360 mg by mouth 2 (two) times daily.   Yes [provider]  ergocalciferol (VITAMIN D2) 1.25 MG (50000 UT) capsule Take 50,000 Units by mouth every Saturday.   Yes [provider]  fexofenadine (ALLEGRA) 180 MG tablet Take 180 mg by mouth daily as needed for allergies. 12/29/22  Yes [provider]  hydroxyurea (HYDREA) 500 MG capsule Take 500 mg by mouth in the morning, at noon, and at bedtime. 06/22/23 09/20/23 Yes [provider]  hydrOXYzine (ATARAX) 25 MG tablet Take 25 mg by mouth 2  (two) times daily.   Yes [provider]  ondansetron (ZOFRAN-ODT) 4 MG disintegrating tablet Take 4 mg by mouth every 8 (eight) hours as needed for nausea or vomiting (dissolve orally).   Yes [provider]  Oxycodone HCl 10 MG TABS Take 1 tablet (10 mg total) by mouth every 6 (six) hours as needed (pain). Patient taking differently: Take 10 mg by mouth 3 (three) times daily. 07/05/23  Yes Rometta Emery, MD  pregabalin (LYRICA) 150 MG capsule Take 150 mg by mouth 3 (three) times daily.   Yes [provider]  esomeprazole (NEXIUM) 40 MG capsule Take 40 mg by mouth daily as needed (reflux).    [provider]  fluticasone (FLONASE) 50 MCG/ACT nasal spray Place 1 spray into both nostrils daily as needed. 12/29/22   [provider]  lubiprostone (AMITIZA) 8 MCG capsule Take 8 mcg by mouth 2 (two) times daily with a meal. 06/01/23 05/31/24  [provider]  oxyCODONE (OXYCONTIN) 10 mg 12 hr tablet Take 1 tablet (10 mg total) by mouth every 12 (twelve) hours. Patient not taking: Reported on 08/21/2023 07/05/23   Rometta Emery, MD  pantoprazole (PROTONIX) 40 MG tablet Take 1 tablet (40 mg total) by mouth 2 (two) times daily. 07/05/23 07/04/24  Rometta Emery, MD  prazosin (MINIPRESS) 1 MG capsule Take 1 mg by mouth at bedtime.    [provider]  promethazine (PHENERGAN) 12.5 MG tablet Take 12.5 mg by mouth every 6 (six) hours as needed for nausea or vomiting. 05/10/23   [provider]  sucralfate (CARAFATE) 1 g tablet Take 1 tablet (1 g total) by mouth 2 (two) times daily. 07/05/23   Rometta Emery, MD     Allergies:   Allergies  Allergen Reactions   Olanzapine Other (See Comments)    "Drug-induced liver injury"   Other Swelling and Other (See Comments)    "Pitted fruits"   Prunus Persica Swelling and Other (See Comments)    Lips swell    Cherry Itching and Other (See Comments)    Lips itch   Cherry Itching, Swelling  and Other (See Comments)    Mouth and throat get itchy & lips swell; throat tightens   Ibuprofen Nausea And Vomiting   Morphine And Codeine Hives and Itching   Other Itching, Swelling and Other (See Comments)    PITTED FRUITS = Mouth and throat get itchy & lips swell; throat tightens    Peach [Prunus Persica] Itching, Swelling and Other (See Comments)    Mouth and throat get itchy & lips swell; throat tightens   Peanut Butter Flavoring Agent (Non-Screening) Itching    Per pt "mouth gets itchy"   Peanut-Containing Drug Products Itching, Swelling and Other (See Comments)    Mouth and throat get itchy & lips swell; throat tightens; chest tightens   Plum Pulp Itching,  Swelling and Other (See Comments)    Mouth and throat get itchy & lips swell; throat tightens   Tylenol [Acetaminophen] Nausea And Vomiting     Physical Exam:   Vitals:   Vitals:   08/22/23 0607 08/22/23 0733  BP: 124/69 118/63  Pulse: (!) 124 (!) 119  Resp: 18 18  Temp: (!) 100.4 F (38 C) 99.4 F (37.4 C)  SpO2: 98% 98%    Physical Exam: Constitutional: Patient appears well-developed and well-nourished. Not in obvious distress. HENT: Normocephalic, atraumatic, External right and left ear normal. Oropharynx is clear and moist.  Eyes: Conjunctivae and EOM are normal. PERRLA, no scleral icterus. Neck: Normal ROM. Neck supple. No JVD. No tracheal deviation. No thyromegaly. CVS: RRR, S1/S2 +, no murmurs, no gallops, no carotid bruit.  Pulmonary: Effort and breath sounds normal, no stridor, rhonchi, wheezes, rales.  Abdominal: Soft. BS +, no distension, tenderness, rebound or guarding.  Musculoskeletal: Bilateral Leg pain and back pain  Lymphadenopathy: No lymphadenopathy noted, cervical, inguinal or axillary Neuro: Alert. Normal reflexes, muscle tone coordination. No cranial nerve deficit. Skin: Skin is warm and dry. No rash noted. Not diaphoretic. No erythema. No pallor. Psychiatric: Normal mood and affect.  Behavior, judgment, thought content normal.   Data Review:   CBC Recent Labs  Lab 08/21/23 0835  WBC 15.5*  HGB 7.8*  HCT 21.2*  PLT 238  MCV 100.5*  MCH 37.0*  MCHC 36.8*  RDW 19.1*  LYMPHSABS 3.5  MONOABS 2.2*  EOSABS 0.5  BASOSABS 0.1   ------------------------------------------------------------------------------------------------------------------  Chemistries  Recent Labs  Lab 08/21/23 0835  NA 139  K 3.4*  CL 108  CO2 22  GLUCOSE 108*  BUN 12  CREATININE 0.49  CALCIUM 9.2  AST 30  ALT 31  ALKPHOS 134*  BILITOT 4.5*   ------------------------------------------------------------------------------------------------------------------ estimated creatinine clearance is 105.5 mL/min (by C-G formula based on SCr of 0.49 mg/dL). ------------------------------------------------------------------------------------------------------------------ No results for input(s): "TSH", "T4TOTAL", "T3FREE", "THYROIDAB" in the last 72 hours.  Invalid input(s): "FREET3"  Coagulation profile No results for input(s): "INR", "PROTIME" in the last 168 hours. ------------------------------------------------------------------------------------------------------------------- No results for input(s): "DDIMER" in the last 72 hours. -------------------------------------------------------------------------------------------------------------------  Cardiac Enzymes No results for input(s): "CKMB", "TROPONINI", "MYOGLOBIN" in the last 168 hours.  Invalid input(s): "CK" ------------------------------------------------------------------------------------------------------------------ No results found for: "BNP"  ---------------------------------------------------------------------------------------------------------------  Urinalysis    Component Value Date/Time   COLORURINE YELLOW (A) 07/02/2023 1758   APPEARANCEUR CLEAR (A) 07/02/2023 1758   APPEARANCEUR Clear 05/01/2013 1440    LABSPEC 1.015 07/02/2023 1758   LABSPEC 1.016 05/01/2013 1440   PHURINE 7.0 07/02/2023 1758   GLUCOSEU NEGATIVE 07/02/2023 1758   GLUCOSEU Negative 05/01/2013 1440   HGBUR NEGATIVE 07/02/2023 1758   BILIRUBINUR NEGATIVE 07/02/2023 1758   BILIRUBINUR Negative 05/01/2013 1440   KETONESUR NEGATIVE 07/02/2023 1758   PROTEINUR NEGATIVE 07/02/2023 1758   NITRITE NEGATIVE 07/02/2023 1758   LEUKOCYTESUR NEGATIVE 07/02/2023 1758   LEUKOCYTESUR Negative 05/01/2013 1440    ----------------------------------------------------------------------------------------------------------------   Imaging Results:    DG Chest 2 View Result Date: 08/21/2023 CLINICAL DATA:  chest pain, sickle cell pain crisis, eval for unlikely acute chest EXAM: CHEST - 2 VIEW COMPARISON:  05/31/2023. FINDINGS: Cardiac silhouette is unremarkable. No pneumothorax or pleural effusion. The lungs are clear. Mild thoracic sickle cell related endplate changes. No acute osseous abnormalities. T12 mild endplate changes may be sickle cell-related. IMPRESSION: No acute cardiopulmonary process. Electronically Signed   By: Layla Maw M.D.   On: 08/21/2023 13:21  Assessment & Plan:  Principal Problem:   Sickle cell anemia with pain (HCC) Active Problems:   Chronic pain syndrome   Leucocytosis   Hb Sickle Cell Disease with crisis: Admit patient, start IVF 0.45% Saline @ 125 mls/hour, start weight based Dilaudid PCA, start IV Toradol 15 mg Q 6 H, Restart oral home pain medications, Monitor vitals very closely, Re-evaluate pain scale regularly, 2 L of Oxygen by [place], Patient will be re-evaluated for pain in the context of function and relationship to baseline as care progresses. Leukocytosis: Elevated no acute s/s of infection . Will continue to monitor. Daily CBC Anemia of Chronic Disease: HGB slightly decreased, no need for transfusion, will continue to monitor closely. Chronic pain Syndrome: Continue home medication   DVT  Prophylaxis: Subcut Lovenox   AM Labs Ordered, also please review Full Orders  Family Communication: Admission, patient's condition and plan of care including tests being ordered have been discussed with the patient who indicate understanding and agree with the plan and Code Status.  Code Status: Full Code  Consults called: None    Admission status: Inpatient    Time spent in minutes : 50 minutes  Daryll Drown NP 08/22/2023 at 11:42 AM

## 2023-08-21 NOTE — ED Triage Notes (Signed)
 See first nurse note.

## 2023-08-21 NOTE — ED Provider Notes (Addendum)
 [facility] Provider Note    Event Date/Time   First MD Initiated Contact with Patient 08/21/23 1050     (approximate)   History   Sickle Cell Pain Crisis Arrived by EMS From home with c/o generalized pain all over. Sickle cell patient. Reports pain worsened today. Took her oxy at 4am.   EMS vitals: 154/78 b/p 128HR 96% RA but requested oxygen for comfort. 99% 4L O2 - wears oxygen at night as needed   See first nurse note   HPI  Bailey Reese is a 18 y.o. female PMH sickle anemia, PTSD, anxiety pain syndrome, presents for evaluation of reported sickle cell pain -Per chart review, last admitted to outside hospital in March 2025 for sickle cell pain crisis.  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 -Today, patient states she has had for typical she has had her typical sickle cell pain (full body including chest).  No shortness of breath.  No recent infectious symptoms. -Last took oxycodone 10 mg at 4 AM -No headache or focal weakness     Physical Exam   Triage Vital Signs: ED Triage Vitals  Encounter Vitals Group     BP 08/21/23 0816 (!) 141/80     Systolic BP Percentile --      Diastolic BP Percentile --      Pulse Rate 08/21/23 0816 (!) 120     Resp 08/21/23 0816 20     Temp 08/21/23 0816 98.4 F (36.9 C)     Temp Source 08/21/23 0816 Oral     SpO2 08/21/23 0816 100 %     Weight 08/21/23 0817 145 lb (65.8 kg)     Height 08/21/23 0817 5\' 4"  (1.626 m)     Head Circumference --      Peak Flow --      Pain Score 08/21/23 0814 10     Pain Loc --      Pain Education --      Exclude from Growth Chart --     Most recent vital signs: Vitals:   08/21/23 1351 08/21/23 1500  BP: 110/62 110/63  Pulse: (!) 101 98  Resp: 15 13  Temp:    SpO2: 98% 100%     General: Awake, no distress.  CV:  Good peripheral perfusion.  No tachycardia, regular rate and rhythm, no murmurs appreciated.  RP 2+. Resp:  Normal effort.   CTAB. Abd:  No distention.  No tenderness to the palpation. Neuro:  No focal motor deficits appreciated   ED Results / Procedures / Treatments   Labs (all labs ordered are listed, but only abnormal results are displayed) Labs Reviewed  COMPREHENSIVE METABOLIC PANEL WITH GFR - Abnormal; Notable for the following components:      Result Value   Potassium 3.4 (*)    Glucose, Bld 108 (*)    Alkaline Phosphatase 134 (*)    Total Bilirubin 4.5 (*)    All other components within normal limits  CBC WITH DIFFERENTIAL/PLATELET - Abnormal; Notable for the following components:   WBC 15.5 (*)    RBC 2.11 (*)    Hemoglobin 7.8 (*)    HCT 21.2 (*)    MCV 100.5 (*)    MCH 37.0 (*)    MCHC 36.8 (*)    RDW 19.1 (*)    nRBC 3.0 (*)    Neutro Abs 8.8 (*)    Monocytes Absolute 2.2 (*)    Abs Immature Granulocytes 0.44 (*)  All other components within normal limits  RETICULOCYTES - Abnormal; Notable for the following components:   Retic Ct Pct 16.1 (*)    RBC. 1.95 (*)    Retic Count, Absolute 314.0 (*)    All other components within normal limits  HCG, QUANTITATIVE, PREGNANCY  POC URINE PREG, ED     EKG  See ED course below   RADIOLOGY See ED course below    PROCEDURES:  Critical Care performed: No  Procedures   MEDICATIONS ORDERED IN ED: Medications  ondansetron (ZOFRAN) injection 4 mg (4 mg Intravenous Given 08/21/23 1153)  ketorolac (TORADOL) 15 MG/ML injection 15 mg (15 mg Intravenous Given 08/21/23 1156)  HYDROmorphone (DILAUDID) injection 2 mg (2 mg Intravenous Given 08/21/23 1151)  diphenhydrAMINE (BENADRYL) injection 25 mg (25 mg Intravenous Given 08/21/23 1150)  HYDROmorphone (DILAUDID) injection 2 mg (2 mg Intravenous Given 08/21/23 1347)  HYDROmorphone (DILAUDID) injection 2 mg (2 mg Intravenous Given 08/21/23 1456)  diphenhydrAMINE (BENADRYL) injection 25 mg (25 mg Intravenous Given 08/21/23 1457)     IMPRESSION / MDM / ASSESSMENT AND PLAN / ED COURSE  I reviewed  the triage vital signs and the nursing notes.                              Differential diagnosis includes, but is not limited to, sickle cell pain crisis, very well-appearing but does complain of some chest discomfort so consider possibility of acute chest syndrome.  No evidence of stroke or other complications of sickle cell disease at this time.  Doubt aplastic crisis.  Plan: -Labs -Pain control -Chest x-ray -Reassess  Patient's presentation is most consistent with acute presentation with potential threat to life or bodily function. The patient is on the cardiac monitor to evaluate for evidence of arrhythmia and/or significant heart rate changes.  ED course below.  Fortunately no evidence of any complications today.  Patient received 3 rounds of pain medications including Dilaudid x 3, Toradol, Benadryl x 2, Zofran.  Still notes persistent significant pain and does not feel pain is improved enough to return home.  Signed out to oncoming ED provider (Dr. Fuller Plan) pending tentative transfer, awaiting callback from outside hospital.   Clinical Course as of 08/21/23 1612  Mon Aug 21, 2023  1105 CBC with leukocytosis, mildly worsened anemia from prior  BMP reviewed, unremarkable [MM]  1105 Reticulocyte count 16.1%, greater than 0.5%, not consistent with aplastic crisis [MM]  1318 Evaluated, pain still significant, will give second round of pain medication [MM]  1318 X-ray reviewed, unremarkable on my interpretation, not consistent with acute chest syndrome [MM]  1337 Chest x-ray radiology report reviewed, unremarkable [MM]  1352 Chest x-ray radiology report reviewed, unremarkable [MM]  1417 Patient reevaluated, pain 9/10, also noting itchiness.  Will give further round of Dilaudid and Benadryl and plan for disposition. [MM]  1509 Patient reevaluated, still in significant pain, does not feel well enough to go home.  She is amenable to transfer to other hospital that has specific resources for  sickle cell pain management.  ED clerk paging [facility] to discuss possible transfer [MM]  1607 D/w [facility] (NP Nita Sells) - accepts for transfer [MM]    Clinical Course User Index [MM] Marinell Blight, MD     FINAL CLINICAL IMPRESSION(S) / ED DIAGNOSES   Final diagnoses:  Sickle cell pain crisis [facility])     Rx / DC Orders   ED Discharge Orders  None        Note:  This document was prepared using Dragon voice recognition software and may include unintentional dictation errors.   Marinell Blight, MD 08/21/23 1537    Marinell Blight, MD 08/21/23 1537

## 2023-08-21 NOTE — ED Notes (Signed)
 [facility]  called per  DR Melynda Keller  MD

## 2023-08-21 NOTE — ED Triage Notes (Signed)
 Arrived by EMS From home with c/o generalized pain all over. Sickle cell patient. Reports pain worsened today. Took her oxy at 4am.   EMS vitals: 154/78 b/p 128HR 96% RA but requested oxygen for comfort. 99% 4L O2 - wears oxygen at night as needed

## 2023-08-21 NOTE — Progress Notes (Signed)
 Pt admitted to the unit. A&ox4 and VSS. 10/10 pain and pending orders.

## 2023-08-21 NOTE — ED Notes (Signed)
IV team to bedside. 

## 2023-08-22 ENCOUNTER — Other Ambulatory Visit: Payer: Self-pay | Admitting: Nurse Practitioner

## 2023-08-22 ENCOUNTER — Encounter (HOSPITAL_COMMUNITY): Payer: Self-pay | Admitting: Internal Medicine

## 2023-08-22 LAB — CBC WITH DIFFERENTIAL/PLATELET
Abs Immature Granulocytes: 0.2 10*3/uL — ABNORMAL HIGH (ref 0.00–0.07)
Basophils Absolute: 0.1 10*3/uL (ref 0.0–0.1)
Basophils Relative: 0 %
Eosinophils Absolute: 0.2 10*3/uL (ref 0.0–0.5)
Eosinophils Relative: 1 %
HCT: 19 % — ABNORMAL LOW (ref 36.0–46.0)
Hemoglobin: 6.7 g/dL — CL (ref 12.0–15.0)
Immature Granulocytes: 1 %
Lymphocytes Relative: 15 %
Lymphs Abs: 2.5 10*3/uL (ref 0.7–4.0)
MCH: 37.4 pg — ABNORMAL HIGH (ref 26.0–34.0)
MCHC: 35.3 g/dL (ref 30.0–36.0)
MCV: 106.1 fL — ABNORMAL HIGH (ref 80.0–100.0)
Monocytes Absolute: 3.4 10*3/uL — ABNORMAL HIGH (ref 0.1–1.0)
Monocytes Relative: 20 %
Neutro Abs: 10.7 10*3/uL — ABNORMAL HIGH (ref 1.7–7.7)
Neutrophils Relative %: 63 %
Platelets: 228 10*3/uL (ref 150–400)
RBC: 1.79 MIL/uL — ABNORMAL LOW (ref 3.87–5.11)
RDW: 20.5 % — ABNORMAL HIGH (ref 11.5–15.5)
WBC: 17.1 10*3/uL — ABNORMAL HIGH (ref 4.0–10.5)
nRBC: 7 % — ABNORMAL HIGH (ref 0.0–0.2)

## 2023-08-22 LAB — CBC
HCT: 21.3 % — ABNORMAL LOW (ref 36.0–46.0)
Hemoglobin: 7.4 g/dL — ABNORMAL LOW (ref 12.0–15.0)
MCH: 37.6 pg — ABNORMAL HIGH (ref 26.0–34.0)
MCHC: 34.7 g/dL (ref 30.0–36.0)
MCV: 108.1 fL — ABNORMAL HIGH (ref 80.0–100.0)
Platelets: 224 10*3/uL (ref 150–400)
RBC: 1.97 MIL/uL — ABNORMAL LOW (ref 3.87–5.11)
RDW: 21.1 % — ABNORMAL HIGH (ref 11.5–15.5)
WBC: 18.9 10*3/uL — ABNORMAL HIGH (ref 4.0–10.5)
nRBC: 7.6 % — ABNORMAL HIGH (ref 0.0–0.2)

## 2023-08-22 LAB — PREPARE RBC (CROSSMATCH)

## 2023-08-22 MED ORDER — ACETAMINOPHEN 500 MG PO TABS
500.0000 mg | ORAL_TABLET | Freq: Four times a day (QID) | ORAL | Status: DC | PRN
  Administered 2023-08-22 – 2023-08-24 (×5): 500 mg via ORAL
  Filled 2023-08-22 (×5): qty 1

## 2023-08-22 MED ORDER — VANCOMYCIN HCL IN DEXTROSE 1-5 GM/200ML-% IV SOLN
1000.0000 mg | Freq: Two times a day (BID) | INTRAVENOUS | Status: AC
  Administered 2023-08-23 – 2023-08-27 (×10): 1000 mg via INTRAVENOUS
  Filled 2023-08-22 (×10): qty 200

## 2023-08-22 MED ORDER — ORAL CARE MOUTH RINSE: 15.0000 mL | OROMUCOSAL | Status: DC | PRN

## 2023-08-22 MED ORDER — SODIUM CHLORIDE 0.9% IV SOLUTION: Freq: Once | INTRAVENOUS | Status: AC

## 2023-08-22 MED ORDER — SODIUM CHLORIDE 0.9 % IV SOLN
2.0000 g | Freq: Three times a day (TID) | INTRAVENOUS | Status: DC
  Administered 2023-08-22 – 2023-08-29 (×20): 2 g via INTRAVENOUS
  Filled 2023-08-22 (×21): qty 12.5

## 2023-08-22 MED ORDER — VANCOMYCIN HCL 1500 MG/300ML IV SOLN
1500.0000 mg | Freq: Once | INTRAVENOUS | Status: AC
  Administered 2023-08-22: 1500 mg via INTRAVENOUS
  Filled 2023-08-22: qty 300

## 2023-08-22 NOTE — Progress Notes (Signed)
   08/22/23 2248  Assess: MEWS Score  Temp 99.9 F (37.7 C)  BP 131/77  MAP (mmHg) 94  Pulse Rate (!) 130  Resp 16  Level of Consciousness Alert  Assess: MEWS Score  MEWS Temp 0  MEWS Systolic 0  MEWS Pulse 3  MEWS RR 0  MEWS LOC 0  MEWS Score 3  MEWS Score Color Yellow  Assess: if the MEWS score is Yellow or Red  Were vital signs accurate and taken at a resting state? Yes  Does the patient meet 2 or more of the SIRS criteria? Yes  Does the patient have a confirmed or suspected source of infection? No  MEWS guidelines implemented  Yes, yellow  Treat  MEWS Interventions Considered administering scheduled or prn medications/treatments as ordered  Take Vital Signs  Increase Vital Sign Frequency  Yellow: Q2hr x1, continue Q4hrs until patient remains green for 12hrs  Escalate  MEWS: Escalate Yellow: Discuss with charge nurse and consider notifying provider and/or RRT  Notify: Charge Nurse/RN  Name of Charge Nurse/RN Notified stephanie  Assess: SIRS CRITERIA  SIRS Temperature  0  SIRS Respirations  0  SIRS Pulse 1  SIRS WBC 1  SIRS Score Sum  2   Pt currently receiving blood

## 2023-08-22 NOTE — Progress Notes (Signed)
   08/22/23 1825  Provider Notification  Provider Name/Title Troy Sine, NP  Date Provider Notified 08/22/23  Time Provider Notified 1822  Method of Notification Page  Notification Reason Critical Result  Test performed and critical result CBC  Date Critical Result Received 08/22/23  Time Critical Result Received 1822  Provider response Other (Comment) (Defer to Night Coverage)  Date of Provider Response 08/22/23  Time of Provider Response 1830

## 2023-08-22 NOTE — Progress Notes (Signed)
 Patient developed fever over 100.4, HR 124, with elevated WBC of over 14. HR 18 within patients limit. Patient started on Empirical antibiotics, Vancomycin and cefepime,  Blood cultures, urinalysis with reflex to culture ordered, Red MEWS protocol in place. Will continue to monitor patient closely and reassess in the AM.

## 2023-08-22 NOTE — Progress Notes (Signed)
 Pharmacy Antibiotic Note  Bailey Reese is a 18 y.o. female admitted on 08/21/2023 with  fever of unknown origin, leukocytosis, and elevated HR--meeting SIRS criteria . Pharmacy has been consulted for vancomycin and cefepime dosing.  Plan: Start cefepime 2g IV q8h Give vancomycin 1500mg  IV x1 now, followed by vanc 1000mg  IV q12h (eAUC 493 using Scr rounded to 0.8, IBW, and Vd 0.72) Vancomycin levels PRN Monitor cultures, renal function, and overall clinical picture De-escalate antibiotics as able     Temp (24hrs), Avg:99.8 F (37.7 C), Min:98 F (36.7 C), Max:101.7 F (38.7 C)  Recent Labs  Lab 08/21/23 0835  WBC 15.5*  CREATININE 0.49    Estimated Creatinine Clearance: 105.5 mL/min (by C-G formula based on SCr of 0.49 mg/dL).    Allergies  Allergen Reactions   Cherry Anaphylaxis, Itching and Swelling   Olanzapine Other (See Comments)    "Drug-induced liver injury"   Other Swelling and Other (See Comments)    "Pitted fruits"   Peanut-Containing Drug Products Anaphylaxis, Itching and Swelling   Plum Pulp Anaphylaxis and Hives   Ibuprofen Nausea And Vomiting   Morphine And Codeine Hives and Itching   Peach [Prunus Persica] Itching, Swelling and Other (See Comments)   Tylenol [Acetaminophen] Nausea And Vomiting    Antimicrobials this admission: 4/8 cefepime >>  4/8 vancomycin >>   Dose adjustments this admission: N/A  Microbiology results: 4/8 BCx: not yet collected   Thank you for allowing pharmacy to be a part of this patient's care.  Cherylin Mylar, PharmD Clinical Pharmacist  4/8/20254:02 PM

## 2023-08-22 NOTE — Progress Notes (Signed)
   08/22/23 0607  Assess: MEWS Score  Temp (!) 100.4 F (38 C)  BP 124/69  MAP (mmHg) 86  Pulse Rate (!) 124  Resp 18  Level of Consciousness Alert  SpO2 98 %  O2 Device Nasal Cannula  O2 Flow Rate (L/min) 1 L/min  Assess: MEWS Score  MEWS Temp 0  MEWS Systolic 0  MEWS Pulse 2  MEWS RR 0  MEWS LOC 0  MEWS Score 2  MEWS Score Color Yellow  Assess: if the MEWS score is Yellow or Red  Were vital signs accurate and taken at a resting state? Yes  Does the patient meet 2 or more of the SIRS criteria? No  MEWS guidelines implemented  Yes, yellow  Treat  MEWS Interventions Considered administering scheduled or prn medications/treatments as ordered  Take Vital Signs  Increase Vital Sign Frequency  Yellow: Q2hr x1, continue Q4hrs until patient remains green for 12hrs  Escalate  MEWS: Escalate Yellow: Discuss with charge nurse and consider notifying provider and/or RRT  Notify: Charge Nurse/RN  Name of Charge Nurse/RN Notified Renita, RN  Provider Notification  Provider Name/Title J. Garner Nash, NP  Date Provider Notified 08/22/23  Time Provider Notified [phone]  Method of Notification Page  Notification Reason Other (Comment) (New yellow mews HR 120s)  Provider response  (Pt underutilizing her PCA)  Date of Provider Response 08/22/23  Time of Provider Response [phone]  Assess: SIRS CRITERIA  SIRS Temperature  0  SIRS Respirations  0  SIRS Pulse 1  SIRS WBC 0  SIRS Score Sum  1

## 2023-08-22 NOTE — Progress Notes (Signed)
 Patient went to Red MEWS at 1549, Marylou Flesher NP made aware, Tylenol ordered and administered, Lab orders in and drawn, Charge Nurse made aware. Will continue to re-evaluate and follow up.

## 2023-08-22 NOTE — Plan of Care (Signed)

## 2023-08-22 NOTE — Progress Notes (Signed)
 Pt's critical value reported, Dr Hyman Hopes made aware by the NP. Night Shift RN also made aware . NP states she will continue to follow in AM.

## 2023-08-23 LAB — URINALYSIS, COMPLETE (UACMP) WITH MICROSCOPIC
Bilirubin Urine: NEGATIVE
Glucose, UA: NEGATIVE mg/dL
Hgb urine dipstick: NEGATIVE
Ketones, ur: NEGATIVE mg/dL
Leukocytes,Ua: NEGATIVE
Nitrite: NEGATIVE
Protein, ur: NEGATIVE mg/dL
Specific Gravity, Urine: 1.013 (ref 1.005–1.030)
pH: 5 (ref 5.0–8.0)

## 2023-08-23 LAB — TYPE AND SCREEN
ABO/RH(D): B POS
Antibody Screen: NEGATIVE
Unit division: 0

## 2023-08-23 LAB — BPAM RBC
Blood Product Expiration Date: 202505162359
ISSUE DATE / TIME: 202504082258
Unit Type and Rh: 5100

## 2023-08-23 LAB — CBC
HCT: 23.7 % — ABNORMAL LOW (ref 36.0–46.0)
Hemoglobin: 8.1 g/dL — ABNORMAL LOW (ref 12.0–15.0)
MCH: 35.2 pg — ABNORMAL HIGH (ref 26.0–34.0)
MCHC: 34.2 g/dL (ref 30.0–36.0)
MCV: 103 fL — ABNORMAL HIGH (ref 80.0–100.0)
Platelets: 211 10*3/uL (ref 150–400)
RBC: 2.3 MIL/uL — ABNORMAL LOW (ref 3.87–5.11)
RDW: 20.7 % — ABNORMAL HIGH (ref 11.5–15.5)
WBC: 16.3 10*3/uL — ABNORMAL HIGH (ref 4.0–10.5)
nRBC: 7.4 % — ABNORMAL HIGH (ref 0.0–0.2)

## 2023-08-23 MED ORDER — ENOXAPARIN SODIUM 40 MG/0.4ML IJ SOSY
40.0000 mg | PREFILLED_SYRINGE | INTRAMUSCULAR | Status: DC
  Filled 2023-08-23 (×3): qty 0.4

## 2023-08-23 NOTE — Progress Notes (Signed)
 The patient refused Lovenox today. Education provided on benefits of medication. Pt is not agreeable at this time.

## 2023-08-23 NOTE — Plan of Care (Signed)
  Problem: Education: Goal: Knowledge of General Education information will improve Description: Including pain rating scale, medication(s)/side effects and non-pharmacologic comfort measures Outcome: Progressing   Problem: Nutrition: Goal: Adequate nutrition will be maintained Outcome: Progressing   Problem: Coping: Goal: Level of anxiety will decrease Outcome: Progressing   Problem: Elimination: Goal: Will not experience complications related to bowel motility Outcome: Progressing   Problem: Pain Managment: Goal: General experience of comfort will improve and/or be controlled Outcome: Progressing   Problem: Safety: Goal: Ability to remain free from injury will improve Outcome: Progressing   Problem: Skin Integrity: Goal: Risk for impaired skin integrity will decrease Outcome: Progressing

## 2023-08-23 NOTE — Progress Notes (Signed)
 Patient ID: Bailey Reese, female   DOB: [birth year], 18 y.o.   MRN: [id number] Subjective: Bailey Reese  is a 18 y.o. female with medical history significant of sickle cell disease, PTSD, depression with anxiety who usually gets her care at [facility] but was seen at [facility] for the last 24 hours where she stayed in the ER with sickle cell pain crisis.  Patient was managed in the ER for crisis however her pain was not controlled. Patient was transferred to our care for continued sickle cell crisis pain control. Patient reported to have pain similar to her typical sickle cell crisis. Is mainly in her legs and back.  She has vitals that are stable.  Hemoglobin is around 7.4 slightly lower than her base line.  She was getting IV Dilaudid pushes in the ER.  Patient denied any nausea vomiting or diarrhea.  Denied any viral illness.  She has had previous osteomyelitis and multiple episodes of acute chest syndromes.  Also hemorrhagic stroke in 2017.  Patient has chronic pain from her sickle cell disease.  She has been admitted for sickle cell pain management.   Patient developed a fever yesterday of 100.4, with heart rate of 124, Patient was started on antibiotics and given tylenol for her fever.  Today she is afebrile, and reports ongoing pain of 9/10. No nausea, cough or vomiting.  Objective:  Vital signs in last 24 hours:  Vitals:   08/23/23 0637 08/23/23 0821 08/23/23 0842 08/23/23 1151  BP:  107/76  116/83  Pulse:  (!) 109  (!) 124  Resp:  16 18 16   Temp: 99.3 F (37.4 C) 99.1 F (37.3 C)  (!) 100.6 F (38.1 C)  TempSrc: Oral Oral  Oral  SpO2:  99% 98% 98%    Intake/Output from previous day:   Intake/Output Summary (Last 24 hours) at 08/23/2023 1237 Last data filed at 08/23/2023 1100 Gross per 24 hour  Intake 1774.62 ml  Output 1725 ml  Net 49.62 ml    Physical Exam: General: Alert, awake, oriented x3, in no acute distress.  HEENT: [place]/AT PEERL, EOMI Neck: Trachea  midline,  no masses, no thyromegal,y no JVD, no carotid bruit OROPHARYNX:  Moist, No exudate/ erythema/lesions.  Heart: Regular rate and rhythm, without murmurs, rubs, gallops, PMI non-displaced, no heaves or thrills on palpation.  Lungs: Clear to auscultation, no wheezing or rhonchi noted. No increased vocal fremitus resonant to percussion  Abdomen: Soft, nontender, nondistended, positive bowel sounds, no masses no hepatosplenomegaly noted..  Neuro: No focal neurological deficits noted cranial nerves II through XII grossly intact. DTRs 2+ bilaterally upper and lower extremities. Strength 5 out of 5 in bilateral upper and lower extremities. Musculoskeletal: No warm swelling or erythema around joints, no spinal tenderness noted. Psychiatric: Patient alert and oriented x3, good insight and cognition, good recent to remote recall. Lymph node survey: No cervical axillary or inguinal lymphadenopathy noted.  Lab Results:  Basic Metabolic Panel:    Component Value Date/Time   NA 139 08/21/2023 0835   NA 135 05/01/2013 1440   K 3.4 (L) 08/21/2023 0835   K 4.3 05/01/2013 1440   CL 108 08/21/2023 0835   CL 102 05/01/2013 1440   CO2 22 08/21/2023 0835   CO2 27 (H) 05/01/2013 1440   BUN 12 08/21/2023 0835   BUN 13 05/01/2013 1440   CREATININE 0.49 08/21/2023 0835   CREATININE 0.40 (L) 05/01/2013 1440   GLUCOSE 108 (H) 08/21/2023 0835   GLUCOSE 108 (H)  05/01/2013 1440   CALCIUM 9.2 08/21/2023 0835   CALCIUM 9.8 05/01/2013 1440   CBC:    Component Value Date/Time   WBC 16.3 (H) 08/23/2023 0539   HGB 8.1 (L) 08/23/2023 0539   HGB 6.6 (L) 05/01/2013 1440   HCT 23.7 (L) 08/23/2023 0539   HCT 20.1 (L) 05/01/2013 1440   PLT 211 08/23/2023 0539   PLT 468 (H) 05/01/2013 1440   MCV 103.0 (H) 08/23/2023 0539   MCV 89 05/01/2013 1440   NEUTROABS 10.7 (H) 08/22/2023 1718   NEUTROABS 7.9 05/30/2012 1958   LYMPHSABS 2.5 08/22/2023 1718   LYMPHSABS 5.0 05/30/2012 1958   MONOABS 3.4 (H) 08/22/2023  1718   MONOABS 1.6 (H) 05/30/2012 1958   EOSABS 0.2 08/22/2023 1718   EOSABS 0.6 05/30/2012 1958   BASOSABS 0.1 08/22/2023 1718   BASOSABS 3 04/28/2013 1736   BASOSABS 0.2 (H) 05/30/2012 1958    Recent Results (from the past 240 hours)  Culture, blood (Routine X 2) w Reflex to ID Panel     Status: None (Preliminary result)   Collection Time: 08/22/23  5:31 PM   Specimen: BLOOD  Result Value Ref Range Status   Specimen Description   Final    BLOOD BLOOD RIGHT HAND Performed at [facility], [address] [place], [place] [address]    Special Requests   Final    BOTTLES DRAWN AEROBIC AND ANAEROBIC Blood Culture adequate volume Performed at [facility], [address], [place], [place] [address]    Culture   Final    NO GROWTH < 12 HOURS Performed at [facility], [address] [place], [place] [address]    Report Status PENDING  Incomplete    Studies/Results: DG Chest 2 View Result Date: 08/21/2023 CLINICAL DATA:  chest pain, sickle cell pain crisis, eval for unlikely acute chest EXAM: CHEST - 2 VIEW COMPARISON:  05/31/2023. FINDINGS: Cardiac silhouette is unremarkable. No pneumothorax or pleural effusion. The lungs are clear. Mild thoracic sickle cell related endplate changes. No acute osseous abnormalities. T12 mild endplate changes may be sickle cell-related. IMPRESSION: No acute cardiopulmonary process. Electronically Signed   By: Layla Maw M.D.   On: 08/21/2023 13:21    Medications: Scheduled Meds:  enoxaparin (LOVENOX) injection  40 mg Subcutaneous Q24H   HYDROmorphone   Intravenous Q4H   hydroxyurea  500 mg Oral Daily   ketorolac  15 mg Intravenous Q6H   lubiprostone  8 mcg Oral BID WC   pantoprazole  40 mg Oral Daily   pregabalin  75 mg Oral TID   senna-docusate  1 tablet Oral BID   sucralfate  1 g Oral BID   Continuous Infusions:  ceFEPime (MAXIPIME) IV 2 g (08/23/23 0954)   vancomycin 1,000 mg (08/23/23  0508)   PRN Meds:.acetaminophen, diphenhydrAMINE, naloxone **AND** sodium chloride flush, ondansetron (ZOFRAN) IV, mouth rinse, oxyCODONE, polyethylene glycol, prazosin  Consultants: None  Procedures: None  Antibiotics: Vancomycin Cefepime  Assessment/Plan: Principal Problem:   Sickle cell anemia with pain (HCC) Active Problems:   Chronic pain syndrome   Leucocytosis   Hb Sickle Cell Disease with Pain crisis: Continue IVF 0.45% Saline @ KVO/hour, continue weight based Dilaudid PCA, IV Toradol 15 mg Q 6 H for a total of 5 days, continue oral home pain medications as ordered. Monitor vitals very closely, Re-evaluate pain scale regularly, 2 L of Oxygen by [place]. Patient encouraged to ambulate on the hallway today.  Leukocytosis: Elevated, Currently on  antibiotics Anemia of Chronic Disease: HGB stable no need for transfusion. Will continue to monitor. Daily CBC in place. Chronic pain Syndrome: Continue Home management  Code Status: Full Code Family Communication: N/A Disposition Plan: Not yet ready for discharge  Daryll Drown NP  If 7PM-7AM, please contact night-coverage.  08/23/2023, 12:37 PM  LOS: 2 days

## 2023-08-23 NOTE — Progress Notes (Signed)
 Pt remained in yellow MEWS throughout shift, charge nurse and hospitalist were notified. ICU rapid nurse was informed as well, saw pt. HR remains elevated, did decrease post blood transfusion. Pt received 1 unit of PRBCS, no transfusion reaction noted. Pt also had a temp this shift, tylenol was administered. Pt also bundled in blanks, hot packs, and k pad. Pt was advised to remove so of th heat from her body so an accurate temp could be obtained, pt decline. Thermostat decreased. Call light within reach.

## 2023-08-24 LAB — CREATININE, SERUM
Creatinine, Ser: 0.48 mg/dL (ref 0.44–1.00)
GFR, Estimated: >60 mL/min (ref >60)

## 2023-08-24 LAB — CBC
HCT: 21.4 % — ABNORMAL LOW (ref 36.0–46.0)
Hemoglobin: 7.2 g/dL — ABNORMAL LOW (ref 12.0–15.0)
MCH: 34.8 pg — ABNORMAL HIGH (ref 26.0–34.0)
MCHC: 33.6 g/dL (ref 30.0–36.0)
MCV: 103.4 fL — ABNORMAL HIGH (ref 80.0–100.0)
Platelets: 199 10*3/uL (ref 150–400)
RBC: 2.07 MIL/uL — ABNORMAL LOW (ref 3.87–5.11)
RDW: 19.2 % — ABNORMAL HIGH (ref 11.5–15.5)
WBC: 14.8 10*3/uL — ABNORMAL HIGH (ref 4.0–10.5)
nRBC: 12.9 % — ABNORMAL HIGH (ref 0.0–0.2)

## 2023-08-24 LAB — RESP PANEL BY RT-PCR (RSV, FLU A&B, COVID)  RVPGX2
Influenza A by PCR: NEGATIVE
Influenza B by PCR: NEGATIVE
Resp Syncytial Virus by PCR: NEGATIVE
SARS Coronavirus 2 by RT PCR: NEGATIVE

## 2023-08-24 MED ORDER — ACETAMINOPHEN 500 MG PO TABS
500.0000 mg | ORAL_TABLET | Freq: Once | ORAL | Status: AC
  Administered 2023-08-24: 500 mg via ORAL
  Filled 2023-08-24: qty 1

## 2023-08-24 MED ORDER — GUAIFENESIN ER 600 MG PO TB12
600.0000 mg | ORAL_TABLET | Freq: Two times a day (BID) | ORAL | Status: AC
  Administered 2023-08-24 – 2023-08-28 (×10): 600 mg via ORAL
  Filled 2023-08-24 (×10): qty 1

## 2023-08-24 NOTE — Progress Notes (Signed)
 Patient ID: Bailey Reese, female   DOB: 15-Feb-2006, 18 y.o.   MRN: [id number] Subjective: Bailey Reese  is a 18 y.o. female with medical history significant of sickle cell disease, PTSD, depression with anxiety who usually gets her care at [facility] but was seen at [facility] for the last 24 hours where she stayed in the ER with sickle cell pain crisis.  Patient was managed in the ER for crisis however her pain was not controlled. Patient was transferred to our care for continued sickle cell crisis pain control. Patient reported to have pain similar to her typical sickle cell crisis. Is mainly in her legs and back.  She has vitals that are stable.  Hemoglobin is around 7.4 slightly lower than her base line.  She was getting IV Dilaudid pushes in the ER.  Patient denied any nausea vomiting or diarrhea.  Denied any viral illness.  She has had previous osteomyelitis and multiple episodes of acute chest syndromes.  Also hemorrhagic stroke in 2017.  Patient has chronic pain from her sickle cell disease.  She has been admitted for sickle cell pain management.   Today she is febrile with temp 100.7, and reports ongoing pain of 9/10. No nausea, or vomiting. Reporting new cough, PCR orders completed.  Airborne and contact precautions in place while PCR results pending.  Mucinex for ongoing cough. Will follow-up and reassess in the morning.  Objective:  Vital signs in last 24 hours:  Vitals:   08/24/23 0510 08/24/23 0855 08/24/23 0952 08/24/23 1051  BP: 114/62   137/81  Pulse: (!) 121   (!) 111  Resp: 18 20 16 15   Temp: (!) 100.7 F (38.2 C)   99.7 F (37.6 C)  TempSrc: Oral   Oral  SpO2: 100% 98% 100% 100%  Weight:      Height:        Intake/Output from previous day:   Intake/Output Summary (Last 24 hours) at 08/24/2023 1249 Last data filed at 08/24/2023 0300 Gross per 24 hour  Intake 1182.26 ml  Output 700 ml  Net 482.26 ml    Physical Exam: General: Alert, awake,  oriented x3, in no acute distress.  HEENT: [place]/AT PEERL, EOMI Neck: Trachea midline,  no masses, no thyromegal,y no JVD, no carotid bruit OROPHARYNX:  Moist, No exudate/ erythema/lesions.  Heart: Regular rate and rhythm, without murmurs, rubs, gallops, PMI non-displaced, no heaves or thrills on palpation.  Lungs: Clear to auscultation, no wheezing or rhonchi noted. No increased vocal fremitus resonant to percussion  Abdomen: Soft, nontender, nondistended, positive bowel sounds, no masses no hepatosplenomegaly noted..  Neuro: No focal neurological deficits noted cranial nerves II through XII grossly intact. DTRs 2+ bilaterally upper and lower extremities. Strength 5 out of 5 in bilateral upper and lower extremities. Musculoskeletal:Generalize body pain, bilateral lower extremity pain  Psychiatric: Patient alert and oriented x3, good insight and cognition, good recent to remote recall. Lymph node survey: No cervical axillary or inguinal lymphadenopathy noted.  Lab Results:  Basic Metabolic Panel:    Component Value Date/Time   NA 139 08/21/2023 0835   NA 135 05/01/2013 1440   K 3.4 (L) 08/21/2023 0835   K 4.3 05/01/2013 1440   CL 108 08/21/2023 0835   CL 102 05/01/2013 1440   CO2 22 08/21/2023 0835   CO2 27 (H) 05/01/2013 1440   BUN 12 08/21/2023 0835   BUN 13 05/01/2013 1440   CREATININE 0.48 08/24/2023 0537   CREATININE 0.40 (L) 05/01/2013 1440  GLUCOSE 108 (H) 08/21/2023 0835   GLUCOSE 108 (H) 05/01/2013 1440   CALCIUM 9.2 08/21/2023 0835   CALCIUM 9.8 05/01/2013 1440   CBC:    Component Value Date/Time   WBC 14.8 (H) 08/24/2023 0537   HGB 7.2 (L) 08/24/2023 0537   HGB 6.6 (L) 05/01/2013 1440   HCT 21.4 (L) 08/24/2023 0537   HCT 20.1 (L) 05/01/2013 1440   PLT 199 08/24/2023 0537   PLT 468 (H) 05/01/2013 1440   MCV 103.4 (H) 08/24/2023 0537   MCV 89 05/01/2013 1440   NEUTROABS 10.7 (H) 08/22/2023 1718   NEUTROABS 7.9 05/30/2012 1958   LYMPHSABS 2.5 08/22/2023 1718    LYMPHSABS 5.0 05/30/2012 1958   MONOABS 3.4 (H) 08/22/2023 1718   MONOABS 1.6 (H) 05/30/2012 1958   EOSABS 0.2 08/22/2023 1718   EOSABS 0.6 05/30/2012 1958   BASOSABS 0.1 08/22/2023 1718   BASOSABS 3 04/28/2013 1736   BASOSABS 0.2 (H) 05/30/2012 1958    Recent Results (from the past 240 hours)  Culture, blood (Routine X 2) w Reflex to ID Panel     Status: None (Preliminary result)   Collection Time: 08/22/23  5:31 PM   Specimen: BLOOD  Result Value Ref Range Status   Specimen Description   Final    BLOOD BLOOD RIGHT HAND Performed at [facility], [address] [place], [place] [address]    Special Requests   Final    BOTTLES DRAWN AEROBIC AND ANAEROBIC Blood Culture adequate volume Performed at [facility], [address], [place], [place] [address]    Culture   Final    NO GROWTH 2 DAYS Performed at [facility], [address], [place], [place] [address]    Report Status PENDING  Incomplete    Studies/Results: No results found.  Medications: Scheduled Meds:  enoxaparin (LOVENOX) injection  40 mg Subcutaneous Q24H   guaiFENesin  600 mg Oral BID   HYDROmorphone   Intravenous Q4H   hydroxyurea  500 mg Oral Daily   ketorolac  15 mg Intravenous Q6H   lubiprostone  8 mcg Oral BID WC   pantoprazole  40 mg Oral Daily   pregabalin  75 mg Oral TID   senna-docusate  1 tablet Oral BID   sucralfate  1 g Oral BID   Continuous Infusions:  ceFEPime (MAXIPIME) IV 2 g (08/24/23 0949)   vancomycin 1,000 mg (08/24/23 0502)   PRN Meds:.acetaminophen, diphenhydrAMINE, naloxone **AND** sodium chloride flush, ondansetron (ZOFRAN) IV, mouth rinse, oxyCODONE, polyethylene glycol, prazosin  Consultants: Pharmacy: completed  Procedures: None  Antibiotics: Vancomycin Cefepime  Assessment/Plan: Principal Problem:   Sickle cell anemia with pain (HCC) Active Problems:   Chronic pain syndrome   Leucocytosis   Hb Sickle Cell  Disease with Pain crisis: Continue IVF 0.45% Saline @ KVO/hour, continue weight based Dilaudid PCA, IV Toradol 15 mg Q 6 H for a total of 5 days, continue oral home pain medications as ordered. Monitor vitals very closely, Re-evaluate pain scale regularly, 2 L of Oxygen by [place]. Patient encouraged to ambulate on the hallway today.  Leukocytosis: Elevated, currently on antibiotics  Anemia of Chronic Disease: Hemoglobin stable no need for transfusion.  Will continue to monitor daily CBC in place. Chronic pain Syndrome: Continue home medication management.   Code Status: Full Code Family Communication: N/A Disposition Plan: Not yet ready for discharge  Daryll Drown NP  If 7PM-7AM, please contact night-coverage.  08/24/2023, 12:49 PM  LOS: 3  days

## 2023-08-24 NOTE — Progress Notes (Signed)
   08/24/23 1536  TOC Brief Assessment  Insurance and Status Reviewed  Patient has primary care physician Yes Neita Carp, MD)  Home environment has been reviewed Home alone  Prior level of function: Independent  Prior/Current Home Services No current home services  Social Drivers of Health Review SDOH reviewed no interventions necessary  Readmission risk has been reviewed Yes  Transition of care needs no transition of care needs at this time

## 2023-08-24 NOTE — Plan of Care (Signed)
  Problem: Education: Goal: Knowledge of General Education information will improve Description: Including pain rating scale, medication(s)/side effects and non-pharmacologic comfort measures Outcome: Progressing   Problem: Clinical Measurements: Goal: Ability to maintain clinical measurements within normal limits will improve Outcome: Progressing   Problem: Activity: Goal: Risk for activity intolerance will decrease Outcome: Progressing   Problem: Nutrition: Goal: Adequate nutrition will be maintained Outcome: Progressing   Problem: Elimination: Goal: Will not experience complications related to bowel motility Outcome: Progressing   Problem: Pain Managment: Goal: General experience of comfort will improve and/or be controlled Outcome: Progressing   Problem: Safety: Goal: Ability to remain free from injury will improve Outcome: Progressing

## 2023-08-25 MED ORDER — SODIUM CHLORIDE 0.9% FLUSH
10.0000 mL | Freq: Two times a day (BID) | INTRAVENOUS | Status: DC
  Administered 2023-08-25 – 2023-08-29 (×7): 10 mL

## 2023-08-25 MED ORDER — ACETAMINOPHEN 325 MG PO TABS
650.0000 mg | ORAL_TABLET | Freq: Four times a day (QID) | ORAL | Status: DC | PRN
  Administered 2023-08-25 – 2023-08-26 (×3): 650 mg via ORAL
  Filled 2023-08-25 (×3): qty 2

## 2023-08-25 MED ORDER — LEVALBUTEROL HCL 0.63 MG/3ML IN NEBU
0.6300 mg | INHALATION_SOLUTION | Freq: Four times a day (QID) | RESPIRATORY_TRACT | Status: DC | PRN
  Administered 2023-08-26: 0.63 mg via RESPIRATORY_TRACT
  Filled 2023-08-25: qty 3

## 2023-08-25 NOTE — Progress Notes (Signed)
 Failed attempts for piv placement. Rn stated MD has been notified and is aware. Midline was ordered, will re assess. L arm w/ some swelling on Lower forearm from previous piv. Rn to elevate and place compress to bring down swelling. Will attempt again later, Patient is also aware and is agreeable for IVT to try again later.

## 2023-08-25 NOTE — Progress Notes (Signed)
 Subjective: Bailey Reese  is a 18 y.o. female with medical history significant of sickle cell disease, PTSD, depression with anxiety who usually gets her care at [facility] but was seen at [facility] for the last 24 hours where she stayed in the ER with sickle cell pain crisis.  Patient was managed in the ER for crisis however her pain was not controlled. Patient was transferred to our care for continued sickle cell crisis pain control. Patient reported to have pain similar to her typical sickle cell crisis. Is mainly in her legs and back.  She has vitals that are stable.  Hemoglobin is around 7.4 slightly lower than her base line.  She was getting IV Dilaudid pushes in the ER.  Patient denied any nausea vomiting or diarrhea.  Denied any viral illness.  She has had previous osteomyelitis and multiple episodes of acute chest syndromes.  Also hemorrhagic stroke in 2017.  Patient has chronic pain from her sickle cell disease.  She has been admitted for sickle cell pain management.    Patient is afebrile today still rating her pain as 9/10.  She reports that she is hurting everywhere.  PCR came back negative.  No active signs/symptoms of infection.  Continue Mucinex for ongoing cough, ambulation encouraged.  Continue current pain medication.  Will follow-up with patient in the a.m. to reassess pain      Objective:  Vital signs in last 24 hours:  Vitals:   08/25/23 1102 08/25/23 1243 08/25/23 1330 08/25/23 1333  BP: 110/66  (!) 90/56 (!) 90/56  Pulse: 91  (!) 110 (!) 110  Resp: 16 20  16   Temp: 98.5 F (36.9 C)   100 F (37.8 C)  TempSrc: Oral   Oral  SpO2: 100% 98% 100% 100%  Weight:      Height:        Intake/Output from previous day:   Intake/Output Summary (Last 24 hours) at 08/25/2023 1601 Last data filed at 08/25/2023 1100 Gross per 24 hour  Intake 597 ml  Output --  Net 597 ml    Physical Exam: General: Alert, awake, oriented x3, in no acute distress.  HEENT:  [place]/AT PEERL, EOMI Neck: Trachea midline,  no masses, no thyromegal,y no JVD, no carotid bruit OROPHARYNX:  Moist, No exudate/ erythema/lesions.  Heart: Regular rate and rhythm, without murmurs, rubs, gallops, PMI non-displaced, no heaves or thrills on palpation.  Lungs: Clear to auscultation, no wheezing or rhonchi noted. No increased vocal fremitus resonant to percussion  Abdomen: Soft, nontender, nondistended, positive bowel sounds, no masses no hepatosplenomegaly noted..  Neuro: No focal neurological deficits noted cranial nerves II through XII grossly intact. DTRs 2+ bilaterally upper and lower extremities. Strength 5 out of 5 in bilateral upper and lower extremities. Musculoskeletal: Bilateral lower extremity pain, generalized body pain.   Psychiatric: Patient alert and oriented x3, good insight and cognition, good recent to remote recall. Lymph node survey: No cervical axillary or inguinal lymphadenopathy noted.  Lab Results:  Basic Metabolic Panel:    Component Value Date/Time   NA 139 08/21/2023 0835   NA 135 05/01/2013 1440   K 3.4 (L) 08/21/2023 0835   K 4.3 05/01/2013 1440   CL 108 08/21/2023 0835   CL 102 05/01/2013 1440   CO2 22 08/21/2023 0835   CO2 27 (H) 05/01/2013 1440   BUN 12 08/21/2023 0835   BUN 13 05/01/2013 1440   CREATININE 0.48 08/24/2023 0537   CREATININE 0.40 (L) 05/01/2013 1440  GLUCOSE 108 (H) 08/21/2023 0835   GLUCOSE 108 (H) 05/01/2013 1440   CALCIUM 9.2 08/21/2023 0835   CALCIUM 9.8 05/01/2013 1440   CBC:    Component Value Date/Time   WBC 14.8 (H) 08/24/2023 0537   HGB 7.2 (L) 08/24/2023 0537   HGB 6.6 (L) 05/01/2013 1440   HCT 21.4 (L) 08/24/2023 0537   HCT 20.1 (L) 05/01/2013 1440   PLT 199 08/24/2023 0537   PLT 468 (H) 05/01/2013 1440   MCV 103.4 (H) 08/24/2023 0537   MCV 89 05/01/2013 1440   NEUTROABS 10.7 (H) 08/22/2023 1718   NEUTROABS 7.9 05/30/2012 1958   LYMPHSABS 2.5 08/22/2023 1718   LYMPHSABS 5.0 05/30/2012 1958   MONOABS  3.4 (H) 08/22/2023 1718   MONOABS 1.6 (H) 05/30/2012 1958   EOSABS 0.2 08/22/2023 1718   EOSABS 0.6 05/30/2012 1958   BASOSABS 0.1 08/22/2023 1718   BASOSABS 3 04/28/2013 1736   BASOSABS 0.2 (H) 05/30/2012 1958    Recent Results (from the past 240 hours)  Culture, blood (Routine X 2) w Reflex to ID Panel     Status: None (Preliminary result)   Collection Time: 08/22/23  5:31 PM   Specimen: BLOOD  Result Value Ref Range Status   Specimen Description   Final    BLOOD BLOOD RIGHT HAND Performed at [facility], [address] [place], [place] [address]    Special Requests   Final    BOTTLES DRAWN AEROBIC AND ANAEROBIC Blood Culture adequate volume Performed at [facility], [address], [place], [place] [address]    Culture   Final    NO GROWTH 3 DAYS Performed at [facility], [address], [place], [place] [address]    Report Status PENDING  Incomplete  Resp panel by RT-PCR (RSV, Flu A&B, Covid) Anterior Nasal Swab     Status: None   Collection Time: 08/24/23  2:18 PM   Specimen: Anterior Nasal Swab  Result Value Ref Range Status   SARS Coronavirus 2 by RT PCR NEGATIVE NEGATIVE Final    Comment: (NOTE) SARS-CoV-2 target nucleic acids are NOT DETECTED.  The SARS-CoV-2 RNA is generally detectable in upper respiratory specimens during the acute phase of infection. The lowest concentration of SARS-CoV-2 viral copies this assay can detect is 138 copies/mL. A negative result does not preclude SARS-Cov-2 infection and should not be used as the sole basis for treatment or other patient management decisions. A negative result may occur with  improper specimen collection/handling, submission of specimen other than nasopharyngeal swab, presence of viral mutation(s) within the areas targeted by this assay, and inadequate number of viral copies(<138 copies/mL). A negative result must be combined with clinical observations, patient  history, and epidemiological information. The expected result is Negative.  Fact Sheet for Patients:  BloggerCourse.com  Fact Sheet for Healthcare Providers:  SeriousBroker.it  This test is no t yet approved or cleared by the [place] FDA and  has been authorized for detection and/or diagnosis of SARS-CoV-2 by FDA under an Emergency Use Authorization (EUA). This EUA will remain  in effect (meaning this test can be used) for the duration of the COVID-19 declaration under Section 564(b)(1) of the Act, 21 U.S.C.section 360bbb-3(b)(1), unless the authorization is terminated  or revoked sooner.       Influenza A by PCR NEGATIVE NEGATIVE Final   Influenza B by PCR NEGATIVE NEGATIVE Final    Comment: (NOTE) The Xpert Xpress SARS-CoV-2/FLU/RSV plus assay is intended  as an aid in the diagnosis of influenza from Nasopharyngeal swab specimens and should not be used as a sole basis for treatment. Nasal washings and aspirates are unacceptable for Xpert Xpress SARS-CoV-2/FLU/RSV testing.  Fact Sheet for Patients: BloggerCourse.com  Fact Sheet for Healthcare Providers: SeriousBroker.it  This test is not yet approved or cleared by the [place] FDA and has been authorized for detection and/or diagnosis of SARS-CoV-2 by FDA under an Emergency Use Authorization (EUA). This EUA will remain in effect (meaning this test can be used) for the duration of the COVID-19 declaration under Section 564(b)(1) of the Act, 21 U.S.C. section 360bbb-3(b)(1), unless the authorization is terminated or revoked.     Resp Syncytial Virus by PCR NEGATIVE NEGATIVE Final    Comment: (NOTE) Fact Sheet for Patients: BloggerCourse.com  Fact Sheet for Healthcare Providers: SeriousBroker.it  This test is not yet approved or cleared by the [place] FDA  and has been authorized for detection and/or diagnosis of SARS-CoV-2 by FDA under an Emergency Use Authorization (EUA). This EUA will remain in effect (meaning this test can be used) for the duration of the COVID-19 declaration under Section 564(b)(1) of the Act, 21 U.S.C. section 360bbb-3(b)(1), unless the authorization is terminated or revoked.  Performed at [facility], [address] [place], [place] [address]     Studies/Results: No results found.  Medications: Scheduled Meds:  enoxaparin (LOVENOX) injection  40 mg Subcutaneous Q24H   guaiFENesin  600 mg Oral BID   HYDROmorphone   Intravenous Q4H   hydroxyurea  500 mg Oral Daily   ketorolac  15 mg Intravenous Q6H   lubiprostone  8 mcg Oral BID WC   pantoprazole  40 mg Oral Daily   pregabalin  75 mg Oral TID   senna-docusate  1 tablet Oral BID   sodium chloride flush  10-40 mL Intracatheter Q12H   sucralfate  1 g Oral BID   Continuous Infusions:  ceFEPime (MAXIPIME) IV 2 g (08/25/23 1412)   vancomycin 1,000 mg (08/25/23 0652)   PRN Meds:.acetaminophen, diphenhydrAMINE, naloxone **AND** sodium chloride flush, ondansetron (ZOFRAN) IV, mouth rinse, oxyCODONE, polyethylene glycol, prazosin  Consultants: None  Procedures: None  Antibiotics: Vancomycin Cefepime  Assessment/Plan: Principal Problem:   Sickle cell anemia with pain (HCC) Active Problems:   Chronic pain syndrome   Leucocytosis   Hb Sickle Cell Disease with Pain crisis: Continue IVF 0.45% Saline @KVO  mls/hour, continue weight based Dilaudid PCA, IV Toradol 15 mg Q 6 H for a total of 5 days, continue oral home pain medications as ordered. Monitor vitals very closely, Re-evaluate pain scale regularly, 2 L of Oxygen by Pray. Patient encouraged to ambulate on the hallway today.  Leukocytosis: Elevated, currently on antibiotics. Anemia of Chronic Disease: Hemoglobin stable no need for transfusion we will continue to monitor, daily CBC in  place. Chronic pain Syndrome: Continue oral home medication for pain management.  Code Status: Full Code Family Communication: N/A Disposition Plan: Not yet ready for discharge  Daryll Drown NP  If 7PM-7AM, please contact night-coverage.  08/25/2023, 4:01 PM  LOS: 4 days

## 2023-08-25 NOTE — Progress Notes (Signed)
 The patient currently has no access, as one line has infiltrated and the other line cannot be used to push fluids. Left lower forearm swollen from previous PIV, warm compress applied to arm and elevated. Pharmacy also made aware of PIV infiltration, no actions necessary based on hydromorphone being infused.    An IV nurse attempted to place a line but was unsuccessful. MD notified and aware. IV nurse suggested that the patient may require a midline catheter because her veins are deeper, and one of her veins is located above an artery. If a midline catheter can be placed, the patient-who currently has vancomycin ordered-would be able to receive the medication through the midline for six days. However, if the midline attempt is unsuccessful, a double-lumen PICC line may be necessary, as the patient is receiving dilaudid PCA and antibiotics. Patient has stated she has had a PICC line in the past.

## 2023-08-25 NOTE — Progress Notes (Signed)
 Pharmacy Antibiotic Note  Bailey Reese is a 18 y.o. female  with hx sickle cell anemia who presented to the ED on 08/21/2023 with sickle cell pain crisis.  She was started on vancomycin and cefepime on 08/22/23 for broad coverage for fever.   Today, 08/25/2023: - day #4 abx - Tmax 102.9 - wbc 14.8 on 4/10 - scr 0.48 on 4/11 - all cultures have been negative thus far  Plan: - continue vancomycin 1000 mg q12h and cfepime 2gm IV q8h  ____________________________________  Height: 5\' 4"  (162.6 cm) Weight: 65.7 kg (144 lb 13.5 oz) IBW/kg (Calculated) : 54.7  Temp (24hrs), Avg:99.6 F (37.6 C), Min:97.7 F (36.5 C), Max:102.9 F (39.4 C)  Recent Labs  Lab 08/21/23 0835 08/22/23 1718 08/22/23 2102 08/23/23 0539 08/24/23 0537  WBC 15.5* 17.1* 18.9* 16.3* 14.8*  CREATININE 0.49  --   --   --  0.48    Estimated Creatinine Clearance: 105.5 mL/min (by C-G formula based on SCr of 0.48 mg/dL).    Allergies  Allergen Reactions   Cherry Anaphylaxis, Itching and Swelling   Olanzapine Other (See Comments)    "Drug-induced liver injury"   Other Swelling and Other (See Comments)    "Pitted fruits"   Peanut-Containing Drug Products Anaphylaxis, Itching and Swelling   Plum Pulp Anaphylaxis and Hives   Ibuprofen Nausea And Vomiting   Morphine And Codeine Hives and Itching   Peach [Prunus Persica] Itching, Swelling and Other (See Comments)   Tylenol [Acetaminophen] Nausea And Vomiting     Thank you for allowing pharmacy to be a part of this patient's care.  Lucia Gaskins 08/25/2023 8:59 AM

## 2023-08-25 NOTE — Progress Notes (Signed)

## 2023-08-26 DIAGNOSIS — D5700 Hb-SS disease with crisis, unspecified: Secondary | ICD-10-CM | POA: Diagnosis not present

## 2023-08-26 LAB — CBC WITH DIFFERENTIAL/PLATELET
Abs Immature Granulocytes: 0.05 10*3/uL (ref 0.00–0.07)
Basophils Absolute: 0.1 10*3/uL (ref 0.0–0.1)
Basophils Relative: 1 %
Eosinophils Absolute: 0.4 10*3/uL (ref 0.0–0.5)
Eosinophils Relative: 3 %
HCT: 18.6 % — ABNORMAL LOW (ref 36.0–46.0)
Hemoglobin: 6.3 g/dL — CL (ref 12.0–15.0)
Immature Granulocytes: 0 %
Lymphocytes Relative: 9 %
Lymphs Abs: 1.1 10*3/uL (ref 0.7–4.0)
MCH: 33.2 pg (ref 26.0–34.0)
MCHC: 33.9 g/dL (ref 30.0–36.0)
MCV: 97.9 fL (ref 80.0–100.0)
Monocytes Absolute: 1 10*3/uL (ref 0.1–1.0)
Monocytes Relative: 9 %
Neutro Abs: 9 10*3/uL — ABNORMAL HIGH (ref 1.7–7.7)
Neutrophils Relative %: 78 %
Platelets: 233 10*3/uL (ref 150–400)
RBC: 1.9 MIL/uL — ABNORMAL LOW (ref 3.87–5.11)
RDW: 18.2 % — ABNORMAL HIGH (ref 11.5–15.5)
WBC: 11.5 10*3/uL — ABNORMAL HIGH (ref 4.0–10.5)
nRBC: 5.7 % — ABNORMAL HIGH (ref 0.0–0.2)

## 2023-08-26 LAB — COMPREHENSIVE METABOLIC PANEL WITH GFR
ALT: 45 U/L — ABNORMAL HIGH (ref 0–44)
AST: 29 U/L (ref 15–41)
Albumin: 2.9 g/dL — ABNORMAL LOW (ref 3.5–5.0)
Alkaline Phosphatase: 110 U/L (ref 38–126)
Anion gap: 8 (ref 5–15)
BUN: 6 mg/dL (ref 6–20)
CO2: 23 mmol/L (ref 22–32)
Calcium: 8.5 mg/dL — ABNORMAL LOW (ref 8.9–10.3)
Chloride: 104 mmol/L (ref 98–111)
Creatinine, Ser: 0.37 mg/dL — ABNORMAL LOW (ref 0.44–1.00)
GFR, Estimated: >60 mL/min (ref >60)
Glucose, Bld: 86 mg/dL (ref 70–99)
Potassium: 3.5 mmol/L (ref 3.5–5.1)
Sodium: 135 mmol/L (ref 135–145)
Total Bilirubin: 2.8 mg/dL — ABNORMAL HIGH (ref 0.0–1.2)
Total Protein: 6.9 g/dL (ref 6.5–8.1)

## 2023-08-26 LAB — PREPARE RBC (CROSSMATCH)

## 2023-08-26 MED ORDER — DIPHENHYDRAMINE HCL 50 MG/ML IJ SOLN
25.0000 mg | Freq: Once | INTRAMUSCULAR | Status: AC
  Administered 2023-08-26: 25 mg via INTRAVENOUS
  Filled 2023-08-26: qty 1

## 2023-08-26 MED ORDER — SODIUM CHLORIDE 0.9% IV SOLUTION: Freq: Once | INTRAVENOUS | Status: AC

## 2023-08-26 MED ORDER — ALPRAZOLAM 0.25 MG PO TABS
0.2500 mg | ORAL_TABLET | Freq: Once | ORAL | Status: AC
  Administered 2023-08-26: 0.25 mg via ORAL
  Filled 2023-08-26: qty 1

## 2023-08-26 NOTE — Progress Notes (Signed)
   08/26/23 2114  Assess: MEWS Score  Temp (!) 101.7 F (38.7 C)  BP 130/73  MAP (mmHg) 90  Pulse Rate (!) 114  Resp 20  SpO2 100 %  O2 Device Nasal Cannula  O2 Flow Rate (L/min) 2 L/min  Assess: MEWS Score  MEWS Temp 2  MEWS Systolic 0  MEWS Pulse 2  MEWS RR 0  MEWS LOC 0  MEWS Score 4  MEWS Score Color Red  Assess: if the MEWS score is Yellow or Red  Were vital signs accurate and taken at a resting state? Yes  Does the patient meet 2 or more of the SIRS criteria? Yes  Does the patient have a confirmed or suspected source of infection? No  MEWS guidelines implemented  Yes, red  Treat  MEWS Interventions Considered administering scheduled or prn medications/treatments as ordered  Take Vital Signs  Increase Vital Sign Frequency  Red: Q1hr x2, continue Q4hrs until patient remains green for 12hrs  Escalate  MEWS: Escalate Red: Discuss with charge nurse and notify provider. Consider notifying RRT. If remains red for 2 hours consider need for higher level of care  Notify: Charge Nurse/RN  Name of Charge Nurse/RN Notified Engineering geologist  Provider Notification  Provider Name/Title Sharion Davidson NP  Date Provider Notified 08/26/23  Time Provider Notified 2130  Method of Notification Page (secure chat)  Notification Reason Other (Comment) (RED MEWS, temp, HR)  Provider response No new orders  Date of Provider Response 08/26/23  Time of Provider Response 2139  Assess: SIRS CRITERIA  SIRS Temperature  1  SIRS Respirations  0  SIRS Pulse 1  SIRS WBC 0  SIRS Score Sum  2   Pt is now a RED MEWS d/t HR and temp. She recently finished 1 unit RBC. Pt states she is a little itchy. Pt states, "I usually get itchy when I get blood," Pts skin clear, no welts or hives and pt denies any irritation or discomfort to mouth and throat. She also has a KPAD on which is probably contributing to her temp. Removed the KPAD, lowered temp in room, pt only has one blanket on. Pt refused ice packs states she  can't tolerate the ice packs. Administered PRN Benadryl and Tylenol. Provider made aware.  9:29 PM

## 2023-08-26 NOTE — Progress Notes (Signed)
 Subjective: Bailey Reese is a 18 year old female with a medical history significant for sickle cell disease, chronic pain syndrome, and anemia of chronic disease was admitted for sickle cell pain crisis. Patient continues to have significant allover body pain especially to bilateral lower extremities.  She endorses swelling to her knees.  Patient rates pain as 8/10. She denies any chest pain, shortness of breath, urinary symptoms, nausea, vomiting, or diarrhea.  Objective:  Vital signs in last 24 hours:  Vitals:   08/26/23 0848 08/26/23 1208 08/26/23 1219 08/26/23 1226  BP:  (!) 107/57    Pulse:  86    Resp: 18 20 20 20   Temp:  98.6 F (37 C)    TempSrc:  Oral    SpO2: 99% 100% 100% 100%  Weight:      Height:        Intake/Output from previous day:   Intake/Output Summary (Last 24 hours) at 08/26/2023 1329 Last data filed at 08/26/2023 1217 Gross per 24 hour  Intake 1554.16 ml  Output 800 ml  Net 754.16 ml    Physical Exam: General: Alert, awake, oriented x3, in no acute distress.  HEENT: [place]/AT PEERL, EOMI Neck: Trachea midline,  no masses, no thyromegal,y no JVD, no carotid bruit OROPHARYNX:  Moist, No exudate/ erythema/lesions.  Heart: Regular rate and rhythm, without murmurs, rubs, gallops, PMI non-displaced, no heaves or thrills on palpation.  Lungs: Clear to auscultation, no wheezing or rhonchi noted. No increased vocal fremitus resonant to percussion  Abdomen: Soft, nontender, nondistended, positive bowel sounds, no masses no hepatosplenomegaly noted..  Neuro: No focal neurological deficits noted cranial nerves II through XII grossly intact. DTRs 2+ bilaterally upper and lower extremities. Strength 5 out of 5 in bilateral upper and lower extremities. Musculoskeletal: Left and right patella, swelling, tender to palpation, no erythema, limited ROM. Psychiatric: Patient alert and oriented x3, good insight and cognition, good recent to remote recall. Lymph node survey:  No cervical axillary or inguinal lymphadenopathy noted.  Lab Results:  Basic Metabolic Panel:    Component Value Date/Time   NA 139 08/21/2023 0835   NA 135 05/01/2013 1440   K 3.4 (L) 08/21/2023 0835   K 4.3 05/01/2013 1440   CL 108 08/21/2023 0835   CL 102 05/01/2013 1440   CO2 22 08/21/2023 0835   CO2 27 (H) 05/01/2013 1440   BUN 12 08/21/2023 0835   BUN 13 05/01/2013 1440   CREATININE 0.48 08/24/2023 0537   CREATININE 0.40 (L) 05/01/2013 1440   GLUCOSE 108 (H) 08/21/2023 0835   GLUCOSE 108 (H) 05/01/2013 1440   CALCIUM 9.2 08/21/2023 0835   CALCIUM 9.8 05/01/2013 1440   CBC:    Component Value Date/Time   WBC 14.8 (H) 08/24/2023 0537   HGB 7.2 (L) 08/24/2023 0537   HGB 6.6 (L) 05/01/2013 1440   HCT 21.4 (L) 08/24/2023 0537   HCT 20.1 (L) 05/01/2013 1440   PLT 199 08/24/2023 0537   PLT 468 (H) 05/01/2013 1440   MCV 103.4 (H) 08/24/2023 0537   MCV 89 05/01/2013 1440   NEUTROABS 10.7 (H) 08/22/2023 1718   NEUTROABS 7.9 05/30/2012 1958   LYMPHSABS 2.5 08/22/2023 1718   LYMPHSABS 5.0 05/30/2012 1958   MONOABS 3.4 (H) 08/22/2023 1718   MONOABS 1.6 (H) 05/30/2012 1958   EOSABS 0.2 08/22/2023 1718   EOSABS 0.6 05/30/2012 1958   BASOSABS 0.1 08/22/2023 1718   BASOSABS 3 04/28/2013 1736   BASOSABS 0.2 (H) 05/30/2012 1958    Recent Results (from  the past 240 hours)  Culture, blood (Routine X 2) w Reflex to ID Panel     Status: None (Preliminary result)   Collection Time: 08/22/23  5:31 PM   Specimen: BLOOD  Result Value Ref Range Status   Specimen Description   Final    BLOOD BLOOD RIGHT HAND Performed at [facility], [address], [place], [place] [address]    Special Requests   Final    BOTTLES DRAWN AEROBIC AND ANAEROBIC Blood Culture adequate volume Performed at [facility], [address], [place], [place] [address]    Culture   Final    NO GROWTH 4 DAYS Performed at [facility], [address],  [place], [place] [address]    Report Status PENDING  Incomplete  Resp panel by RT-PCR (RSV, Flu A&B, Covid) Anterior Nasal Swab     Status: None   Collection Time: 08/24/23  2:18 PM   Specimen: Anterior Nasal Swab  Result Value Ref Range Status   SARS Coronavirus 2 by RT PCR NEGATIVE NEGATIVE Final    Comment: (NOTE) SARS-CoV-2 target nucleic acids are NOT DETECTED.  The SARS-CoV-2 RNA is generally detectable in upper respiratory specimens during the acute phase of infection. The lowest concentration of SARS-CoV-2 viral copies this assay can detect is 138 copies/mL. A negative result does not preclude SARS-Cov-2 infection and should not be used as the sole basis for treatment or other patient management decisions. A negative result may occur with  improper specimen collection/handling, submission of specimen other than nasopharyngeal swab, presence of viral mutation(s) within the areas targeted by this assay, and inadequate number of viral copies(<138 copies/mL). A negative result must be combined with clinical observations, patient history, and epidemiological information. The expected result is Negative.  Fact Sheet for Patients:  BloggerCourse.com  Fact Sheet for Healthcare Providers:  SeriousBroker.it  This test is no t yet approved or cleared by the [place]  FDA and  has been authorized for detection and/or diagnosis of SARS-CoV-2 by FDA under an Emergency Use Authorization (EUA). This EUA will remain  in effect (meaning this test can be used) for the duration of the COVID-19 declaration under Section 564(b)(1) of the Act, 21 U.S.C.section 360bbb-3(b)(1), unless the authorization is terminated  or revoked sooner.       Influenza A by PCR NEGATIVE NEGATIVE Final   Influenza B by PCR NEGATIVE NEGATIVE Final    Comment: (NOTE) The Xpert Xpress SARS-CoV-2/FLU/RSV plus assay is intended as an aid in the diagnosis of influenza  from Nasopharyngeal swab specimens and should not be used as a sole basis for treatment. Nasal washings and aspirates are unacceptable for Xpert Xpress SARS-CoV-2/FLU/RSV testing.  Fact Sheet for Patients: BloggerCourse.com  Fact Sheet for Healthcare Providers: SeriousBroker.it  This test is not yet approved or cleared by the [place]  FDA and has been authorized for detection and/or diagnosis of SARS-CoV-2 by FDA under an Emergency Use Authorization (EUA). This EUA will remain in effect (meaning this test can be used) for the duration of the COVID-19 declaration under Section 564(b)(1) of the Act, 21 U.S.C. section 360bbb-3(b)(1), unless the authorization is terminated or revoked.     Resp Syncytial Virus by PCR NEGATIVE NEGATIVE Final    Comment: (NOTE) Fact Sheet for Patients: BloggerCourse.com  Fact Sheet for Healthcare Providers: SeriousBroker.it  This test is not yet approved or cleared by the [place]  FDA and has been authorized for detection and/or diagnosis of SARS-CoV-2 by FDA  under an Emergency Use Authorization (EUA). This EUA will remain in effect (meaning this test can be used) for the duration of the COVID-19 declaration under Section 564(b)(1) of the Act, 21 U.S.C. section 360bbb-3(b)(1), unless the authorization is terminated or revoked.  Performed at [facility], [address] [place], [place] [address]     Studies/Results: No results found.  Medications: Scheduled Meds:  enoxaparin (LOVENOX) injection  40 mg Subcutaneous Q24H   guaiFENesin  600 mg Oral BID   HYDROmorphone   Intravenous Q4H   hydroxyurea  500 mg Oral Daily   lubiprostone  8 mcg Oral BID WC   pantoprazole  40 mg Oral Daily   pregabalin  75 mg Oral TID   senna-docusate  1 tablet Oral BID   sodium chloride flush  10-40 mL Intracatheter Q12H   sucralfate   1 g Oral BID   Continuous Infusions:  ceFEPime (MAXIPIME) IV 2 g (08/26/23 0624)   vancomycin 1,000 mg (08/26/23 0358)   PRN Meds:.acetaminophen, diphenhydrAMINE, levalbuterol, naloxone **AND** sodium chloride flush, ondansetron (ZOFRAN) IV, mouth rinse, oxyCODONE, polyethylene glycol, prazosin  Consultants: none  Procedures: none  Antibiotics: none  Assessment/Plan: Principal Problem:   Sickle cell anemia with pain (HCC) Active Problems:   Chronic pain syndrome   Leucocytosis  Sickle cell disease with pain crisis: Patient has not had any improvement in pain.  Will continue IV Dilaudid PCA without changes today. Patient encouraged to request oxycodone 10 mg every 4 hours as needed for severe breakthrough pain. Toradol 15 mg IV every 6 hours for total of 5 days. Continue to monitor vital signs very closely, reevaluate pain scale regularly, and supplemental oxygen as needed.  SIRS:  Patient has been afebrile over the past 24 hours.  Heart rate stabilized.  Maintaining oxygen saturation over 90% on 2 L supplemental.  Blood cultures negative thus far.  Chest x-ray shows no acute cardio pulmonary process.  Will consider de-escalating antibiotics in a.m.  Chronic pain syndrome: Continue home medications  Leukocytosis: Will continue antibiotics today.  Awaiting lab results.  Anemia of chronic disease: Last hemoglobin on 08/24/2023 showed 7.2 g/dL.  Awaiting lab results, will review as they become available.   Code Status: Full Code Family Communication: N/A Disposition Plan: Not yet ready for discharge  Corky Diener  DNP, APRN, FNP-C [facility] [address] [place], [place] [address] [phone]  If 7PM-7AM, please contact night-coverage.  08/26/2023, 1:29 PM  LOS: 5 days

## 2023-08-26 NOTE — Plan of Care (Signed)
  Problem: Education: Goal: Knowledge of General Education information will improve Description: Including pain rating scale, medication(s)/side effects and non-pharmacologic comfort measures Outcome: Progressing   Problem: Health Behavior/Discharge Planning: Goal: Ability to manage health-related needs will improve Outcome: Progressing   Problem: Clinical Measurements: Goal: Ability to maintain clinical measurements within normal limits will improve Outcome: Progressing Goal: Will remain free from infection Outcome: Progressing Goal: Diagnostic test results will improve Outcome: Progressing Goal: Respiratory complications will improve Outcome: Progressing Goal: Cardiovascular complication will be avoided Outcome: Progressing   Problem: Activity: Goal: Risk for activity intolerance will decrease Outcome: Progressing   Problem: Nutrition: Goal: Adequate nutrition will be maintained Outcome: Progressing   Problem: Coping: Goal: Level of anxiety will decrease Outcome: Progressing   Problem: Elimination: Goal: Will not experience complications related to bowel motility Outcome: Progressing Goal: Will not experience complications related to urinary retention Outcome: Progressing   Problem: Pain Managment: Goal: General experience of comfort will improve and/or be controlled Outcome: Progressing   Problem: Safety: Goal: Ability to remain free from injury will improve Outcome: Progressing   Problem: Skin Integrity: Goal: Risk for impaired skin integrity will decrease Outcome: Progressing   Problem: Self-Care: Goal: Ability to incorporate actions that prevent/reduce pain crisis will improve Outcome: Progressing   Problem: Tissue Perfusion: Goal: Complications related to inadequate tissue perfusion will be avoided or minimized Outcome: Progressing   Problem: Respiratory: Goal: Pulmonary complications will be avoided or minimized Outcome: Progressing Goal: Acute  Chest Syndrome will be identified early to prevent complications Outcome: Progressing   Problem: Fluid Volume: Goal: Ability to maintain a balanced intake and output will improve Outcome: Progressing   Problem: Sensory: Goal: Pain level will decrease with appropriate interventions Outcome: Progressing   Problem: Health Behavior: Goal: Postive changes in compliance with treatment and prescription regimens will improve Outcome: Progressing

## 2023-08-26 NOTE — Progress Notes (Signed)
 A consult was placed to IV Therapy for more access; pt getting blood, is on PCA, and has IV antibiotics; Pt had a midline placed 08/25/23;   pt assessed by 2 more IV Team RNs today;  no suitable access noted; suggest PICC line as pt is a very difficult stick. Please advise.  Thank you.

## 2023-08-26 NOTE — Progress Notes (Incomplete)
   08/26/23 2114  Assess: MEWS Score  Temp (!) 101.7 F (38.7 C)  BP 130/73  MAP (mmHg) 90  Pulse Rate (!) 114  Resp 20  SpO2 100 %  O2 Device Nasal Cannula  O2 Flow Rate (L/min) 2 L/min  Assess: MEWS Score  MEWS Temp 2  MEWS Systolic 0  MEWS Pulse 2  MEWS RR 0  MEWS LOC 0  MEWS Score 4  MEWS Score Color Red  Assess: if the MEWS score is Yellow or Red  Were vital signs accurate and taken at a resting state? Yes  Does the patient meet 2 or more of the SIRS criteria? Yes  Does the patient have a confirmed or suspected source of infection? No  MEWS guidelines implemented  Yes, red  Treat  MEWS Interventions Considered administering scheduled or prn medications/treatments as ordered  Take Vital Signs  Increase Vital Sign Frequency  Red: Q1hr x2, continue Q4hrs until patient remains green for 12hrs  Escalate  MEWS: Escalate Red: Discuss with charge nurse and notify provider. Consider notifying RRT. If remains red for 2 hours consider need for higher level of care  Notify: Charge Nurse/RN  Name of Charge Nurse/RN Notified Engineering geologist  Provider Notification  Provider Name/Title Sharion Davidson NP  Date Provider Notified 08/26/23  Time Provider Notified 2130  Method of Notification Page (secure chat)  Notification Reason Other (Comment) (RED MEWS, temp, HR)  Provider response No new orders  Date of Provider Response 08/26/23  Time of Provider Response 2139  Assess: SIRS CRITERIA  SIRS Temperature  1  SIRS Respirations  0  SIRS Pulse 1  SIRS WBC 0  SIRS Score Sum  2   Pt is now a RED MEWS d/t HR and temp. She recently finished 1 unit RBC. Pt states she is a little itchy. Pt states, "I usually gets itchy with blood," Pts skin clear, no welts or hives and pt denies any irritation or discomfort to throat. She also has a KPAD on which is probably contributing to her temp. Removed the KPAD, lowered temp in room, pt only has one blanket on I'm going to give her PRN Benadryl and  Tylenol.  9:29 PM

## 2023-08-26 NOTE — Plan of Care (Signed)
  Problem: Nutrition: Goal: Adequate nutrition will be maintained Outcome: Progressing   Problem: Safety: Goal: Ability to remain free from injury will improve Outcome: Progressing   Problem: Skin Integrity: Goal: Risk for impaired skin integrity will decrease Outcome: Progressing   Problem: Bowel/Gastric: Goal: Gut motility will be maintained Outcome: Progressing   Problem: Tissue Perfusion: Goal: Complications related to inadequate tissue perfusion will be avoided or minimized Outcome: Progressing   Problem: Respiratory: Goal: Pulmonary complications will be avoided or minimized Outcome: Progressing Goal: Acute Chest Syndrome will be identified early to prevent complications Outcome: Progressing   Problem: Fluid Volume: Goal: Ability to maintain a balanced intake and output will improve Outcome: Progressing   Problem: Sensory: Goal: Pain level will decrease with appropriate interventions Outcome: Not Progressing

## 2023-08-27 DIAGNOSIS — D5700 Hb-SS disease with crisis, unspecified: Secondary | ICD-10-CM | POA: Diagnosis not present

## 2023-08-27 LAB — CULTURE, BLOOD (ROUTINE X 2)
Culture: NO GROWTH
Special Requests: ADEQUATE

## 2023-08-27 LAB — HEMOGLOBIN AND HEMATOCRIT, BLOOD
HCT: 22.3 % — ABNORMAL LOW (ref 36.0–46.0)
Hemoglobin: 7.7 g/dL — ABNORMAL LOW (ref 12.0–15.0)

## 2023-08-27 MED ORDER — SALINE SPRAY 0.65 % NA SOLN: 1.0000 | NASAL | Status: DC | PRN

## 2023-08-27 NOTE — Plan of Care (Signed)
  Problem: Clinical Measurements: Goal: Diagnostic test results will improve 08/27/2023 0718 by Marlaine Silos, RN Outcome: Progressing 08/27/2023 0717 by Marlaine Silos, RN Outcome: Progressing Goal: Respiratory complications will improve 08/27/2023 0718 by Marlaine Silos, RN Outcome: Progressing 08/27/2023 0717 by Marlaine Silos, RN Outcome: Progressing Goal: Cardiovascular complication will be avoided Outcome: Progressing   Problem: Pain Managment: Goal: General experience of comfort will improve and/or be controlled Outcome: Progressing   Problem: Safety: Goal: Ability to remain free from injury will improve Outcome: Progressing

## 2023-08-27 NOTE — Progress Notes (Cosign Needed Addendum)
 Subjective: Bailey Reese is a 18 year old female with a medical history significant for sickle cell disease, chronic pain syndrome, and anemia of chronic disease was admitted for sickle cell pain crisis.  Patient continues to have significant allover body pain especially to bilateral lower extremities.  She endorses swelling to her knees.  Patient rates pain as 8/10.  Patient endorses productive cough. She denies any chest pain, shortness of breath, urinary symptoms, nausea, vomiting, or diarrhea.  Objective:  Vital signs in last 24 hours:  Vitals:   08/27/23 0735 08/27/23 0746 08/27/23 1036 08/27/23 1157  BP:  132/75 (!) 133/119 125/63  Pulse:  (!) 109 (!) 103 93  Resp: 18 20 20    Temp:  99 F (37.2 C) 99.2 F (37.3 C)   TempSrc:  Oral Oral   SpO2: 100% 97% 99%   Weight:      Height:        Intake/Output from previous day:   Intake/Output Summary (Last 24 hours) at 08/27/2023 1316 Last data filed at 08/27/2023 0600 Gross per 24 hour  Intake 1456.31 ml  Output --  Net 1456.31 ml    Physical Exam: General: Alert, awake, oriented x3, in no acute distress.  HEENT: [place]/AT PEERL, EOMI Neck: Trachea midline,  no masses, no thyromegal,y no JVD, no carotid bruit OROPHARYNX:  Moist, No exudate/ erythema/lesions.  Heart: Regular rate and rhythm, without murmurs, rubs, gallops, PMI non-displaced, no heaves or thrills on palpation.  Lungs: Clear to auscultation, no wheezing or rhonchi noted. No increased vocal fremitus resonant to percussion  Abdomen: Soft, nontender, nondistended, positive bowel sounds, no masses no hepatosplenomegaly noted..  Neuro: No focal neurological deficits noted cranial nerves II through XII grossly intact. DTRs 2+ bilaterally upper and lower extremities. Strength 5 out of 5 in bilateral upper and lower extremities. Musculoskeletal: Left and right patella, swelling, tender to palpation, no erythema, limited ROM. Psychiatric: Patient alert and oriented x3,  good insight and cognition, good recent to remote recall. Lymph node survey: No cervical axillary or inguinal lymphadenopathy noted.  Lab Results:  Basic Metabolic Panel:    Component Value Date/Time   NA 135 08/26/2023 1400   NA 135 05/01/2013 1440   K 3.5 08/26/2023 1400   K 4.3 05/01/2013 1440   CL 104 08/26/2023 1400   CL 102 05/01/2013 1440   CO2 23 08/26/2023 1400   CO2 27 (H) 05/01/2013 1440   BUN 6 08/26/2023 1400   BUN 13 05/01/2013 1440   CREATININE 0.37 (L) 08/26/2023 1400   CREATININE 0.40 (L) 05/01/2013 1440   GLUCOSE 86 08/26/2023 1400   GLUCOSE 108 (H) 05/01/2013 1440   CALCIUM 8.5 (L) 08/26/2023 1400   CALCIUM 9.8 05/01/2013 1440   CBC:    Component Value Date/Time   WBC 11.5 (H) 08/26/2023 1400   HGB 7.7 (L) 08/27/2023 0517   HGB 6.6 (L) 05/01/2013 1440   HCT 22.3 (L) 08/27/2023 0517   HCT 20.1 (L) 05/01/2013 1440   PLT 233 08/26/2023 1400   PLT 468 (H) 05/01/2013 1440   MCV 97.9 08/26/2023 1400   MCV 89 05/01/2013 1440   NEUTROABS 9.0 (H) 08/26/2023 1400   NEUTROABS 7.9 05/30/2012 1958   LYMPHSABS 1.1 08/26/2023 1400   LYMPHSABS 5.0 05/30/2012 1958   MONOABS 1.0 08/26/2023 1400   MONOABS 1.6 (H) 05/30/2012 1958   EOSABS 0.4 08/26/2023 1400   EOSABS 0.6 05/30/2012 1958   BASOSABS 0.1 08/26/2023 1400   BASOSABS 3 04/28/2013 1736   BASOSABS 0.2 (H)  05/30/2012 1958    Recent Results (from the past 240 hours)  Culture, blood (Routine X 2) w Reflex to ID Panel     Status: None   Collection Time: 08/22/23  5:31 PM   Specimen: BLOOD  Result Value Ref Range Status   Specimen Description   Final    BLOOD BLOOD RIGHT HAND Performed at [facility], [address] [place], [place] [address]    Special Requests   Final    BOTTLES DRAWN AEROBIC AND ANAEROBIC Blood Culture adequate volume Performed at [facility], [address], [place], [place] [address]    Culture   Final    NO GROWTH 5 DAYS Performed at  [facility], [address], [place], [place] [address]    Report Status 08/27/2023 FINAL  Final  Resp panel by RT-PCR (RSV, Flu A&B, Covid) Anterior Nasal Swab     Status: None   Collection Time: 08/24/23  2:18 PM   Specimen: Anterior Nasal Swab  Result Value Ref Range Status   SARS Coronavirus 2 by RT PCR NEGATIVE NEGATIVE Final    Comment: (NOTE) SARS-CoV-2 target nucleic acids are NOT DETECTED.  The SARS-CoV-2 RNA is generally detectable in upper respiratory specimens during the acute phase of infection. The lowest concentration of SARS-CoV-2 viral copies this assay can detect is 138 copies/mL. A negative result does not preclude SARS-Cov-2 infection and should not be used as the sole basis for treatment or other patient management decisions. A negative result may occur with  improper specimen collection/handling, submission of specimen other than nasopharyngeal swab, presence of viral mutation(s) within the areas targeted by this assay, and inadequate number of viral copies(<138 copies/mL). A negative result must be combined with clinical observations, patient history, and epidemiological information. The expected result is Negative.  Fact Sheet for Patients:  BloggerCourse.com  Fact Sheet for Healthcare Providers:  SeriousBroker.it  This test is no t yet approved or cleared by the [place]  FDA and  has been authorized for detection and/or diagnosis of SARS-CoV-2 by FDA under an Emergency Use Authorization (EUA). This EUA will remain  in effect (meaning this test can be used) for the duration of the COVID-19 declaration under Section 564(b)(1) of the Act, 21 U.S.C.section 360bbb-3(b)(1), unless the authorization is terminated  or revoked sooner.       Influenza A by PCR NEGATIVE NEGATIVE Final   Influenza B by PCR NEGATIVE NEGATIVE Final    Comment: (NOTE) The Xpert Xpress SARS-CoV-2/FLU/RSV plus assay is  intended as an aid in the diagnosis of influenza from Nasopharyngeal swab specimens and should not be used as a sole basis for treatment. Nasal washings and aspirates are unacceptable for Xpert Xpress SARS-CoV-2/FLU/RSV testing.  Fact Sheet for Patients: BloggerCourse.com  Fact Sheet for Healthcare Providers: SeriousBroker.it  This test is not yet approved or cleared by the [place]  FDA and has been authorized for detection and/or diagnosis of SARS-CoV-2 by FDA under an Emergency Use Authorization (EUA). This EUA will remain in effect (meaning this test can be used) for the duration of the COVID-19 declaration under Section 564(b)(1) of the Act, 21 U.S.C. section 360bbb-3(b)(1), unless the authorization is terminated or revoked.     Resp Syncytial Virus by PCR NEGATIVE NEGATIVE Final    Comment: (NOTE) Fact Sheet for Patients: BloggerCourse.com  Fact Sheet for Healthcare Providers: SeriousBroker.it  This test is not yet approved or cleared by the [place]  FDA and has been authorized for  detection and/or diagnosis of SARS-CoV-2 by FDA under an Emergency Use Authorization (EUA). This EUA will remain in effect (meaning this test can be used) for the duration of the COVID-19 declaration under Section 564(b)(1) of the Act, 21 U.S.C. section 360bbb-3(b)(1), unless the authorization is terminated or revoked.  Performed at [facility], [address] [place], [place] [address]     Studies/Results: No results found.  Medications: Scheduled Meds:  enoxaparin (LOVENOX) injection  40 mg Subcutaneous Q24H   guaiFENesin  600 mg Oral BID   HYDROmorphone   Intravenous Q4H   hydroxyurea  500 mg Oral Daily   lubiprostone  8 mcg Oral BID WC   pantoprazole  40 mg Oral Daily   pregabalin  75 mg Oral TID   senna-docusate  1 tablet Oral BID   sodium chloride  flush  10-40 mL Intracatheter Q12H   sucralfate  1 g Oral BID   Continuous Infusions:  ceFEPime (MAXIPIME) IV 2 g (08/27/23 0653)   vancomycin 1,000 mg (08/27/23 0510)   PRN Meds:.acetaminophen, diphenhydrAMINE, levalbuterol, naloxone **AND** sodium chloride flush, ondansetron (ZOFRAN) IV, mouth rinse, oxyCODONE, polyethylene glycol, prazosin, sodium chloride  Consultants: none  Procedures: none  Antibiotics: Cefepime Vancomycin  Assessment/Plan: Principal Problem:   Sickle cell anemia with pain (HCC) Active Problems:   Chronic pain syndrome   Leucocytosis  Sickle cell disease with pain crisis: Patient has not had any improvement in pain.  Will continue IV Dilaudid PCA without changes today. Patient encouraged to request oxycodone 10 mg every 4 hours as needed for severe breakthrough pain. Patient completed IV Toradol.  Ibuprofen 800 mg every 8 hours as needed for mild to moderate pain. Continue to monitor vital signs very closely, reevaluate pain scale regularly, and supplemental oxygen as needed.  SIRS:  Patient has been afebrile over the past 24 hours.  Heart rate stabilized.  Maintaining oxygen saturation over 90% on 2 L supplemental.  Blood cultures negative thus far.  Will discontinue vancomycin today.  Chronic pain syndrome: Continue home medications  Leukocytosis: Will continue antibiotics today.  Awaiting lab results.  Anemia of chronic disease: On yesterday, hemoglobin 6.3 g/dL.  Patient transfused 1 unit PRBCs.  Awaiting lab results, will review as they become available. Will continue to follow labs daily.   Code Status: Full Code Family Communication: N/A Disposition Plan: Not yet ready for discharge  Corky Diener  DNP, APRN, FNP-C [facility] [address] [place], [place] [address] [phone]  If 7PM-7AM, please contact night-coverage.  08/27/2023, 1:16 PM  LOS: 6 days

## 2023-08-28 LAB — BASIC METABOLIC PANEL WITH GFR
Anion gap: 10 (ref 5–15)
BUN: 7 mg/dL (ref 6–20)
CO2: 20 mmol/L — ABNORMAL LOW (ref 22–32)
Calcium: 8.7 mg/dL — ABNORMAL LOW (ref 8.9–10.3)
Chloride: 107 mmol/L (ref 98–111)
Creatinine, Ser: 0.5 mg/dL (ref 0.44–1.00)
GFR, Estimated: >60 mL/min (ref >60)
Glucose, Bld: 95 mg/dL (ref 70–99)
Potassium: 3.6 mmol/L (ref 3.5–5.1)
Sodium: 137 mmol/L (ref 135–145)

## 2023-08-28 LAB — TYPE AND SCREEN
ABO/RH(D): B POS
Antibody Screen: NEGATIVE
Unit division: 0

## 2023-08-28 LAB — BPAM RBC
Blood Product Expiration Date: 202505092359
ISSUE DATE / TIME: 202504121643
Unit Type and Rh: 9500

## 2023-08-28 LAB — CBC
HCT: 23.2 % — ABNORMAL LOW (ref 36.0–46.0)
Hemoglobin: 7.6 g/dL — ABNORMAL LOW (ref 12.0–15.0)
MCH: 32.3 pg (ref 26.0–34.0)
MCHC: 32.8 g/dL (ref 30.0–36.0)
MCV: 98.7 fL (ref 80.0–100.0)
Platelets: 341 10*3/uL (ref 150–400)
RBC: 2.35 MIL/uL — ABNORMAL LOW (ref 3.87–5.11)
RDW: 18.2 % — ABNORMAL HIGH (ref 11.5–15.5)
WBC: 11.3 10*3/uL — ABNORMAL HIGH (ref 4.0–10.5)
nRBC: 2.6 % — ABNORMAL HIGH (ref 0.0–0.2)

## 2023-08-28 NOTE — Plan of Care (Signed)

## 2023-08-28 NOTE — Plan of Care (Signed)

## 2023-08-28 NOTE — Progress Notes (Signed)
 Patient ID: Bailey Reese, female   DOB: 10-23-2005, 18 y.o.   MRN: [id number] Subjective: Bailey Reese is a 18 year old female with a medical history significant for sickle cell disease, chronic pain syndrome, and anemia of chronic disease was admitted for sickle cell pain crisis.   Patient is lying in bed wide awake she continues to have significant allover body pain especially to bilateral lower extremities. Worse pain in her knees.  Patient rates pain as 8/10.  Patient endorses productive cough and reports improvement with mucinex.  She denies any chest pain, shortness of breath, urinary symptoms, nausea, vomiting, Fever or diarrhea.  Objective:  Vital signs in last 24 hours:  Vitals:   08/28/23 0742 08/28/23 0953 08/28/23 1002 08/28/23 1154  BP:   (!) 106/55   Pulse:   90   Resp: 19 (!) 21 17 16   Temp:   98.6 F (37 C)   TempSrc:   Oral   SpO2:   95%   Weight:      Height:        Intake/Output from previous day:   Intake/Output Summary (Last 24 hours) at 08/28/2023 1253 Last data filed at 08/28/2023 1004 Gross per 24 hour  Intake 1188 ml  Output 0 ml  Net 1188 ml    Physical Exam: General: Alert, awake, oriented x3, in no acute distress.  HEENT: [place]/AT PEERL, EOMI Neck: Trachea midline,  no masses, no thyromegal,y no JVD, no carotid bruit OROPHARYNX:  Moist, No exudate/ erythema/lesions.  Heart: Regular rate and rhythm, without murmurs, rubs, gallops, PMI non-displaced, no heaves or thrills on palpation.  Lungs: Clear to auscultation, no wheezing or rhonchi noted. No increased vocal fremitus resonant to percussion  Abdomen: Soft, nontender, nondistended, positive bowel sounds, no masses no hepatosplenomegaly noted..  Neuro: No focal neurological deficits noted cranial nerves II through XII grossly intact. DTRs 2+ bilaterally upper and lower extremities. Strength 5 out of 5 in bilateral upper and lower extremities. Musculoskeletal:Bilateral lower extremity pain   Psychiatric: Patient alert and oriented x3, good insight and cognition, good recent to remote recall. Lymph node survey: No cervical axillary or inguinal lymphadenopathy noted.  Lab Results:  Basic Metabolic Panel:    Component Value Date/Time   NA 137 08/28/2023 0530   NA 135 05/01/2013 1440   K 3.6 08/28/2023 0530   K 4.3 05/01/2013 1440   CL 107 08/28/2023 0530   CL 102 05/01/2013 1440   CO2 20 (L) 08/28/2023 0530   CO2 27 (H) 05/01/2013 1440   BUN 7 08/28/2023 0530   BUN 13 05/01/2013 1440   CREATININE 0.50 08/28/2023 0530   CREATININE 0.40 (L) 05/01/2013 1440   GLUCOSE 95 08/28/2023 0530   GLUCOSE 108 (H) 05/01/2013 1440   CALCIUM 8.7 (L) 08/28/2023 0530   CALCIUM 9.8 05/01/2013 1440   CBC:    Component Value Date/Time   WBC 11.3 (H) 08/28/2023 0530   HGB 7.6 (L) 08/28/2023 0530   HGB 6.6 (L) 05/01/2013 1440   HCT 23.2 (L) 08/28/2023 0530   HCT 20.1 (L) 05/01/2013 1440   PLT 341 08/28/2023 0530   PLT 468 (H) 05/01/2013 1440   MCV 98.7 08/28/2023 0530   MCV 89 05/01/2013 1440   NEUTROABS 9.0 (H) 08/26/2023 1400   NEUTROABS 7.9 05/30/2012 1958   LYMPHSABS 1.1 08/26/2023 1400   LYMPHSABS 5.0 05/30/2012 1958   MONOABS 1.0 08/26/2023 1400   MONOABS 1.6 (H) 05/30/2012 1958   EOSABS 0.4 08/26/2023 1400   EOSABS 0.6 05/30/2012  1958   BASOSABS 0.1 08/26/2023 1400   BASOSABS 3 04/28/2013 1736   BASOSABS 0.2 (H) 05/30/2012 1958    Recent Results (from the past 240 hours)  Culture, blood (Routine X 2) w Reflex to ID Panel     Status: None   Collection Time: 08/22/23  5:31 PM   Specimen: BLOOD  Result Value Ref Range Status   Specimen Description   Final    BLOOD BLOOD RIGHT HAND Performed at [facility], [address] [place], [place] [address]    Special Requests   Final    BOTTLES DRAWN AEROBIC AND ANAEROBIC Blood Culture adequate volume Performed at [facility], [address], [place], [place] [address]    Culture    Final    NO GROWTH 5 DAYS Performed at [facility], [address] [place], [place] [address]    Report Status 08/27/2023 FINAL  Final  Resp panel by RT-PCR (RSV, Flu A&B, Covid) Anterior Nasal Swab     Status: None   Collection Time: 08/24/23  2:18 PM   Specimen: Anterior Nasal Swab  Result Value Ref Range Status   SARS Coronavirus 2 by RT PCR NEGATIVE NEGATIVE Final    Comment: (NOTE) SARS-CoV-2 target nucleic acids are NOT DETECTED.  The SARS-CoV-2 RNA is generally detectable in upper respiratory specimens during the acute phase of infection. The lowest concentration of SARS-CoV-2 viral copies this assay can detect is 138 copies/mL. A negative result does not preclude SARS-Cov-2 infection and should not be used as the sole basis for treatment or other patient management decisions. A negative result may occur with  improper specimen collection/handling, submission of specimen other than nasopharyngeal swab, presence of viral mutation(s) within the areas targeted by this assay, and inadequate number of viral copies(<138 copies/mL). A negative result must be combined with clinical observations, patient history, and epidemiological information. The expected result is Negative.  Fact Sheet for Patients:  BloggerCourse.com  Fact Sheet for Healthcare Providers:  SeriousBroker.it  This test is no t yet approved or cleared by the [place] FDA and  has been authorized for detection and/or diagnosis of SARS-CoV-2 by FDA under an Emergency Use Authorization (EUA). This EUA will remain  in effect (meaning this test can be used) for the duration of the COVID-19 declaration under Section 564(b)(1) of the Act, 21 U.S.C.section 360bbb-3(b)(1), unless the authorization is terminated  or revoked sooner.       Influenza A by PCR NEGATIVE NEGATIVE Final   Influenza B by PCR NEGATIVE NEGATIVE Final    Comment: (NOTE) The Xpert  Xpress SARS-CoV-2/FLU/RSV plus assay is intended as an aid in the diagnosis of influenza from Nasopharyngeal swab specimens and should not be used as a sole basis for treatment. Nasal washings and aspirates are unacceptable for Xpert Xpress SARS-CoV-2/FLU/RSV testing.  Fact Sheet for Patients: BloggerCourse.com  Fact Sheet for Healthcare Providers: SeriousBroker.it  This test is not yet approved or cleared by the [place] FDA and has been authorized for detection and/or diagnosis of SARS-CoV-2 by FDA under an Emergency Use Authorization (EUA). This EUA will remain in effect (meaning this test can be used) for the duration of the COVID-19 declaration under Section 564(b)(1) of the Act, 21 U.S.C. section 360bbb-3(b)(1), unless the authorization is terminated or revoked.     Resp Syncytial Virus by PCR NEGATIVE NEGATIVE Final    Comment: (NOTE) Fact Sheet for Patients: BloggerCourse.com  Fact Sheet for Healthcare Providers: SeriousBroker.it  This test is not yet approved or cleared by the [place]  FDA and has been authorized for detection and/or diagnosis of SARS-CoV-2 by FDA under an Emergency Use Authorization (EUA). This EUA will remain in effect (meaning this test can be used) for the duration of the COVID-19 declaration under Section 564(b)(1) of the Act, 21 U.S.C. section 360bbb-3(b)(1), unless the authorization is terminated or revoked.  Performed at [facility], [address] [place], [place] [address]     Studies/Results: No results found.  Medications: Scheduled Meds:  enoxaparin (LOVENOX) injection  40 mg Subcutaneous Q24H   guaiFENesin  600 mg Oral BID   HYDROmorphone   Intravenous Q4H   hydroxyurea  500 mg Oral Daily   lubiprostone  8 mcg Oral BID WC   pantoprazole  40 mg Oral Daily   pregabalin  75 mg Oral TID    senna-docusate  1 tablet Oral BID   sodium chloride flush  10-40 mL Intracatheter Q12H   sucralfate  1 g Oral BID   Continuous Infusions:  ceFEPime (MAXIPIME) IV 2 g (08/28/23 0500)   PRN Meds:.acetaminophen, diphenhydrAMINE, levalbuterol, naloxone **AND** sodium chloride flush, ondansetron (ZOFRAN) IV, mouth rinse, oxyCODONE, polyethylene glycol, prazosin, sodium chloride  Consultants: None  Procedures: None  Antibiotics: Vancomycin  Cefepime  Assessment/Plan: Principal Problem:   Sickle cell anemia with pain (HCC) Active Problems:   Chronic pain syndrome   Leucocytosis   Hb Sickle Cell Disease with Pain crisis: Continue IVF 0.45% Saline @ kvo mls/hour, continue weight based Dilaudid PCA, IV Toradol 15 mg Q 6 H for a total of 5 days, continue oral home pain medications as ordered. Monitor vitals very closely, Re-evaluate pain scale regularly, 2 L of Oxygen by [place]. Patient encouraged to ambulate on the hallway today.  Leukocytosis: Continue antibiotics Anemia of Chronic Disease: Patient is doing better after 1 unit of PRBC. HGB is 7.6 from 6.3 Chronic pain Syndrome: Continue oral home medication    Code Status: Full Code Family Communication: N/A Disposition Plan: Not yet ready for discharge  Lorel Roes NP  If 7PM-7AM, please contact night-coverage.  08/28/2023, 12:53 PM  LOS: 7 days

## 2023-08-29 MED ORDER — ESOMEPRAZOLE MAGNESIUM 40 MG PO CPDR: 40.0000 mg | DELAYED_RELEASE_CAPSULE | Freq: Every day | ORAL | 0 refills | Status: AC | PRN

## 2023-08-29 NOTE — Plan of Care (Signed)

## 2023-08-29 NOTE — Discharge Summary (Signed)
 Physician Discharge Summary  ASHBY LEFLORE ZOX:[id number] DOB: 19-Jun-2005 DOA: 08/21/2023  PCP: Gladys Lamp, MD  Admit date: 08/21/2023  Discharge date: 08/29/2023  Discharge Diagnoses:  Principal Problem:   Sickle cell anemia with pain [facility]) Active Problems:   Chronic pain syndrome   Leucocytosis   Discharge Condition: Stable  Disposition:  Pt is discharged home in good condition and is to follow up with Little, Ozell Blunt, MD this week to have labs evaluated. Bailey Reese is instructed to increase activity slowly and balance with rest for the next few days, and use prescribed medication to complete treatment of pain  Diet: Regular Wt Readings from Last 3 Encounters:  08/23/23 65.7 kg (76%, Z= 0.71)*  08/21/23 65.8 kg (76%, Z= 0.71)*  07/04/23 61.7 kg (65%, Z= 0.39)*   * Growth percentiles are based on CDC (Girls, 2-20 Years) data.    History of present illness:  Bailey Reese  is a 18 y.o. female with medical history significant of sickle cell disease, PTSD, depression with anxiety who usually gets her care at [facility] but was seen at [facility] for the last 24 hours where she stayed in the ER with sickle cell pain crisis.  Patient was managed in the ER for crisis however her pain was not controlled. Patient was transferred to our care for continued sickle cell crisis pain control. Patient reported to have pain similar to her typical sickle cell crisis.Pain is mainly in her legs and back.  She has vitals that are stable.  Hemoglobin is around 7.4 slightly lower than her base line.  She was getting IV Dilaudid pushes in the ER.  Patient denied any nausea vomiting or diarrhea.  Denied any viral illness.  She has had previous osteomyelitis and multiple episodes of acute chest syndromes.  Also hemorrhagic stroke in 2017.  Patient has chronic pain from her sickle cell disease.  She has been admitted for sickle cell pain management.   ED Course:  BP,  141/80, Pulse rate 120, Resp 20, Temperature 98.4(36.9), SPO2 100%, weight 145 lb, hight 5'4' (1.626 m), pain score 10/10, HGB 7.8, Potassium 3.4, WBC 15.5, HCT 21.2, Retic count 314.0. Patient was treated for pain with dilaudid and hydration. Patients pain was persistent and unrelenting. Patient admitted for ongoing sickle cell crisis pain management.  Labs Reviewed  CBC - Abnormal; Notable for the following components:      Result Value   WBC 18.9 (*)    RBC 1.97 (*)    Hemoglobin 7.4 (*)    HCT 21.3 (*)    MCV 108.1 (*)    MCH 37.6 (*)    RDW 21.1 (*)    nRBC 7.6 (*)    All other components within normal limits  URINALYSIS, COMPLETE (UACMP) WITH MICROSCOPIC - Abnormal; Notable for the following components:   Color, Urine AMBER (*)    APPearance HAZY (*)    Bacteria, UA MANY (*)    All other components within normal limits  CBC - Abnormal; Notable for the following components:   WBC 16.3 (*)    RBC 2.30 (*)    Hemoglobin 8.1 (*)    HCT 23.7 (*)    MCV 103.0 (*)    MCH 35.2 (*)    RDW 20.7 (*)    nRBC 7.4 (*)    All other components within normal limits  CBC WITH DIFFERENTIAL/PLATELET - Abnormal; Notable for the following components:   WBC 17.1 (*)    RBC  1.79 (*)    Hemoglobin 6.7 (*)    HCT 19.0 (*)    MCV 106.1 (*)    MCH 37.4 (*)    RDW 20.5 (*)    nRBC 7.0 (*)    Neutro Abs 10.7 (*)    Monocytes Absolute 3.4 (*)    Abs Immature Granulocytes 0.20 (*)    All other components within normal limits  CBC - Abnormal; Notable for the following components:   WBC 14.8 (*)    RBC 2.07 (*)    Hemoglobin 7.2 (*)    HCT 21.4 (*)    MCV 103.4 (*)    MCH 34.8 (*)    RDW 19.2 (*)    nRBC 12.9 (*)    All other components within normal limits  COMPREHENSIVE METABOLIC PANEL WITH GFR - Abnormal; Notable for the following components:   Creatinine, Ser 0.37 (*)    Calcium 8.5 (*)    Albumin 2.9 (*)    ALT 45 (*)    Total Bilirubin 2.8 (*)    All other components within normal  limits  CBC WITH DIFFERENTIAL/PLATELET - Abnormal; Notable for the following components:   WBC 11.5 (*)    RBC 1.90 (*)    Hemoglobin 6.3 (*)    HCT 18.6 (*)    RDW 18.2 (*)    nRBC 5.7 (*)    Neutro Abs 9.0 (*)    All other components within normal limits  HEMOGLOBIN AND HEMATOCRIT, BLOOD - Abnormal; Notable for the following components:   Hemoglobin 7.7 (*)    HCT 22.3 (*)    All other components within normal limits  CBC - Abnormal; Notable for the following components:   WBC 11.3 (*)    RBC 2.35 (*)    Hemoglobin 7.6 (*)    HCT 23.2 (*)    RDW 18.2 (*)    nRBC 2.6 (*)    All other components within normal limits  BASIC METABOLIC PANEL WITH GFR - Abnormal; Notable for the following components:   CO2 20 (*)    Calcium 8.7 (*)    All other components within normal limits  CULTURE, BLOOD (ROUTINE X 2)  RESP PANEL BY RT-PCR (RSV, FLU A&B, COVID)  [id number]  CREATININE, SERUM  CBC WITH DIFFERENTIAL/PLATELET  TYPE AND SCREEN  PREPARE RBC (CROSSMATCH)  TYPE AND SCREEN  PREPARE RBC (CROSSMATCH)     Hospital Course:  Patient was admitted for sickle cell pain crisis and managed appropriately with IVF, IV Dilaudid via PCA and IV Toradol, as well as other adjunct therapies per sickle cell pain management protocols.Patient during this admission developed fever, cough, increased heart rate,productive cough and malaise. Patient was empirically treated with Vancomycin and cefepime. Today patient is reporting improved pain of 5/10, she is afebrile. She is tolerating PO and able to ambulate without assistance.  Patient was therefore discharged home today in a hemodynamically stable condition.   Bailey Reese will follow-up with PCP within 1 week of this discharge. Bailey Reese was counseled extensively about nonpharmacologic means of pain management, patient verbalized understanding and was appreciative of  the care received during this admission.   We discussed the need for good hydration, monitoring  of hydration status, avoidance of heat, cold, stress, and infection triggers. We discussed the need to be adherent with taking Hydrea and other home medications. Patient was reminded of the need to seek medical attention immediately if any symptom of bleeding, anemia, or infection occurs.  Discharge Exam: Vitals:   08/29/23  0741 08/29/23 0951  BP:  109/62  Pulse:  88  Resp: 17 18  Temp:  98.5 F (36.9 C)  SpO2:  94%   Vitals:   08/29/23 0312 08/29/23 0427 08/29/23 0741 08/29/23 0951  BP:  (!) 104/58  109/62  Pulse:  97  88  Resp: (!) 25 16 17 18   Temp:  99.4 F (37.4 C)  98.5 F (36.9 C)  TempSrc:  Oral  Oral  SpO2:  94%  94%  Weight:      Height:        General appearance : Awake, alert, not in any distress. Speech Clear. Not toxic looking HEENT: Atraumatic and Normocephalic, pupils equally reactive to light and accomodation Neck: Supple, no JVD. No cervical lymphadenopathy.  Chest: Good air entry bilaterally, no added sounds  CVS: S1 S2 regular, no murmurs.  Abdomen: Bowel sounds present, Non tender and not distended with no gaurding, rigidity or rebound. Extremities: B/L Lower Ext shows no edema, both legs are warm to touch Neurology: Awake alert, and oriented X 3, CN II-XII intact, Non focal Skin: No Rash  Discharge Instructions  Discharge Instructions     Call MD for:  severe uncontrolled pain   Complete by: As directed    Call MD for:  temperature >100.4   Complete by: As directed    Diet - low sodium heart healthy   Complete by: As directed    Increase activity slowly   Complete by: As directed       Allergies as of 08/29/2023       Reactions   Cherry Anaphylaxis, Itching, Swelling   Olanzapine Other (See Comments)   "Drug-induced liver injury"   Other Swelling, Other (See Comments)   "Pitted fruits"   Peanut-containing Drug Products Anaphylaxis, Itching, Swelling   Plum Pulp Anaphylaxis, Hives   Ibuprofen Nausea And Vomiting   Morphine And Codeine  Hives, Itching   Peach [prunus Persica] Itching, Swelling, Other (See Comments)   Tylenol [acetaminophen] Nausea And Vomiting        Medication List     TAKE these medications    budesonide-formoterol 80-4.5 MCG/ACT inhaler Commonly known as: SYMBICORT Inhale 2 puffs into the lungs 2 (two) times daily as needed (wheezing, shortness of breath).   Butrans 10 MCG/HR Ptwk Generic drug: buprenorphine Place 1 patch onto the skin once a week.   CALCIUM 600+D3 PO Take 1 tablet by mouth daily.   Deferasirox 360 MG Tabs Take 360 mg by mouth 2 (two) times daily.   ergocalciferol 1.25 MG (50000 UT) capsule Commonly known as: VITAMIN D2 Take 50,000 Units by mouth every Saturday.   esomeprazole 40 MG capsule Commonly known as: NEXIUM Take 1 capsule (40 mg total) by mouth daily as needed (reflux).   fexofenadine 180 MG tablet Commonly known as: ALLEGRA Take 180 mg by mouth daily as needed for allergies.   fluticasone 50 MCG/ACT nasal spray Commonly known as: FLONASE Place 1 spray into both nostrils daily as needed for allergies or rhinitis.   hydroxyurea 500 MG capsule Commonly known as: HYDREA Take 500 mg by mouth in the morning, at noon, and at bedtime.   hydrOXYzine 25 MG tablet Commonly known as: ATARAX Take 25 mg by mouth 2 (two) times daily.   lubiprostone 8 MCG capsule Commonly known as: AMITIZA Take 8 mcg by mouth 2 (two) times daily with a meal.   ondansetron 4 MG disintegrating tablet Commonly known as: ZOFRAN-ODT Take 4 mg by mouth every 8 (  eight) hours as needed for nausea or vomiting (dissolve orally).   Oxycodone HCl 10 MG Tabs Take 1 tablet (10 mg total) by mouth every 6 (six) hours as needed (pain). What changed: reasons to take this   pantoprazole 40 MG tablet Commonly known as: Protonix Take 1 tablet (40 mg total) by mouth 2 (two) times daily.   prazosin 1 MG capsule Commonly known as: MINIPRESS Take 1 mg by mouth at bedtime.   pregabalin 150  MG capsule Commonly known as: LYRICA Take 150 mg by mouth 3 (three) times daily.   prochlorperazine 5 MG tablet Commonly known as: COMPAZINE Take 5 mg by mouth every 6 (six) hours as needed for nausea or vomiting.   promethazine 12.5 MG tablet Commonly known as: PHENERGAN Take 12.5 mg by mouth every 6 (six) hours as needed for nausea or vomiting.   sucralfate 1 g tablet Commonly known as: CARAFATE Take 1 tablet (1 g total) by mouth 2 (two) times daily.        The results of significant diagnostics from this hospitalization (including imaging, microbiology, ancillary and laboratory) are listed below for reference.    Significant Diagnostic Studies: DG Chest 2 View Result Date: 08/21/2023 CLINICAL DATA:  chest pain, sickle cell pain crisis, eval for unlikely acute chest EXAM: CHEST - 2 VIEW COMPARISON:  05/31/2023. FINDINGS: Cardiac silhouette is unremarkable. No pneumothorax or pleural effusion. The lungs are clear. Mild thoracic sickle cell related endplate changes. No acute osseous abnormalities. T12 mild endplate changes may be sickle cell-related. IMPRESSION: No acute cardiopulmonary process. Electronically Signed   By: Sydell Eva M.D.   On: 08/21/2023 13:21    Microbiology: Recent Results (from the past 240 hours)  Culture, blood (Routine X 2) w Reflex to ID Panel     Status: None   Collection Time: 08/22/23  5:31 PM   Specimen: BLOOD  Result Value Ref Range Status   Specimen Description   Final    BLOOD BLOOD RIGHT HAND Performed at [facility], [address], [place], [place] [address]    Special Requests   Final    BOTTLES DRAWN AEROBIC AND ANAEROBIC Blood Culture adequate volume Performed at [facility], [address] [place], [place] [address]    Culture   Final    NO GROWTH 5 DAYS Performed at [facility], [address], [place], [place] [address]    Report Status 08/27/2023 FINAL  Final  Resp panel by  RT-PCR (RSV, Flu A&B, Covid) Anterior Nasal Swab     Status: None   Collection Time: 08/24/23  2:18 PM   Specimen: Anterior Nasal Swab  Result Value Ref Range Status   SARS Coronavirus 2 by RT PCR NEGATIVE NEGATIVE Final    Comment: (NOTE) SARS-CoV-2 target nucleic acids are NOT DETECTED.  The SARS-CoV-2 RNA is generally detectable in upper respiratory specimens during the acute phase of infection. The lowest concentration of SARS-CoV-2 viral copies this assay can detect is 138 copies/mL. A negative result does not preclude SARS-Cov-2 infection and should not be used as the sole basis for treatment or other patient management decisions. A negative result may occur with  improper specimen collection/handling, submission of specimen other than nasopharyngeal swab, presence of viral mutation(s) within the areas targeted by this assay, and inadequate number of viral copies(<138 copies/mL). A negative result must be combined with clinical observations, patient history, and epidemiological information. The expected result is Negative.  Fact Sheet for Patients:  BloggerCourse.com  Fact Sheet for Healthcare Providers:  SeriousBroker.it  This test is no t yet approved or cleared by the [place]  FDA and  has been authorized for detection and/or diagnosis of SARS-CoV-2 by FDA under an Emergency Use Authorization (EUA). This EUA will remain  in effect (meaning this test can be used) for the duration of the COVID-19 declaration under Section 564(b)(1) of the Act, 21 U.S.C.section 360bbb-3(b)(1), unless the authorization is terminated  or revoked sooner.       Influenza A by PCR NEGATIVE NEGATIVE Final   Influenza B by PCR NEGATIVE NEGATIVE Final    Comment: (NOTE) The Xpert Xpress SARS-CoV-2/FLU/RSV plus assay is intended as an aid in the diagnosis of influenza from Nasopharyngeal swab specimens and should not be used as a sole basis  for treatment. Nasal washings and aspirates are unacceptable for Xpert Xpress SARS-CoV-2/FLU/RSV testing.  Fact Sheet for Patients: BloggerCourse.com  Fact Sheet for Healthcare Providers: SeriousBroker.it  This test is not yet approved or cleared by the [place]  FDA and has been authorized for detection and/or diagnosis of SARS-CoV-2 by FDA under an Emergency Use Authorization (EUA). This EUA will remain in effect (meaning this test can be used) for the duration of the COVID-19 declaration under Section 564(b)(1) of the Act, 21 U.S.C. section 360bbb-3(b)(1), unless the authorization is terminated or revoked.     Resp Syncytial Virus by PCR NEGATIVE NEGATIVE Final    Comment: (NOTE) Fact Sheet for Patients: BloggerCourse.com  Fact Sheet for Healthcare Providers: SeriousBroker.it  This test is not yet approved or cleared by the [place]  FDA and has been authorized for detection and/or diagnosis of SARS-CoV-2 by FDA under an Emergency Use Authorization (EUA). This EUA will remain in effect (meaning this test can be used) for the duration of the COVID-19 declaration under Section 564(b)(1) of the Act, 21 U.S.C. section 360bbb-3(b)(1), unless the authorization is terminated or revoked.  Performed at [facility], [address], [place], [place] [address]      Labs: Basic Metabolic Panel: Recent Labs  Lab 08/24/23 0537 08/26/23 1400 08/28/23 0530  NA  --  135 137  K  --  3.5 3.6  CL  --  104 107  CO2  --  23 20*  GLUCOSE  --  86 95  BUN  --  6 7  CREATININE 0.48 0.37* 0.50  CALCIUM  --  8.5* 8.7*   Liver Function Tests: Recent Labs  Lab 08/26/23 1400  AST 29  ALT 45*  ALKPHOS 110  BILITOT 2.8*  PROT 6.9  ALBUMIN 2.9*   No results for input(s): "LIPASE", "AMYLASE" in the last 168 hours. No results for input(s): "AMMONIA" in the  last 168 hours. CBC: Recent Labs  Lab 08/22/23 1718 08/22/23 2102 08/23/23 0539 08/24/23 0537 08/26/23 1400 08/27/23 0517 08/28/23 0530  WBC 17.1* 18.9* 16.3* 14.8* 11.5*  --  11.3*  NEUTROABS 10.7*  --   --   --  9.0*  --   --   HGB 6.7* 7.4* 8.1* 7.2* 6.3* 7.7* 7.6*  HCT 19.0* 21.3* 23.7* 21.4* 18.6* 22.3* 23.2*  MCV 106.1* 108.1* 103.0* 103.4* 97.9  --  98.7  PLT 228 224 211 199 233  --  341   Cardiac Enzymes: No results for input(s): "CKTOTAL", "CKMB", "CKMBINDEX", "TROPONINI" in the last 168 hours. BNP: Invalid input(s): "POCBNP" CBG: No results for input(s): "GLUCAP" in the last 168 hours.  Time coordinating discharge: 50 minutes  Signed:  Darvin England  Ginette Lah NP  [facility] 08/29/2023, 1:44 PM

## 2023-10-16 ENCOUNTER — Other Ambulatory Visit: Payer: Self-pay

## 2023-10-16 ENCOUNTER — Emergency Department
Admission: EM | Admit: 2023-10-16 | Discharge: 2023-10-16 | Disposition: A | Discharge status: Other Acute Inpatient Hospital | Attending: Emergency Medicine | Admitting: Emergency Medicine

## 2023-10-16 ENCOUNTER — Other Ambulatory Visit: Payer: Self-pay | Admitting: Nurse Practitioner

## 2023-10-16 ENCOUNTER — Emergency Department

## 2023-10-16 ENCOUNTER — Encounter (HOSPITAL_COMMUNITY): Payer: Self-pay

## 2023-10-16 ENCOUNTER — Encounter (HOSPITAL_COMMUNITY): Payer: Self-pay | Admitting: Internal Medicine

## 2023-10-16 ENCOUNTER — Inpatient Hospital Stay (HOSPITAL_COMMUNITY)
Admit: 2023-10-16 | Discharge: 2023-10-23 | DRG: 812 | Disposition: A | Discharge status: Home / Self Care | Attending: Internal Medicine | Admitting: Internal Medicine

## 2023-10-16 DIAGNOSIS — Z8673 Personal history of transient ischemic attack (TIA), and cerebral infarction without residual deficits: Secondary | ICD-10-CM | POA: Diagnosis not present

## 2023-10-16 DIAGNOSIS — F4310 Post-traumatic stress disorder, unspecified: Secondary | ICD-10-CM | POA: Diagnosis present

## 2023-10-16 DIAGNOSIS — Z91010 Allergy to peanuts: Secondary | ICD-10-CM | POA: Diagnosis not present

## 2023-10-16 DIAGNOSIS — F411 Generalized anxiety disorder: Secondary | ICD-10-CM | POA: Diagnosis present

## 2023-10-16 DIAGNOSIS — D72828 Other elevated white blood cell count: Secondary | ICD-10-CM | POA: Diagnosis present

## 2023-10-16 DIAGNOSIS — G4733 Obstructive sleep apnea (adult) (pediatric): Secondary | ICD-10-CM | POA: Diagnosis present

## 2023-10-16 DIAGNOSIS — J4520 Mild intermittent asthma, uncomplicated: Secondary | ICD-10-CM | POA: Diagnosis present

## 2023-10-16 DIAGNOSIS — D638 Anemia in other chronic diseases classified elsewhere: Secondary | ICD-10-CM | POA: Diagnosis present

## 2023-10-16 DIAGNOSIS — Z888 Allergy status to other drugs, medicaments and biological substances status: Secondary | ICD-10-CM

## 2023-10-16 DIAGNOSIS — F419 Anxiety disorder, unspecified: Secondary | ICD-10-CM | POA: Insufficient documentation

## 2023-10-16 DIAGNOSIS — G894 Chronic pain syndrome: Secondary | ICD-10-CM | POA: Diagnosis present

## 2023-10-16 DIAGNOSIS — Z1152 Encounter for screening for COVID-19: Secondary | ICD-10-CM

## 2023-10-16 DIAGNOSIS — R000 Tachycardia, unspecified: Secondary | ICD-10-CM | POA: Insufficient documentation

## 2023-10-16 DIAGNOSIS — D72829 Elevated white blood cell count, unspecified: Secondary | ICD-10-CM | POA: Insufficient documentation

## 2023-10-16 DIAGNOSIS — Z91018 Allergy to other foods: Secondary | ICD-10-CM | POA: Diagnosis not present

## 2023-10-16 DIAGNOSIS — D5700 Hb-SS disease with crisis, unspecified: Principal | ICD-10-CM | POA: Diagnosis present

## 2023-10-16 DIAGNOSIS — Z885 Allergy status to narcotic agent status: Secondary | ICD-10-CM | POA: Diagnosis not present

## 2023-10-16 DIAGNOSIS — Z886 Allergy status to analgesic agent status: Secondary | ICD-10-CM

## 2023-10-16 DIAGNOSIS — Z833 Family history of diabetes mellitus: Secondary | ICD-10-CM | POA: Diagnosis not present

## 2023-10-16 DIAGNOSIS — Z743 Need for continuous supervision: Secondary | ICD-10-CM | POA: Diagnosis not present

## 2023-10-16 DIAGNOSIS — Z7951 Long term (current) use of inhaled steroids: Secondary | ICD-10-CM | POA: Diagnosis not present

## 2023-10-16 LAB — CBC
HCT: 27.5 % — ABNORMAL LOW (ref 36.0–46.0)
Hemoglobin: 9.6 g/dL — ABNORMAL LOW (ref 12.0–15.0)
MCH: 32.7 pg (ref 26.0–34.0)
MCHC: 34.9 g/dL (ref 30.0–36.0)
MCV: 93.5 fL (ref 80.0–100.0)
Platelets: 526 10*3/uL — ABNORMAL HIGH (ref 150–400)
RBC: 2.94 MIL/uL — ABNORMAL LOW (ref 3.87–5.11)
RDW: 16.3 % — ABNORMAL HIGH (ref 11.5–15.5)
WBC: 17.3 10*3/uL — ABNORMAL HIGH (ref 4.0–10.5)
nRBC: 0.9 % — ABNORMAL HIGH (ref 0.0–0.2)

## 2023-10-16 LAB — URINALYSIS, ROUTINE W REFLEX MICROSCOPIC
Bilirubin Urine: NEGATIVE
Glucose, UA: NEGATIVE mg/dL
Hgb urine dipstick: NEGATIVE
Ketones, ur: NEGATIVE mg/dL
Leukocytes,Ua: NEGATIVE
Nitrite: NEGATIVE
Protein, ur: NEGATIVE mg/dL
Specific Gravity, Urine: 1.015 (ref 1.005–1.030)
pH: 6 (ref 5.0–8.0)

## 2023-10-16 LAB — RESP PANEL BY RT-PCR (RSV, FLU A&B, COVID)  RVPGX2
Influenza A by PCR: NEGATIVE
Influenza B by PCR: NEGATIVE
Resp Syncytial Virus by PCR: NEGATIVE
SARS Coronavirus 2 by RT PCR: NEGATIVE

## 2023-10-16 LAB — COMPREHENSIVE METABOLIC PANEL WITH GFR
ALT: 27 U/L (ref 0–44)
AST: 30 U/L (ref 15–41)
Albumin: 4.8 g/dL (ref 3.5–5.0)
Alkaline Phosphatase: 112 U/L (ref 38–126)
Anion gap: 10 (ref 5–15)
BUN: 10 mg/dL (ref 6–20)
CO2: 21 mmol/L — ABNORMAL LOW (ref 22–32)
Calcium: 9.6 mg/dL (ref 8.9–10.3)
Chloride: 106 mmol/L (ref 98–111)
Creatinine, Ser: 0.55 mg/dL (ref 0.44–1.00)
GFR, Estimated: >60 mL/min (ref >60)
Glucose, Bld: 100 mg/dL — ABNORMAL HIGH (ref 70–99)
Potassium: 3.5 mmol/L (ref 3.5–5.1)
Sodium: 137 mmol/L (ref 135–145)
Total Bilirubin: 4.6 mg/dL — ABNORMAL HIGH (ref 0.0–1.2)
Total Protein: 8.9 g/dL — ABNORMAL HIGH (ref 6.5–8.1)

## 2023-10-16 LAB — RETICULOCYTES
Immature Retic Fract: 31.8 % — ABNORMAL HIGH (ref 2.3–15.9)
RBC.: 2.95 MIL/uL — ABNORMAL LOW (ref 3.87–5.11)
Retic Count, Absolute: 442.5 10*3/uL — ABNORMAL HIGH (ref 19.0–186.0)
Retic Ct Pct: 15 % — ABNORMAL HIGH (ref 0.4–3.1)

## 2023-10-16 LAB — HCG, QUANTITATIVE, PREGNANCY: hCG, Beta Chain, Quant, S: <1 m[IU]/mL (ref <5)

## 2023-10-16 MED ORDER — PANTOPRAZOLE SODIUM 40 MG PO TBEC
40.0000 mg | DELAYED_RELEASE_TABLET | Freq: Two times a day (BID) | ORAL | Status: DC
  Administered 2023-10-16 – 2023-10-23 (×14): 40 mg via ORAL
  Filled 2023-10-16 (×14): qty 1

## 2023-10-16 MED ORDER — LUBIPROSTONE 8 MCG PO CAPS
8.0000 ug | ORAL_CAPSULE | Freq: Two times a day (BID) | ORAL | Status: DC
  Administered 2023-10-17 – 2023-10-23 (×13): 8 ug via ORAL
  Filled 2023-10-16 (×14): qty 1

## 2023-10-16 MED ORDER — NALOXONE HCL 0.4 MG/ML IJ SOLN: 0.4000 mg | INTRAMUSCULAR | Status: DC | PRN

## 2023-10-16 MED ORDER — OXYCODONE HCL 5 MG PO TABS
10.0000 mg | ORAL_TABLET | Freq: Four times a day (QID) | ORAL | Status: DC | PRN
  Administered 2023-10-17 – 2023-10-23 (×13): 10 mg via ORAL
  Filled 2023-10-16 (×15): qty 2

## 2023-10-16 MED ORDER — HYDROMORPHONE 1 MG/ML IV SOLN
INTRAVENOUS | Status: DC
  Administered 2023-10-16: 7.5 mg via INTRAVENOUS
  Administered 2023-10-16: 30 mg via INTRAVENOUS
  Administered 2023-10-17: 9.5 mg via INTRAVENOUS
  Administered 2023-10-17: 6.5 mg via INTRAVENOUS
  Administered 2023-10-17: 7.5 mg via INTRAVENOUS
  Administered 2023-10-17: 30 mg via INTRAVENOUS
  Administered 2023-10-17: 6.5 mg via INTRAVENOUS
  Administered 2023-10-18: 3.5 mg via INTRAVENOUS
  Administered 2023-10-18: 20.5 mg via INTRAVENOUS
  Administered 2023-10-18: 30 mg via INTRAVENOUS
  Administered 2023-10-18: 0.001 mg via INTRAVENOUS
  Administered 2023-10-19: 8.5 mg via INTRAVENOUS
  Administered 2023-10-19: 30 mg via INTRAVENOUS
  Administered 2023-10-19: 7.5 mg via INTRAVENOUS
  Administered 2023-10-19: 0.01 mg via INTRAVENOUS
  Administered 2023-10-19: 9 mg via INTRAVENOUS
  Administered 2023-10-19: 12 mg via INTRAVENOUS
  Administered 2023-10-19: 13.5 mg via INTRAVENOUS
  Administered 2023-10-20: 10 mg via INTRAVENOUS
  Administered 2023-10-20: 7 mg via INTRAVENOUS
  Administered 2023-10-20: 6 mg via INTRAVENOUS
  Administered 2023-10-20: 7.5 mg via INTRAVENOUS
  Administered 2023-10-20 (×2): 30 mg via INTRAVENOUS
  Administered 2023-10-20: 14 mg via INTRAVENOUS
  Administered 2023-10-20: 11 mg via INTRAVENOUS
  Administered 2023-10-21: 4 mg via INTRAVENOUS
  Administered 2023-10-21: 30 mg via INTRAVENOUS
  Administered 2023-10-21: 11 mg via INTRAVENOUS
  Administered 2023-10-21: 1.5 mg via INTRAVENOUS
  Administered 2023-10-21: 11 mg via INTRAVENOUS
  Administered 2023-10-21: 0.5 mg via INTRAVENOUS
  Administered 2023-10-22: 10.5 mg via INTRAVENOUS
  Administered 2023-10-22: 30 mg via INTRAVENOUS
  Administered 2023-10-22: 0.5 mg via INTRAVENOUS
  Administered 2023-10-22: 3 mg via INTRAVENOUS
  Administered 2023-10-22 (×2): 8.5 mg via INTRAVENOUS
  Administered 2023-10-22: 10 mg via INTRAVENOUS
  Administered 2023-10-23: 13.5 mg via INTRAVENOUS
  Administered 2023-10-23: 12.5 mg via INTRAVENOUS
  Administered 2023-10-23: 5 mg via INTRAVENOUS
  Administered 2023-10-23: 9 mg via INTRAVENOUS
  Administered 2023-10-23: 30 mg via INTRAVENOUS
  Filled 2023-10-16 (×10): qty 30

## 2023-10-16 MED ORDER — HYDROMORPHONE HCL 1 MG/ML IJ SOLN
0.5000 mg | INTRAMUSCULAR | Status: AC
  Administered 2023-10-16: 0.5 mg via INTRAVENOUS
  Filled 2023-10-16: qty 0.5

## 2023-10-16 MED ORDER — SUCRALFATE 1 G PO TABS
1.0000 g | ORAL_TABLET | Freq: Two times a day (BID) | ORAL | Status: DC
  Administered 2023-10-17 – 2023-10-23 (×13): 1 g via ORAL
  Filled 2023-10-16 (×13): qty 1

## 2023-10-16 MED ORDER — FLUTICASONE PROPIONATE 50 MCG/ACT NA SUSP: 1.0000 | Freq: Every day | NASAL | Status: DC | PRN

## 2023-10-16 MED ORDER — SODIUM CHLORIDE 0.9 % IV BOLUS
500.0000 mL | Freq: Once | INTRAVENOUS | Status: AC
  Administered 2023-10-16: 500 mL via INTRAVENOUS

## 2023-10-16 MED ORDER — ENOXAPARIN SODIUM 40 MG/0.4ML IJ SOSY
40.0000 mg | PREFILLED_SYRINGE | INTRAMUSCULAR | Status: DC
  Filled 2023-10-16 (×3): qty 0.4

## 2023-10-16 MED ORDER — SENNOSIDES-DOCUSATE SODIUM 8.6-50 MG PO TABS
1.0000 | ORAL_TABLET | Freq: Two times a day (BID) | ORAL | Status: DC
  Administered 2023-10-16 – 2023-10-22 (×13): 1 via ORAL
  Filled 2023-10-16 (×14): qty 1

## 2023-10-16 MED ORDER — PREGABALIN 75 MG PO CAPS
150.0000 mg | ORAL_CAPSULE | Freq: Three times a day (TID) | ORAL | Status: DC
  Administered 2023-10-16 – 2023-10-23 (×20): 150 mg via ORAL
  Filled 2023-10-16 (×20): qty 2

## 2023-10-16 MED ORDER — SODIUM CHLORIDE 0.9% FLUSH: 9.0000 mL | INTRAVENOUS | Status: DC | PRN

## 2023-10-16 MED ORDER — ONDANSETRON HCL 4 MG/2ML IJ SOLN
4.0000 mg | Freq: Four times a day (QID) | INTRAMUSCULAR | Status: DC | PRN
  Administered 2023-10-18 – 2023-10-23 (×12): 4 mg via INTRAVENOUS
  Filled 2023-10-16 (×12): qty 2

## 2023-10-16 MED ORDER — POLYETHYLENE GLYCOL 3350 17 G PO PACK: 17.0000 g | PACK | Freq: Every day | ORAL | Status: DC | PRN

## 2023-10-16 MED ORDER — DEFERASIROX 360 MG PO TABS: 360.0000 mg | ORAL_TABLET | Freq: Two times a day (BID) | ORAL | Status: DC

## 2023-10-16 MED ORDER — FLUTICASONE FUROATE-VILANTEROL 100-25 MCG/ACT IN AEPB
1.0000 | INHALATION_SPRAY | Freq: Every day | RESPIRATORY_TRACT | Status: DC
  Administered 2023-10-21 – 2023-10-23 (×2): 1 via RESPIRATORY_TRACT
  Filled 2023-10-16: qty 28

## 2023-10-16 MED ORDER — HYDROMORPHONE HCL 1 MG/ML IJ SOLN: 1.0000 mg | INTRAMUSCULAR | Status: DC | PRN

## 2023-10-16 MED ORDER — HYDROMORPHONE HCL 1 MG/ML IJ SOLN
1.0000 mg | INTRAMUSCULAR | Status: AC
  Administered 2023-10-16: 1 mg via SUBCUTANEOUS
  Filled 2023-10-16: qty 1

## 2023-10-16 MED ORDER — DIPHENHYDRAMINE HCL 25 MG PO CAPS
25.0000 mg | ORAL_CAPSULE | ORAL | Status: DC | PRN
  Administered 2023-10-16 – 2023-10-22 (×8): 25 mg via ORAL
  Filled 2023-10-16 (×8): qty 1

## 2023-10-16 MED ORDER — HYDROMORPHONE HCL 1 MG/ML IJ SOLN
1.0000 mg | INTRAMUSCULAR | Status: AC
  Administered 2023-10-16: 1 mg via INTRAVENOUS
  Filled 2023-10-16: qty 1

## 2023-10-16 MED ORDER — PRAZOSIN HCL 1 MG PO CAPS
1.0000 mg | ORAL_CAPSULE | Freq: Every day | ORAL | Status: DC
  Administered 2023-10-17 – 2023-10-22 (×6): 1 mg via ORAL
  Filled 2023-10-16 (×6): qty 1

## 2023-10-16 MED ORDER — DIPHENHYDRAMINE HCL 25 MG PO CAPS
25.0000 mg | ORAL_CAPSULE | ORAL | Status: AC
  Administered 2023-10-16: 25 mg via ORAL
  Filled 2023-10-16: qty 1

## 2023-10-16 MED ORDER — SODIUM CHLORIDE 0.45 % IV SOLN: INTRAVENOUS | Status: AC

## 2023-10-16 NOTE — ED Notes (Signed)
 [facility] called for Transfer per DR. Quale Md. , spoke with Andy Bannister

## 2023-10-16 NOTE — ED Notes (Signed)
 EMTALA reviewed by this RN, pt ready for transport. Transfer consent obtained.

## 2023-10-16 NOTE — H&P (Cosign Needed)
 H&P  Patient Demographics:  Bailey Reese, is a 18 y.o. female  MRN: [id number]   DOB - [birth year]  Admit Date - 10/16/2023  Outpatient Primary MD for the patient is Little, Ozell Blunt, MD  No chief complaint on file.     HPI:   Bailey Reese  is a 18 y.o. female with medical history significant of sickle cell disease, PTSD, depression with anxiety who usually gets her care at [facility] but was seen at [facility] for the last 24 hours where she stayed in the ER with sickle cell pain crisis.  Patient was managed in the ER for crisis however her pain was not controlled. Patient was transferred to our care for continued sickle cell crisis pain control. Patient reported to have pain similar to her typical sickle cell crisis. Is mainly in her legs and back.  She has vitals that are stable.  Hemoglobin is around 9.6 g/dl.  She was getting IV Dilaudid  pushes in the ER.  Patient denied any nausea vomiting or diarrhea.  Denied any viral illness.  She has had previous osteomyelitis and multiple episodes of acute chest syndromes.  Also hemorrhagic stroke in 2017.  Patient has chronic pain from her sickle cell disease.  She has been admitted for sickle cell pain management.   ED course: Patient was treated with IVF, IV pain medication with no resolution to symptoms. She is admitted in patient for ongoing sickle cell pain management crisis WBC: 17.3, Pulse Rate:119, Temperature 99.5  WBC 17.3 (*)       RBC 2.94 (*)      Hemoglobin 9.6 (*)      HCT 27.5 (*)      RDW 16.3 (*)      Platelets 526 (*)      nRBC 0.9 (*)      All other components within normal limits  COMPREHENSIVE METABOLIC PANEL WITH GFR - Abnormal; Notable for the following components:    CO2 21 (*)      Glucose, Bld 100 (*)      Total Protein 8.9 (*)      Total Bilirubin 4.6 (*)      All other components within normal limits  RETICULOCYTES - Abnormal; Notable for the following components:    Retic Ct Pct 15.0 (*)       RBC. 2.95 (*)      Retic Count, Absolute 442.5 (*)      Immature Retic Fract 31.8 (*)      All other components within normal limits  RESP PANEL BY RT-PCR (RSV, FLU A&B, COVID)  RVPGX2  HCG, QUANTITATIVE, PREGNANCY  URINALYSIS, ROUTINE W REFLEX MICROSCOPIC          Review of systems:  In addition to the HPI above, patient reports No fever or chills No Headache, No changes with vision or hearing No problems swallowing food or liquids No chest pain, cough or shortness of breath No abdominal pain, No nausea or vomiting, Bowel movements are regular No blood in stool or urine No dysuria No new skin rashes or bruises No new joints pains-aches No new weakness, tingling, numbness in any extremity No recent weight gain or loss No polyuria, polydypsia or polyphagia No significant Mental Stressors  A full 10 point Review of Systems was done, except as stated above, all other Review of Systems were negative.  With Past History of the following :   Past Medical History:  Diagnosis Date   Anxiety    Depression  PTSD (post-traumatic stress disorder)    Sickle cell anemia (HCC)       Past Surgical History:  Procedure Laterality Date   BIOPSY  07/04/2023   Procedure: BIOPSY;  Surgeon: Genell Ken, MD;  Location: [facility];  Service: Gastroenterology;;   CHOLECYSTECTOMY     ESOPHAGOGASTRODUODENOSCOPY (EGD) WITH PROPOFOL  N/A 07/04/2023   Procedure: ESOPHAGOGASTRODUODENOSCOPY (EGD) WITH PROPOFOL ;  Surgeon: Genell Ken, MD;  Location: [facility];  Service: Gastroenterology;  Laterality: N/A;   WRIST SURGERY       Social History:   Social History   Tobacco Use   Smoking status: Never   Smokeless tobacco: Never  Substance Use Topics   Alcohol use: No     Lives - At home   Family History :   Family History  Problem Relation Age of Onset   Diabetes Paternal Aunt      Home Medications:   Prior to Admission medications   Medication Sig Start Date End Date Taking?  Authorizing Provider  Deferasirox 360 MG TABS Take 360 mg by mouth 2 (two) times daily.   Yes [provider]  ergocalciferol (VITAMIN D2) 1.25 MG (50000 UT) capsule Take 50,000 Units by mouth once a week.   Yes [provider]  esomeprazole  (NEXIUM ) 40 MG capsule Take 1 capsule (40 mg total) by mouth daily as needed (reflux). 08/29/23  Yes Lorel Roes, NP  Oxycodone  HCl 10 MG TABS Take 1 tablet (10 mg total) by mouth every 6 (six) hours as needed (pain). Patient taking differently: Take 10 mg by mouth every 6 (six) hours as needed (FOR PAIN). 07/05/23  Yes Davida Espy, MD  budesonide-formoterol  (SYMBICORT) 80-4.5 MCG/ACT inhaler Inhale 2 puffs into the lungs 2 (two) times daily as needed (wheezing, shortness of breath). 12/29/22 12/29/23  [provider]  buprenorphine  (BUTRANS ) 10 MCG/HR PTWK Place 1 patch onto the skin once a week.    [provider]  Calcium Carb-Cholecalciferol (CALCIUM 600+D3 PO) Take 1 tablet by mouth daily.    [provider]  fexofenadine (ALLEGRA) 180 MG tablet Take 180 mg by mouth daily as needed for allergies. 12/29/22   [provider]  fluticasone  (FLONASE ) 50 MCG/ACT nasal spray Place 1 spray into both nostrils daily as needed for allergies or rhinitis. 12/29/22   [provider]  hydrOXYzine  (ATARAX ) 25 MG tablet Take 25 mg by mouth 2 (two) times daily.    [provider]  lubiprostone  (AMITIZA ) 8 MCG capsule Take 8 mcg by mouth 2 (two) times daily with a meal. 06/01/23 05/31/24  [provider]  ondansetron  (ZOFRAN -ODT) 4 MG disintegrating tablet Take 4 mg by mouth every 8 (eight) hours as needed for nausea or vomiting (dissolve orally).    [provider]  pantoprazole  (PROTONIX ) 40 MG tablet Take 1 tablet (40 mg total) by mouth 2 (two) times daily. 07/05/23 07/04/24  Davida Espy, MD  prazosin  (MINIPRESS ) 1 MG capsule Take 1 mg by mouth at bedtime.    [provider]  pregabalin  (LYRICA ) 150 MG capsule Take 150 mg by mouth 3 (three) times daily.    [provider]  prochlorperazine (COMPAZINE) 5 MG tablet Take 5 mg by mouth every 6 (six) hours as needed for nausea or vomiting.    [provider]  promethazine (PHENERGAN) 12.5 MG tablet Take 12.5 mg by mouth every 6 (six) hours as needed for nausea or vomiting. 05/10/23   [provider]  sucralfate  (CARAFATE ) 1 g tablet  Take 1 tablet (1 g total) by mouth 2 (two) times daily. 07/05/23   Davida Espy, MD     Allergies:   Allergies  Allergen Reactions   Cherry Anaphylaxis, Itching and Swelling   Olanzapine  Other (See Comments)    "Drug-induced liver injury"   Other Swelling and Other (See Comments)    "Pitted fruits"   Peanut-Containing Drug Products Anaphylaxis, Itching and Swelling   Plum Pulp Anaphylaxis and Hives   Morphine  And Codeine Hives and Itching   Peach [Prunus Persica] Itching, Swelling and Other (See Comments)   Acetaminophen  Nausea And Vomiting   Ibuprofen  Nausea And Vomiting     Physical Exam:   Vitals:   Vitals:   10/16/23 1955  Resp: 20  SpO2: 100%    Physical Exam: Constitutional: Patient appears well-developed and well-nourished. Not in obvious distress. HENT: Normocephalic, atraumatic, External right and left ear normal. Oropharynx is clear and moist.  Eyes: Conjunctivae and EOM are normal. PERRLA, no scleral icterus. Neck: Normal ROM. Neck supple. No JVD. No tracheal deviation. No thyromegaly. CVS: RRR, S1/S2 +, no murmurs, no gallops, no carotid bruit.  Pulmonary: Effort and breath sounds normal, no stridor, rhonchi, wheezes, rales.  Abdominal: Soft. BS +, no distension, tenderness, rebound or guarding.  Musculoskeletal: Generalize body pain, lower extremity and back pain . Lymphadenopathy: No lymphadenopathy noted, cervical, inguinal or axillary Neuro: Alert. Normal reflexes, muscle tone coordination. No cranial nerve  deficit. Skin: Skin is warm and dry. No rash noted. Not diaphoretic. No erythema. No pallor. Psychiatric: Normal mood and affect. Behavior, judgment, thought content normal.   Data Review:   CBC Recent Labs  Lab 10/16/23 0730  WBC 17.3*  HGB 9.6*  HCT 27.5*  PLT 526*  MCV 93.5  MCH 32.7  MCHC 34.9  RDW 16.3*   ------------------------------------------------------------------------------------------------------------------  Chemistries  Recent Labs  Lab 10/16/23 0730  NA 137  K 3.5  CL 106  CO2 21*  GLUCOSE 100*  BUN 10  CREATININE 0.55  CALCIUM 9.6  AST 30  ALT 27  ALKPHOS 112  BILITOT 4.6*   ------------------------------------------------------------------------------------------------------------------ estimated creatinine clearance is 107.1 mL/min (by C-G formula based on SCr of 0.55 mg/dL). ------------------------------------------------------------------------------------------------------------------ No results for input(s): "TSH", "T4TOTAL", "T3FREE", "THYROIDAB" in the last 72 hours.  Invalid input(s): "FREET3"  Coagulation profile No results for input(s): "INR", "PROTIME" in the last 168 hours. ------------------------------------------------------------------------------------------------------------------- No results for input(s): "DDIMER" in the last 72 hours. -------------------------------------------------------------------------------------------------------------------  Cardiac Enzymes No results for input(s): "CKMB", "TROPONINI", "MYOGLOBIN" in the last 168 hours.  Invalid input(s): "CK" ------------------------------------------------------------------------------------------------------------------ No results found for: "BNP"  ---------------------------------------------------------------------------------------------------------------  Urinalysis    Component Value Date/Time   COLORURINE YELLOW 10/16/2023 0933   APPEARANCEUR  CLEAR 10/16/2023 0933   APPEARANCEUR Clear 05/01/2013 1440   LABSPEC 1.015 10/16/2023 0933   LABSPEC 1.016 05/01/2013 1440   PHURINE 6.0 10/16/2023 0933   GLUCOSEU NEGATIVE 10/16/2023 0933   GLUCOSEU Negative 05/01/2013 1440   HGBUR NEGATIVE 10/16/2023 0933   BILIRUBINUR NEGATIVE 10/16/2023 0933   BILIRUBINUR Negative 05/01/2013 1440   KETONESUR NEGATIVE 10/16/2023 0933   PROTEINUR NEGATIVE 10/16/2023 0933   NITRITE NEGATIVE 10/16/2023 0933   LEUKOCYTESUR NEGATIVE 10/16/2023 0933   LEUKOCYTESUR Negative 05/01/2013 1440    ----------------------------------------------------------------------------------------------------------------   Imaging Results:    DG Chest 2 View Result Date: 10/16/2023 CLINICAL DATA:  Chest pain for 3 days.  History of sickle cell. EXAM: CHEST - 2 VIEW COMPARISON:  Radiographs 08/21/2023 and 05/31/2023.  CT 05/27/2023. FINDINGS: Mild patient  rotation to the right. The heart size is stable at the upper limits of normal. There is mild atelectasis at both lung bases. No edema, confluent airspace disease, pleural effusion or pneumothorax. Mild thoracic scoliosis without acute osseous abnormality. IMPRESSION: Mild bibasilar atelectasis. No evidence of acute chest process. Electronically Signed   By: Elmon Hagedorn M.D.   On: 10/16/2023 08:36     Assessment & Plan:  Principal Problem:   Sickle cell anemia with crisis (HCC)   Hb Sickle Cell Disease with crisis: Admit patient, start IVF 0.45% Saline @ 125 mls/hour, start weight based Dilaudid  PCA, start IV Toradol  15 mg Q 6 H, Restart oral home pain medications, Monitor vitals very closely, Re-evaluate pain scale regularly, 2 L of Oxygen by [place], Patient will be re-evaluated for pain in the context of function and relationship to baseline as care progresses. Leukocytosis:  Anemia of Chronic Disease:  Chronic pain Syndrome:    DVT Prophylaxis: Subcut Lovenox    AM Labs Ordered, also please review Full  Orders  Family Communication: Admission, patient's condition and plan of care including tests being ordered have been discussed with the patient who indicate understanding and agree with the plan and Code Status.  Code Status: Full Code  Consults called: None    Admission status: Inpatient    Time spent in minutes : 50 minutes  Lorel Roes  10/16/2023 at 9:45 PMH&P  Patient Demographics:  Bailey Reese, is a 18 y.o. female  MRN: [id number]   DOB - June 27, 2005  Admit Date - 10/16/2023  Outpatient Primary MD for the patient is Little, Ozell Blunt, MD  No chief complaint on file.     HPI:   Bailey Reese  is a 18 y.o. female    Review of systems:  In addition to the HPI above, patient reports No fever or chills No Headache, No changes with vision or hearing No problems swallowing food or liquids No chest pain, cough or shortness of breath No abdominal pain, No nausea or vomiting, Bowel movements are regular No blood in stool or urine No dysuria No new skin rashes or bruises No new joints pains-aches No new weakness, tingling, numbness in any extremity No recent weight gain or loss No polyuria, polydypsia or polyphagia No significant Mental Stressors  A full 10 point Review of Systems was done, except as stated above, all other Review of Systems were negative.  With Past History of the following :   Past Medical History:  Diagnosis Date   Anxiety    Depression    PTSD (post-traumatic stress disorder)    Sickle cell anemia (HCC)       Past Surgical History:  Procedure Laterality Date   BIOPSY  07/04/2023   Procedure: BIOPSY;  Surgeon: Genell Ken, MD;  Location: [facility];  Service: Gastroenterology;;   CHOLECYSTECTOMY     ESOPHAGOGASTRODUODENOSCOPY (EGD) WITH PROPOFOL  N/A 07/04/2023   Procedure: ESOPHAGOGASTRODUODENOSCOPY (EGD) WITH PROPOFOL ;  Surgeon: Genell Ken, MD;  Location: [facility];  Service: Gastroenterology;  Laterality: N/A;   WRIST SURGERY        Social History:   Social History   Tobacco Use   Smoking status: Never   Smokeless tobacco: Never  Substance Use Topics   Alcohol use: No     Lives - At home   Family History :   Family History  Problem Relation Age of Onset   Diabetes Paternal Aunt      Home Medications:   Prior to Admission medications  Medication Sig Start Date End Date Taking? Authorizing Provider  Deferasirox 360 MG TABS Take 360 mg by mouth 2 (two) times daily.   Yes [provider]  ergocalciferol (VITAMIN D2) 1.25 MG (50000 UT) capsule Take 50,000 Units by mouth once a week.   Yes [provider]  esomeprazole  (NEXIUM ) 40 MG capsule Take 1 capsule (40 mg total) by mouth daily as needed (reflux). 08/29/23  Yes Lorel Roes, NP  Oxycodone  HCl 10 MG TABS Take 1 tablet (10 mg total) by mouth every 6 (six) hours as needed (pain). Patient taking differently: Take 10 mg by mouth every 6 (six) hours as needed (FOR PAIN). 07/05/23  Yes Davida Espy, MD  budesonide-formoterol  (SYMBICORT) 80-4.5 MCG/ACT inhaler Inhale 2 puffs into the lungs 2 (two) times daily as needed (wheezing, shortness of breath). 12/29/22 12/29/23  [provider]  buprenorphine  (BUTRANS ) 10 MCG/HR PTWK Place 1 patch onto the skin once a week.    [provider]  Calcium Carb-Cholecalciferol (CALCIUM 600+D3 PO) Take 1 tablet by mouth daily.    [provider]  fexofenadine (ALLEGRA) 180 MG tablet Take 180 mg by mouth daily as needed for allergies. 12/29/22   [provider]  fluticasone  (FLONASE ) 50 MCG/ACT nasal spray Place 1 spray into both nostrils daily as needed for allergies or rhinitis. 12/29/22   [provider]  hydrOXYzine  (ATARAX ) 25 MG tablet Take 25 mg by mouth 2 (two) times daily.    [provider]  lubiprostone  (AMITIZA ) 8 MCG capsule Take 8 mcg by mouth 2 (two) times daily with a meal. 06/01/23 05/31/24  [provider]  ondansetron   (ZOFRAN -ODT) 4 MG disintegrating tablet Take 4 mg by mouth every 8 (eight) hours as needed for nausea or vomiting (dissolve orally).    [provider]  pantoprazole  (PROTONIX ) 40 MG tablet Take 1 tablet (40 mg total) by mouth 2 (two) times daily. 07/05/23 07/04/24  Davida Espy, MD  prazosin  (MINIPRESS ) 1 MG capsule Take 1 mg by mouth at bedtime.    [provider]  pregabalin  (LYRICA ) 150 MG capsule Take 150 mg by mouth 3 (three) times daily.    [provider]  prochlorperazine (COMPAZINE) 5 MG tablet Take 5 mg by mouth every 6 (six) hours as needed for nausea or vomiting.    [provider]  promethazine (PHENERGAN) 12.5 MG tablet Take 12.5 mg by mouth every 6 (six) hours as needed for nausea or vomiting. 05/10/23   [provider]  sucralfate  (CARAFATE ) 1 g tablet Take 1 tablet (1 g total) by mouth 2 (two) times daily. 07/05/23   Davida Espy, MD     Allergies:   Allergies  Allergen Reactions   Cherry Anaphylaxis, Itching and Swelling   Olanzapine  Other (See Comments)    "Drug-induced liver injury"   Other Swelling and Other (See Comments)    "Pitted fruits"   Peanut-Containing Drug Products Anaphylaxis, Itching and Swelling   Plum Pulp Anaphylaxis and Hives   Morphine  And Codeine Hives and Itching   Peach [Prunus Persica] Itching, Swelling and Other (See Comments)   Acetaminophen  Nausea And Vomiting   Ibuprofen  Nausea And Vomiting     Physical Exam:   Vitals:   Vitals:   10/16/23 1955  Resp: 20  SpO2: 100%    Physical Exam: Constitutional: Patient appears well-developed and well-nourished. Not in obvious distress. HENT: Normocephalic, atraumatic, External right and left ear normal. Oropharynx is clear and moist.  Eyes: Conjunctivae  and EOM are normal. PERRLA, no scleral icterus. Neck: Normal ROM. Neck supple. No JVD. No tracheal deviation. No thyromegaly. CVS: RRR, S1/S2 +, no murmurs, no gallops, no carotid bruit.   Pulmonary: Effort and breath sounds normal, no stridor, rhonchi, wheezes, rales.  Abdominal: Soft. BS +, no distension, tenderness, rebound or guarding.  Musculoskeletal: Generalize body pain, lower extremity and back pain  Lymphadenopathy: No lymphadenopathy noted, cervical, inguinal or axillary Neuro: Alert. Normal reflexes, muscle tone coordination. No cranial nerve deficit. Skin: Skin is warm and dry. No rash noted. Not diaphoretic. No erythema. No pallor. Psychiatric: Normal mood and affect. Behavior, judgment, thought content normal.   Data Review:   CBC Recent Labs  Lab 10/16/23 0730  WBC 17.3*  HGB 9.6*  HCT 27.5*  PLT 526*  MCV 93.5  MCH 32.7  MCHC 34.9  RDW 16.3*   ------------------------------------------------------------------------------------------------------------------  Chemistries  Recent Labs  Lab 10/16/23 0730  NA 137  K 3.5  CL 106  CO2 21*  GLUCOSE 100*  BUN 10  CREATININE 0.55  CALCIUM 9.6  AST 30  ALT 27  ALKPHOS 112  BILITOT 4.6*   ------------------------------------------------------------------------------------------------------------------ estimated creatinine clearance is 107.1 mL/min (by C-G formula based on SCr of 0.55 mg/dL). ------------------------------------------------------------------------------------------------------------------ No results for input(s): "TSH", "T4TOTAL", "T3FREE", "THYROIDAB" in the last 72 hours.  Invalid input(s): "FREET3"  Coagulation profile No results for input(s): "INR", "PROTIME" in the last 168 hours. ------------------------------------------------------------------------------------------------------------------- No results for input(s): "DDIMER" in the last 72 hours. -------------------------------------------------------------------------------------------------------------------  Cardiac Enzymes No results for input(s): "CKMB", "TROPONINI", "MYOGLOBIN" in the last 168  hours.  Invalid input(s): "CK" ------------------------------------------------------------------------------------------------------------------ No results found for: "BNP"  ---------------------------------------------------------------------------------------------------------------  Urinalysis    Component Value Date/Time   COLORURINE YELLOW 10/16/2023 0933   APPEARANCEUR CLEAR 10/16/2023 0933   APPEARANCEUR Clear 05/01/2013 1440   LABSPEC 1.015 10/16/2023 0933   LABSPEC 1.016 05/01/2013 1440   PHURINE 6.0 10/16/2023 0933   GLUCOSEU NEGATIVE 10/16/2023 0933   GLUCOSEU Negative 05/01/2013 1440   HGBUR NEGATIVE 10/16/2023 0933   BILIRUBINUR NEGATIVE 10/16/2023 0933   BILIRUBINUR Negative 05/01/2013 1440   KETONESUR NEGATIVE 10/16/2023 0933   PROTEINUR NEGATIVE 10/16/2023 0933   NITRITE NEGATIVE 10/16/2023 0933   LEUKOCYTESUR NEGATIVE 10/16/2023 0933   LEUKOCYTESUR Negative 05/01/2013 1440    ----------------------------------------------------------------------------------------------------------------   Imaging Results:    DG Chest 2 View Result Date: 10/16/2023 CLINICAL DATA:  Chest pain for 3 days.  History of sickle cell. EXAM: CHEST - 2 VIEW COMPARISON:  Radiographs 08/21/2023 and 05/31/2023.  CT 05/27/2023. FINDINGS: Mild patient rotation to the right. The heart size is stable at the upper limits of normal. There is mild atelectasis at both lung bases. No edema, confluent airspace disease, pleural effusion or pneumothorax. Mild thoracic scoliosis without acute osseous abnormality. IMPRESSION: Mild bibasilar atelectasis. No evidence of acute chest process. Electronically Signed   By: Elmon Hagedorn M.D.   On: 10/16/2023 08:36     Assessment & Plan:  Principal Problem:   Sickle cell anemia with crisis (HCC)   Hb Sickle Cell Disease with crisis: Admit patient, start IVF 0.45% Saline @ 125 mls/hour, start weight based Dilaudid  PCA, start IV Toradol  15 mg Q 6 H, Restart  oral home pain medications, Monitor vitals very closely, Re-evaluate pain scale regularly, 2 L of Oxygen by [place], Patient will be re-evaluated for pain in the context of function and relationship to baseline as care progresses. Leukocytosis: Elevated possibly due to vaso-occlusive crisis. No acute S/S of infection, will continue  to monitor, daily labs in place.  Anemia of Chronic Disease: decreased below patients base line, will continue to monitor and transfuse as appropriate.  Chronic pain Syndrome: Continue oral home pain medication.    DVT Prophylaxis: Subcut Lovenox    AM Labs Ordered, also please review Full Orders  Family Communication: Admission, patient's condition and plan of care including tests being ordered have been discussed with the patient who indicate understanding and agree with the plan and Code Status.  Code Status: Full Code  Consults called: None    Admission status: Inpatient    Time spent in minutes : 50 minutes  Lorel Roes  10/16/2023 at 9:45 PM

## 2023-10-16 NOTE — ED Triage Notes (Signed)
 Arrives via EMS from home Hx of Sickle cell dz States she has chronic pain since Friday Took her oxycodone  this AM w/no relief, states this is ongoing and has to come to the ER for pain relief  BLS on EMS, no other interventions Vitals: 130/80, HR 115, 100% RA, no CBG  No abnormal EMS assessments

## 2023-10-16 NOTE — ED Provider Notes (Signed)
 [facility] Provider Note    Event Date/Time   First MD Initiated Contact with Patient 10/16/23 0720     (approximate)   History   Sickle Cell Pain Crisis   HPI  Bailey Reese is a 18 y.o. female with a history of sickle cell disease, PTSD depression and anxiety.  On prior admission pain mainly in the legs and back.  Prior hemoglobin approximately 7.4 at previous hospital stay.  Additionally has a history of hemorrhagic stroke, prior acute chest syndromes and history of osteomyelitis.  Patient was also treated with vancomycin  and cefepime  after developing a productive cough and malaise on prior hospitalization    Patient relates that she has been experiencing pain from sickle cell with bodyaches over her whole body since Friday.  She has been taking oxycodone  as prescribed but is not helping.  She reports the pain is typical of her crises.  She will get pain all over her body especially in her back arms and legs.  She has a very slight amount of pain in her chest, but no cough or fever.  She does not feel short of breath.  She also reports that the majority of her pain is located in her muscles and all over.  She does not have a headache.  She has had no fever or chills that she is aware of.  She was feeling well until started having pain that she identifies as her crisis.  She was last hospitalized at [facility] about a month ago  Physical Exam   Triage Vital Signs: ED Triage Vitals  Encounter Vitals Group     BP 10/16/23 0714 129/86     Systolic BP Percentile --      Diastolic BP Percentile --      Pulse Rate 10/16/23 0714 (!) 119     Resp 10/16/23 0714 14     Temp 10/16/23 0714 99.5 F (37.5 C)     Temp Source 10/16/23 0714 Oral     SpO2 10/16/23 0714 98 %     Weight 10/16/23 0715 145 lb (65.8 kg)     Height 10/16/23 0715 5\' 4"  (1.626 m)     Head Circumference --      Peak Flow --      Pain Score 10/16/23 0715 9     Pain Loc --       Pain Education --      Exclude from Growth Chart --     Most recent vital signs: Vitals:   10/16/23 1123 10/16/23 1525  BP: 108/64 123/68  Pulse: 99 86  Resp: 16 17  Temp: 98.4 F (36.9 C) 98.5 F (36.9 C)  SpO2: 100% 100%     General: Awake, no distress.  She is resting without obvious distress but laying on her side in a right decubitus type position.  She does appear uncomfortable however. CV:  Good peripheral perfusion.  Normal tones Resp:  Normal effort.  Clear bilateral with normal work of breathing Abd:  No distention.  Soft nontender nondistended Other:  No noted edema in any of the extremities, warm well-perfused all extremities.   ED Results / Procedures / Treatments   Labs (all labs ordered are listed, but only abnormal results are displayed) Labs Reviewed  CBC - Abnormal; Notable for the following components:      Result Value   WBC 17.3 (*)    RBC 2.94 (*)    Hemoglobin 9.6 (*)  HCT 27.5 (*)    RDW 16.3 (*)    Platelets 526 (*)    nRBC 0.9 (*)    All other components within normal limits  COMPREHENSIVE METABOLIC PANEL WITH GFR - Abnormal; Notable for the following components:   CO2 21 (*)    Glucose, Bld 100 (*)    Total Protein 8.9 (*)    Total Bilirubin 4.6 (*)    All other components within normal limits  RETICULOCYTES - Abnormal; Notable for the following components:   Retic Ct Pct 15.0 (*)    RBC. 2.95 (*)    Retic Count, Absolute 442.5 (*)    Immature Retic Fract 31.8 (*)    All other components within normal limits  RESP PANEL BY RT-PCR (RSV, FLU A&B, COVID)  RVPGX2  HCG, QUANTITATIVE, PREGNANCY  URINALYSIS, ROUTINE W REFLEX MICROSCOPIC       RADIOLOGY  DG Chest 2 View Result Date: 10/16/2023 CLINICAL DATA:  Chest pain for 3 days.  History of sickle cell. EXAM: CHEST - 2 VIEW COMPARISON:  Radiographs 08/21/2023 and 05/31/2023.  CT 05/27/2023. FINDINGS: Mild patient rotation to the right. The heart size is stable at the upper limits of  normal. There is mild atelectasis at both lung bases. No edema, confluent airspace disease, pleural effusion or pneumothorax. Mild thoracic scoliosis without acute osseous abnormality. IMPRESSION: Mild bibasilar atelectasis. No evidence of acute chest process. Electronically Signed   By: Elmon Hagedorn M.D.   On: 10/16/2023 08:36   EKG interpreted by me at 3:52 PM heart rate 75 cures 80 QTc 440 Normal sinus rhythm, nonspecific T wave abnormality noted V1 and V2 3.  No evidence of acute ischemia.  Compared with previous ECGs morphology appears similar   PROCEDURES:  Critical Care performed: No  Procedures   MEDICATIONS ORDERED IN ED: Medications  diphenhydrAMINE  (BENADRYL ) capsule 25 mg (25 mg Oral Given 10/16/23 0742)  sodium chloride  0.9 % bolus 500 mL (0 mLs Intravenous Stopped 10/16/23 0914)  HYDROmorphone  (DILAUDID ) injection 1 mg (1 mg Subcutaneous Given 10/16/23 0810)  HYDROmorphone  (DILAUDID ) injection 0.5 mg (0.5 mg Intravenous Given 10/16/23 0913)  HYDROmorphone  (DILAUDID ) injection 1 mg (1 mg Intravenous Given 10/16/23 1018)  HYDROmorphone  (DILAUDID ) injection 0.5 mg (0.5 mg Intravenous Given 10/16/23 1331)  HYDROmorphone  (DILAUDID ) injection 0.5 mg (0.5 mg Intravenous Given 10/16/23 1534)     IMPRESSION / MDM / ASSESSMENT AND PLAN / ED COURSE  I reviewed the triage vital signs and the nursing notes.                              Differential diagnosis includes, but is not limited to, possible sickle cell crisis vaso-occlusive crisis, feel less likely this represents an acute chest type syndrome as she reports that her pain and the events of this pain are typical for her.  She is not currently febrile though does have a low-grade 99.5 reading.  She denies any acute pulmonary symptoms shortness of breath or persistent concerning type chest pain.    Patient's presentation is most consistent with acute complicated illness / injury requiring diagnostic workup.   Clinical Course as of  10/16/23 1538  Mon Oct 16, 2023  0912 WBC(!): 17.3 [MQ]  0913 Pulse Rate(!): 119 [MQ]  0913 Temp: 99.5 F (37.5 C) Leukocytosis noted.  Patient does also have associated mild tachycardia and questionably a low-grade fever.  She does not however endorse any obvious infectious symptomatology.  Awaiting urinalysis  and viral studies [MQ]  1004 Patient reports moderate improvement in pain briefly after last dose of hydromorphone , but pain is once again increasing.  She appears uncomfortable.  Reports pain throughout arms legs and lower back.  At this juncture, I have initiated transfer request to [facility] for sickle cell specific care and treatment.  Patient is very agreeable with this.  She has followed with [facility] in the past but was last hospitalized at [facility] long and had a positive experience there and agreeable for transfer to [facility].  [MQ]    Clinical Course User Index [MQ] Iver Marker, MD   ----------------------------------------- 10:32 AM on 10/16/2023 ----------------------------------------- Discussed case and patient accepted in transfer to [facility] sickle cell team by Marylu Soda, NP  ----------------------------------------- 3:38 PM on 10/16/2023 ----------------------------------------- Patient remains oriented, occasionally requiring hydromorphone  for body aches and sickle pain throughout shift.  Ongoing care assigned to Dr. Karlynn Oyster with understanding of plan the patient is currently pending transfer once bed available to [facility]    FINAL CLINICAL IMPRESSION(S) / ED DIAGNOSES   Final diagnoses:  Sickle cell anemia with crisis [facility])     Rx / DC Orders   ED Discharge Orders     None        Note:  This document was prepared using Dragon voice recognition software and may include unintentional dictation errors.   Iver Marker, MD 10/16/23 [phone]

## 2023-10-17 LAB — CBC
HCT: 22.9 % — ABNORMAL LOW (ref 36.0–46.0)
Hemoglobin: 7.9 g/dL — ABNORMAL LOW (ref 12.0–15.0)
MCH: 32.5 pg (ref 26.0–34.0)
MCHC: 34.5 g/dL (ref 30.0–36.0)
MCV: 94.2 fL (ref 80.0–100.0)
Platelets: 362 10*3/uL (ref 150–400)
RBC: 2.43 MIL/uL — ABNORMAL LOW (ref 3.87–5.11)
RDW: 15.8 % — ABNORMAL HIGH (ref 11.5–15.5)
WBC: 14.5 10*3/uL — ABNORMAL HIGH (ref 4.0–10.5)
nRBC: 0.6 % — ABNORMAL HIGH (ref 0.0–0.2)

## 2023-10-17 LAB — LACTIC ACID, PLASMA: Lactic Acid, Venous: 1 mmol/L (ref 0.5–1.9)

## 2023-10-17 MED ORDER — SODIUM CHLORIDE 0.9% FLUSH
10.0000 mL | Freq: Two times a day (BID) | INTRAVENOUS | Status: DC
  Administered 2023-10-17 – 2023-10-23 (×9): 10 mL

## 2023-10-17 MED ORDER — SODIUM CHLORIDE 0.9% FLUSH: 10.0000 mL | INTRAVENOUS | Status: DC | PRN

## 2023-10-17 NOTE — Plan of Care (Signed)

## 2023-10-17 NOTE — Progress Notes (Signed)

## 2023-10-17 NOTE — Progress Notes (Cosign Needed)
 Patient ID: Bailey Reese, female   DOB: May 23, 2005, 18 y.o.   MRN: [id number] Subjective: Bailey Reese is a 18 year old female with a medical history significant for sickle cell disease, chronic pain syndrome, and anemia of chronic disease was admitted for sickle cell pain crisis.   Objective:  Vital signs in last 24 hours:  Vitals:   10/17/23 1018 10/17/23 1215 10/17/23 1307 10/17/23 1435  BP: 115/65  132/73   Pulse: 88  96   Resp: 16 16 16 16   Temp: 98.4 F (36.9 C)  98 F (36.7 C)   TempSrc: Oral  Oral   SpO2: 99%  97%   Weight:      Height:        Intake/Output from previous day:   Intake/Output Summary (Last 24 hours) at 10/17/2023 1645 Last data filed at 10/17/2023 0250 Gross per 24 hour  Intake 1203.33 ml  Output --  Net 1203.33 ml    Physical Exam: General: Alert, awake, oriented x3, in no acute distress.  HEENT: [place]/AT PEERL, EOMI Neck: Trachea midline,  no masses, no thyromegal,y no JVD, no carotid bruit OROPHARYNX:  Moist, No exudate/ erythema/lesions.  Heart: Regular rate and rhythm, without murmurs, rubs, gallops, PMI non-displaced, no heaves or thrills on palpation.  Lungs: Clear to auscultation, no wheezing or rhonchi noted. No increased vocal fremitus resonant to percussion  Abdomen: Soft, nontender, nondistended, positive bowel sounds, no masses no hepatosplenomegaly noted..  Neuro: No focal neurological deficits noted cranial nerves II through XII grossly intact. DTRs 2+ bilaterally upper and lower extremities. Strength 5 out of 5 in bilateral upper and lower extremities. Musculoskeletal: Bilateral upper and lower extremity tenderness Psychiatric: Patient alert and oriented x3, good insight and cognition, good recent to remote recall. Lymph node survey: No cervical axillary or inguinal lymphadenopathy noted.  Lab Results:  Basic Metabolic Panel:    Component Value Date/Time   NA 137 10/16/2023 0730   NA 135 05/01/2013 1440   K 3.5 10/16/2023  0730   K 4.3 05/01/2013 1440   CL 106 10/16/2023 0730   CL 102 05/01/2013 1440   CO2 21 (L) 10/16/2023 0730   CO2 27 (H) 05/01/2013 1440   BUN 10 10/16/2023 0730   BUN 13 05/01/2013 1440   CREATININE 0.55 10/16/2023 0730   CREATININE 0.40 (L) 05/01/2013 1440   GLUCOSE 100 (H) 10/16/2023 0730   GLUCOSE 108 (H) 05/01/2013 1440   CALCIUM 9.6 10/16/2023 0730   CALCIUM 9.8 05/01/2013 1440   CBC:    Component Value Date/Time   WBC 14.5 (H) 10/17/2023 1229   HGB 7.9 (L) 10/17/2023 1229   HGB 6.6 (L) 05/01/2013 1440   HCT 22.9 (L) 10/17/2023 1229   HCT 20.1 (L) 05/01/2013 1440   PLT 362 10/17/2023 1229   PLT 468 (H) 05/01/2013 1440   MCV 94.2 10/17/2023 1229   MCV 89 05/01/2013 1440   NEUTROABS 9.0 (H) 08/26/2023 1400   NEUTROABS 7.9 05/30/2012 1958   LYMPHSABS 1.1 08/26/2023 1400   LYMPHSABS 5.0 05/30/2012 1958   MONOABS 1.0 08/26/2023 1400   MONOABS 1.6 (H) 05/30/2012 1958   EOSABS 0.4 08/26/2023 1400   EOSABS 0.6 05/30/2012 1958   BASOSABS 0.1 08/26/2023 1400   BASOSABS 3 04/28/2013 1736   BASOSABS 0.2 (H) 05/30/2012 1958    Recent Results (from the past 240 hours)  Resp panel by RT-PCR (RSV, Flu A&B, Covid) Anterior Nasal Swab     Status: None   Collection Time: 10/16/23  9:33 AM  Specimen: Anterior Nasal Swab  Result Value Ref Range Status   SARS Coronavirus 2 by RT PCR NEGATIVE NEGATIVE Final    Comment: (NOTE) SARS-CoV-2 target nucleic acids are NOT DETECTED.  The SARS-CoV-2 RNA is generally detectable in upper respiratory specimens during the acute phase of infection. The lowest concentration of SARS-CoV-2 viral copies this assay can detect is 138 copies/mL. A negative result does not preclude SARS-Cov-2 infection and should not be used as the sole basis for treatment or other patient management decisions. A negative result may occur with  improper specimen collection/handling, submission of specimen other than nasopharyngeal swab, presence of viral  mutation(s) within the areas targeted by this assay, and inadequate number of viral copies(<138 copies/mL). A negative result must be combined with clinical observations, patient history, and epidemiological information. The expected result is Negative.  Fact Sheet for Patients:  BloggerCourse.com  Fact Sheet for Healthcare Providers:  SeriousBroker.it  This test is no t yet approved or cleared by the [place]  FDA and  has been authorized for detection and/or diagnosis of SARS-CoV-2 by FDA under an Emergency Use Authorization (EUA). This EUA will remain  in effect (meaning this test can be used) for the duration of the COVID-19 declaration under Section 564(b)(1) of the Act, 21 U.S.C.section 360bbb-3(b)(1), unless the authorization is terminated  or revoked sooner.       Influenza A by PCR NEGATIVE NEGATIVE Final   Influenza B by PCR NEGATIVE NEGATIVE Final    Comment: (NOTE) The Xpert Xpress SARS-CoV-2/FLU/RSV plus assay is intended as an aid in the diagnosis of influenza from Nasopharyngeal swab specimens and should not be used as a sole basis for treatment. Nasal washings and aspirates are unacceptable for Xpert Xpress SARS-CoV-2/FLU/RSV testing.  Fact Sheet for Patients: BloggerCourse.com  Fact Sheet for Healthcare Providers: SeriousBroker.it  This test is not yet approved or cleared by the [place]  FDA and has been authorized for detection and/or diagnosis of SARS-CoV-2 by FDA under an Emergency Use Authorization (EUA). This EUA will remain in effect (meaning this test can be used) for the duration of the COVID-19 declaration under Section 564(b)(1) of the Act, 21 U.S.C. section 360bbb-3(b)(1), unless the authorization is terminated or revoked.     Resp Syncytial Virus by PCR NEGATIVE NEGATIVE Final    Comment: (NOTE) Fact Sheet for  Patients: BloggerCourse.com  Fact Sheet for Healthcare Providers: SeriousBroker.it  This test is not yet approved or cleared by the [place]  FDA and has been authorized for detection and/or diagnosis of SARS-CoV-2 by FDA under an Emergency Use Authorization (EUA). This EUA will remain in effect (meaning this test can be used) for the duration of the COVID-19 declaration under Section 564(b)(1) of the Act, 21 U.S.C. section 360bbb-3(b)(1), unless the authorization is terminated or revoked.  Performed at [facility], [address] [place], [place] [address]     Studies/Results: DG Chest 2 View Result Date: 10/16/2023 CLINICAL DATA:  Chest pain for 3 days.  History of sickle cell. EXAM: CHEST - 2 VIEW COMPARISON:  Radiographs 08/21/2023 and 05/31/2023.  CT 05/27/2023. FINDINGS: Mild patient rotation to the right. The heart size is stable at the upper limits of normal. There is mild atelectasis at both lung bases. No edema, confluent airspace disease, pleural effusion or pneumothorax. Mild thoracic scoliosis without acute osseous abnormality. IMPRESSION: Mild bibasilar atelectasis. No evidence of acute chest process. Electronically Signed   By: Elmon Hagedorn M.D.   On: 10/16/2023 08:36    Medications:  Scheduled Meds:  Deferasirox  360 mg Oral BID   enoxaparin  (LOVENOX ) injection  40 mg Subcutaneous Q24H   fluticasone  furoate-vilanterol  1 puff Inhalation Daily   HYDROmorphone    Intravenous Q4H   lubiprostone   8 mcg Oral BID WC   pantoprazole   40 mg Oral BID   prazosin   1 mg Oral QHS   pregabalin   150 mg Oral TID   senna-docusate  1 tablet Oral BID   sodium chloride  flush  10-40 mL Intracatheter Q12H   sucralfate   1 g Oral BID AC   Continuous Infusions:  sodium chloride  125 mL/hr at 10/17/23 0516   PRN Meds:.diphenhydrAMINE , fluticasone , naloxone  **AND** sodium chloride  flush, ondansetron  (ZOFRAN ) IV, oxyCODONE ,  polyethylene glycol, sodium chloride  flush  Consultants: None  Procedures: None  Antibiotics: None  Assessment/Plan: Principal Problem:   Sickle cell anemia with crisis (HCC) Active Problems:   Chronic pain syndrome   Leucocytosis   Hb Sickle Cell Disease with Pain crisis: Continue IVF 0.45% Saline @ KVO mls/hour, continue weight based Dilaudid  PCA, continue oral home pain medications as ordered. Monitor vitals very closely, Re-evaluate pain scale regularly, 2 L of Oxygen by [place]. Patient encouraged to ambulate on the hallway today.  Leukocytosis: Elevated due to vaso-occlusive crisis. No S/S of acute infection .  Anemia of Chronic Disease: hgb at 7.9 below patients baseline. Will continue to monitor daily cbc and transfuse as appropriate. Chronic pain Syndrome: continue oral home pain medication   Code Status: Full Code Family Communication: N/A Disposition Plan: Not yet ready for discharge  Lorel Roes  If 7PM-7AM, please contact night-coverage.  10/17/2023, 4:45 PM  LOS: 1 day

## 2023-10-18 LAB — CBC
HCT: 21 % — ABNORMAL LOW (ref 36.0–46.0)
Hemoglobin: 7.3 g/dL — ABNORMAL LOW (ref 12.0–15.0)
MCH: 32.7 pg (ref 26.0–34.0)
MCHC: 34.8 g/dL (ref 30.0–36.0)
MCV: 94.2 fL (ref 80.0–100.0)
Platelets: 434 10*3/uL — ABNORMAL HIGH (ref 150–400)
RBC: 2.23 MIL/uL — ABNORMAL LOW (ref 3.87–5.11)
RDW: 15.9 % — ABNORMAL HIGH (ref 11.5–15.5)
WBC: 19 10*3/uL — ABNORMAL HIGH (ref 4.0–10.5)
nRBC: 0.4 % — ABNORMAL HIGH (ref 0.0–0.2)

## 2023-10-18 NOTE — TOC Initial Note (Signed)
 Transition of Care [facility]) - Initial/Assessment Note    Patient Details  Name: Bailey Reese MRN: [id number] Date of Birth: 2005/10/21  Transition of Care [facility]) CM/SW Contact:    Loreda Rodriguez, RN Phone Number:[phone]  10/18/2023, 3:20 PM  Clinical Narrative:                 TOC acknowledges patient with high risk for readmission. Patient states that she is from home where she normally functions independently. Patient states that she does have PCP and normally has treatment at [facility]. Patient confirms that she does have insurance and access affordable medications. Patient does have a walker at home that she uses as needed. There are currently no TOC needs.   Expected Discharge Plan: Home/Self Care Barriers to Discharge: Continued Medical Work up   Patient Goals and CMS Choice Patient states their goals for this hospitalization and ongoing recovery are:: Wants to get pain under control and be able to go home.   Choice offered to / list presented to : NA [facility] ownership interest in [facility].provided to::  (n/a)    Expected Discharge Plan and Services In-house Referral: NA Discharge Planning Services: CM Consult Post Acute Care Choice: NA Living arrangements for the past 2 months: Single Family Home                 DME Arranged: N/A DME Agency: NA       HH Arranged: NA HH Agency: NA        Prior Living Arrangements/Services Living arrangements for the past 2 months: Single Family Home Lives with:: Relatives (sisters brother and mother) Patient language and need for interpreter reviewed:: Yes Do you feel safe going back to the place where you live?: Yes      Need for Family Participation in Patient Care: Yes (Comment) Care giver support system in place?: Yes (comment) Current home services:  (n/a) Criminal Activity/Legal Involvement Pertinent to Current Situation/Hospitalization: No - Comment as needed  Activities of Daily Living   ADL Screening  (condition at time of admission) Independently performs ADLs?: Yes (appropriate for developmental age) Is the patient deaf or have difficulty hearing?: No Does the patient have difficulty seeing, even when wearing glasses/contacts?: No Does the patient have difficulty concentrating, remembering, or making decisions?: No  Permission Sought/Granted Permission sought to share information with : Family Supports Permission granted to share information with : No              Emotional Assessment Appearance:: Appears stated age Attitude/Demeanor/Rapport: Gracious Affect (typically observed): Pleasant Orientation: : Oriented to Self, Oriented to Place, Oriented to  Time, Oriented to Situation Alcohol / Substance Use: Not Applicable Psych Involvement: No (comment)  Admission diagnosis:  Sickle cell anemia with crisis [facility]) [D57.00] Patient Active Problem List   Diagnosis Date Noted   Sickle cell anemia with pain (HCC) 08/21/2023   GI bleed 07/03/2023   Vasoocclusive sickle cell crisis (HCC) 07/02/2023   Sickle cell anemia with crisis (HCC) 05/28/2023   Chronic pain syndrome 05/28/2023   Leucocytosis 05/28/2023   Thrombocytosis 05/28/2023   GAD (generalized anxiety disorder) 03/29/2023   Obstructive sleep apnea syndrome 02/04/2022   Mild intermittent asthma without complication 02/04/2022   Family dysfunction    Sickle cell crisis (HCC) 08/23/2017   Sickle cell pain crisis (HCC) 08/23/2017   PCP:  Gladys Lamp, MD Pharmacy:   [id number] - [place]), [place] - [address]  [place]) [place] [address] Phone: [phone] Fax: [phone]     Social Drivers of Health (SDOH) Social History: SDOH Screenings   Food Insecurity: No Food Insecurity (10/16/2023)  Housing: Low Risk  (10/16/2023)  Transportation Needs: No Transportation Needs (10/16/2023)  Utilities: Not At Risk (10/16/2023)  Financial Resource Strain: Low Risk   (07/25/2023)   [facility] from [facility]  Physical Activity: Insufficiently Active (12/10/2021)   Received from [facility]  Social Connections: Patient Declined (07/03/2023)  Stress: Stress Concern Present (12/10/2021)   Received from [facility]  Tobacco Use: Low Risk  (10/16/2023)   SDOH Interventions:     Readmission Risk Interventions    10/18/2023    2:34 PM  Readmission Risk Prevention Plan  Transportation Screening Complete  PCP or Specialist Appt within 3-5 Days Complete  HRI or Home Care Consult Complete  Social Work Consult for Recovery Care Planning/Counseling Complete  Palliative Care Screening Complete  Medication Review [facility]) Referral to Pharmacy

## 2023-10-18 NOTE — Plan of Care (Signed)
  Problem: Activity: Goal: Risk for activity intolerance will decrease Outcome: Progressing   Problem: Elimination: Goal: Will not experience complications related to bowel motility Outcome: Progressing Goal: Will not experience complications related to urinary retention Outcome: Progressing   Problem: Pain Managment: Goal: General experience of comfort will improve and/or be controlled Outcome: Progressing   Problem: Safety: Goal: Ability to remain free from injury will improve Outcome: Progressing   Problem: Skin Integrity: Goal: Risk for impaired skin integrity will decrease Outcome: Progressing

## 2023-10-18 NOTE — Progress Notes (Cosign Needed)
 Patient ID: Bailey Reese, female   DOB: 2005/10/21, 18 y.o.   MRN: [id number] Subjective: Bailey Reese is a 18 year old female with a medical history significant for sickle cell disease, chronic pain syndrome, and anemia of chronic disease was admitted for sickle cell pain crisis.   Objective:  Vital signs in last 24 hours:  Vitals:   10/18/23 0206 10/18/23 0500 10/18/23 0508 10/18/23 0844  BP: 133/69  (!) 140/82   Pulse: 96  92   Resp: 16 16 16 20   Temp:   98.5 F (36.9 C)   TempSrc:   Oral   SpO2: 98%  99%   Weight:      Height:        Intake/Output from previous day:  No intake or output data in the 24 hours ending 10/18/23 1154   Physical Exam: General: Alert, awake, oriented x3, in no acute distress.  HEENT: [place]/AT PEERL, EOMI Neck: Trachea midline,  no masses, no thyromegal,y no JVD, no carotid bruit OROPHARYNX:  Moist, No exudate/ erythema/lesions.  Heart: Regular rate and rhythm, without murmurs, rubs, gallops, PMI non-displaced, no heaves or thrills on palpation.  Lungs: Clear to auscultation, no wheezing or rhonchi noted. No increased vocal fremitus resonant to percussion  Abdomen: Soft, nontender, nondistended, positive bowel sounds, no masses no hepatosplenomegaly noted..  Neuro: No focal neurological deficits noted cranial nerves II through XII grossly intact. DTRs 2+ bilaterally upper and lower extremities. Strength 5 out of 5 in bilateral upper and lower extremities. Musculoskeletal: Bilateral upper and lower extremity tenderness Psychiatric: Patient alert and oriented x3, good insight and cognition, good recent to remote recall. Lymph node survey: No cervical axillary or inguinal lymphadenopathy noted.  Lab Results:  Basic Metabolic Panel:    Component Value Date/Time   NA 137 10/16/2023 0730   NA 135 05/01/2013 1440   K 3.5 10/16/2023 0730   K 4.3 05/01/2013 1440   CL 106 10/16/2023 0730   CL 102 05/01/2013 1440   CO2 21 (L) 10/16/2023 0730    CO2 27 (H) 05/01/2013 1440   BUN 10 10/16/2023 0730   BUN 13 05/01/2013 1440   CREATININE 0.55 10/16/2023 0730   CREATININE 0.40 (L) 05/01/2013 1440   GLUCOSE 100 (H) 10/16/2023 0730   GLUCOSE 108 (H) 05/01/2013 1440   CALCIUM 9.6 10/16/2023 0730   CALCIUM 9.8 05/01/2013 1440   CBC:    Component Value Date/Time   WBC 19.0 (H) 10/18/2023 0500   HGB 7.3 (L) 10/18/2023 0500   HGB 6.6 (L) 05/01/2013 1440   HCT 21.0 (L) 10/18/2023 0500   HCT 20.1 (L) 05/01/2013 1440   PLT 434 (H) 10/18/2023 0500   PLT 468 (H) 05/01/2013 1440   MCV 94.2 10/18/2023 0500   MCV 89 05/01/2013 1440   NEUTROABS 9.0 (H) 08/26/2023 1400   NEUTROABS 7.9 05/30/2012 1958   LYMPHSABS 1.1 08/26/2023 1400   LYMPHSABS 5.0 05/30/2012 1958   MONOABS 1.0 08/26/2023 1400   MONOABS 1.6 (H) 05/30/2012 1958   EOSABS 0.4 08/26/2023 1400   EOSABS 0.6 05/30/2012 1958   BASOSABS 0.1 08/26/2023 1400   BASOSABS 3 04/28/2013 1736   BASOSABS 0.2 (H) 05/30/2012 1958    Recent Results (from the past 240 hours)  Resp panel by RT-PCR (RSV, Flu A&B, Covid) Anterior Nasal Swab     Status: None   Collection Time: 10/16/23  9:33 AM   Specimen: Anterior Nasal Swab  Result Value Ref Range Status   SARS Coronavirus 2 by RT PCR  NEGATIVE NEGATIVE Final    Comment: (NOTE) SARS-CoV-2 target nucleic acids are NOT DETECTED.  The SARS-CoV-2 RNA is generally detectable in upper respiratory specimens during the acute phase of infection. The lowest concentration of SARS-CoV-2 viral copies this assay can detect is 138 copies/mL. A negative result does not preclude SARS-Cov-2 infection and should not be used as the sole basis for treatment or other patient management decisions. A negative result may occur with  improper specimen collection/handling, submission of specimen other than nasopharyngeal swab, presence of viral mutation(s) within the areas targeted by this assay, and inadequate number of viral copies(<138 copies/mL). A negative  result must be combined with clinical observations, patient history, and epidemiological information. The expected result is Negative.  Fact Sheet for Patients:  BloggerCourse.com  Fact Sheet for Healthcare Providers:  SeriousBroker.it  This test is no t yet approved or cleared by the [place]  FDA and  has been authorized for detection and/or diagnosis of SARS-CoV-2 by FDA under an Emergency Use Authorization (EUA). This EUA will remain  in effect (meaning this test can be used) for the duration of the COVID-19 declaration under Section 564(b)(1) of the Act, 21 U.S.C.section 360bbb-3(b)(1), unless the authorization is terminated  or revoked sooner.       Influenza A by PCR NEGATIVE NEGATIVE Final   Influenza B by PCR NEGATIVE NEGATIVE Final    Comment: (NOTE) The Xpert Xpress SARS-CoV-2/FLU/RSV plus assay is intended as an aid in the diagnosis of influenza from Nasopharyngeal swab specimens and should not be used as a sole basis for treatment. Nasal washings and aspirates are unacceptable for Xpert Xpress SARS-CoV-2/FLU/RSV testing.  Fact Sheet for Patients: BloggerCourse.com  Fact Sheet for Healthcare Providers: SeriousBroker.it  This test is not yet approved or cleared by the [place]  FDA and has been authorized for detection and/or diagnosis of SARS-CoV-2 by FDA under an Emergency Use Authorization (EUA). This EUA will remain in effect (meaning this test can be used) for the duration of the COVID-19 declaration under Section 564(b)(1) of the Act, 21 U.S.C. section 360bbb-3(b)(1), unless the authorization is terminated or revoked.     Resp Syncytial Virus by PCR NEGATIVE NEGATIVE Final    Comment: (NOTE) Fact Sheet for Patients: BloggerCourse.com  Fact Sheet for Healthcare Providers: SeriousBroker.it  This  test is not yet approved or cleared by the [place]  FDA and has been authorized for detection and/or diagnosis of SARS-CoV-2 by FDA under an Emergency Use Authorization (EUA). This EUA will remain in effect (meaning this test can be used) for the duration of the COVID-19 declaration under Section 564(b)(1) of the Act, 21 U.S.C. section 360bbb-3(b)(1), unless the authorization is terminated or revoked.  Performed at [facility], [address], [place], [place] [address]     Studies/Results: No results found.   Medications: Scheduled Meds:  Deferasirox  360 mg Oral BID   enoxaparin  (LOVENOX ) injection  40 mg Subcutaneous Q24H   fluticasone  furoate-vilanterol  1 puff Inhalation Daily   HYDROmorphone    Intravenous Q4H   lubiprostone   8 mcg Oral BID WC   pantoprazole   40 mg Oral BID   prazosin   1 mg Oral QHS   pregabalin   150 mg Oral TID   senna-docusate  1 tablet Oral BID   sodium chloride  flush  10-40 mL Intracatheter Q12H   sucralfate   1 g Oral BID AC   Continuous Infusions:   PRN Meds:.diphenhydrAMINE , fluticasone , naloxone  **AND** sodium chloride  flush, ondansetron  (ZOFRAN ) IV, oxyCODONE , polyethylene glycol, sodium  chloride flush  Consultants: None  Procedures: None  Antibiotics: None  Assessment/Plan: Principal Problem:   Sickle cell anemia with crisis (HCC) Active Problems:   Chronic pain syndrome   Leucocytosis   Hb Sickle Cell Disease with Pain crisis: Continue IVF 0.45% Saline @ KVO mls/hour, continue weight based Dilaudid  PCA, continue oral home pain medications as ordered. Monitor vitals very closely, Re-evaluate pain scale regularly, 2 L of Oxygen by [place]. Patient encouraged to ambulate on the hallway today.  Leukocytosis: Elevated due to vaso-occlusive crisis. No S/S of acute infection .  Anemia of Chronic Disease: hgb at 7.9 below patients baseline. Will continue to monitor daily cbc and transfuse as appropriate. Chronic pain Syndrome:  continue oral home pain medication   Code Status: Full Code Family Communication: N/A Disposition Plan: Not yet ready for discharge  Lorel Roes NP   If 7PM-7AM, please contact night-coverage.  10/18/2023, 11:54 AM  LOS: 2 days

## 2023-10-19 LAB — CBC
HCT: 19.7 % — ABNORMAL LOW (ref 36.0–46.0)
Hemoglobin: 6.9 g/dL — CL (ref 12.0–15.0)
MCH: 33 pg (ref 26.0–34.0)
MCHC: 35 g/dL (ref 30.0–36.0)
MCV: 94.3 fL (ref 80.0–100.0)
Platelets: 371 10*3/uL (ref 150–400)
RBC: 2.09 MIL/uL — ABNORMAL LOW (ref 3.87–5.11)
RDW: 16.8 % — ABNORMAL HIGH (ref 11.5–15.5)
WBC: 21.7 10*3/uL — ABNORMAL HIGH (ref 4.0–10.5)
nRBC: 0.7 % — ABNORMAL HIGH (ref 0.0–0.2)

## 2023-10-19 LAB — TYPE AND SCREEN
ABO/RH(D): B POS
Antibody Screen: NEGATIVE

## 2023-10-19 MED ORDER — HYDROXYZINE HCL 10 MG PO TABS
10.0000 mg | ORAL_TABLET | Freq: Once | ORAL | Status: AC | PRN
Start: 2023-10-19 — End: 2023-10-19
  Administered 2023-10-19: 10 mg via ORAL
  Filled 2023-10-19: qty 1

## 2023-10-19 NOTE — Progress Notes (Signed)
   10/19/23 0550  Provider Notification  Provider Name/Title Sharion Davidson, NP  Date Provider Notified 10/19/23  Time Provider Notified [phone]  Method of Notification Page  Notification Reason Critical Result  Test performed and critical result Hgb 6.9  Date Critical Result Received 10/19/23  Time Critical Result Received 0544  Provider response Other (Comment) (aware)  Date of Provider Response 10/19/23  Time of Provider Response [phone]

## 2023-10-19 NOTE — Progress Notes (Signed)
 Patients hemoglobin is 6.9 this morning. NP Ija[place] notified. Type and screen ordered-RN attempted to draw labs and patient says her hemoglobin is her baseline and theres no need for a type and screen because she is refusing blood at this time. NP Notified.

## 2023-10-19 NOTE — Plan of Care (Signed)
  Problem: Clinical Measurements: Goal: Diagnostic test results will improve Outcome: Progressing   Problem: Activity: Goal: Risk for activity intolerance will decrease Outcome: Progressing   Problem: Elimination: Goal: Will not experience complications related to bowel motility Outcome: Progressing Goal: Will not experience complications related to urinary retention Outcome: Progressing   Problem: Pain Managment: Goal: General experience of comfort will improve and/or be controlled Outcome: Progressing   Problem: Safety: Goal: Ability to remain free from injury will improve Outcome: Progressing

## 2023-10-19 NOTE — Progress Notes (Addendum)
 Patient ID: ACADIA THAMMAVONG, female   DOB: Jan 06, 2006, 18 y.o.   MRN: [id number] Subjective: Marisue Canion  is a 18 y.o. female with medical history significant of sickle cell disease, PTSD, depression with anxiety who usually gets her care at [facility] but was seen at [facility] for the last 24 hours where she stayed in the ER with sickle cell pain crisis.  Patient was managed in the ER for crisis however her pain was not controlled. Patient was transferred to our care for continued sickle cell crisis pain control. Patient reported to have pain similar to her typical sickle cell crisis. Is mainly in her legs and back.  Patient continues to report pain 8/10 this morning.   Objective:  Vital signs in last 24 hours:  Vitals:   10/19/23 0147 10/19/23 0354 10/19/23 0537 10/19/23 0811  BP: 126/81  135/77   Pulse: (!) 107  96   Resp: 16 16 16 16   Temp: 98.3 F (36.8 C)  98.2 F (36.8 C)   TempSrc: Oral  Oral   SpO2: 98%  98%   Weight:      Height:        Intake/Output from previous day:   Intake/Output Summary (Last 24 hours) at 10/19/2023 1220 Last data filed at 10/19/2023 0400 Gross per 24 hour  Intake 240 ml  Output 1000 ml  Net -760 ml     Physical Exam: General: Alert, awake, oriented x3, in no acute distress.  HEENT: [place]/AT PEERL, EOMI Neck: Trachea midline,  no masses, no thyromegal,y no JVD, no carotid bruit OROPHARYNX:  Moist, No exudate/ erythema/lesions.  Heart: Regular rate and rhythm, without murmurs, rubs, gallops, PMI non-displaced, no heaves or thrills on palpation.  Lungs: Clear to auscultation, no wheezing or rhonchi noted. No increased vocal fremitus resonant to percussion  Abdomen: Soft, nontender, nondistended, positive bowel sounds, no masses no hepatosplenomegaly noted..  Neuro: No focal neurological deficits noted cranial nerves II through XII grossly intact. DTRs 2+ bilaterally upper and lower extremities. Strength 5 out of 5 in bilateral  upper and lower extremities. Musculoskeletal: Bilateral upper and lower extremity tenderness Psychiatric: Patient alert and oriented x3, good insight and cognition, good recent to remote recall. Lymph node survey: No cervical axillary or inguinal lymphadenopathy noted.  Lab Results:  Basic Metabolic Panel:    Component Value Date/Time   NA 137 10/16/2023 0730   NA 135 05/01/2013 1440   K 3.5 10/16/2023 0730   K 4.3 05/01/2013 1440   CL 106 10/16/2023 0730   CL 102 05/01/2013 1440   CO2 21 (L) 10/16/2023 0730   CO2 27 (H) 05/01/2013 1440   BUN 10 10/16/2023 0730   BUN 13 05/01/2013 1440   CREATININE 0.55 10/16/2023 0730   CREATININE 0.40 (L) 05/01/2013 1440   GLUCOSE 100 (H) 10/16/2023 0730   GLUCOSE 108 (H) 05/01/2013 1440   CALCIUM 9.6 10/16/2023 0730   CALCIUM 9.8 05/01/2013 1440   CBC:    Component Value Date/Time   WBC 21.7 (H) 10/19/2023 0537   HGB 6.9 (LL) 10/19/2023 0537   HGB 6.6 (L) 05/01/2013 1440   HCT 19.7 (L) 10/19/2023 0537   HCT 20.1 (L) 05/01/2013 1440   PLT 371 10/19/2023 0537   PLT 468 (H) 05/01/2013 1440   MCV 94.3 10/19/2023 0537   MCV 89 05/01/2013 1440   NEUTROABS 9.0 (H) 08/26/2023 1400   NEUTROABS 7.9 05/30/2012 1958   LYMPHSABS 1.1 08/26/2023 1400   LYMPHSABS 5.0 05/30/2012 1958  MONOABS 1.0 08/26/2023 1400   MONOABS 1.6 (H) 05/30/2012 1958   EOSABS 0.4 08/26/2023 1400   EOSABS 0.6 05/30/2012 1958   BASOSABS 0.1 08/26/2023 1400   BASOSABS 3 04/28/2013 1736   BASOSABS 0.2 (H) 05/30/2012 1958    Recent Results (from the past 240 hours)  Resp panel by RT-PCR (RSV, Flu A&B, Covid) Anterior Nasal Swab     Status: None   Collection Time: 10/16/23  9:33 AM   Specimen: Anterior Nasal Swab  Result Value Ref Range Status   SARS Coronavirus 2 by RT PCR NEGATIVE NEGATIVE Final    Comment: (NOTE) SARS-CoV-2 target nucleic acids are NOT DETECTED.  The SARS-CoV-2 RNA is generally detectable in upper respiratory specimens during the acute phase  of infection. The lowest concentration of SARS-CoV-2 viral copies this assay can detect is 138 copies/mL. A negative result does not preclude SARS-Cov-2 infection and should not be used as the sole basis for treatment or other patient management decisions. A negative result may occur with  improper specimen collection/handling, submission of specimen other than nasopharyngeal swab, presence of viral mutation(s) within the areas targeted by this assay, and inadequate number of viral copies(<138 copies/mL). A negative result must be combined with clinical observations, patient history, and epidemiological information. The expected result is Negative.  Fact Sheet for Patients:  BloggerCourse.com  Fact Sheet for Healthcare Providers:  SeriousBroker.it  This test is no t yet approved or cleared by the [place]  FDA and  has been authorized for detection and/or diagnosis of SARS-CoV-2 by FDA under an Emergency Use Authorization (EUA). This EUA will remain  in effect (meaning this test can be used) for the duration of the COVID-19 declaration under Section 564(b)(1) of the Act, 21 U.S.C.section 360bbb-3(b)(1), unless the authorization is terminated  or revoked sooner.       Influenza A by PCR NEGATIVE NEGATIVE Final   Influenza B by PCR NEGATIVE NEGATIVE Final    Comment: (NOTE) The Xpert Xpress SARS-CoV-2/FLU/RSV plus assay is intended as an aid in the diagnosis of influenza from Nasopharyngeal swab specimens and should not be used as a sole basis for treatment. Nasal washings and aspirates are unacceptable for Xpert Xpress SARS-CoV-2/FLU/RSV testing.  Fact Sheet for Patients: BloggerCourse.com  Fact Sheet for Healthcare Providers: SeriousBroker.it  This test is not yet approved or cleared by the [place]  FDA and has been authorized for detection and/or diagnosis of  SARS-CoV-2 by FDA under an Emergency Use Authorization (EUA). This EUA will remain in effect (meaning this test can be used) for the duration of the COVID-19 declaration under Section 564(b)(1) of the Act, 21 U.S.C. section 360bbb-3(b)(1), unless the authorization is terminated or revoked.     Resp Syncytial Virus by PCR NEGATIVE NEGATIVE Final    Comment: (NOTE) Fact Sheet for Patients: BloggerCourse.com  Fact Sheet for Healthcare Providers: SeriousBroker.it  This test is not yet approved or cleared by the [place]  FDA and has been authorized for detection and/or diagnosis of SARS-CoV-2 by FDA under an Emergency Use Authorization (EUA). This EUA will remain in effect (meaning this test can be used) for the duration of the COVID-19 declaration under Section 564(b)(1) of the Act, 21 U.S.C. section 360bbb-3(b)(1), unless the authorization is terminated or revoked.  Performed at [facility], [address] [place], [place] [address]     Studies/Results: No results found.   Medications: Scheduled Meds:  Deferasirox  360 mg Oral BID   enoxaparin  (LOVENOX ) injection  40 mg Subcutaneous  Q24H   fluticasone  furoate-vilanterol  1 puff Inhalation Daily   HYDROmorphone    Intravenous Q4H   lubiprostone   8 mcg Oral BID WC   pantoprazole   40 mg Oral BID   prazosin   1 mg Oral QHS   pregabalin   150 mg Oral TID   senna-docusate  1 tablet Oral BID   sodium chloride  flush  10-40 mL Intracatheter Q12H   sucralfate   1 g Oral BID AC   Continuous Infusions:   PRN Meds:.diphenhydrAMINE , fluticasone , naloxone  **AND** sodium chloride  flush, ondansetron  (ZOFRAN ) IV, oxyCODONE , polyethylene glycol, sodium chloride  flush  Consultants: None  Procedures: None  Antibiotics: None  Assessment/Plan: Principal Problem:   Sickle cell anemia with crisis (HCC) Active Problems:   Chronic pain syndrome   Leucocytosis   GAD  (generalized anxiety disorder)   Mild intermittent asthma without complication   Hb Sickle Cell Disease with Pain crisis: Continue IVF 0.45% Saline @ KVO mls/hour, continue weight based Dilaudid  PCA, continue oral home pain medications as ordered. Monitor vitals very closely, Re-evaluate pain scale regularly, 2 L of Oxygen by [place]. Patient encouraged to ambulate on the hallway today.  Leukocytosis: Elevated due to vaso-occlusive crisis. No S/S of acute infection .  Anemia of Chronic Disease: hgb at 6.9 g/dl  below patients baseline. Cross and type completed. Will continue to monitor daily cbc and transfuse as appropriate. Chronic pain Syndrome: continue oral home pain medication GAD (generalized anxiety disorder): Stable: continue medication and current treatment.  Mild intermittent asthma without complication: Stable no acute excerebration. Continue Symbicort as needed   Code Status: Full Code Family Communication: N/A Disposition Plan: Not yet ready for discharge  Lorel Roes NP   If 7PM-7AM, please contact night-coverage.  10/19/2023, 12:20 PM  LOS: 3 days

## 2023-10-20 LAB — CBC
HCT: 20.8 % — ABNORMAL LOW (ref 36.0–46.0)
Hemoglobin: 7.1 g/dL — ABNORMAL LOW (ref 12.0–15.0)
MCH: 33.2 pg (ref 26.0–34.0)
MCHC: 34.1 g/dL (ref 30.0–36.0)
MCV: 97.2 fL (ref 80.0–100.0)
Platelets: 430 10*3/uL — ABNORMAL HIGH (ref 150–400)
RBC: 2.14 MIL/uL — ABNORMAL LOW (ref 3.87–5.11)
RDW: 18.9 % — ABNORMAL HIGH (ref 11.5–15.5)
WBC: 19.2 10*3/uL — ABNORMAL HIGH (ref 4.0–10.5)
nRBC: 3.3 % — ABNORMAL HIGH (ref 0.0–0.2)

## 2023-10-20 NOTE — Plan of Care (Signed)
  Problem: Clinical Measurements: Goal: Will remain free from infection Outcome: Progressing   Problem: Elimination: Goal: Will not experience complications related to bowel motility Outcome: Progressing Goal: Will not experience complications related to urinary retention Outcome: Progressing   Problem: Pain Managment: Goal: General experience of comfort will improve and/or be controlled Outcome: Progressing   Problem: Safety: Goal: Ability to remain free from injury will improve Outcome: Progressing   Problem: Skin Integrity: Goal: Risk for impaired skin integrity will decrease Outcome: Progressing

## 2023-10-20 NOTE — Progress Notes (Signed)
 Patient ID: Bailey Reese, female   DOB: 02/08/2006, 18 y.o.   MRN: [id number] Subjective: Bailey Reese  is a 18 y.o. female with medical history significant of sickle cell disease, PTSD, depression with anxiety who usually gets her care at [facility] but was seen at [facility] for the last 24 hours where she stayed in the ER with sickle cell pain crisis.  Patient was managed in the ER for crisis however her pain was not controlled. Patient was transferred to our care for continued sickle cell crisis pain control. Patient reported to have pain similar to her typical sickle cell crisis. Is mainly in her legs and back.  Patient's pain is unchanged this morning at 8/10   Objective:  Vital signs in last 24 hours:  Vitals:   10/20/23 0242 10/20/23 0345 10/20/23 0515 10/20/23 0932  BP:   113/73   Pulse:      Resp: 16 16 16 16   Temp:   97.9 F (36.6 C)   TempSrc:   Oral   SpO2:   99%   Weight:      Height:        Intake/Output from previous day:   Intake/Output Summary (Last 24 hours) at 10/20/2023 1118 Last data filed at 10/19/2023 1300 Gross per 24 hour  Intake 240 ml  Output --  Net 240 ml     Physical Exam: General: Alert, awake, oriented x3, in no acute distress.  HEENT: [place]/AT PEERL, EOMI Neck: Trachea midline,  no masses, no thyromegal,y no JVD, no carotid bruit OROPHARYNX:  Moist, No exudate/ erythema/lesions.  Heart: Regular rate and rhythm, without murmurs, rubs, gallops, PMI non-displaced, no heaves or thrills on palpation.  Lungs: Clear to auscultation, no wheezing or rhonchi noted. No increased vocal fremitus resonant to percussion  Abdomen: Soft, nontender, nondistended, positive bowel sounds, no masses no hepatosplenomegaly noted..  Neuro: No focal neurological deficits noted cranial nerves II through XII grossly intact. DTRs 2+ bilaterally upper and lower extremities. Strength 5 out of 5 in bilateral upper and lower extremities. Musculoskeletal:  Bilateral upper and lower extremity tenderness Psychiatric: Patient alert and oriented x3, good insight and cognition, good recent to remote recall. Lymph node survey: No cervical axillary or inguinal lymphadenopathy noted.  Lab Results:  Basic Metabolic Panel:    Component Value Date/Time   NA 137 10/16/2023 0730   NA 135 05/01/2013 1440   K 3.5 10/16/2023 0730   K 4.3 05/01/2013 1440   CL 106 10/16/2023 0730   CL 102 05/01/2013 1440   CO2 21 (L) 10/16/2023 0730   CO2 27 (H) 05/01/2013 1440   BUN 10 10/16/2023 0730   BUN 13 05/01/2013 1440   CREATININE 0.55 10/16/2023 0730   CREATININE 0.40 (L) 05/01/2013 1440   GLUCOSE 100 (H) 10/16/2023 0730   GLUCOSE 108 (H) 05/01/2013 1440   CALCIUM 9.6 10/16/2023 0730   CALCIUM 9.8 05/01/2013 1440   CBC:    Component Value Date/Time   WBC 19.2 (H) 10/20/2023 0500   HGB 7.1 (L) 10/20/2023 0500   HGB 6.6 (L) 05/01/2013 1440   HCT 20.8 (L) 10/20/2023 0500   HCT 20.1 (L) 05/01/2013 1440   PLT 430 (H) 10/20/2023 0500   PLT 468 (H) 05/01/2013 1440   MCV 97.2 10/20/2023 0500   MCV 89 05/01/2013 1440   NEUTROABS 9.0 (H) 08/26/2023 1400   NEUTROABS 7.9 05/30/2012 1958   LYMPHSABS 1.1 08/26/2023 1400   LYMPHSABS 5.0 05/30/2012 1958   MONOABS 1.0  08/26/2023 1400   MONOABS 1.6 (H) 05/30/2012 1958   EOSABS 0.4 08/26/2023 1400   EOSABS 0.6 05/30/2012 1958   BASOSABS 0.1 08/26/2023 1400   BASOSABS 3 04/28/2013 1736   BASOSABS 0.2 (H) 05/30/2012 1958    Recent Results (from the past 240 hours)  Resp panel by RT-PCR (RSV, Flu A&B, Covid) Anterior Nasal Swab     Status: None   Collection Time: 10/16/23  9:33 AM   Specimen: Anterior Nasal Swab  Result Value Ref Range Status   SARS Coronavirus 2 by RT PCR NEGATIVE NEGATIVE Final    Comment: (NOTE) SARS-CoV-2 target nucleic acids are NOT DETECTED.  The SARS-CoV-2 RNA is generally detectable in upper respiratory specimens during the acute phase of infection. The lowest concentration of  SARS-CoV-2 viral copies this assay can detect is 138 copies/mL. A negative result does not preclude SARS-Cov-2 infection and should not be used as the sole basis for treatment or other patient management decisions. A negative result may occur with  improper specimen collection/handling, submission of specimen other than nasopharyngeal swab, presence of viral mutation(s) within the areas targeted by this assay, and inadequate number of viral copies(<138 copies/mL). A negative result must be combined with clinical observations, patient history, and epidemiological information. The expected result is Negative.  Fact Sheet for Patients:  BloggerCourse.com  Fact Sheet for Healthcare Providers:  SeriousBroker.it  This test is no t yet approved or cleared by the [place]  FDA and  has been authorized for detection and/or diagnosis of SARS-CoV-2 by FDA under an Emergency Use Authorization (EUA). This EUA will remain  in effect (meaning this test can be used) for the duration of the COVID-19 declaration under Section 564(b)(1) of the Act, 21 U.S.C.section 360bbb-3(b)(1), unless the authorization is terminated  or revoked sooner.       Influenza A by PCR NEGATIVE NEGATIVE Final   Influenza B by PCR NEGATIVE NEGATIVE Final    Comment: (NOTE) The Xpert Xpress SARS-CoV-2/FLU/RSV plus assay is intended as an aid in the diagnosis of influenza from Nasopharyngeal swab specimens and should not be used as a sole basis for treatment. Nasal washings and aspirates are unacceptable for Xpert Xpress SARS-CoV-2/FLU/RSV testing.  Fact Sheet for Patients: BloggerCourse.com  Fact Sheet for Healthcare Providers: SeriousBroker.it  This test is not yet approved or cleared by the [place]  FDA and has been authorized for detection and/or diagnosis of SARS-CoV-2 by FDA under an Emergency Use  Authorization (EUA). This EUA will remain in effect (meaning this test can be used) for the duration of the COVID-19 declaration under Section 564(b)(1) of the Act, 21 U.S.C. section 360bbb-3(b)(1), unless the authorization is terminated or revoked.     Resp Syncytial Virus by PCR NEGATIVE NEGATIVE Final    Comment: (NOTE) Fact Sheet for Patients: BloggerCourse.com  Fact Sheet for Healthcare Providers: SeriousBroker.it  This test is not yet approved or cleared by the [place]  FDA and has been authorized for detection and/or diagnosis of SARS-CoV-2 by FDA under an Emergency Use Authorization (EUA). This EUA will remain in effect (meaning this test can be used) for the duration of the COVID-19 declaration under Section 564(b)(1) of the Act, 21 U.S.C. section 360bbb-3(b)(1), unless the authorization is terminated or revoked.  Performed at [facility], [address] [place], [place] [address]     Studies/Results: No results found.   Medications: Scheduled Meds:  Deferasirox  360 mg Oral BID   enoxaparin  (LOVENOX ) injection  40 mg Subcutaneous Q24H  fluticasone  furoate-vilanterol  1 puff Inhalation Daily   HYDROmorphone    Intravenous Q4H   lubiprostone   8 mcg Oral BID WC   pantoprazole   40 mg Oral BID   prazosin   1 mg Oral QHS   pregabalin   150 mg Oral TID   senna-docusate  1 tablet Oral BID   sodium chloride  flush  10-40 mL Intracatheter Q12H   sucralfate   1 g Oral BID AC   Continuous Infusions:   PRN Meds:.diphenhydrAMINE , fluticasone , naloxone  **AND** sodium chloride  flush, ondansetron  (ZOFRAN ) IV, oxyCODONE , polyethylene glycol, sodium chloride  flush  Consultants: None  Procedures: None  Antibiotics: None  Assessment/Plan: Principal Problem:   Sickle cell anemia with crisis (HCC) Active Problems:   Chronic pain syndrome   Leucocytosis   GAD (generalized anxiety disorder)   Mild  intermittent asthma without complication   Hb Sickle Cell Disease with Pain crisis: Continue IVF 0.45% Saline @ KVO mls/hour, continue weight based Dilaudid  PCA, continue oral home pain medications as ordered. Monitor vitals very closely, Re-evaluate pain scale regularly, 2 L of Oxygen by [place]. Patient encouraged to ambulate on the hallway today.  Leukocytosis: Elevated due to vaso-occlusive crisis. No S/S of acute infection .  Anemia of Chronic Disease: hgb at 6.9 g/dl  below patients baseline. Cross and type completed. Will continue to monitor daily cbc and transfuse as appropriate. Chronic pain Syndrome: continue oral home pain medication GAD (generalized anxiety disorder): Stable: continue medication and current treatment.  Mild intermittent asthma without complication: Stable no acute excerebration. Continue Symbicort as needed   Code Status: Full Code Family Communication: N/A Disposition Plan: Not yet ready for discharge  Lorel Roes NP   If 7PM-7AM, please contact night-coverage.  10/20/2023, 11:18 AM  LOS: 4 days

## 2023-10-21 DIAGNOSIS — D5700 Hb-SS disease with crisis, unspecified: Secondary | ICD-10-CM | POA: Diagnosis not present

## 2023-10-21 LAB — CBC
HCT: 22.7 % — ABNORMAL LOW (ref 36.0–46.0)
Hemoglobin: 7.5 g/dL — ABNORMAL LOW (ref 12.0–15.0)
MCH: 33.8 pg (ref 26.0–34.0)
MCHC: 33 g/dL (ref 30.0–36.0)
MCV: 102.3 fL — ABNORMAL HIGH (ref 80.0–100.0)
Platelets: 431 10*3/uL — ABNORMAL HIGH (ref 150–400)
RBC: 2.22 MIL/uL — ABNORMAL LOW (ref 3.87–5.11)
RDW: 20.8 % — ABNORMAL HIGH (ref 11.5–15.5)
WBC: 17.2 10*3/uL — ABNORMAL HIGH (ref 4.0–10.5)
nRBC: 9.3 % — ABNORMAL HIGH (ref 0.0–0.2)

## 2023-10-21 MED ORDER — HYDROXYUREA 500 MG PO CAPS
500.0000 mg | ORAL_CAPSULE | Freq: Two times a day (BID) | ORAL | Status: DC
  Administered 2023-10-21 – 2023-10-23 (×4): 500 mg via ORAL
  Filled 2023-10-21 (×4): qty 1

## 2023-10-21 NOTE — Progress Notes (Signed)
 Patient ID: [name], female   DOB: 2005-06-21, 18 y.o.   MRN: [id number] Subjective: Bailey Reese  is a 18 y.o. female with medical history significant of sickle cell disease, PTSD, depression with anxiety who usually gets her care at [facility] but was seen at [facility] ER and admitted to [facility] for sickle cell pain crisis.    Patient is reporting ongoing significant pain today, saying that she woke up with pain of 8/10 in her lower extremities and lower back, because she has not use her PCA for few hours while sleeping.  She however denies any fever, cough, chest pain, shortness of breath, nausea, vomiting or diarrhea.  No urinary symptoms.  Patient tries to ambulate on the hallway but the activity has incited more pain in her lower extremities.  Objective:  Vital signs in last 24 hours:  Vitals:   10/21/23 0549 10/21/23 0854 10/21/23 0907 10/21/23 0955  BP: 121/60   127/73  Pulse: 90   96  Resp: 14 16  14   Temp: 97.6 F (36.4 C)   98.3 F (36.8 C)  TempSrc: Oral   Oral  SpO2: 96%  96% 96%  Weight:      Height:        Intake/Output from previous day:   Intake/Output Summary (Last 24 hours) at 10/21/2023 1243 Last data filed at 10/20/2023 1543 Gross per 24 hour  Intake 240 ml  Output 900 ml  Net -660 ml     Physical Exam: General: Alert, awake, oriented x3, in no acute distress.  HEENT: [place]/AT PEERL, EOMI Neck: Trachea midline,  no masses, no thyromegal,y no JVD, no carotid bruit OROPHARYNX:  Moist, No exudate/ erythema/lesions.  Heart: Regular rate and rhythm, without murmurs, rubs, gallops, PMI non-displaced, no heaves or thrills on palpation.  Lungs: Clear to auscultation, no wheezing or rhonchi noted. No increased vocal fremitus resonant to percussion  Abdomen: Soft, nontender, nondistended, positive bowel sounds, no masses no hepatosplenomegaly noted..  Neuro: No focal neurological deficits noted cranial nerves II through XII grossly intact.  DTRs 2+ bilaterally upper and lower extremities. Strength 5 out of 5 in bilateral upper and lower extremities. Musculoskeletal: Bilateral upper and lower extremity tenderness Psychiatric: Patient alert and oriented x3, good insight and cognition, good recent to remote recall. Lymph node survey: No cervical axillary or inguinal lymphadenopathy noted.  Lab Results:  Basic Metabolic Panel:    Component Value Date/Time   NA 137 10/16/2023 0730   NA 135 05/01/2013 1440   K 3.5 10/16/2023 0730   K 4.3 05/01/2013 1440   CL 106 10/16/2023 0730   CL 102 05/01/2013 1440   CO2 21 (L) 10/16/2023 0730   CO2 27 (H) 05/01/2013 1440   BUN 10 10/16/2023 0730   BUN 13 05/01/2013 1440   CREATININE 0.55 10/16/2023 0730   CREATININE 0.40 (L) 05/01/2013 1440   GLUCOSE 100 (H) 10/16/2023 0730   GLUCOSE 108 (H) 05/01/2013 1440   CALCIUM 9.6 10/16/2023 0730   CALCIUM 9.8 05/01/2013 1440   CBC:    Component Value Date/Time   WBC 17.2 (H) 10/21/2023 1158   HGB 7.5 (L) 10/21/2023 1158   HGB 6.6 (L) 05/01/2013 1440   HCT 22.7 (L) 10/21/2023 1158   HCT 20.1 (L) 05/01/2013 1440   PLT 431 (H) 10/21/2023 1158   PLT 468 (H) 05/01/2013 1440   MCV 102.3 (H) 10/21/2023 1158   MCV 89 05/01/2013 1440   NEUTROABS 9.0 (H) 08/26/2023 1400  NEUTROABS 7.9 05/30/2012 1958   LYMPHSABS 1.1 08/26/2023 1400   LYMPHSABS 5.0 05/30/2012 1958   MONOABS 1.0 08/26/2023 1400   MONOABS 1.6 (H) 05/30/2012 1958   EOSABS 0.4 08/26/2023 1400   EOSABS 0.6 05/30/2012 1958   BASOSABS 0.1 08/26/2023 1400   BASOSABS 3 04/28/2013 1736   BASOSABS 0.2 (H) 05/30/2012 1958    Recent Results (from the past 240 hours)  Resp panel by RT-PCR (RSV, Flu A&B, Covid) Anterior Nasal Swab     Status: None   Collection Time: 10/16/23  9:33 AM   Specimen: Anterior Nasal Swab  Result Value Ref Range Status   SARS Coronavirus 2 by RT PCR NEGATIVE NEGATIVE Final    Comment: (NOTE) SARS-CoV-2 target nucleic acids are NOT DETECTED.  The  SARS-CoV-2 RNA is generally detectable in upper respiratory specimens during the acute phase of infection. The lowest concentration of SARS-CoV-2 viral copies this assay can detect is 138 copies/mL. A negative result does not preclude SARS-Cov-2 infection and should not be used as the sole basis for treatment or other patient management decisions. A negative result may occur with  improper specimen collection/handling, submission of specimen other than nasopharyngeal swab, presence of viral mutation(s) within the areas targeted by this assay, and inadequate number of viral copies(<138 copies/mL). A negative result must be combined with clinical observations, patient history, and epidemiological information. The expected result is Negative.  Fact Sheet for Patients:  BloggerCourse.com  Fact Sheet for Healthcare Providers:  SeriousBroker.it  This test is no t yet approved or cleared by the [place]  FDA and  has been authorized for detection and/or diagnosis of SARS-CoV-2 by FDA under an Emergency Use Authorization (EUA). This EUA will remain  in effect (meaning this test can be used) for the duration of the COVID-19 declaration under Section 564(b)(1) of the Act, 21 U.S.C.section 360bbb-3(b)(1), unless the authorization is terminated  or revoked sooner.       Influenza A by PCR NEGATIVE NEGATIVE Final   Influenza B by PCR NEGATIVE NEGATIVE Final    Comment: (NOTE) The Xpert Xpress SARS-CoV-2/FLU/RSV plus assay is intended as an aid in the diagnosis of influenza from Nasopharyngeal swab specimens and should not be used as a sole basis for treatment. Nasal washings and aspirates are unacceptable for Xpert Xpress SARS-CoV-2/FLU/RSV testing.  Fact Sheet for Patients: BloggerCourse.com  Fact Sheet for Healthcare Providers: SeriousBroker.it  This test is not yet approved or  cleared by the [place]  FDA and has been authorized for detection and/or diagnosis of SARS-CoV-2 by FDA under an Emergency Use Authorization (EUA). This EUA will remain in effect (meaning this test can be used) for the duration of the COVID-19 declaration under Section 564(b)(1) of the Act, 21 U.S.C. section 360bbb-3(b)(1), unless the authorization is terminated or revoked.     Resp Syncytial Virus by PCR NEGATIVE NEGATIVE Final    Comment: (NOTE) Fact Sheet for Patients: BloggerCourse.com  Fact Sheet for Healthcare Providers: SeriousBroker.it  This test is not yet approved or cleared by the [place]  FDA and has been authorized for detection and/or diagnosis of SARS-CoV-2 by FDA under an Emergency Use Authorization (EUA). This EUA will remain in effect (meaning this test can be used) for the duration of the COVID-19 declaration under Section 564(b)(1) of the Act, 21 U.S.C. section 360bbb-3(b)(1), unless the authorization is terminated or revoked.  Performed at [facility], [address] [place], [place] [address]     Studies/Results: No results found.   Medications:  Scheduled Meds:  Deferasirox   360 mg Oral BID   enoxaparin  (LOVENOX ) injection  40 mg Subcutaneous Q24H   fluticasone  furoate-vilanterol  1 puff Inhalation Daily   HYDROmorphone    Intravenous Q4H   lubiprostone   8 mcg Oral BID WC   pantoprazole   40 mg Oral BID   prazosin   1 mg Oral QHS   pregabalin   150 mg Oral TID   senna-docusate  1 tablet Oral BID   sodium chloride  flush  10-40 mL Intracatheter Q12H   sucralfate   1 g Oral BID AC   Continuous Infusions:   PRN Meds:.diphenhydrAMINE , fluticasone , naloxone  **AND** sodium chloride  flush, ondansetron  (ZOFRAN ) IV, oxyCODONE , polyethylene glycol, sodium chloride  flush  Consultants: None  Procedures: None  Antibiotics: None  Assessment/Plan: Principal Problem:   Sickle cell  anemia with crisis (HCC) Active Problems:   Chronic pain syndrome   Leucocytosis   GAD (generalized anxiety disorder)   Mild intermittent asthma without complication  Hb Sickle Cell Disease with Pain crisis: Continue IVF @ KVO mls/hour, continue weight based Dilaudid  PCA at current dose setting, continue oral home pain medications as ordered. Monitor vitals very closely, Re-evaluate pain scale regularly, 2 L of Oxygen by [place]. Patient encouraged to ambulate on the hallway today.  Leukocytosis: Improving.  Most likely elevated due to vaso-occlusive crisis.  Will continue to monitor closely without antibiotics at this time. Anemia of Chronic Disease: Hemoglobin is now at baseline of 7.5.  Patient has been transfused during this admission with 1 unit of packed red blood cells.  Will continue to monitor closely and transfuse as appropriate.  Chronic pain Syndrome: continue oral home pain medication as ordered. GAD (generalized anxiety disorder): Clinically stable.  Patient denies any suicidal ideations or thoughts.  Patient counseled extensively. Continue medication and current treatment.  Mild intermittent asthma without complication: Stable, no acute excerebration. Continue Symbicort as needed  Code Status: Full Code Family Communication: N/A Disposition Plan: Not yet ready for discharge  Dekisha Mesmer NP   If 7PM-7AM, please contact night-coverage.  10/21/2023, 12:43 PM  LOS: 5 days

## 2023-10-21 NOTE — Plan of Care (Signed)
  Problem: Health Behavior/Discharge Planning: Goal: Ability to manage health-related needs will improve Outcome: Progressing   Problem: Clinical Measurements: Goal: Ability to maintain clinical measurements within normal limits will improve Outcome: Progressing Goal: Will remain free from infection Outcome: Progressing   Problem: Activity: Goal: Risk for activity intolerance will decrease Outcome: Progressing   Problem: Nutrition: Goal: Adequate nutrition will be maintained Outcome: Progressing   

## 2023-10-22 DIAGNOSIS — D5700 Hb-SS disease with crisis, unspecified: Secondary | ICD-10-CM | POA: Diagnosis not present

## 2023-10-22 LAB — CBC
HCT: 21.6 % — ABNORMAL LOW (ref 36.0–46.0)
Hemoglobin: 6.9 g/dL — CL (ref 12.0–15.0)
MCH: 33.2 pg (ref 26.0–34.0)
MCHC: 31.9 g/dL (ref 30.0–36.0)
MCV: 103.8 fL — ABNORMAL HIGH (ref 80.0–100.0)
Platelets: 400 10*3/uL (ref 150–400)
RBC: 2.08 MIL/uL — ABNORMAL LOW (ref 3.87–5.11)
RDW: 20.5 % — ABNORMAL HIGH (ref 11.5–15.5)
WBC: 12.1 10*3/uL — ABNORMAL HIGH (ref 4.0–10.5)
nRBC: 12.4 % — ABNORMAL HIGH (ref 0.0–0.2)

## 2023-10-22 LAB — BASIC METABOLIC PANEL WITH GFR
Anion gap: 7 (ref 5–15)
BUN: <5 mg/dL — ABNORMAL LOW (ref 6–20)
CO2: 24 mmol/L (ref 22–32)
Calcium: 8.7 mg/dL — ABNORMAL LOW (ref 8.9–10.3)
Chloride: 109 mmol/L (ref 98–111)
Creatinine, Ser: 0.45 mg/dL (ref 0.44–1.00)
GFR, Estimated: >60 mL/min (ref >60)
Glucose, Bld: 120 mg/dL — ABNORMAL HIGH (ref 70–99)
Potassium: 3.4 mmol/L — ABNORMAL LOW (ref 3.5–5.1)
Sodium: 140 mmol/L (ref 135–145)

## 2023-10-22 MED ORDER — POLYVINYL ALCOHOL 1.4 % OP SOLN
1.0000 [drp] | OPHTHALMIC | Status: DC | PRN
  Filled 2023-10-22: qty 15

## 2023-10-22 NOTE — Progress Notes (Signed)
 Unwitnessed pt fall. Pt up to bathroom, previously not a high fall risk, however will call for help as needed r/t sickle cell crisis pain affecting mobility. Pt used call cord in bathroom to call nurses station and stated she had fallen. Staff responded to call and found pt on bathroom floor. No LOC. Pt states she fell on her right hip and right flank and hit head "lightly" per pt report. No visible injuries noted. VS obtained and WNL. MD made aware.   10/22/23 1415  What Happened  Was fall witnessed? No  Was patient injured? No  Patient found in bathroom  Found by No one-pt stated they fell  Stated prior activity bathroom-unassisted  Provider Notification  Provider Name/Title Dr. Drake Gens  Date Provider Notified 10/22/23  Time Provider Notified 1415  Method of Notification Page  Notification Reason Fall  Provider response Evaluate remotely  Date of Provider Response 10/22/23  Time of Provider Response 1416  Follow Up  Family notified No - patient refusal  Additional tests No  Progress note created (see row info) Yes  Adult Fall Risk Assessment  Risk Factor Category (scoring not indicated) Fall has occurred during this admission (document High fall risk)  Age 19  Fall History: Fall within 6 months prior to admission 0  Elimination; Bowel and/or Urine Incontinence 0  Elimination; Bowel and/or Urine Urgency/Frequency 0  Medications: includes PCA/Opiates, Anti-convulsants, Anti-hypertensives, Diuretics, Hypnotics, Laxatives, Sedatives, and Psychotropics 5  Patient Care Equipment 2  Mobility-Assistance 0  Mobility-Gait 0  Mobility-Sensory Deficit 0  Altered awareness of immediate physical environment 0  Impulsiveness 0  Lack of understanding of one's physical/cognitive limitations 0  Total Score 7  Adult Fall Risk Interventions  Required Bundle Interventions *See Row Information* High fall risk - low, moderate, and high requirements implemented  Additional Interventions Use of  appropriate toileting equipment (bedpan, BSC, etc.)  Fall intervention(s) refused/Patient educated regarding refusal Bed alarm;Nonskid socks;Open door if unsupervised;Yellow bracelet;Supervision while toileting/edge of bed sitting  Screening for Fall Injury Risk (To be completed on HIGH fall risk patients) - Assessing Need for Floor Mats  Risk For Fall Injury- Criteria for Floor Mats Previous fall this admission  Will Implement Floor Mats Yes  Vitals  Temp 98.5 F (36.9 C)  Temp Source Oral  BP (!) 169/93  MAP (mmHg) 110  BP Location Right Arm  BP Method Automatic  Patient Position (if appropriate) Lying  Pulse Rate 99  Pulse Rate Source [place]  Resp 16  Oxygen Therapy  SpO2 98 %  O2 Device Room Air  Patient Activity (if Appropriate) In bed  Pain Assessment  Pain Scale 0-10  Pain Score 9  Pain Type Acute pain  Pain Location Generalized  Pain Descriptors / Indicators Aching  Pain Frequency Constant  Pain Onset On-going  Patients Stated Pain Goal 2  Pain Intervention(s) PCA encouraged  PCA/Epidural/Spinal Assessment  Respiratory Pattern Regular;Unlabored  Patient/Family Education Done  IS - Achieved (mL) (RN, NT, or RT) 0 mL  Neurological  Neuro (WDL) WDL  Level of Consciousness Alert  Neuro Additional Assessments Glasgow Coma Scale  Glasgow Coma Scale  Eye Opening 4  Best Verbal Response (NON-intubated) 5  Best Motor Response 6  Glasgow Coma Scale Score 15  Musculoskeletal  Musculoskeletal (WDL) X  Assistive Device None  Generalized Weakness Yes  Weight Bearing Restrictions Per Provider Order No  Integumentary  Integumentary (WDL) X  Skin Color Appropriate for ethnicity  Skin Condition Itching  Itching intervention benadryl  prn  Skin Integrity  Intact  Skin Turgor Non-tenting  Pain Assessment  Date Pain First Started 10/16/23  Result of Injury No  Pain Assessment  Work-Related Injury No  Pain Screening  Clinical Progression Not changed  Effect of Pain on  Daily Activities effects daily activities heavily  Response to Interventions respsonds well to pain interventions

## 2023-10-22 NOTE — Progress Notes (Signed)
 Patient ID: Bailey Reese, female   DOB: [birth year], 18 y.o.   MRN: [id number] Subjective: Bailey Reese  is a 18 y.o. female with medical history significant of sickle cell disease, PTSD, depression with anxiety who usually gets her care at [facility] but was seen at [facility] ER and admitted to [facility] for sickle cell pain crisis.    Patient has no new complaints today except that her pain has not really improved significantly.  She is continues to report pain 8/10, saying her pain is still in her lower extremities and lower back.  She has not been ambulating as she showed this close to going home.  Patient is requesting 1 more night in the hospital.  She denies any fever, cough, chest pain, shortness of breath, nausea, vomiting or diarrhea.  No dizziness.  No urinary symptoms.  Objective:  Vital signs in last 24 hours:  Vitals:   10/22/23 0546 10/22/23 0731 10/22/23 1229 10/22/23 1342  BP: 127/69     Pulse: 92     Resp: 16 15 15 16   Temp: 98.7 F (37.1 C)     TempSrc: Oral     SpO2: 100% 100% 100% 100%  Weight:      Height:        Intake/Output from previous day:   Intake/Output Summary (Last 24 hours) at 10/22/2023 1409 Last data filed at 10/22/2023 1341 Gross per 24 hour  Intake 977.5 ml  Output 1200 ml  Net -222.5 ml     Physical Exam: General: Alert, awake, oriented x3, in no acute distress.  HEENT: [place]/AT PEERL, EOMI Neck: Trachea midline,  no masses, no thyromegal,y no JVD, no carotid bruit OROPHARYNX:  Moist, No exudate/ erythema/lesions.  Heart: Regular rate and rhythm, without murmurs, rubs, gallops, PMI non-displaced, no heaves or thrills on palpation.  Lungs: Clear to auscultation, no wheezing or rhonchi noted. No increased vocal fremitus resonant to percussion  Abdomen: Soft, nontender, nondistended, positive bowel sounds, no masses no hepatosplenomegaly noted..  Neuro: No focal neurological deficits noted cranial nerves II through XII  grossly intact. DTRs 2+ bilaterally upper and lower extremities. Strength 5 out of 5 in bilateral upper and lower extremities. Musculoskeletal: Bilateral upper and lower extremity tenderness Psychiatric: Patient alert and oriented x3, good insight and cognition, good recent to remote recall. Lymph node survey: No cervical axillary or inguinal lymphadenopathy noted.  Lab Results:  Basic Metabolic Panel:    Component Value Date/Time   NA 140 10/22/2023 0654   NA 135 05/01/2013 1440   K 3.4 (L) 10/22/2023 0654   K 4.3 05/01/2013 1440   CL 109 10/22/2023 0654   CL 102 05/01/2013 1440   CO2 24 10/22/2023 0654   CO2 27 (H) 05/01/2013 1440   BUN <5 (L) 10/22/2023 0654   BUN 13 05/01/2013 1440   CREATININE 0.45 10/22/2023 0654   CREATININE 0.40 (L) 05/01/2013 1440   GLUCOSE 120 (H) 10/22/2023 0654   GLUCOSE 108 (H) 05/01/2013 1440   CALCIUM 8.7 (L) 10/22/2023 0654   CALCIUM 9.8 05/01/2013 1440   CBC:    Component Value Date/Time   WBC 12.1 (H) 10/22/2023 0654   HGB 6.9 (LL) 10/22/2023 0654   HGB 6.6 (L) 05/01/2013 1440   HCT 21.6 (L) 10/22/2023 0654   HCT 20.1 (L) 05/01/2013 1440   PLT 400 10/22/2023 0654   PLT 468 (H) 05/01/2013 1440   MCV 103.8 (H) 10/22/2023 0654   MCV 89 05/01/2013 1440   NEUTROABS 9.0 (  H) 08/26/2023 1400   NEUTROABS 7.9 05/30/2012 1958   LYMPHSABS 1.1 08/26/2023 1400   LYMPHSABS 5.0 05/30/2012 1958   MONOABS 1.0 08/26/2023 1400   MONOABS 1.6 (H) 05/30/2012 1958   EOSABS 0.4 08/26/2023 1400   EOSABS 0.6 05/30/2012 1958   BASOSABS 0.1 08/26/2023 1400   BASOSABS 3 04/28/2013 1736   BASOSABS 0.2 (H) 05/30/2012 1958    Recent Results (from the past 240 hours)  Resp panel by RT-PCR (RSV, Flu A&B, Covid) Anterior Nasal Swab     Status: None   Collection Time: 10/16/23  9:33 AM   Specimen: Anterior Nasal Swab  Result Value Ref Range Status   SARS Coronavirus 2 by RT PCR NEGATIVE NEGATIVE Final    Comment: (NOTE) SARS-CoV-2 target nucleic acids are NOT  DETECTED.  The SARS-CoV-2 RNA is generally detectable in upper respiratory specimens during the acute phase of infection. The lowest concentration of SARS-CoV-2 viral copies this assay can detect is 138 copies/mL. A negative result does not preclude SARS-Cov-2 infection and should not be used as the sole basis for treatment or other patient management decisions. A negative result may occur with  improper specimen collection/handling, submission of specimen other than nasopharyngeal swab, presence of viral mutation(s) within the areas targeted by this assay, and inadequate number of viral copies(<138 copies/mL). A negative result must be combined with clinical observations, patient history, and epidemiological information. The expected result is Negative.  Fact Sheet for Patients:  BloggerCourse.com  Fact Sheet for Healthcare Providers:  SeriousBroker.it  This test is no t yet approved or cleared by the [place]  FDA and  has been authorized for detection and/or diagnosis of SARS-CoV-2 by FDA under an Emergency Use Authorization (EUA). This EUA will remain  in effect (meaning this test can be used) for the duration of the COVID-19 declaration under Section 564(b)(1) of the Act, 21 U.S.C.section 360bbb-3(b)(1), unless the authorization is terminated  or revoked sooner.       Influenza A by PCR NEGATIVE NEGATIVE Final   Influenza B by PCR NEGATIVE NEGATIVE Final    Comment: (NOTE) The Xpert Xpress SARS-CoV-2/FLU/RSV plus assay is intended as an aid in the diagnosis of influenza from Nasopharyngeal swab specimens and should not be used as a sole basis for treatment. Nasal washings and aspirates are unacceptable for Xpert Xpress SARS-CoV-2/FLU/RSV testing.  Fact Sheet for Patients: BloggerCourse.com  Fact Sheet for Healthcare Providers: SeriousBroker.it  This test is not yet  approved or cleared by the [place]  FDA and has been authorized for detection and/or diagnosis of SARS-CoV-2 by FDA under an Emergency Use Authorization (EUA). This EUA will remain in effect (meaning this test can be used) for the duration of the COVID-19 declaration under Section 564(b)(1) of the Act, 21 U.S.C. section 360bbb-3(b)(1), unless the authorization is terminated or revoked.     Resp Syncytial Virus by PCR NEGATIVE NEGATIVE Final    Comment: (NOTE) Fact Sheet for Patients: BloggerCourse.com  Fact Sheet for Healthcare Providers: SeriousBroker.it  This test is not yet approved or cleared by the [place]  FDA and has been authorized for detection and/or diagnosis of SARS-CoV-2 by FDA under an Emergency Use Authorization (EUA). This EUA will remain in effect (meaning this test can be used) for the duration of the COVID-19 declaration under Section 564(b)(1) of the Act, 21 U.S.C. section 360bbb-3(b)(1), unless the authorization is terminated or revoked.  Performed at [facility], [address], [place], [place] [address]     Studies/Results: No  results found.   Medications: Scheduled Meds:  Deferasirox   360 mg Oral BID   enoxaparin  (LOVENOX ) injection  40 mg Subcutaneous Q24H   fluticasone  furoate-vilanterol  1 puff Inhalation Daily   HYDROmorphone    Intravenous Q4H   hydroxyurea   500 mg Oral BID   lubiprostone   8 mcg Oral BID WC   pantoprazole   40 mg Oral BID   prazosin   1 mg Oral QHS   pregabalin   150 mg Oral TID   senna-docusate  1 tablet Oral BID   sodium chloride  flush  10-40 mL Intracatheter Q12H   sucralfate   1 g Oral BID AC   Continuous Infusions:   PRN Meds:.artificial tears, diphenhydrAMINE , fluticasone , naloxone  **AND** sodium chloride  flush, ondansetron  (ZOFRAN ) IV, oxyCODONE , polyethylene glycol, sodium chloride   flush  Consultants: None  Procedures: None  Antibiotics: None  Assessment/Plan: Principal Problem:   Sickle cell anemia with crisis (HCC) Active Problems:   Chronic pain syndrome   Leucocytosis   GAD (generalized anxiety disorder)   Mild intermittent asthma without complication  Hb Sickle Cell Disease with Pain crisis: Continue IVF @ KVO mls/hour, continue weight based Dilaudid  PCA at current dose setting, continue oral home pain medications as ordered. Monitor vitals very closely, Re-evaluate pain scale regularly, 2 L of Oxygen by [place]. Patient encouraged to ambulate on the hallway today.  Leukocytosis: Improving.  Most likely elevated due to vaso-occlusive crisis.  Will continue to monitor closely without antibiotics at this time. Anemia of Chronic Disease: Hemoglobin dropped to 6.9 this morning but still close to baseline of between 7 and 8.  Patient has been transfused during this admission with 1 unit of packed red blood cells.  There is no clinical indication for blood transfusion at this time.  Will continue to monitor very closely and transfuse if hemoglobin drops below 6.  Chronic pain Syndrome: continue oral home pain medication as ordered. GAD (generalized anxiety disorder): Clinically stable. Patient denies any suicidal ideations or thoughts. Patient counseled extensively. Continue medication and current treatment.  Mild intermittent asthma without complication: Stable, no acute excerebration. Continue Symbicort as needed  Code Status: Full Code Family Communication: N/A Disposition Plan: For possible discharge home tomorrow morning, 10/23/2023.  Channin Agustin NP   If 7PM-7AM, please contact night-coverage.  10/22/2023, 2:09 PM  LOS: 6 days

## 2023-10-22 NOTE — Progress Notes (Signed)
 Date and time results received: 10/22/23 8:08 AM  Test: hgb   Critical Value: 6.9  Name of Provider Notified: Jegede

## 2023-10-22 NOTE — Progress Notes (Deleted)
 Unwitnessed pt fall. Pt up to bathroom, previously not a high fall risk, however will call for help as needed r/t sickle cell crisis pain affecting mobility. Pt used call cord in bathroom to call nurses station and stated she had fallen. Staff responded to call and found pt on bathroom floor. No LOC. Pt states she fell on her right hip and right flank and hit head "lightly" per pt report. No visible injuries noted. VS obtained and WNL. MD made aware.

## 2023-10-23 LAB — CBC
HCT: 21.2 % — ABNORMAL LOW (ref 36.0–46.0)
Hemoglobin: 7.1 g/dL — ABNORMAL LOW (ref 12.0–15.0)
MCH: 33.2 pg (ref 26.0–34.0)
MCHC: 33.5 g/dL (ref 30.0–36.0)
MCV: 99.1 fL (ref 80.0–100.0)
Platelets: 414 10*3/uL — ABNORMAL HIGH (ref 150–400)
RBC: 2.14 MIL/uL — ABNORMAL LOW (ref 3.87–5.11)
RDW: 19 % — ABNORMAL HIGH (ref 11.5–15.5)
WBC: 12.9 10*3/uL — ABNORMAL HIGH (ref 4.0–10.5)
nRBC: 10 % — ABNORMAL HIGH (ref 0.0–0.2)

## 2023-10-23 NOTE — Progress Notes (Signed)
 Pt discharged to home. Dc instructions given. Verbalized no concerns at discharge. Pt left unit in wheelchair pushed by Matteson, NT. No changes to meds made. Left in stable condition.

## 2023-10-23 NOTE — Discharge Summary (Signed)
 Physician Discharge Summary  Bailey Reese MWN:[id number] DOB: March 23, 2006 DOA: 10/16/2023  PCP: Gladys Lamp, MD  Admit date: 10/16/2023  Discharge date: 10/23/2023  Discharge Diagnoses:  Principal Problem:   Sickle cell anemia with crisis [facility]) Active Problems:   Chronic pain syndrome   Leucocytosis   GAD (generalized anxiety disorder)   Mild intermittent asthma without complication   Discharge Condition: Stable  Disposition:  Pt is discharged home in good condition and is to follow up with Little, Ozell Blunt, MD this week to have labs evaluated. Bailey Reese is instructed to increase activity slowly and balance with rest for the next few days, and use prescribed medication to complete treatment of pain  Diet: Regular Wt Readings from Last 3 Encounters:  10/16/23 68 kg (80%, Z= 0.86)*  10/16/23 65.8 kg (76%, Z= 0.70)*  08/23/23 65.7 kg (76%, Z= 0.71)*   * Growth percentiles are based on CDC (Girls, 2-20 Years) data.    History of present illness:  Bailey Reese  is a 18 y.o. female Bailey Reese  is a 18 y.o. female with medical history significant of sickle cell disease, PTSD, depression with anxiety who usually gets her care at [facility] but was seen at [facility] for the last 24 hours where she stayed in the ER with sickle cell pain crisis.  Patient was managed in the ER for crisis however her pain was not controlled. Patient was transferred to our care for continued sickle cell crisis pain control. Patient reported to have pain similar to her typical sickle cell crisis. Is mainly in her legs and back.  She has vitals that are stable.  Hemoglobin is around 9.6 g/dl.  She was getting IV Dilaudid  pushes in the ER. Patient denied any nausea vomiting or diarrhea.  Denied any viral illness.  She has had previous osteomyelitis and multiple episodes of acute chest syndromes.  Also hemorrhagic stroke in 2017.  Patient has chronic pain from her sickle cell  disease.  No recent travels or sick contacts.    ED course: Patient was treated with IVF, IV pain medication with no resolution to symptoms. She is admitted in patient for ongoing sickle cell pain management crisis WBC: 17.3, Pulse Rate:119, Temperature 99.5  WBC 17.3 (*)        RBC 2.94 (*)      Hemoglobin 9.6 (*)      HCT 27.5 (*)      RDW 16.3 (*)      Platelets 526 (*)      nRBC 0.9 (*)      All other components within normal limits  COMPREHENSIVE METABOLIC PANEL WITH GFR - Abnormal; Notable for the following components:    CO2 21 (*)      Glucose, Bld 100 (*)      Total Protein 8.9 (*)      Total Bilirubin 4.6 (*)      All other components within normal limits  RETICULOCYTES - Abnormal; Notable for the following components:    Retic Ct Pct 15.0 (*)      RBC. 2.95 (*)      Retic Count, Absolute 442.5 (*)      Immature Retic Fract 31.8 (*)      All other components within normal limits  RESP PANEL BY RT-PCR (RSV, FLU A&B, COVID)  [id number]  HCG, QUANTITATIVE, PREGNANCY  URINALYSIS, ROUTINE W REFLEX MICROSCOPIC    Hospital Course:  Patient was admitted for sickle cell pain crisis and  managed appropriately with IVF, IV Dilaudid  via PCA as well as other adjunct therapies per sickle cell pain management protocols.  Patient is reporting improvement to pain.  She is ambulating without assistance.  Tolerating p.o. without nausea or vomiting. Patient was therefore discharged home today in a hemodynamically stable condition.   Bailey Reese will follow-up with PCP within 1 week of this discharge. Bailey Reese was counseled extensively about nonpharmacologic means of pain management, patient verbalized understanding and was appreciative of  the care received during this admission.   We discussed the need for good hydration, monitoring of hydration status, avoidance of heat, cold, stress, and infection triggers. We discussed the need to be adherent with other home medications. Patient was reminded  of the need to seek medical attention immediately if any symptom of bleeding, anemia, or infection occurs.  Discharge Exam: Vitals:   10/23/23 0915 10/23/23 1138  BP:    Pulse:    Resp:  16  Temp:    SpO2: 93%    Vitals:   10/23/23 0542 10/23/23 0819 10/23/23 0915 10/23/23 1138  BP:      Pulse:      Resp: 18 19  16   Temp:      TempSrc:      SpO2:   93%   Weight:      Height:        General appearance : Awake, alert, not in any distress. Speech Clear. Not toxic looking HEENT: Atraumatic and Normocephalic, pupils equally reactive to light and accomodation Neck: Supple, no JVD. No cervical lymphadenopathy.  Chest: Good air entry bilaterally, no added sounds  CVS: S1 S2 regular, no murmurs.  Abdomen: Bowel sounds present, Non tender and not distended with no gaurding, rigidity or rebound. Extremities: B/L Lower Ext shows no edema, both legs are warm to touch Neurology: Awake alert, and oriented X 3, CN II-XII intact, Non focal Skin: No Rash  Discharge Instructions  Discharge Instructions     Call MD for:  severe uncontrolled pain   Complete by: As directed    Call MD for:  temperature >100.4   Complete by: As directed    Diet - low sodium heart healthy   Complete by: As directed    Increase activity slowly   Complete by: As directed       Allergies as of 10/23/2023       Reactions   Cherry Anaphylaxis, Itching, Swelling   Olanzapine  Other (See Comments)   "Drug-induced liver injury"   Other Swelling, Other (See Comments)   "Pitted fruits"   Peanut-containing Drug Products Anaphylaxis, Itching, Swelling   Plum Pulp Anaphylaxis, Hives   Morphine  And Codeine Hives, Itching   Peach [prunus Persica] Itching, Swelling, Other (See Comments)   Acetaminophen  Nausea And Vomiting   Ibuprofen  Nausea And Vomiting        Medication List     TAKE these medications    budesonide-formoterol  80-4.5 MCG/ACT inhaler Commonly known as: SYMBICORT Inhale 2 puffs into the  lungs See admin instructions. Inhale 2 puffs by mouth twice daily and an extra puff as needed.   buprenorphine  20 MCG/HR Ptwk Commonly known as: BUTRANS  Place 1 patch onto the skin once a week.   CALCIUM 600+D3 PO Take 1 tablet by mouth daily.   Deferasirox  360 MG Tabs Take 360 mg by mouth 2 (two) times daily.   ergocalciferol 1.25 MG (50000 UT) capsule Commonly known as: VITAMIN D2 Take 50,000 Units by mouth once a week.   esomeprazole  40  MG capsule Commonly known as: NEXIUM  Take 1 capsule (40 mg total) by mouth daily as needed (reflux).   fexofenadine 180 MG tablet Commonly known as: ALLEGRA Take 180 mg by mouth daily as needed for allergies.   fluticasone  50 MCG/ACT nasal spray Commonly known as: FLONASE  Place 1 spray into both nostrils daily as needed for allergies or rhinitis.   hydroxyurea  500 MG capsule Commonly known as: HYDREA  Take 500 mg by mouth 2 (two) times daily. May take with food to minimize GI side effects.   hydrOXYzine  25 MG tablet Commonly known as: ATARAX  Take 25 mg by mouth See admin instructions. Take 25mg  (1 tablet) by mouth twice daily and an extra tablet if needed.   lubiprostone  8 MCG capsule Commonly known as: AMITIZA  Take 8 mcg by mouth 2 (two) times daily with a meal.   ondansetron  4 MG disintegrating tablet Commonly known as: ZOFRAN -ODT Take 4 mg by mouth every 8 (eight) hours as needed for nausea or vomiting (dissolve orally).   Oxycodone  HCl 10 MG Tabs Take 1 tablet (10 mg total) by mouth every 6 (six) hours as needed (pain).   pantoprazole  40 MG tablet Commonly known as: Protonix  Take 1 tablet (40 mg total) by mouth 2 (two) times daily.   prazosin  1 MG capsule Commonly known as: MINIPRESS  Take 1 mg by mouth at bedtime.   pregabalin  150 MG capsule Commonly known as: LYRICA  Take 150 mg by mouth 3 (three) times daily.   prochlorperazine 5 MG tablet Commonly known as: COMPAZINE Take 5 mg by mouth every 6 (six) hours as needed  for nausea or vomiting.   promethazine 12.5 MG tablet Commonly known as: PHENERGAN Take 12.5 mg by mouth every 6 (six) hours as needed for nausea or vomiting.   Sleep Caps Take 1 capsule by mouth at bedtime.   sucralfate  1 g tablet Commonly known as: CARAFATE  Take 1 tablet (1 g total) by mouth 2 (two) times daily.        The results of significant diagnostics from this hospitalization (including imaging, microbiology, ancillary and laboratory) are listed below for reference.    Significant Diagnostic Studies: DG Chest 2 View Result Date: 10/16/2023 CLINICAL DATA:  Chest pain for 3 days.  History of sickle cell. EXAM: CHEST - 2 VIEW COMPARISON:  Radiographs 08/21/2023 and 05/31/2023.  CT 05/27/2023. FINDINGS: Mild patient rotation to the right. The heart size is stable at the upper limits of normal. There is mild atelectasis at both lung bases. No edema, confluent airspace disease, pleural effusion or pneumothorax. Mild thoracic scoliosis without acute osseous abnormality. IMPRESSION: Mild bibasilar atelectasis. No evidence of acute chest process. Electronically Signed   By: Elmon Hagedorn M.D.   On: 10/16/2023 08:36    Microbiology: Recent Results (from the past 240 hours)  Resp panel by RT-PCR (RSV, Flu A&B, Covid) Anterior Nasal Swab     Status: None   Collection Time: 10/16/23  9:33 AM   Specimen: Anterior Nasal Swab  Result Value Ref Range Status   SARS Coronavirus 2 by RT PCR NEGATIVE NEGATIVE Final    Comment: (NOTE) SARS-CoV-2 target nucleic acids are NOT DETECTED.  The SARS-CoV-2 RNA is generally detectable in upper respiratory specimens during the acute phase of infection. The lowest concentration of SARS-CoV-2 viral copies this assay can detect is 138 copies/mL. A negative result does not preclude SARS-Cov-2 infection and should not be used as the sole basis for treatment or other patient management decisions. A negative result may occur  with  improper specimen  collection/handling, submission of specimen other than nasopharyngeal swab, presence of viral mutation(s) within the areas targeted by this assay, and inadequate number of viral copies(<138 copies/mL). A negative result must be combined with clinical observations, patient history, and epidemiological information. The expected result is Negative.  Fact Sheet for Patients:  BloggerCourse.com  Fact Sheet for Healthcare Providers:  SeriousBroker.it  This test is no t yet approved or cleared by the [place]  FDA and  has been authorized for detection and/or diagnosis of SARS-CoV-2 by FDA under an Emergency Use Authorization (EUA). This EUA will remain  in effect (meaning this test can be used) for the duration of the COVID-19 declaration under Section 564(b)(1) of the Act, 21 U.S.C.section 360bbb-3(b)(1), unless the authorization is terminated  or revoked sooner.       Influenza A by PCR NEGATIVE NEGATIVE Final   Influenza B by PCR NEGATIVE NEGATIVE Final    Comment: (NOTE) The Xpert Xpress SARS-CoV-2/FLU/RSV plus assay is intended as an aid in the diagnosis of influenza from Nasopharyngeal swab specimens and should not be used as a sole basis for treatment. Nasal washings and aspirates are unacceptable for Xpert Xpress SARS-CoV-2/FLU/RSV testing.  Fact Sheet for Patients: BloggerCourse.com  Fact Sheet for Healthcare Providers: SeriousBroker.it  This test is not yet approved or cleared by the [place]  FDA and has been authorized for detection and/or diagnosis of SARS-CoV-2 by FDA under an Emergency Use Authorization (EUA). This EUA will remain in effect (meaning this test can be used) for the duration of the COVID-19 declaration under Section 564(b)(1) of the Act, 21 U.S.C. section 360bbb-3(b)(1), unless the authorization is terminated or revoked.     Resp Syncytial  Virus by PCR NEGATIVE NEGATIVE Final    Comment: (NOTE) Fact Sheet for Patients: BloggerCourse.com  Fact Sheet for Healthcare Providers: SeriousBroker.it  This test is not yet approved or cleared by the [place]  FDA and has been authorized for detection and/or diagnosis of SARS-CoV-2 by FDA under an Emergency Use Authorization (EUA). This EUA will remain in effect (meaning this test can be used) for the duration of the COVID-19 declaration under Section 564(b)(1) of the Act, 21 U.S.C. section 360bbb-3(b)(1), unless the authorization is terminated or revoked.  Performed at [facility], [address] [place], [place] [address]      Labs: Basic Metabolic Panel: Recent Labs  Lab 10/22/23 0654  NA 140  K 3.4*  CL 109  CO2 24  GLUCOSE 120*  BUN <5*  CREATININE 0.45  CALCIUM 8.7*   Liver Function Tests: No results for input(s): "AST", "ALT", "ALKPHOS", "BILITOT", "PROT", "ALBUMIN" in the last 168 hours. No results for input(s): "LIPASE", "AMYLASE" in the last 168 hours. No results for input(s): "AMMONIA" in the last 168 hours. CBC: Recent Labs  Lab 10/19/23 0537 10/20/23 0500 10/21/23 1158 10/22/23 0654 10/23/23 0539  WBC 21.7* 19.2* 17.2* 12.1* 12.9*  HGB 6.9* 7.1* 7.5* 6.9* 7.1*  HCT 19.7* 20.8* 22.7* 21.6* 21.2*  MCV 94.3 97.2 102.3* 103.8* 99.1  PLT 371 430* 431* 400 414*   Cardiac Enzymes: No results for input(s): "CKTOTAL", "CKMB", "CKMBINDEX", "TROPONINI" in the last 168 hours. BNP: Invalid input(s): "POCBNP" CBG: No results for input(s): "GLUCAP" in the last 168 hours.  Time coordinating discharge: 50 minutes  Signed:  Lorel Roes NP  [facility] 10/23/2023, 1:53 PM

## 2023-10-29 ENCOUNTER — Emergency Department

## 2023-10-29 ENCOUNTER — Other Ambulatory Visit: Payer: Self-pay

## 2023-10-29 ENCOUNTER — Emergency Department
Admission: EM | Admit: 2023-10-29 | Discharge: 2023-10-30 | Disposition: A | Discharge status: Other Acute Inpatient Hospital | Attending: Emergency Medicine | Admitting: Emergency Medicine

## 2023-10-29 DIAGNOSIS — R1031 Right lower quadrant pain: Secondary | ICD-10-CM | POA: Diagnosis not present

## 2023-10-29 DIAGNOSIS — D5700 Hb-SS disease with crisis, unspecified: Secondary | ICD-10-CM | POA: Insufficient documentation

## 2023-10-29 DIAGNOSIS — R112 Nausea with vomiting, unspecified: Secondary | ICD-10-CM | POA: Insufficient documentation

## 2023-10-29 DIAGNOSIS — R1084 Generalized abdominal pain: Secondary | ICD-10-CM | POA: Insufficient documentation

## 2023-10-29 LAB — COMPREHENSIVE METABOLIC PANEL WITH GFR
ALT: 42 U/L (ref 0–44)
AST: 36 U/L (ref 15–41)
Albumin: 3.9 g/dL (ref 3.5–5.0)
Alkaline Phosphatase: 108 U/L (ref 38–126)
Anion gap: 9 (ref 5–15)
BUN: 7 mg/dL (ref 6–20)
CO2: 20 mmol/L — ABNORMAL LOW (ref 22–32)
Calcium: 9.1 mg/dL (ref 8.9–10.3)
Chloride: 104 mmol/L (ref 98–111)
Creatinine, Ser: 0.39 mg/dL — ABNORMAL LOW (ref 0.44–1.00)
GFR, Estimated: >60 mL/min (ref >60)
Glucose, Bld: 92 mg/dL (ref 70–99)
Potassium: 3.5 mmol/L (ref 3.5–5.1)
Sodium: 133 mmol/L — ABNORMAL LOW (ref 135–145)
Total Bilirubin: 3.3 mg/dL — ABNORMAL HIGH (ref 0.0–1.2)
Total Protein: 7.9 g/dL (ref 6.5–8.1)

## 2023-10-29 LAB — RETICULOCYTES
Immature Retic Fract: 42.2 % — ABNORMAL HIGH (ref 2.3–15.9)
RBC.: 2.28 MIL/uL — ABNORMAL LOW (ref 3.87–5.11)
Retic Count, Absolute: 294.6 10*3/uL — ABNORMAL HIGH (ref 19.0–186.0)
Retic Ct Pct: 12.9 % — ABNORMAL HIGH (ref 0.4–3.1)

## 2023-10-29 LAB — URINALYSIS, ROUTINE W REFLEX MICROSCOPIC
Bilirubin Urine: NEGATIVE
Glucose, UA: NEGATIVE mg/dL
Hgb urine dipstick: NEGATIVE
Ketones, ur: NEGATIVE mg/dL
Leukocytes,Ua: NEGATIVE
Nitrite: NEGATIVE
Protein, ur: NEGATIVE mg/dL
Specific Gravity, Urine: 1.005 (ref 1.005–1.030)
pH: 6 (ref 5.0–8.0)

## 2023-10-29 LAB — CBC WITH DIFFERENTIAL/PLATELET
Abs Immature Granulocytes: 0.08 10*3/uL — ABNORMAL HIGH (ref 0.00–0.07)
Basophils Absolute: 0.1 10*3/uL (ref 0.0–0.1)
Basophils Relative: 1 %
Eosinophils Absolute: 0.6 10*3/uL — ABNORMAL HIGH (ref 0.0–0.5)
Eosinophils Relative: 3 %
HCT: 23.5 % — ABNORMAL LOW (ref 36.0–46.0)
Hemoglobin: 8.1 g/dL — ABNORMAL LOW (ref 12.0–15.0)
Immature Granulocytes: 1 %
Lymphocytes Relative: 18 %
Lymphs Abs: 3 10*3/uL (ref 0.7–4.0)
MCH: 32.7 pg (ref 26.0–34.0)
MCHC: 34.5 g/dL (ref 30.0–36.0)
MCV: 94.8 fL (ref 80.0–100.0)
Monocytes Absolute: 2 10*3/uL — ABNORMAL HIGH (ref 0.1–1.0)
Monocytes Relative: 12 %
Neutro Abs: 11.2 10*3/uL — ABNORMAL HIGH (ref 1.7–7.7)
Neutrophils Relative %: 65 %
Platelets: 466 10*3/uL — ABNORMAL HIGH (ref 150–400)
RBC: 2.48 MIL/uL — ABNORMAL LOW (ref 3.87–5.11)
RDW: 17.4 % — ABNORMAL HIGH (ref 11.5–15.5)
WBC: 16.9 10*3/uL — ABNORMAL HIGH (ref 4.0–10.5)
nRBC: 2.4 % — ABNORMAL HIGH (ref 0.0–0.2)

## 2023-10-29 LAB — PREGNANCY, URINE: Preg Test, Ur: NEGATIVE

## 2023-10-29 MED ORDER — ONDANSETRON HCL 4 MG/2ML IJ SOLN
4.0000 mg | Freq: Once | INTRAMUSCULAR | Status: AC
  Administered 2023-10-29: 4 mg via INTRAVENOUS
  Filled 2023-10-29: qty 2

## 2023-10-29 MED ORDER — IOHEXOL 300 MG/ML  SOLN
80.0000 mL | Freq: Once | INTRAMUSCULAR | Status: AC | PRN
  Administered 2023-10-29: 80 mL via INTRAVENOUS

## 2023-10-29 MED ORDER — LACTATED RINGERS IV BOLUS
1000.0000 mL | Freq: Once | INTRAVENOUS | Status: AC
  Administered 2023-10-29: 1000 mL via INTRAVENOUS

## 2023-10-29 MED ORDER — HYDROMORPHONE HCL 1 MG/ML IJ SOLN
2.0000 mg | Freq: Once | INTRAMUSCULAR | Status: AC
  Administered 2023-10-29: 2 mg via INTRAVENOUS
  Filled 2023-10-29: qty 2

## 2023-10-29 MED ORDER — HYDROMORPHONE HCL 1 MG/ML IJ SOLN
1.0000 mg | Freq: Once | INTRAMUSCULAR | Status: AC
  Administered 2023-10-29: 1 mg via SUBCUTANEOUS
  Filled 2023-10-29: qty 1

## 2023-10-29 NOTE — ED Provider Notes (Signed)
 [facility] Provider Note    Event Date/Time   First MD Initiated Contact with Patient 10/29/23 2031     (approximate)   History   Sickle Cell Pain Crisis   HPI  ANGELYN OSTERBERG is a 18 y.o. female who presents to the ED for evaluation of Sickle Cell Pain Crisis   I reviewed [facility] medical DC summary from 6/9, admitted for 1 week due to sickle cell pain crisis.  Patient presents to the ED alongside her grandmother for evaluation of fever, abdominal pain and an episode of emesis as well as sickle cell pain.  She reports poorly controlled sickle cell pain but in addition to this has had notable abdominal pain which is unusual for her sickle cell pain.  She reports a fever up to 101 F 2 days ago as well as an episode of emesis yesterday.  No urinary symptoms, new vaginal discharge, stool changes, cough or shortness of breath, no chest pain  Physical Exam   Triage Vital Signs: ED Triage Vitals  Encounter Vitals Group     BP 10/29/23 2026 124/67     Girls Systolic BP Percentile --      Girls Diastolic BP Percentile --      Boys Systolic BP Percentile --      Boys Diastolic BP Percentile --      Pulse Rate 10/29/23 2026 85     Resp 10/29/23 2026 16     Temp 10/29/23 2026 98.4 F (36.9 C)     Temp Source 10/29/23 2026 Oral     SpO2 10/29/23 2026 99 %     Weight 10/29/23 2027 145 lb (65.8 kg)     Height 10/29/23 2027 5' 4 (1.626 m)     Head Circumference --      Peak Flow --      Pain Score 10/29/23 2027 10     Pain Loc --      Pain Education --      Exclude from Growth Chart --     Most recent vital signs: Vitals:   10/29/23 2026  BP: 124/67  Pulse: 85  Resp: 16  Temp: 98.4 F (36.9 C)  SpO2: 99%    General: Awake, no distress.  CV:  Good peripheral perfusion.  Resp:  Normal effort.  Abd:  No distention.  Mild RLQ tenderness without guarding or peritoneal features MSK:  No deformity noted.  Neuro:  No focal deficits  appreciated. Other:     ED Results / Procedures / Treatments   Labs (all labs ordered are listed, but only abnormal results are displayed) Labs Reviewed  COMPREHENSIVE METABOLIC PANEL WITH GFR - Abnormal; Notable for the following components:      Result Value   Sodium 133 (*)    CO2 20 (*)    Creatinine, Ser 0.39 (*)    Total Bilirubin 3.3 (*)    All other components within normal limits  CBC WITH DIFFERENTIAL/PLATELET - Abnormal; Notable for the following components:   WBC 16.9 (*)    RBC 2.48 (*)    Hemoglobin 8.1 (*)    HCT 23.5 (*)    RDW 17.4 (*)    Platelets 466 (*)    nRBC 2.4 (*)    Neutro Abs 11.2 (*)    Monocytes Absolute 2.0 (*)    Eosinophils Absolute 0.6 (*)    Abs Immature Granulocytes 0.08 (*)    All other components within normal limits  RETICULOCYTES -  Abnormal; Notable for the following components:   Retic Ct Pct 12.9 (*)    RBC. 2.28 (*)    Retic Count, Absolute 294.6 (*)    Immature Retic Fract 42.2 (*)    All other components within normal limits  URINALYSIS, ROUTINE W REFLEX MICROSCOPIC - Abnormal; Notable for the following components:   Color, Urine YELLOW (*)    APPearance CLEAR (*)    All other components within normal limits  PREGNANCY, URINE  POC URINE PREG, ED    EKG   RADIOLOGY CXR interpreted by me without evidence of acute cardiopulmonary pathology.  Official radiology report(s): DG Chest Portable 1 View Result Date: 10/29/2023 CLINICAL DATA:  Chest pain, fever and sickle cell disease. EXAM: PORTABLE CHEST 1 VIEW COMPARISON:  AP and lateral chest 10/16/2023 FINDINGS: The heart is slightly enlarged. No vascular congestion is seen. The mediastinum is normally outlined. The lungs are clear. The sulci are sharp. Thoracic cage is intact with mild upper thoracic levoscoliosis. Compare: Stable chest with slight cardiomegaly. IMPRESSION: No evidence of acute chest disease. Slight cardiomegaly. Electronically Signed   By: Denman Fischer M.D.    On: 10/29/2023 22:28    PROCEDURES and INTERVENTIONS:  Procedures  Medications  HYDROmorphone  (DILAUDID ) injection 1 mg (1 mg Subcutaneous Given 10/29/23 2117)  lactated ringers bolus 1,000 mL (1,000 mLs Intravenous New Bag/Given 10/29/23 2226)  HYDROmorphone  (DILAUDID ) injection 2 mg (2 mg Intravenous Given 10/29/23 2226)  ondansetron  (ZOFRAN ) injection 4 mg (4 mg Intravenous Given 10/29/23 2226)  iohexol  (OMNIPAQUE ) 300 MG/ML solution 80 mL (80 mLs Intravenous Contrast Given 10/29/23 2314)     IMPRESSION / MDM / ASSESSMENT AND PLAN / ED COURSE  I reviewed the triage vital signs and the nursing notes.  Differential diagnosis includes, but is not limited to, sickle cell pain crisis, acute chest syndrome, acute appendicitis, UTI or pyelonephritis, sepsis  {Patient presents with symptoms of an acute illness or injury that is potentially life-threatening.  Patient presents with abdominal symptoms, fever and emesis superimposed on sickle cell pain.  Fevers at home but none here.  Reassuring vital signs here.  Leukocytosis is noted, elevated reticulocytes without evidence of aplastic crisis.  Renal function intact.  In addition to treating her pain we will add on CT abdomen/pelvis, CXR and reassess  CXR reassuring.  Awaiting CT abdomen/pelvis at the time of signout to oncoming physician.  UA without infectious features.  Clinical Course as of 10/29/23 2317  Sun Oct 29, 2023  2159 N/V, fever, abd pain on top of typical sickelcell pain. 101*F [DS]    Clinical Course User Index [DS] Arline Bennett, MD     FINAL CLINICAL IMPRESSION(S) / ED DIAGNOSES   Final diagnoses:  Sickle cell pain crisis (HCC)  Generalized abdominal pain  Nausea and vomiting, unspecified vomiting type     Rx / DC Orders   ED Discharge Orders     None        Note:  This document was prepared using Dragon voice recognition software and may include unintentional dictation errors.   Arline Bennett,  MD 10/29/23 [phone]

## 2023-10-29 NOTE — ED Triage Notes (Signed)
 Pt presents to ER from home with sickle pain for the past 2 days. Pt reporst she recently got discharged from hospital and her PCP changed who prescribed the medicine and was not able to fill her prescription. Pt talks in complete sentences mild distress noted.

## 2023-10-30 ENCOUNTER — Inpatient Hospital Stay (HOSPITAL_COMMUNITY)
Admission: AD | Admit: 2023-10-30 | Discharge: 2023-11-03 | DRG: 812 | Disposition: A | Discharge status: Home / Self Care | Source: Other Acute Inpatient Hospital | Attending: Internal Medicine | Admitting: Internal Medicine

## 2023-10-30 ENCOUNTER — Encounter (HOSPITAL_COMMUNITY): Payer: Self-pay

## 2023-10-30 DIAGNOSIS — F32A Depression, unspecified: Secondary | ICD-10-CM | POA: Diagnosis present

## 2023-10-30 DIAGNOSIS — Z79899 Other long term (current) drug therapy: Secondary | ICD-10-CM

## 2023-10-30 DIAGNOSIS — Z8673 Personal history of transient ischemic attack (TIA), and cerebral infarction without residual deficits: Secondary | ICD-10-CM | POA: Diagnosis not present

## 2023-10-30 DIAGNOSIS — D638 Anemia in other chronic diseases classified elsewhere: Secondary | ICD-10-CM | POA: Diagnosis present

## 2023-10-30 DIAGNOSIS — Z91010 Allergy to peanuts: Secondary | ICD-10-CM | POA: Diagnosis not present

## 2023-10-30 DIAGNOSIS — Z885 Allergy status to narcotic agent status: Secondary | ICD-10-CM | POA: Diagnosis not present

## 2023-10-30 DIAGNOSIS — Z886 Allergy status to analgesic agent status: Secondary | ICD-10-CM | POA: Diagnosis not present

## 2023-10-30 DIAGNOSIS — D5700 Hb-SS disease with crisis, unspecified: Secondary | ICD-10-CM | POA: Diagnosis present

## 2023-10-30 DIAGNOSIS — F4310 Post-traumatic stress disorder, unspecified: Secondary | ICD-10-CM | POA: Diagnosis present

## 2023-10-30 DIAGNOSIS — F411 Generalized anxiety disorder: Secondary | ICD-10-CM | POA: Diagnosis present

## 2023-10-30 DIAGNOSIS — Z743 Need for continuous supervision: Secondary | ICD-10-CM | POA: Diagnosis not present

## 2023-10-30 DIAGNOSIS — J4520 Mild intermittent asthma, uncomplicated: Secondary | ICD-10-CM | POA: Diagnosis present

## 2023-10-30 DIAGNOSIS — Z833 Family history of diabetes mellitus: Secondary | ICD-10-CM

## 2023-10-30 DIAGNOSIS — G894 Chronic pain syndrome: Secondary | ICD-10-CM | POA: Diagnosis present

## 2023-10-30 DIAGNOSIS — D72829 Elevated white blood cell count, unspecified: Secondary | ICD-10-CM | POA: Diagnosis present

## 2023-10-30 DIAGNOSIS — Z91018 Allergy to other foods: Secondary | ICD-10-CM | POA: Diagnosis not present

## 2023-10-30 DIAGNOSIS — Z888 Allergy status to other drugs, medicaments and biological substances status: Secondary | ICD-10-CM

## 2023-10-30 DIAGNOSIS — Z7951 Long term (current) use of inhaled steroids: Secondary | ICD-10-CM | POA: Diagnosis not present

## 2023-10-30 DIAGNOSIS — I959 Hypotension, unspecified: Secondary | ICD-10-CM | POA: Diagnosis not present

## 2023-10-30 DIAGNOSIS — R1084 Generalized abdominal pain: Secondary | ICD-10-CM | POA: Diagnosis not present

## 2023-10-30 LAB — LACTATE DEHYDROGENASE: LDH: 197 U/L — ABNORMAL HIGH (ref 98–192)

## 2023-10-30 LAB — CBC
HCT: 23.9 % — ABNORMAL LOW (ref 36.0–46.0)
Hemoglobin: 8.1 g/dL — ABNORMAL LOW (ref 12.0–15.0)
MCH: 33.2 pg (ref 26.0–34.0)
MCHC: 33.9 g/dL (ref 30.0–36.0)
MCV: 98 fL (ref 80.0–100.0)
Platelets: 511 10*3/uL — ABNORMAL HIGH (ref 150–400)
RBC: 2.44 MIL/uL — ABNORMAL LOW (ref 3.87–5.11)
RDW: 18 % — ABNORMAL HIGH (ref 11.5–15.5)
WBC: 14.4 10*3/uL — ABNORMAL HIGH (ref 4.0–10.5)
nRBC: 1.2 % — ABNORMAL HIGH (ref 0.0–0.2)

## 2023-10-30 LAB — CREATININE, SERUM
Creatinine, Ser: 0.47 mg/dL (ref 0.44–1.00)
GFR, Estimated: >60 mL/min (ref >60)

## 2023-10-30 MED ORDER — SODIUM CHLORIDE 0.9 % IV BOLUS: 1000.0000 mL | Freq: Once | INTRAVENOUS | Status: DC

## 2023-10-30 MED ORDER — FLUTICASONE PROPIONATE 50 MCG/ACT NA SUSP
1.0000 | Freq: Every day | NASAL | Status: DC | PRN
Start: 2023-10-30 — End: 2023-11-03

## 2023-10-30 MED ORDER — ENOXAPARIN SODIUM 40 MG/0.4ML IJ SOSY
40.0000 mg | PREFILLED_SYRINGE | INTRAMUSCULAR | Status: DC
  Administered 2023-11-02 – 2023-11-03 (×2): 40 mg via SUBCUTANEOUS
  Filled 2023-10-30 (×4): qty 0.4

## 2023-10-30 MED ORDER — FLUTICASONE FUROATE-VILANTEROL 100-25 MCG/ACT IN AEPB
1.0000 | INHALATION_SPRAY | Freq: Every day | RESPIRATORY_TRACT | Status: DC
  Administered 2023-10-30 – 2023-11-03 (×5): 1 via RESPIRATORY_TRACT
  Filled 2023-10-30: qty 28

## 2023-10-30 MED ORDER — HYDROXYUREA 500 MG PO CAPS
500.0000 mg | ORAL_CAPSULE | Freq: Two times a day (BID) | ORAL | Status: DC
  Administered 2023-10-30 – 2023-11-03 (×9): 500 mg via ORAL
  Filled 2023-10-30 (×9): qty 1

## 2023-10-30 MED ORDER — SENNOSIDES-DOCUSATE SODIUM 8.6-50 MG PO TABS
1.0000 | ORAL_TABLET | Freq: Two times a day (BID) | ORAL | Status: DC
  Administered 2023-10-30 – 2023-11-03 (×9): 1 via ORAL
  Filled 2023-10-30 (×9): qty 1

## 2023-10-30 MED ORDER — NALOXONE HCL 0.4 MG/ML IJ SOLN: 0.4000 mg | INTRAMUSCULAR | Status: DC | PRN

## 2023-10-30 MED ORDER — DIPHENHYDRAMINE HCL 25 MG PO CAPS: 25.0000 mg | ORAL_CAPSULE | ORAL | Status: DC | PRN

## 2023-10-30 MED ORDER — LORATADINE 10 MG PO TABS
10.0000 mg | ORAL_TABLET | Freq: Every day | ORAL | Status: DC
  Administered 2023-10-30 – 2023-11-03 (×5): 10 mg via ORAL
  Filled 2023-10-30 (×5): qty 1

## 2023-10-30 MED ORDER — PREGABALIN 75 MG PO CAPS
150.0000 mg | ORAL_CAPSULE | Freq: Three times a day (TID) | ORAL | Status: DC
Start: 2023-10-30 — End: 2023-11-03
  Administered 2023-10-30 – 2023-11-03 (×14): 150 mg via ORAL
  Filled 2023-10-30 (×14): qty 2

## 2023-10-30 MED ORDER — PRAZOSIN HCL 1 MG PO CAPS
1.0000 mg | ORAL_CAPSULE | Freq: Every day | ORAL | Status: DC
Start: 2023-10-30 — End: 2023-11-03
  Administered 2023-10-30 – 2023-11-02 (×4): 1 mg via ORAL
  Filled 2023-10-30 (×4): qty 1

## 2023-10-30 MED ORDER — HYDROMORPHONE 1 MG/ML IV SOLN
INTRAVENOUS | Status: DC
  Administered 2023-10-30: 30 mg via INTRAVENOUS
  Administered 2023-10-30 (×2): 9.5 mg via INTRAVENOUS
  Administered 2023-10-31: 3 mg via INTRAVENOUS
  Administered 2023-10-31: 30 mg via INTRAVENOUS
  Administered 2023-10-31: 4 mg via INTRAVENOUS
  Administered 2023-10-31: 0.1 mg via INTRAVENOUS
  Administered 2023-10-31: 14.5 mg via INTRAVENOUS
  Administered 2023-10-31: 30 mg via INTRAVENOUS
  Administered 2023-10-31 (×2): 4 mg via INTRAVENOUS
  Administered 2023-11-01: 8.5 mg via INTRAVENOUS
  Administered 2023-11-01: 9.5 mg via INTRAVENOUS
  Administered 2023-11-01: 2 mg via INTRAVENOUS
  Administered 2023-11-01: 8 mg via INTRAVENOUS
  Administered 2023-11-01: 10.5 mg via INTRAVENOUS
  Administered 2023-11-01: 0.1 mg via INTRAVENOUS
  Administered 2023-11-01: 9 mg via INTRAVENOUS
  Administered 2023-11-02: 8.5 mL via INTRAVENOUS
  Administered 2023-11-02: 9.5 mL via INTRAVENOUS
  Administered 2023-11-02: 8 mg via INTRAVENOUS
  Administered 2023-11-02: 30 mg via INTRAVENOUS
  Administered 2023-11-02: 7 mg via INTRAVENOUS
  Administered 2023-11-03: 7.63 mg via INTRAVENOUS
  Administered 2023-11-03: 2.5 mg via INTRAVENOUS
  Administered 2023-11-03: 0.5 mL via INTRAVENOUS
  Filled 2023-10-30 (×7): qty 30

## 2023-10-30 MED ORDER — OXYCODONE HCL 5 MG PO TABS
10.0000 mg | ORAL_TABLET | Freq: Four times a day (QID) | ORAL | Status: DC | PRN
  Administered 2023-10-31 – 2023-11-03 (×6): 10 mg via ORAL
  Filled 2023-10-30 (×6): qty 2

## 2023-10-30 MED ORDER — DROPERIDOL 2.5 MG/ML IJ SOLN
2.5000 mg | Freq: Once | INTRAMUSCULAR | Status: AC
  Administered 2023-10-30: 2.5 mg via INTRAVENOUS
  Filled 2023-10-30: qty 2

## 2023-10-30 MED ORDER — POLYETHYLENE GLYCOL 3350 17 G PO PACK: 17.0000 g | PACK | Freq: Every day | ORAL | Status: DC | PRN

## 2023-10-30 MED ORDER — BUPRENORPHINE 20 MCG/HR TD PTWK
1.0000 | MEDICATED_PATCH | TRANSDERMAL | Status: DC
  Administered 2023-10-31: 1 via TRANSDERMAL

## 2023-10-30 MED ORDER — ONDANSETRON HCL 4 MG/2ML IJ SOLN: 4.0000 mg | Freq: Four times a day (QID) | INTRAMUSCULAR | Status: DC | PRN

## 2023-10-30 MED ORDER — HYDROMORPHONE 1 MG/ML IV SOLN: INTRAVENOUS | Status: DC

## 2023-10-30 MED ORDER — DIPHENHYDRAMINE HCL 50 MG/ML IJ SOLN
25.0000 mg | Freq: Once | INTRAMUSCULAR | Status: AC
  Administered 2023-10-30: 25 mg via INTRAVENOUS
  Filled 2023-10-30: qty 1

## 2023-10-30 MED ORDER — HYDROMORPHONE HCL 1 MG/ML IJ SOLN
2.0000 mg | INTRAMUSCULAR | Status: DC | PRN
  Administered 2023-10-30 (×3): 2 mg via INTRAVENOUS
  Filled 2023-10-30 (×3): qty 2

## 2023-10-30 MED ORDER — SODIUM CHLORIDE 0.9% FLUSH: 9.0000 mL | INTRAVENOUS | Status: DC | PRN

## 2023-10-30 MED ORDER — SODIUM CHLORIDE 0.9% FLUSH: 10.0000 mL | INTRAVENOUS | Status: DC | PRN

## 2023-10-30 MED ORDER — PANTOPRAZOLE SODIUM 40 MG PO TBEC
40.0000 mg | DELAYED_RELEASE_TABLET | Freq: Every day | ORAL | Status: DC
  Administered 2023-10-30 – 2023-11-03 (×5): 40 mg via ORAL
  Filled 2023-10-30 (×5): qty 1

## 2023-10-30 MED ORDER — SODIUM CHLORIDE 0.45 % IV SOLN: INTRAVENOUS | Status: AC

## 2023-10-30 MED ORDER — DEFERASIROX 360 MG PO TABS
360.0000 mg | ORAL_TABLET | Freq: Two times a day (BID) | ORAL | Status: DC
  Administered 2023-10-30 – 2023-11-03 (×8): 360 mg via ORAL
  Filled 2023-10-30 (×8): qty 1

## 2023-10-30 NOTE — ED Notes (Signed)
 EMTALA reviewed by this RN.

## 2023-10-30 NOTE — Progress Notes (Signed)

## 2023-10-30 NOTE — ED Notes (Signed)
 Pt in room with grandmother c/o muscle tightness and tension. Pt having left sided gaze sitting upright in bed and diaphoretic. Pt given medication and has begun to move her arms and face. Vitals obtained, pt stable.

## 2023-10-30 NOTE — Plan of Care (Signed)
  Problem: Education: Goal: Knowledge of General Education information will improve Description: Including pain rating scale, medication(s)/side effects and non-pharmacologic comfort measures Outcome: Progressing   Problem: Health Behavior/Discharge Planning: Goal: Ability to manage health-related needs will improve Outcome: Progressing   Problem: Clinical Measurements: Goal: Ability to maintain clinical measurements within normal limits will improve Outcome: Progressing Goal: Will remain free from infection Outcome: Progressing Goal: Diagnostic test results will improve Outcome: Progressing Goal: Respiratory complications will improve Outcome: Progressing Goal: Cardiovascular complication will be avoided Outcome: Progressing   Problem: Activity: Goal: Risk for activity intolerance will decrease Outcome: Progressing   Problem: Nutrition: Goal: Adequate nutrition will be maintained Outcome: Progressing   Problem: Coping: Goal: Level of anxiety will decrease Outcome: Progressing   Problem: Elimination: Goal: Will not experience complications related to bowel motility Outcome: Progressing Goal: Will not experience complications related to urinary retention Outcome: Progressing   Problem: Safety: Goal: Ability to remain free from injury will improve Outcome: Progressing   Problem: Skin Integrity: Goal: Risk for impaired skin integrity will decrease Outcome: Progressing   Problem: Education: Goal: Knowledge of vaso-occlusive preventative measures will improve Outcome: Progressing Goal: Awareness of infection prevention will improve Outcome: Progressing Goal: Awareness of signs and symptoms of anemia will improve Outcome: Progressing Goal: Long-term complications will improve Outcome: Progressing   Problem: Self-Care: Goal: Ability to incorporate actions that prevent/reduce pain crisis will improve Outcome: Progressing   Problem: Tissue Perfusion: Goal:  Complications related to inadequate tissue perfusion will be avoided or minimized Outcome: Progressing   Problem: Respiratory: Goal: Pulmonary complications will be avoided or minimized Outcome: Progressing Goal: Acute Chest Syndrome will be identified early to prevent complications Outcome: Progressing   Problem: Sensory: Goal: Pain level will decrease with appropriate interventions Outcome: Progressing

## 2023-10-30 NOTE — ED Notes (Signed)
 [facility] /accepting physician is Dr. Melford Spotted 6E  ROOM [phone] spoke to Sallie /[facility]  will transport after shift change

## 2023-10-30 NOTE — H&P (Signed)
 [facility] History and Physical  SACRED ROA RUE:[id number] DOB: 23-Feb-2006 DOA: 10/30/2023  PCP: Gladys Lamp, MD   Chief Complaint: No chief complaint on file.   HPI: Bailey Reese is a 18 y.o. female with history of sickle cell disease, PTSD, depression with anxiety who usually gets her care at [facility] but was seen at [facility] for abdominal pain , fever and emesis not typical of her sickle cell pain.   Patient was managed in the ER for crisis however her pain was not controlled. Patient was transferred to our care for continued sickle cell crisis pain control.  She has vitals that are stable.  Hemoglobin is around 9.6 g/dl.  She was getting IV Dilaudid  pushes in the ER.  Patient denied any nausea vomiting or diarrhea.  Denied any viral illness.  She has had previous osteomyelitis and multiple episodes of acute chest syndromes.  Also hemorrhagic stroke in 2017.  Patient has chronic pain from her sickle cell disease.  No recent travels or sick contacts.   Systemic Review: General: The patient denies anorexia, fever, weight loss Cardiac: Denies chest pain, syncope, palpitations, pedal edema  Respiratory: Denies cough, shortness of breath, wheezing GI: Denies severe indigestion/heartburn, abdominal pain, nausea, vomiting, diarrhea and constipation GU: Denies hematuria, incontinence, dysuria  Musculoskeletal: Denies arthritis  Skin: Denies suspicious skin lesions Neurologic: Denies focal weakness or numbness, change in vision  Past Medical History:  Diagnosis Date   Anxiety    Depression    PTSD (post-traumatic stress disorder)    Sickle cell anemia (HCC)     Past Surgical History:  Procedure Laterality Date   BIOPSY  07/04/2023   Procedure: BIOPSY;  Surgeon: Genell Ken, MD;  Location: [facility];  Service: Gastroenterology;;   CHOLECYSTECTOMY     ESOPHAGOGASTRODUODENOSCOPY (EGD) WITH PROPOFOL  N/A 07/04/2023   Procedure:  ESOPHAGOGASTRODUODENOSCOPY (EGD) WITH PROPOFOL ;  Surgeon: Genell Ken, MD;  Location: [facility];  Service: Gastroenterology;  Laterality: N/A;   WRIST SURGERY      Allergies  Allergen Reactions   Cherry Anaphylaxis, Itching and Swelling   Olanzapine  Other (See Comments)    Drug-induced liver injury   Other Swelling and Other (See Comments)    Pitted fruits   Peanut-Containing Drug Products Anaphylaxis, Itching and Swelling   Plum Pulp Anaphylaxis and Hives   Morphine  And Codeine Hives and Itching   Peach [Prunus Persica] Itching, Swelling and Other (See Comments)   Acetaminophen  Nausea And Vomiting   Ibuprofen  Nausea And Vomiting    Family History  Problem Relation Age of Onset   Diabetes Paternal Aunt       Prior to Admission medications   Medication Sig Start Date End Date Taking? Authorizing Provider  budesonide-formoterol  (SYMBICORT) 80-4.5 MCG/ACT inhaler Inhale 2 puffs into the lungs See admin instructions. Inhale 2 puffs by mouth twice daily and an extra puff as needed. 12/29/22 12/29/23 Yes [provider]  buprenorphine  (BUTRANS ) 20 MCG/HR PTWK Place 1 patch onto the skin once a week.   Yes [provider]  Calcium Carb-Cholecalciferol (CALCIUM 600+D3 PO) Take 1 tablet by mouth daily.   Yes [provider]  Deferasirox  360 MG TABS Take 360 mg by mouth 2 (two) times daily.   Yes [provider]  ergocalciferol (VITAMIN D2) 1.25 MG (50000 UT) capsule Take 50,000 Units by mouth once a week.   Yes [provider]  esomeprazole  (NEXIUM ) 40 MG capsule Take 1 capsule (40 mg total) by mouth  daily as needed (reflux). 08/29/23  Yes Basilia Stuckert M, NP  fexofenadine (ALLEGRA) 180 MG tablet Take 180 mg by mouth daily as needed for allergies. 12/29/22  Yes [provider]  fluticasone  (FLONASE ) 50 MCG/ACT nasal spray Place 1 spray into both nostrils daily as needed for allergies or rhinitis. 12/29/22  Yes [provider]   hydroxyurea  (HYDREA ) 500 MG capsule Take 500 mg by mouth 2 (two) times daily. May take with food to minimize GI side effects.   Yes [provider]  hydrOXYzine  (ATARAX ) 25 MG tablet Take 25 mg by mouth See admin instructions. Take 25mg  (1 tablet) by mouth twice daily and an extra tablet if needed.   Yes [provider]  lubiprostone  (AMITIZA ) 8 MCG capsule Take 8 mcg by mouth 2 (two) times daily as needed for constipation. 06/01/23 05/31/24 Yes [provider]  Melaton-Thean-Cham-PassF-LBalm (SLEEP) CAPS Take 1 capsule by mouth at bedtime.   Yes [provider]  ondansetron  (ZOFRAN -ODT) 4 MG disintegrating tablet Take 4 mg by mouth every 8 (eight) hours as needed for nausea or vomiting (dissolve orally).   Yes [provider]  Oxycodone  HCl 10 MG TABS Take 1 tablet (10 mg total) by mouth every 6 (six) hours as needed (pain). 07/05/23  Yes Davida Espy, MD  pantoprazole  (PROTONIX ) 40 MG tablet Take 1 tablet (40 mg total) by mouth 2 (two) times daily. 07/05/23 07/04/24 Yes Davida Espy, MD  prazosin  (MINIPRESS ) 1 MG capsule Take 1 mg by mouth at bedtime.   Yes [provider]  pregabalin  (LYRICA ) 150 MG capsule Take 150 mg by mouth 3 (three) times daily.   Yes [provider]  prochlorperazine (COMPAZINE) 5 MG tablet Take 5 mg by mouth every 6 (six) hours as needed for nausea or vomiting.   Yes [provider]  promethazine (PHENERGAN) 12.5 MG tablet Take 12.5 mg by mouth every 6 (six) hours as needed for nausea or vomiting. 05/10/23  Yes [provider]     Physical Exam: Vitals:   10/30/23 1326 10/30/23 1418 10/30/23 1419 10/30/23 1546  BP: 95/60     Pulse: 85     Resp: 20   15  Temp: 98.1 F (36.7 C)     TempSrc: Oral     SpO2: 100% 96% 96% 96%    General: Alert, awake, afebrile, anicteric, not in obvious distress HEENT: Normocephalic and Atraumatic, Mucous membranes pink                PERRLA; EOM  intact; No scleral icterus,                 Nares: Patent, Oropharynx: Clear, Fair Dentition                 Neck: FROM, no cervical lymphadenopathy, thyromegaly, carotid bruit or JVD;  CHEST WALL: No tenderness  CHEST: Normal respiration, clear to auscultation bilaterally  HEART: Regular rate and rhythm; no murmurs rubs or gallops  BACK: No kyphosis or scoliosis; no CVA tenderness  ABDOMEN: Positive Bowel Sounds, soft, non-tender; no masses, no organomegaly EXTREMITIES: No cyanosis, clubbing, or edema SKIN:  no rash or ulceration  CNS: Alert and Oriented x 4, Nonfocal exam, CN 2-12 intact  Labs on Admission:  Basic Metabolic Panel: Recent Labs  Lab 10/29/23 2125 10/30/23 1202  NA 133*  --   K 3.5  --   CL 104  --   CO2 20*  --   GLUCOSE 92  --  BUN 7  --   CREATININE 0.39* 0.47  CALCIUM 9.1  --    Liver Function Tests: Recent Labs  Lab 10/29/23 2125  AST 36  ALT 42  ALKPHOS 108  BILITOT 3.3*  PROT 7.9  ALBUMIN 3.9   No results for input(s): LIPASE, AMYLASE in the last 168 hours. No results for input(s): AMMONIA in the last 168 hours. CBC: Recent Labs  Lab 10/29/23 2125 10/30/23 1202  WBC 16.9* 14.4*  NEUTROABS 11.2*  --   HGB 8.1* 8.1*  HCT 23.5* 23.9*  MCV 94.8 98.0  PLT 466* 511*   Cardiac Enzymes: No results for input(s): CKTOTAL, CKMB, CKMBINDEX, TROPONINI in the last 168 hours.  BNP (last 3 results) No results for input(s): BNP in the last 8760 hours.  ProBNP (last 3 results) No results for input(s): PROBNP in the last 8760 hours.  CBG: No results for input(s): GLUCAP in the last 168 hours.   Assessment/Plan Active Problems:   Chronic pain syndrome   Leucocytosis   GAD (generalized anxiety disorder)   Sickle cell anemia with pain (HCC)  [facility] IVF D5 .45% Saline @ 125 mls/hour Weight based Dilaudid  PCA started within 30 minutes of admission IV Toradol  15 mg Q 6 H x 2  doses Acetaminophen  1000 mg x 1 dose Labs: CBCD, CMP, Retic Count and LDH Monitor vitals very closely, Re-evaluate pain scale every hour 2 L of Oxygen by [place] Patient will be re-evaluated for pain in the context of function and relationship to baseline as care progresses. If no significant relieve from pain (remains above 5/10) will transfer patient to inpatient services for further evaluation and management  Code Status: Full  Family Communication: None  DVT Prophylaxis: Ambulate as tolerated   Time spent: 54 Minutes  Kierre Hintz Ginette Lah  If 7PM-7AM, please contact night-coverage [email or web address] 10/30/2023, 5:08 PM

## 2023-10-31 LAB — CBC
HCT: 21.8 % — ABNORMAL LOW (ref 36.0–46.0)
Hemoglobin: 7.3 g/dL — ABNORMAL LOW (ref 12.0–15.0)
MCH: 32.9 pg (ref 26.0–34.0)
MCHC: 33.5 g/dL (ref 30.0–36.0)
MCV: 98.2 fL (ref 80.0–100.0)
Platelets: 433 10*3/uL — ABNORMAL HIGH (ref 150–400)
RBC: 2.22 MIL/uL — ABNORMAL LOW (ref 3.87–5.11)
RDW: 16.9 % — ABNORMAL HIGH (ref 11.5–15.5)
WBC: 11.2 10*3/uL — ABNORMAL HIGH (ref 4.0–10.5)
nRBC: 0.8 % — ABNORMAL HIGH (ref 0.0–0.2)

## 2023-10-31 MED ORDER — PROCHLORPERAZINE EDISYLATE 10 MG/2ML IJ SOLN: 5.0000 mg | Freq: Four times a day (QID) | INTRAMUSCULAR | Status: DC | PRN

## 2023-10-31 MED ORDER — ONDANSETRON HCL 4 MG/2ML IJ SOLN
4.0000 mg | Freq: Four times a day (QID) | INTRAMUSCULAR | Status: AC | PRN
  Administered 2023-10-31 – 2023-11-01 (×2): 4 mg via INTRAVENOUS
  Filled 2023-10-31 (×2): qty 2

## 2023-10-31 NOTE — Plan of Care (Signed)

## 2023-10-31 NOTE — Progress Notes (Signed)
 Patient ID: ALEXANDRIA SHIFLETT, female   DOB: 05/13/2006, 18 y.o.   MRN: [id number] Subjective: Sally-Anne Wamble  is a 18 y.o. female with medical history significant of sickle cell disease, PTSD, depression with anxiety who usually gets her care at [facility] but was seen at [facility] ER and admitted to [facility] for sickle cell pain crisis.    Patient continues to report pain of 8/10, she is gradually improving however she states she has slight nausea without vomiting. Denies fever, cough, headache, SOB, no urinary symptoms.   Objective:  Vital signs in last 24 hours:  Vitals:   10/31/23 0551 10/31/23 0952 10/31/23 0956 10/31/23 1054  BP: 120/69   121/66  Pulse: 81   89  Resp: 18 18 18 17   Temp: 98.6 F (37 C)   98.7 F (37.1 C)  TempSrc: Oral   Oral  SpO2: 100%   100%    Intake/Output from previous day:   Intake/Output Summary (Last 24 hours) at 10/31/2023 1148 Last data filed at 10/31/2023 0700 Gross per 24 hour  Intake 520.1 ml  Output 1200 ml  Net -679.9 ml    Physical Exam: General: Alert, awake, oriented x3, in no acute distress.  HEENT: [place]/AT PEERL, EOMI Neck: Trachea midline,  no masses, no thyromegal,y no JVD, no carotid bruit OROPHARYNX:  Moist, No exudate/ erythema/lesions.  Heart: Regular rate and rhythm, without murmurs, rubs, gallops, PMI non-displaced, no heaves or thrills on palpation.  Lungs: Clear to auscultation, no wheezing or rhonchi noted. No increased vocal fremitus resonant to percussion  Abdomen: Soft, nontender, nondistended, positive bowel sounds, no masses no hepatosplenomegaly noted..  Neuro: No focal neurological deficits noted cranial nerves II through XII grossly intact. DTRs 2+ bilaterally upper and lower extremities. Strength 5 out of 5 in bilateral upper and lower extremities. Musculoskeletal: Lower back tenderness Psychiatric: Patient alert and oriented x3, good insight and cognition, good recent to remote recall. Lymph node  survey: No cervical axillary or inguinal lymphadenopathy noted.  Lab Results:  Basic Metabolic Panel:    Component Value Date/Time   NA 133 (L) 10/29/2023 2125   NA 135 05/01/2013 1440   K 3.5 10/29/2023 2125   K 4.3 05/01/2013 1440   CL 104 10/29/2023 2125   CL 102 05/01/2013 1440   CO2 20 (L) 10/29/2023 2125   CO2 27 (H) 05/01/2013 1440   BUN 7 10/29/2023 2125   BUN 13 05/01/2013 1440   CREATININE 0.47 10/30/2023 1202   CREATININE 0.40 (L) 05/01/2013 1440   GLUCOSE 92 10/29/2023 2125   GLUCOSE 108 (H) 05/01/2013 1440   CALCIUM 9.1 10/29/2023 2125   CALCIUM 9.8 05/01/2013 1440   CBC:    Component Value Date/Time   WBC 11.2 (H) 10/31/2023 0534   HGB 7.3 (L) 10/31/2023 0534   HGB 6.6 (L) 05/01/2013 1440   HCT 21.8 (L) 10/31/2023 0534   HCT 20.1 (L) 05/01/2013 1440   PLT 433 (H) 10/31/2023 0534   PLT 468 (H) 05/01/2013 1440   MCV 98.2 10/31/2023 0534   MCV 89 05/01/2013 1440   NEUTROABS 11.2 (H) 10/29/2023 2125   NEUTROABS 7.9 05/30/2012 1958   LYMPHSABS 3.0 10/29/2023 2125   LYMPHSABS 5.0 05/30/2012 1958   MONOABS 2.0 (H) 10/29/2023 2125   MONOABS 1.6 (H) 05/30/2012 1958   EOSABS 0.6 (H) 10/29/2023 2125   EOSABS 0.6 05/30/2012 1958   BASOSABS 0.1 10/29/2023 2125   BASOSABS 3 04/28/2013 1736   BASOSABS 0.2 (H) 05/30/2012 1958  No results found for this or any previous visit (from the past 240 hours).  Studies/Results: CT ABDOMEN PELVIS W CONTRAST Result Date: 10/29/2023 EXAM: CT ABDOMEN AND PELVIS WITH CONTRAST 10/29/2023 11:19:47 PM TECHNIQUE: CT of the abdomen and pelvis was performed with the administration of intravenous contrast. Multiplanar reformatted images are provided for review. Automated exposure control, iterative reconstruction, and/or weight based adjustment of the mA/kV was utilized to reduce the radiation dose to as low as reasonably achievable. COMPARISON: None available. CLINICAL HISTORY: RLQ, LUQ pain. fever, emesis. sickle cell pain crisis.  eval appy. Pt presents to ER from home with sickle pain for the past 2 days. Pt reports she recently got discharged from hospital and her PCP changed who prescribed the medicine and was not able to fill her prescription. Pt talks in complete sentences mild distress noted. FINDINGS: LOWER CHEST: No acute abnormality. LIVER: The liver is unremarkable. GALLBLADDER AND BILE DUCTS: Status post cholecystectomy. Mildly thick gallbladder. SPLEEN: No acute abnormality. PANCREAS: No acute abnormality. ADRENAL GLANDS: No acute abnormality. KIDNEYS, URETERS AND BLADDER: No stones in the kidneys or ureters. No hydronephrosis. No perinephric or periureteral stranding. Urinary bladder is unremarkable. GI AND BOWEL: Stomach demonstrates no acute abnormality. There is no bowel obstruction. No bowel wall thickening. Normal appendix (coronal image 34). Mild-to-moderate colonic stool burden. PERITONEUM AND RETROPERITONEUM: No ascites. No free air. VASCULATURE: Aorta is normal in caliber. LYMPH NODES: No lymphadenopathy. REPRODUCTIVE ORGANS: Retroverted uterus. BONES AND SOFT TISSUES: No acute osseous abnormality. No focal soft tissue abnormality. IMPRESSION: 1. Normal appendix. 2. No acute findings. 3. Status post cholecystectomy with mildly thick gallbladder. 4. Retroverted uterus. 5. Mild-to-moderate colonic stool burden. Electronically signed by: Zadie Herter MD 10/29/2023 11:25 PM EDT RP Workstation: [id number]   DG Chest Portable 1 View Result Date: 10/29/2023 CLINICAL DATA:  Chest pain, fever and sickle cell disease. EXAM: PORTABLE CHEST 1 VIEW COMPARISON:  AP and lateral chest 10/16/2023 FINDINGS: The heart is slightly enlarged. No vascular congestion is seen. The mediastinum is normally outlined. The lungs are clear. The sulci are sharp. Thoracic cage is intact with mild upper thoracic levoscoliosis. Compare: Stable chest with slight cardiomegaly. IMPRESSION: No evidence of acute chest disease. Slight cardiomegaly.  Electronically Signed   By: Denman Fischer M.D.   On: 10/29/2023 22:28    Medications: Scheduled Meds:  buprenorphine   1 patch Transdermal Weekly   Deferasirox   360 mg Oral BID   enoxaparin  (LOVENOX ) injection  40 mg Subcutaneous Q24H   fluticasone  furoate-vilanterol  1 puff Inhalation Daily   HYDROmorphone    Intravenous Q4H   hydroxyurea   500 mg Oral BID   loratadine  10 mg Oral Daily   pantoprazole   40 mg Oral Daily   prazosin   1 mg Oral QHS   pregabalin   150 mg Oral TID   senna-docusate  1 tablet Oral BID   Continuous Infusions: PRN Meds:.diphenhydrAMINE , fluticasone , oxyCODONE , polyethylene glycol, sodium chloride  flush  Consultants: None   Procedures: None  Antibiotics: None  Assessment/Plan: Active Problems:   Chronic pain syndrome   Leucocytosis   GAD (generalized anxiety disorder)   Mild intermittent asthma without complication   Sickle cell anemia with pain (HCC)   Hb Sickle Cell Disease with Pain crisis: Continue IVF 0.45% Saline @ KVO continue weight based Dilaudid  PCA, continue oral home pain medications as ordered. Monitor vitals very closely, Re-evaluate pain scale regularly, 2 L of Oxygen by [place]. Patient encouraged to ambulate on the hallway today.  Leukocytosis: Improving, Most likely elevated due  to vaso-occlusive crisis.  Will continue to monitor closely without antibiotics at this time.  Anemia of Chronic Disease: Hemoglobin is at baseline of 7.3 g/dl. Will continue to monitor closely and transfuse as appropriate  Chronic pain Syndrome: continue oral home pain medication as ordered.  Mild intermittent asthma without complication: Stable, no acute excerebration. Continue Symbicort as neede  GAD: Stable, continue medication as prescribed  Code Status: Full Code Family Communication: N/A Disposition Plan: Not yet ready for discharge  Lorel Roes NP   If 7PM-7AM, please contact night-coverage.  10/31/2023, 11:48 AM  LOS: 1 day

## 2023-11-01 ENCOUNTER — Encounter (HOSPITAL_COMMUNITY): Payer: Self-pay | Admitting: Internal Medicine

## 2023-11-01 LAB — CBC
HCT: 21 % — ABNORMAL LOW (ref 36.0–46.0)
Hemoglobin: 7.2 g/dL — ABNORMAL LOW (ref 12.0–15.0)
MCH: 33 pg (ref 26.0–34.0)
MCHC: 34.3 g/dL (ref 30.0–36.0)
MCV: 96.3 fL (ref 80.0–100.0)
Platelets: 442 10*3/uL — ABNORMAL HIGH (ref 150–400)
RBC: 2.18 MIL/uL — ABNORMAL LOW (ref 3.87–5.11)
RDW: 16.7 % — ABNORMAL HIGH (ref 11.5–15.5)
WBC: 13.8 10*3/uL — ABNORMAL HIGH (ref 4.0–10.5)
nRBC: 0.9 % — ABNORMAL HIGH (ref 0.0–0.2)

## 2023-11-01 NOTE — Progress Notes (Signed)
 Patient ID: Bailey Reese, female   DOB: August 08, 2005, 18 y.o.   MRN: [id number] Subjective: Bailey Reese  is a 18 y.o. female with medical history significant of sickle cell disease, PTSD, depression with anxiety who usually gets her care at [facility] but was seen at [facility] ER and admitted to [facility] for sickle cell pain crisis.    Patient continues to report unchanged  pain of 8/10, she states she still has slight nausea without vomiting. Denies fever, cough, headache, SOB, no urinary symptoms.   Objective:  Vital signs in last 24 hours:  Vitals:   11/01/23 0803 11/01/23 0855 11/01/23 1046 11/01/23 1056  BP:    (!) 110/56  Pulse:    89  Resp: 18   14  Temp:    98.2 F (36.8 C)  TempSrc:    Oral  SpO2: 98% 96%  100%  Weight:   66 kg   Height:   5' 4 (1.626 m)     Intake/Output from previous day:   Intake/Output Summary (Last 24 hours) at 11/01/2023 1114 Last data filed at 11/01/2023 1100 Gross per 24 hour  Intake 237 ml  Output --  Net 237 ml    Physical Exam: General: Alert, awake, oriented x3, in no acute distress.  HEENT: [place]/AT PEERL, EOMI Neck: Trachea midline,  no masses, no thyromegal,y no JVD, no carotid bruit OROPHARYNX:  Moist, No exudate/ erythema/lesions.  Heart: Regular rate and rhythm, without murmurs, rubs, gallops, PMI non-displaced, no heaves or thrills on palpation.  Lungs: Clear to auscultation, no wheezing or rhonchi noted. No increased vocal fremitus resonant to percussion  Abdomen: Soft, nontender, nondistended, positive bowel sounds, no masses no hepatosplenomegaly noted..  Neuro: No focal neurological deficits noted cranial nerves II through XII grossly intact. DTRs 2+ bilaterally upper and lower extremities. Strength 5 out of 5 in bilateral upper and lower extremities. Musculoskeletal: Lower back tenderness Psychiatric: Patient alert and oriented x3, good insight and cognition, good recent to remote recall. Lymph node  survey: No cervical axillary or inguinal lymphadenopathy noted.  Lab Results:  Basic Metabolic Panel:    Component Value Date/Time   NA 133 (L) 10/29/2023 2125   NA 135 05/01/2013 1440   K 3.5 10/29/2023 2125   K 4.3 05/01/2013 1440   CL 104 10/29/2023 2125   CL 102 05/01/2013 1440   CO2 20 (L) 10/29/2023 2125   CO2 27 (H) 05/01/2013 1440   BUN 7 10/29/2023 2125   BUN 13 05/01/2013 1440   CREATININE 0.47 10/30/2023 1202   CREATININE 0.40 (L) 05/01/2013 1440   GLUCOSE 92 10/29/2023 2125   GLUCOSE 108 (H) 05/01/2013 1440   CALCIUM 9.1 10/29/2023 2125   CALCIUM 9.8 05/01/2013 1440   CBC:    Component Value Date/Time   WBC 13.8 (H) 11/01/2023 0516   HGB 7.2 (L) 11/01/2023 0516   HGB 6.6 (L) 05/01/2013 1440   HCT 21.0 (L) 11/01/2023 0516   HCT 20.1 (L) 05/01/2013 1440   PLT 442 (H) 11/01/2023 0516   PLT 468 (H) 05/01/2013 1440   MCV 96.3 11/01/2023 0516   MCV 89 05/01/2013 1440   NEUTROABS 11.2 (H) 10/29/2023 2125   NEUTROABS 7.9 05/30/2012 1958   LYMPHSABS 3.0 10/29/2023 2125   LYMPHSABS 5.0 05/30/2012 1958   MONOABS 2.0 (H) 10/29/2023 2125   MONOABS 1.6 (H) 05/30/2012 1958   EOSABS 0.6 (H) 10/29/2023 2125   EOSABS 0.6 05/30/2012 1958   BASOSABS 0.1 10/29/2023 2125  BASOSABS 3 04/28/2013 1736   BASOSABS 0.2 (H) 05/30/2012 1958    No results found for this or any previous visit (from the past 240 hours).  Studies/Results: No results found.   Medications: Scheduled Meds:  buprenorphine   1 patch Transdermal Weekly   Deferasirox   360 mg Oral BID   enoxaparin  (LOVENOX ) injection  40 mg Subcutaneous Q24H   fluticasone  furoate-vilanterol  1 puff Inhalation Daily   HYDROmorphone    Intravenous Q4H   hydroxyurea   500 mg Oral BID   loratadine  10 mg Oral Daily   pantoprazole   40 mg Oral Daily   prazosin   1 mg Oral QHS   pregabalin   150 mg Oral TID   senna-docusate  1 tablet Oral BID   Continuous Infusions: PRN Meds:.diphenhydrAMINE , fluticasone , ondansetron   (ZOFRAN ) IV, oxyCODONE , polyethylene glycol, sodium chloride  flush  Consultants: None   Procedures: None  Antibiotics: None  Assessment/Plan: Active Problems:   Chronic pain syndrome   Leucocytosis   GAD (generalized anxiety disorder)   Mild intermittent asthma without complication   Sickle cell anemia with pain (HCC)   Hb Sickle Cell Disease with Pain crisis: Continue IVF 0.45% Saline @ KVO continue weight based Dilaudid  PCA, continue oral home pain medications as ordered. Monitor vitals very closely, Re-evaluate pain scale regularly, 2 L of Oxygen by [place]. Patient encouraged to ambulate on the hallway today.  Leukocytosis: Elevated most likely elevated due to vaso-occlusive crisis.  Will continue to monitor closely without antibiotics at this time.  Anemia of Chronic Disease: Hemoglobin is at baseline of 7.2 g/dl. Will continue to monitor closely and transfuse as appropriate  Chronic pain Syndrome: continue oral home pain medication as ordered.  Mild intermittent asthma without complication: Stable, no acute excerebration. Continue Symbicort as neede  GAD: Stable, continue medication as prescribed  Code Status: Full Code Family Communication: N/A Disposition Plan: Not yet ready for discharge  Lorel Roes NP   If 7PM-7AM, please contact night-coverage.  11/01/2023, 11:14 AM  LOS: 2 days

## 2023-11-01 NOTE — Plan of Care (Signed)

## 2023-11-02 LAB — CBC
HCT: 22.3 % — ABNORMAL LOW (ref 36.0–46.0)
Hemoglobin: 7.4 g/dL — ABNORMAL LOW (ref 12.0–15.0)
MCH: 33.2 pg (ref 26.0–34.0)
MCHC: 33.2 g/dL (ref 30.0–36.0)
MCV: 100 fL (ref 80.0–100.0)
Platelets: 470 10*3/uL — ABNORMAL HIGH (ref 150–400)
RBC: 2.23 MIL/uL — ABNORMAL LOW (ref 3.87–5.11)
RDW: 17.5 % — ABNORMAL HIGH (ref 11.5–15.5)
WBC: 14.4 10*3/uL — ABNORMAL HIGH (ref 4.0–10.5)
nRBC: 1.1 % — ABNORMAL HIGH (ref 0.0–0.2)

## 2023-11-02 MED ORDER — ONDANSETRON HCL 4 MG/2ML IJ SOLN
4.0000 mg | Freq: Four times a day (QID) | INTRAMUSCULAR | Status: AC | PRN
  Administered 2023-11-02 (×2): 4 mg via INTRAVENOUS
  Filled 2023-11-02 (×2): qty 2

## 2023-11-02 MED ORDER — ORAL CARE MOUTH RINSE: 15.0000 mL | OROMUCOSAL | Status: DC | PRN

## 2023-11-02 NOTE — Plan of Care (Signed)

## 2023-11-02 NOTE — TOC Initial Note (Signed)
 Transition of Care [facility]) - Initial/Assessment Note    Patient Details  Name: Bailey Reese MRN: [id number] Date of Birth: [birth year]  Transition of Care [facility]) CM/SW Contact:    Levie Ream, RN Phone Number: 11/02/2023, 10:20 AM  Clinical Narrative:                 Spoke w/ pt in room; pt says she lives at home w/ her family; she plans to return at d/c; pt identified POC mother Lindajean Res [phone]); her family will provide transportation; she denied SDOH risks; pt says she has walker; she has home oxygen for [facility]-time use from [facility]; pt says she has a full travel tank if needed; pt says she does not receive HH services; TOC will follow.  Expected Discharge Plan: Home/Self Care Barriers to Discharge: Continued Medical Work up   Patient Goals and CMS Choice Patient states their goals for this hospitalization and ongoing recovery are:: home          Expected Discharge Plan and Services   Discharge Planning Services: CM Consult   Living arrangements for the past 2 months: Single Family Home                                      Prior Living Arrangements/Services Living arrangements for the past 2 months: Single Family Home Lives with:: Relatives Patient language and need for interpreter reviewed:: Yes Do you feel safe going back to the place where you live?: Yes      Need for Family Participation in Patient Care: Yes (Comment) Care giver support system in place?: Yes (comment) Current home services: DME (walker; home oxygen) Criminal Activity/Legal Involvement Pertinent to Current Situation/Hospitalization: No - Comment as needed  Activities of Daily Living   ADL Screening (condition at time of admission) Independently performs ADLs?: Yes (appropriate for developmental age) Is the patient deaf or have difficulty hearing?: No Does the patient have difficulty seeing, even when wearing glasses/contacts?: No Does the patient have  difficulty concentrating, remembering, or making decisions?: No  Permission Sought/Granted Permission sought to share information with : Case Manager Permission granted to share information with : Yes, Verbal Permission Granted  Share Information with NAME: Case Manager     Permission granted to share info w Relationship: Lindajean Res (mother) [phone]     Emotional Assessment Appearance:: Appears stated age Attitude/Demeanor/Rapport: Gracious Affect (typically observed): Accepting Orientation: : Oriented to Self, Oriented to Place, Oriented to  Time, Oriented to Situation Alcohol  / Substance Use: Not Applicable Psych Involvement: No (comment)  Admission diagnosis:  Sickle cell anemia with pain (HCC) [D57.00] Patient Active Problem List   Diagnosis Date Noted   Sickle cell anemia with pain (HCC) 08/21/2023   GI bleed 07/03/2023   Vasoocclusive sickle cell crisis (HCC) 07/02/2023   Sickle cell anemia with crisis (HCC) 05/28/2023   Chronic pain syndrome 05/28/2023   Leucocytosis 05/28/2023   Thrombocytosis 05/28/2023   GAD (generalized anxiety disorder) 03/29/2023   Obstructive sleep apnea syndrome 02/04/2022   Mild intermittent asthma without complication 02/04/2022   Family dysfunction    Sickle cell crisis (HCC) 08/23/2017   Sickle cell pain crisis (HCC) 08/23/2017   PCP:  Gladys Lamp, MD Pharmacy:   [facility] [address]), [place] - [address] [place]) [place] [address] Phone: [phone] Fax: [phone]     Social  Drivers of Health (SDOH) Social History: SDOH Screenings   Food Insecurity: No Food Insecurity (11/02/2023)  Housing: Low Risk  (11/02/2023)  Transportation Needs: No Transportation Needs (11/02/2023)  Utilities: Not At Risk (11/02/2023)  Financial Resource Strain: Low Risk  (07/25/2023)   Received from [facility]  Physical Activity: Insufficiently Active (12/10/2021)    Received from [facility]  Social Connections: Socially Isolated (10/30/2023)  Stress: Stress Concern Present (12/10/2021)   Received from [facility]  Tobacco Use: Low Risk  (11/01/2023)   SDOH Interventions: Food Insecurity Interventions: Intervention Not Indicated, Inpatient TOC Housing Interventions: Intervention Not Indicated, Inpatient TOC Transportation Interventions: Intervention Not Indicated, Inpatient TOC Utilities Interventions: Intervention Not Indicated, Inpatient TOC   Readmission Risk Interventions    11/02/2023   10:18 AM 10/18/2023    2:34 PM  Readmission Risk Prevention Plan  Transportation Screening Complete Complete  PCP or Specialist Appt within 3-5 Days Complete Complete  HRI or Home Care Consult Complete Complete  Social Work Consult for Recovery Care Planning/Counseling Complete Complete  Palliative Care Screening Not Applicable Complete  Medication Review Oceanographer) Complete Referral to Pharmacy

## 2023-11-02 NOTE — Progress Notes (Signed)
 Patient ID: Bailey Reese, female   DOB: [birth year], 18 y.o.   MRN: [id number] Subjective: Bailey Reese  is a 18 y.o. female with medical history significant of sickle cell disease, PTSD, depression with anxiety who usually gets her care at [facility] but was seen at [facility] ER and admitted to [facility] for sickle cell pain crisis.    Patient continues to report unchanged pain today of 8/10, she states she still has slight nausea without vomiting. Denies fever, cough, headache, SOB, no urinary symptoms.   Objective:  Vital signs in last 24 hours:  Vitals:   11/02/23 0800 11/02/23 0927 11/02/23 1155 11/02/23 1242  BP:   118/68   Pulse:   86   Resp: 16  17 19   Temp:   98.6 F (37 C)   TempSrc:   Oral   SpO2:  96% 99% 98%  Weight:      Height:        Intake/Output from previous day:   Intake/Output Summary (Last 24 hours) at 11/02/2023 1418 Last data filed at 11/02/2023 1208 Gross per 24 hour  Intake --  Output 1650 ml  Net -1650 ml    Physical Exam: General: Alert, awake, oriented x3, in no acute distress.  HEENT: [place]/AT PEERL, EOMI Neck: Trachea midline,  no masses, no thyromegal,y no JVD, no carotid bruit OROPHARYNX:  Moist, No exudate/ erythema/lesions.  Heart: Regular rate and rhythm, without murmurs, rubs, gallops, PMI non-displaced, no heaves or thrills on palpation.  Lungs: Clear to auscultation, no wheezing or rhonchi noted. No increased vocal fremitus resonant to percussion  Abdomen: Soft, nontender, nondistended, positive bowel sounds, no masses no hepatosplenomegaly noted..  Neuro: No focal neurological deficits noted cranial nerves II through XII grossly intact. DTRs 2+ bilaterally upper and lower extremities. Strength 5 out of 5 in bilateral upper and lower extremities. Musculoskeletal: Lower back tenderness Psychiatric: Patient alert and oriented x3, good insight and cognition, good recent to remote recall. Lymph node survey: No cervical  axillary or inguinal lymphadenopathy noted.  Lab Results:  Basic Metabolic Panel:    Component Value Date/Time   NA 133 (L) 10/29/2023 2125   NA 135 05/01/2013 1440   K 3.5 10/29/2023 2125   K 4.3 05/01/2013 1440   CL 104 10/29/2023 2125   CL 102 05/01/2013 1440   CO2 20 (L) 10/29/2023 2125   CO2 27 (H) 05/01/2013 1440   BUN 7 10/29/2023 2125   BUN 13 05/01/2013 1440   CREATININE 0.47 10/30/2023 1202   CREATININE 0.40 (L) 05/01/2013 1440   GLUCOSE 92 10/29/2023 2125   GLUCOSE 108 (H) 05/01/2013 1440   CALCIUM 9.1 10/29/2023 2125   CALCIUM 9.8 05/01/2013 1440   CBC:    Component Value Date/Time   WBC 14.4 (H) 11/02/2023 1108   HGB 7.4 (L) 11/02/2023 1108   HGB 6.6 (L) 05/01/2013 1440   HCT 22.3 (L) 11/02/2023 1108   HCT 20.1 (L) 05/01/2013 1440   PLT 470 (H) 11/02/2023 1108   PLT 468 (H) 05/01/2013 1440   MCV 100.0 11/02/2023 1108   MCV 89 05/01/2013 1440   NEUTROABS 11.2 (H) 10/29/2023 2125   NEUTROABS 7.9 05/30/2012 1958   LYMPHSABS 3.0 10/29/2023 2125   LYMPHSABS 5.0 05/30/2012 1958   MONOABS 2.0 (H) 10/29/2023 2125   MONOABS 1.6 (H) 05/30/2012 1958   EOSABS 0.6 (H) 10/29/2023 2125   EOSABS 0.6 05/30/2012 1958   BASOSABS 0.1 10/29/2023 2125   BASOSABS 3 04/28/2013 1736  BASOSABS 0.2 (H) 05/30/2012 1958    No results found for this or any previous visit (from the past 240 hours).  Studies/Results: No results found.   Medications: Scheduled Meds:  buprenorphine   1 patch Transdermal Weekly   Deferasirox   360 mg Oral BID   enoxaparin  (LOVENOX ) injection  40 mg Subcutaneous Q24H   fluticasone  furoate-vilanterol  1 puff Inhalation Daily   HYDROmorphone    Intravenous Q4H   hydroxyurea   500 mg Oral BID   loratadine  10 mg Oral Daily   pantoprazole   40 mg Oral Daily   prazosin   1 mg Oral QHS   pregabalin   150 mg Oral TID   senna-docusate  1 tablet Oral BID   Continuous Infusions: PRN Meds:.diphenhydrAMINE , fluticasone , ondansetron  (ZOFRAN ) IV, mouth  rinse, oxyCODONE , polyethylene glycol, sodium chloride  flush  Consultants: None   Procedures: None  Antibiotics: None  Assessment/Plan: Active Problems:   Chronic pain syndrome   Leucocytosis   GAD (generalized anxiety disorder)   Mild intermittent asthma without complication   Sickle cell anemia with pain (HCC)   Hb Sickle Cell Disease with Pain crisis: Continue IVF 0.45% Saline @ KVO continue weight based Dilaudid  PCA, continue oral home pain medications as ordered. Monitor vitals very closely, Re-evaluate pain scale regularly, 2 L of Oxygen by [place]. Patient encouraged to ambulate on the hallway today.  Leukocytosis: Elevated most likely elevated due to vaso-occlusive crisis.  Will continue to monitor closely without antibiotics at this time.  Anemia of Chronic Disease: Hemoglobin is at baseline of 7.2 g/dl. Will continue to monitor closely and transfuse as appropriate  Chronic pain Syndrome: continue oral home pain medication as ordered.  Mild intermittent asthma without complication: Stable, no acute excerebration. Continue Symbicort as neede  GAD: Stable, continue medication as prescribed  Code Status: Full Code Family Communication: N/A Disposition Plan: ready for discharge in the AM  Lorel Roes NP   If 7PM-7AM, please contact night-coverage.  11/02/2023, 2:18 PM  LOS: 3 days

## 2023-11-03 MED ORDER — HYDROMORPHONE 1 MG/ML IV SOLN
INTRAVENOUS | Status: DC
  Administered 2023-11-03: 13 mg via INTRAVENOUS
  Administered 2023-11-03: 9.4 mg via INTRAVENOUS

## 2023-11-03 MED ORDER — ONDANSETRON HCL 4 MG/2ML IJ SOLN
4.0000 mg | Freq: Four times a day (QID) | INTRAMUSCULAR | Status: DC | PRN
  Administered 2023-11-03: 4 mg via INTRAVENOUS
  Filled 2023-11-03: qty 2

## 2023-11-03 NOTE — Plan of Care (Signed)
 Ready to discharge to home.

## 2023-11-03 NOTE — Discharge Summary (Signed)
 Physician Discharge Summary  Bailey Reese UVO:[id number] DOB: 03-Feb-2006 DOA: 10/30/2023  PCP: Gladys Lamp, MD  Admit date: 10/30/2023  Discharge date: 11/03/2023  Discharge Diagnoses:  Active Problems:   Chronic pain syndrome   Leucocytosis   GAD (generalized anxiety disorder)   Mild intermittent asthma without complication   Sickle cell anemia with pain [facility])   Discharge Condition: Stable  Disposition:  Pt is discharged home in good condition and is to follow up with Little, Ozell Blunt, MD this week to have labs evaluated. [name] is instructed to increase activity slowly and balance with rest for the next few days, and use prescribed medication to complete treatment of pain  Diet: Regular Wt Readings from Last 3 Encounters:  11/01/23 66 kg (76%, Z= 0.71)*  10/29/23 65.8 kg (76%, Z= 0.69)*  10/16/23 68 kg (80%, Z= 0.86)*   * Growth percentiles are based on CDC (Girls, 2-20 Years) data.    History of present illness:  Bailey Reese  is a 18 y.o. female with history of sickle cell disease, PTSD, depression with anxiety who usually gets her care at [facility] but was seen at [facility] for abdominal pain , fever and emesis not typical of her sickle cell pain.   Patient was managed in the ER for crisis however her pain was not controlled. Patient was transferred to our care for continued sickle cell crisis pain control.  She has vitals that are stable.  Hemoglobin is around 9.6 g/dl.  She was getting IV Dilaudid  pushes in the ER.  Patient denied any nausea vomiting or diarrhea.  Denied any viral illness.  She has had previous osteomyelitis and multiple episodes of acute chest syndromes.  Also hemorrhagic stroke in 2017.  Patient has chronic pain from her sickle cell disease.  No recent travels or sick contacts.    ED Course:  Patient was treated in the ED with IVF, IV pain medication with no resolution to symptoms. Patient admitted for ongoing  sickle cell pain management.  BP: 124/67  Pulse: 85  Resp: 16  Temp: 98.4 F (36.9 C)  SpO2: 99%     Sodium 133 (*)        CO2 20 (*)      Creatinine, Ser 0.39 (*)      Total Bilirubin 3.3 (*)      All other components within normal limits  CBC WITH DIFFERENTIAL/PLATELET - Abnormal; Notable for the following components:    WBC 16.9 (*)      RBC 2.48 (*)      Hemoglobin 8.1 (*)      HCT 23.5 (*)      RDW 17.4 (*)      Platelets 466 (*)      nRBC 2.4 (*)      Neutro Abs 11.2 (*)      Monocytes Absolute 2.0 (*)      Eosinophils Absolute 0.6 (*)      Abs Immature Granulocytes 0.08 (*)      All other components within normal limits  RETICULOCYTES - Abnormal; Notable for the following components:    Retic Ct Pct 12.9 (*)      RBC. 2.28 (*)      Retic Count, Absolute 294.6 (*)      Immature Retic Fract 42.2 (*)      All other components within normal limits  URINALYSIS, ROUTINE W REFLEX MICROSCOPIC - Abnormal; Notable for the following components:    Color, Urine  YELLOW (*)      APPearance CLEAR (*)      All other components within normal limits  PREGNANCY, URINE  POC URINE PREG, ED    Hospital Course:  Patient was admitted for sickle cell pain crisis and managed appropriately with IVF, IV Dilaudid  via PCA and IV Toradol , as well as other adjunct therapies per sickle cell pain management protocols. Patient is reporting improved pain, walking without assistance tolerating PO without obstruction. Patient will continue to manage chronic pain at home.  She has no new concerns Patient was therefore discharged home today in a hemodynamically stable condition.   Shavana will follow-up with PCP within 1 week of this discharge. Oneal was counseled extensively about nonpharmacologic means of pain management, patient verbalized understanding and was appreciative of  the care received during this admission.   We discussed the need for good hydration, monitoring of hydration status,  avoidance of heat, cold, stress, and infection triggers. We discussed the need to be adherent with taking other home medications. Patient was reminded of the need to seek medical attention immediately if any symptom of bleeding, anemia, or infection occurs.  Discharge Exam: Vitals:   11/03/23 1216 11/03/23 1354  BP:  113/62  Pulse:  94  Resp: 15 18  Temp:  98.6 F (37 C)  SpO2:  95%   Vitals:   11/03/23 1018 11/03/23 1020 11/03/23 1216 11/03/23 1354  BP: 113/65   113/62  Pulse: 100   94  Resp: 17 17 15 18   Temp: 98.6 F (37 C)   98.6 F (37 C)  TempSrc: Oral   Oral  SpO2: 98%   95%  Weight:      Height:        General appearance : Awake, alert, not in any distress. Speech Clear. Not toxic looking HEENT: Atraumatic and Normocephalic, pupils equally reactive to light and accomodation Neck: Supple, no JVD. No cervical lymphadenopathy.  Chest: Good air entry bilaterally, no added sounds  CVS: S1 S2 regular, no murmurs.  Abdomen: Bowel sounds present, Non tender and not distended with no gaurding, rigidity or rebound. Extremities: B/L Lower Ext shows no edema, both legs are warm to touch Neurology: Awake alert, and oriented X 3, CN II-XII intact, Non focal Skin: No Rash  Discharge Instructions  Discharge Instructions     Call MD for:  severe uncontrolled pain   Complete by: As directed    Call MD for:  temperature >100.4   Complete by: As directed    Diet - low sodium heart healthy   Complete by: As directed    Increase activity slowly   Complete by: As directed       Allergies as of 11/03/2023       Reactions   Cherry Anaphylaxis, Itching, Swelling   Olanzapine  Other (See Comments)   Drug-induced liver injury   Other Swelling, Other (See Comments)   Pitted fruits   Peanut-containing Drug Products Anaphylaxis, Itching, Swelling   Plum Pulp Anaphylaxis, Hives   Droperidol Other (See Comments)   Pt states muscle tension, involuntary movements, eye drift,  anxiety   Morphine  And Codeine Hives, Itching   Peach [prunus Persica] Itching, Swelling, Other (See Comments)   Acetaminophen  Nausea And Vomiting   Compazine [prochlorperazine] Other (See Comments), Hypertension   Patient stated that this medication causes her HR to increase and well as BP. She also states that it cause hallucinations.   Ibuprofen  Nausea And Vomiting  Medication List     TAKE these medications    budesonide-formoterol  80-4.5 MCG/ACT inhaler Commonly known as: SYMBICORT Inhale 2 puffs into the lungs See admin instructions. Inhale 2 puffs by mouth twice daily and an extra puff as needed.   buprenorphine  20 MCG/HR Ptwk Commonly known as: BUTRANS  Place 1 patch onto the skin once a week.   CALCIUM 600+D3 PO Take 1 tablet by mouth daily.   Deferasirox  360 MG Tabs Take 360 mg by mouth 2 (two) times daily.   ergocalciferol 1.25 MG (50000 UT) capsule Commonly known as: VITAMIN D2 Take 50,000 Units by mouth once a week.   esomeprazole  40 MG capsule Commonly known as: NEXIUM  Take 1 capsule (40 mg total) by mouth daily as needed (reflux).   fexofenadine 180 MG tablet Commonly known as: ALLEGRA Take 180 mg by mouth daily as needed for allergies.   fluticasone  50 MCG/ACT nasal spray Commonly known as: FLONASE  Place 1 spray into both nostrils daily as needed for allergies or rhinitis.   hydroxyurea  500 MG capsule Commonly known as: HYDREA  Take 500 mg by mouth 2 (two) times daily. May take with food to minimize GI side effects.   hydrOXYzine  25 MG tablet Commonly known as: ATARAX  Take 25 mg by mouth See admin instructions. Take 25mg  (1 tablet) by mouth twice daily and an extra tablet if needed.   lubiprostone  8 MCG capsule Commonly known as: AMITIZA  Take 8 mcg by mouth 2 (two) times daily as needed for constipation.   ondansetron  4 MG disintegrating tablet Commonly known as: ZOFRAN -ODT Take 4 mg by mouth every 8 (eight) hours as needed for nausea or  vomiting (dissolve orally).   Oxycodone  HCl 10 MG Tabs Take 1 tablet (10 mg total) by mouth every 6 (six) hours as needed (pain).   pantoprazole  40 MG tablet Commonly known as: Protonix  Take 1 tablet (40 mg total) by mouth 2 (two) times daily.   prazosin  1 MG capsule Commonly known as: MINIPRESS  Take 1 mg by mouth at bedtime.   pregabalin  150 MG capsule Commonly known as: LYRICA  Take 150 mg by mouth 3 (three) times daily.   prochlorperazine 5 MG tablet Commonly known as: COMPAZINE Take 5 mg by mouth every 6 (six) hours as needed for nausea or vomiting.   promethazine 12.5 MG tablet Commonly known as: PHENERGAN Take 12.5 mg by mouth every 6 (six) hours as needed for nausea or vomiting.   Sleep Caps Take 1 capsule by mouth at bedtime.        The results of significant diagnostics from this hospitalization (including imaging, microbiology, ancillary and laboratory) are listed below for reference.    Significant Diagnostic Studies: CT ABDOMEN PELVIS W CONTRAST Result Date: 10/29/2023 EXAM: CT ABDOMEN AND PELVIS WITH CONTRAST 10/29/2023 11:19:47 PM TECHNIQUE: CT of the abdomen and pelvis was performed with the administration of intravenous contrast. Multiplanar reformatted images are provided for review. Automated exposure control, iterative reconstruction, and/or weight based adjustment of the mA/kV was utilized to reduce the radiation dose to as low as reasonably achievable. COMPARISON: None available. CLINICAL HISTORY: RLQ, LUQ pain. fever, emesis. sickle cell pain crisis. eval appy. Pt presents to ER from home with sickle pain for the past 2 days. Pt reports she recently got discharged from hospital and her PCP changed who prescribed the medicine and was not able to fill her prescription. Pt talks in complete sentences mild distress noted. FINDINGS: LOWER CHEST: No acute abnormality. LIVER: The liver is unremarkable. GALLBLADDER AND BILE  DUCTS: Status post cholecystectomy. Mildly  thick gallbladder. SPLEEN: No acute abnormality. PANCREAS: No acute abnormality. ADRENAL GLANDS: No acute abnormality. KIDNEYS, URETERS AND BLADDER: No stones in the kidneys or ureters. No hydronephrosis. No perinephric or periureteral stranding. Urinary bladder is unremarkable. GI AND BOWEL: Stomach demonstrates no acute abnormality. There is no bowel obstruction. No bowel wall thickening. Normal appendix (coronal image 34). Mild-to-moderate colonic stool burden. PERITONEUM AND RETROPERITONEUM: No ascites. No free air. VASCULATURE: Aorta is normal in caliber. LYMPH NODES: No lymphadenopathy. REPRODUCTIVE ORGANS: Retroverted uterus. BONES AND SOFT TISSUES: No acute osseous abnormality. No focal soft tissue abnormality. IMPRESSION: 1. Normal appendix. 2. No acute findings. 3. Status post cholecystectomy with mildly thick gallbladder. 4. Retroverted uterus. 5. Mild-to-moderate colonic stool burden. Electronically signed by: Zadie Herter MD 10/29/2023 11:25 PM EDT RP Workstation: [id number]   DG Chest Portable 1 View Result Date: 10/29/2023 CLINICAL DATA:  Chest pain, fever and sickle cell disease. EXAM: PORTABLE CHEST 1 VIEW COMPARISON:  AP and lateral chest 10/16/2023 FINDINGS: The heart is slightly enlarged. No vascular congestion is seen. The mediastinum is normally outlined. The lungs are clear. The sulci are sharp. Thoracic cage is intact with mild upper thoracic levoscoliosis. Compare: Stable chest with slight cardiomegaly. IMPRESSION: No evidence of acute chest disease. Slight cardiomegaly. Electronically Signed   By: Denman Fischer M.D.   On: 10/29/2023 22:28   DG Chest 2 View Result Date: 10/16/2023 CLINICAL DATA:  Chest pain for 3 days.  History of sickle cell. EXAM: CHEST - 2 VIEW COMPARISON:  Radiographs 08/21/2023 and 05/31/2023.  CT 05/27/2023. FINDINGS: Mild patient rotation to the right. The heart size is stable at the upper limits of normal. There is mild atelectasis at both lung bases. No  edema, confluent airspace disease, pleural effusion or pneumothorax. Mild thoracic scoliosis without acute osseous abnormality. IMPRESSION: Mild bibasilar atelectasis. No evidence of acute chest process. Electronically Signed   By: Elmon Hagedorn M.D.   On: 10/16/2023 08:36    Microbiology: No results found for this or any previous visit (from the past 240 hours).   Labs: Basic Metabolic Panel: Recent Labs  Lab 10/29/23 2125 10/30/23 1202  NA 133*  --   K 3.5  --   CL 104  --   CO2 20*  --   GLUCOSE 92  --   BUN 7  --   CREATININE 0.39* 0.47  CALCIUM 9.1  --    Liver Function Tests: Recent Labs  Lab 10/29/23 2125  AST 36  ALT 42  ALKPHOS 108  BILITOT 3.3*  PROT 7.9  ALBUMIN 3.9   No results for input(s): LIPASE, AMYLASE in the last 168 hours. No results for input(s): AMMONIA in the last 168 hours. CBC: Recent Labs  Lab 10/29/23 2125 10/30/23 1202 10/31/23 0534 11/01/23 0516 11/02/23 1108  WBC 16.9* 14.4* 11.2* 13.8* 14.4*  NEUTROABS 11.2*  --   --   --   --   HGB 8.1* 8.1* 7.3* 7.2* 7.4*  HCT 23.5* 23.9* 21.8* 21.0* 22.3*  MCV 94.8 98.0 98.2 96.3 100.0  PLT 466* 511* 433* 442* 470*   Cardiac Enzymes: No results for input(s): CKTOTAL, CKMB, CKMBINDEX, TROPONINI in the last 168 hours. BNP: Invalid input(s): POCBNP CBG: No results for input(s): GLUCAP in the last 168 hours.  Time coordinating discharge: 50 minutes  Signed:  Lorel Roes NP   [facility] 11/03/2023, 2:16 PM

## 2023-11-03 NOTE — Plan of Care (Signed)

## 2023-11-03 NOTE — Plan of Care (Signed)
  Problem: Education: Goal: Knowledge of General Education information will improve Description: Including pain rating scale, medication(s)/side effects and non-pharmacologic comfort measures Outcome: Adequate for Discharge   Problem: Health Behavior/Discharge Planning: Goal: Ability to manage health-related needs will improve Outcome: Adequate for Discharge   Problem: Clinical Measurements: Goal: Ability to maintain clinical measurements within normal limits will improve Outcome: Adequate for Discharge Goal: Will remain free from infection Outcome: Adequate for Discharge Goal: Diagnostic test results will improve Outcome: Adequate for Discharge Goal: Respiratory complications will improve Outcome: Adequate for Discharge Goal: Cardiovascular complication will be avoided Outcome: Adequate for Discharge   Problem: Activity: Goal: Risk for activity intolerance will decrease Outcome: Adequate for Discharge   Problem: Nutrition: Goal: Adequate nutrition will be maintained Outcome: Adequate for Discharge   Problem: Coping: Goal: Level of anxiety will decrease Outcome: Adequate for Discharge   Problem: Elimination: Goal: Will not experience complications related to bowel motility Outcome: Adequate for Discharge Goal: Will not experience complications related to urinary retention Outcome: Adequate for Discharge   Problem: Pain Managment: Goal: General experience of comfort will improve and/or be controlled Outcome: Adequate for Discharge   Problem: Safety: Goal: Ability to remain free from injury will improve Outcome: Adequate for Discharge   Problem: Skin Integrity: Goal: Risk for impaired skin integrity will decrease Outcome: Adequate for Discharge   Problem: Education: Goal: Knowledge of vaso-occlusive preventative measures will improve Outcome: Adequate for Discharge Goal: Awareness of infection prevention will improve Outcome: Adequate for Discharge Goal:  Awareness of signs and symptoms of anemia will improve Outcome: Adequate for Discharge Goal: Long-term complications will improve Outcome: Adequate for Discharge   Problem: Self-Care: Goal: Ability to incorporate actions that prevent/reduce pain crisis will improve Outcome: Adequate for Discharge   Problem: Bowel/Gastric: Goal: Gut motility will be maintained Outcome: Adequate for Discharge   Problem: Tissue Perfusion: Goal: Complications related to inadequate tissue perfusion will be avoided or minimized Outcome: Adequate for Discharge   Problem: Respiratory: Goal: Pulmonary complications will be avoided or minimized Outcome: Adequate for Discharge Goal: Acute Chest Syndrome will be identified early to prevent complications Outcome: Adequate for Discharge   Problem: Fluid Volume: Goal: Ability to maintain a balanced intake and output will improve Outcome: Adequate for Discharge   Problem: Sensory: Goal: Pain level will decrease with appropriate interventions Outcome: Adequate for Discharge   Problem: Health Behavior: Goal: Postive changes in compliance with treatment and prescription regimens will improve Outcome: Adequate for Discharge

## 2023-11-04 ENCOUNTER — Other Ambulatory Visit (HOSPITAL_COMMUNITY): Payer: Self-pay

## 2023-11-08 ENCOUNTER — Other Ambulatory Visit: Payer: Self-pay

## 2023-11-08 ENCOUNTER — Emergency Department
Admission: EM | Admit: 2023-11-08 | Discharge: 2023-11-08 | Disposition: A | Discharge status: Home / Self Care | Attending: Emergency Medicine | Admitting: Emergency Medicine

## 2023-11-08 DIAGNOSIS — T450X1A Poisoning by antiallergic and antiemetic drugs, accidental (unintentional), initial encounter: Secondary | ICD-10-CM | POA: Diagnosis present

## 2023-11-08 DIAGNOSIS — Z5329 Procedure and treatment not carried out because of patient's decision for other reasons: Secondary | ICD-10-CM | POA: Diagnosis not present

## 2023-11-08 DIAGNOSIS — T50901A Poisoning by unspecified drugs, medicaments and biological substances, accidental (unintentional), initial encounter: Secondary | ICD-10-CM

## 2023-11-08 LAB — URINE DRUG SCREEN, QUALITATIVE (ARMC ONLY)
Amphetamines, Ur Screen: NOT DETECTED
Barbiturates, Ur Screen: NOT DETECTED
Benzodiazepine, Ur Scrn: NOT DETECTED
Cannabinoid 50 Ng, Ur ~~LOC~~: NOT DETECTED
Cocaine Metabolite,Ur ~~LOC~~: NOT DETECTED
MDMA (Ecstasy)Ur Screen: NOT DETECTED
Methadone Scn, Ur: NOT DETECTED
Opiate, Ur Screen: NOT DETECTED
Phencyclidine (PCP) Ur S: NOT DETECTED
Tricyclic, Ur Screen: NOT DETECTED

## 2023-11-08 LAB — COMPREHENSIVE METABOLIC PANEL WITH GFR
ALT: 108 U/L — ABNORMAL HIGH (ref 0–44)
AST: 72 U/L — ABNORMAL HIGH (ref 15–41)
Albumin: 4.5 g/dL (ref 3.5–5.0)
Alkaline Phosphatase: 142 U/L — ABNORMAL HIGH (ref 38–126)
Anion gap: 13 (ref 5–15)
BUN: 11 mg/dL (ref 6–20)
CO2: 20 mmol/L — ABNORMAL LOW (ref 22–32)
Calcium: 9.5 mg/dL (ref 8.9–10.3)
Chloride: 107 mmol/L (ref 98–111)
Creatinine, Ser: 0.49 mg/dL (ref 0.44–1.00)
GFR, Estimated: >60 mL/min (ref >60)
Glucose, Bld: 91 mg/dL (ref 70–99)
Potassium: 3.6 mmol/L (ref 3.5–5.1)
Sodium: 140 mmol/L (ref 135–145)
Total Bilirubin: 4 mg/dL — ABNORMAL HIGH (ref 0.0–1.2)
Total Protein: 8.2 g/dL — ABNORMAL HIGH (ref 6.5–8.1)

## 2023-11-08 LAB — ACETAMINOPHEN LEVEL: Acetaminophen (Tylenol), Serum: <10 ug/mL — ABNORMAL LOW (ref 10–30)

## 2023-11-08 LAB — ETHANOL: Alcohol, Ethyl (B): <15 mg/dL (ref <15)

## 2023-11-08 LAB — CBC WITH DIFFERENTIAL/PLATELET
Abs Immature Granulocytes: 0.11 10*3/uL — ABNORMAL HIGH (ref 0.00–0.07)
Basophils Absolute: 0.1 10*3/uL (ref 0.0–0.1)
Basophils Relative: 1 %
Eosinophils Absolute: 0.1 10*3/uL (ref 0.0–0.5)
Eosinophils Relative: 0 %
HCT: 22.8 % — ABNORMAL LOW (ref 36.0–46.0)
Hemoglobin: 8.1 g/dL — ABNORMAL LOW (ref 12.0–15.0)
Immature Granulocytes: 1 %
Lymphocytes Relative: 6 %
Lymphs Abs: 1.1 10*3/uL (ref 0.7–4.0)
MCH: 33.9 pg (ref 26.0–34.0)
MCHC: 35.5 g/dL (ref 30.0–36.0)
MCV: 95.4 fL (ref 80.0–100.0)
Monocytes Absolute: 1.7 10*3/uL — ABNORMAL HIGH (ref 0.1–1.0)
Monocytes Relative: 9 %
Neutro Abs: 15.8 10*3/uL — ABNORMAL HIGH (ref 1.7–7.7)
Neutrophils Relative %: 83 %
Platelets: 485 10*3/uL — ABNORMAL HIGH (ref 150–400)
RBC: 2.39 MIL/uL — ABNORMAL LOW (ref 3.87–5.11)
RDW: 19.6 % — ABNORMAL HIGH (ref 11.5–15.5)
WBC: 19 10*3/uL — ABNORMAL HIGH (ref 4.0–10.5)
nRBC: 1.4 % — ABNORMAL HIGH (ref 0.0–0.2)

## 2023-11-08 LAB — POC URINE PREG, ED: Preg Test, Ur: NEGATIVE

## 2023-11-08 LAB — SALICYLATE LEVEL: Salicylate Lvl: <7 mg/dL — ABNORMAL LOW (ref 7.0–30.0)

## 2023-11-08 NOTE — ED Notes (Signed)
 Lab consulted to come draw labs as Pt is a sickle cell Pt, this RN attempted twice and float RN tied up w/EMS at the moment.

## 2023-11-08 NOTE — ED Notes (Signed)
 Pt eloped prior to MD reassessment. RN notified MD. Pt did not have IV placed. Pt's room empty and pt unable to be located by this RN. Pt family member and belongings not present in room. All monitoring equipment removed. This RN unable to get updated vital signs. Pt left without notifying staff.

## 2023-11-08 NOTE — ED Provider Notes (Signed)
 [facility] Provider Note    Event Date/Time   First MD Initiated Contact with Patient 11/08/23 [phone]     (approximate)   History   Chief Complaint: Drug Overdose   HPI  Bailey Reese is a 18 y.o. female with a history of sickle cell anemia and insomnia who reports that last night due to difficulty sleeping she accidentally took too many doses of over-the-counter doxylamine 25 mg.  She estimates she took between 5 and 10 tablets.  Complains of feeling groggy.  No hallucinations.     Physical Exam   Triage Vital Signs: ED Triage Vitals [11/08/23 0612]  Encounter Vitals Group     BP (!) 144/86     Girls Systolic BP Percentile      Girls Diastolic BP Percentile      Boys Systolic BP Percentile      Boys Diastolic BP Percentile      Pulse Rate (!) 110     Resp 18     Temp 98.9 F (37.2 C)     Temp src      SpO2 97 %     Weight 145 lb (65.8 kg)     Height 5' 4 (1.626 m)     Head Circumference      Peak Flow      Pain Score      Pain Loc      Pain Education      Exclude from Growth Chart     Most recent vital signs: Vitals:   11/08/23 0612  BP: (!) 144/86  Pulse: (!) 110  Resp: 18  Temp: 98.9 F (37.2 C)  SpO2: 97%    General: Awake, no distress. CV:  Good peripheral perfusion.  Tachycardia heart rate 110 Resp:  Normal effort.  Clear to auscultation bilaterally Abd:  No distention.  Soft nontender Other:  No wounds.  PERRL, EOMI, no nystagmus.  Moist oral mucosa   ED Results / Procedures / Treatments   Labs (all labs ordered are listed, but only abnormal results are displayed) Labs Reviewed  ACETAMINOPHEN  LEVEL  COMPREHENSIVE METABOLIC PANEL WITH GFR  ETHANOL  SALICYLATE LEVEL  CBC WITH DIFFERENTIAL/PLATELET  URINE DRUG SCREEN, QUALITATIVE ([facility] ONLY)  POC URINE PREG, ED     EKG Interpreted by me Sinus tachycardia rate 106.  Normal axis and intervals.  Normal QRS ST segments and T waves.  No signs of cardiac  toxicity   RADIOLOGY    PROCEDURES:  Procedures   MEDICATIONS ORDERED IN ED: Medications - No data to display   IMPRESSION / MDM / ASSESSMENT AND PLAN / ED COURSE  I reviewed the triage vital signs and the nursing notes.  Patient's presentation is most consistent with acute presentation with potential threat to life or bodily function.  Patient presents with accidental overdose of doxylamine in attempt to sleep last night.  This occurred over several doses between 10 PM and 5 AM.  Denies any, ingestion.  Has not taken any sedatives or opioids tonight.  Exam is reassuring.  Unlikely to experience serious anticholinergic toxicity.  Will observe in the ED to ensure no worsening symptoms.     FINAL CLINICAL IMPRESSION(S) / ED DIAGNOSES   Final diagnoses:  Accidental drug overdose, initial encounter     Rx / DC Orders   ED Discharge Orders     None        Note:  This document was prepared using Dragon voice recognition software and  may include unintentional dictation errors.   Viviann Pastor, MD 11/08/23 [phone]

## 2023-11-08 NOTE — ED Provider Notes (Signed)
 Care of this patient assumed from prior physician at 0700 pending labs, continued observation, and disposition. Please see prior physician note for further details.   Briefly, this is a 18 year old female with history of sickle cell anemia and insomnia who presented with insomnia last night and subtherapeutic use of over-the-counter doxylamine, estimated 5 to 10 tablets of 25 mg.  Denies suicidal intent.  Pills taken over multiple doses between 10 PM and 5 AM.  Presented with grogginess.  Reassuring EKG.  Signed out to me pending labs, plan for hour observation.  If patient back at baseline and reassuring workup, plans for discharge.  10:25 AM Patient remained without new complaints during initial hours of observation.  Labs resulted with leukocytosis at 19, slightly increased from prior.  Stable anemia.  Transaminitis noted with elevated bilirubin, though total bilirubin has previously been elevated.  I went to reevaluate the patient to update her on results of workup and reassess her.  Patient was unfortunately no longer in her room.  Spoke with the nurse who notes that patient left without informing staff.  Unable to complete my assessment on this patient.   Levander Slate, MD 11/08/23 1028

## 2023-11-08 NOTE — ED Triage Notes (Signed)
 Pt states she has hx insomnia and thinks she took too many sleeping pills last night, pt reports she took equate sleep aid unknown amount, aprox 5. Pt states now she feels very sleepy.

## 2023-11-08 NOTE — ED Notes (Signed)
 Per EDP, Pt does not need Poison control at this time.

## 2023-11-08 NOTE — ED Notes (Signed)
 Lab called to send phlebotomy to collect labs due to difficult collection. Rn spoke with [place] in the lab.

## 2023-11-08 NOTE — ED Notes (Signed)
 RN to bedside. Pt and family member not in room. No personal belongings left in room. Pt's monitoring equipment removed. Pt did not have IV placed. MD made aware.

## 2023-11-19 ENCOUNTER — Other Ambulatory Visit: Payer: Self-pay

## 2023-11-19 DIAGNOSIS — D5700 Hb-SS disease with crisis, unspecified: Principal | ICD-10-CM | POA: Diagnosis present

## 2023-11-19 DIAGNOSIS — Z9049 Acquired absence of other specified parts of digestive tract: Secondary | ICD-10-CM | POA: Diagnosis not present

## 2023-11-19 DIAGNOSIS — R300 Dysuria: Secondary | ICD-10-CM | POA: Diagnosis present

## 2023-11-19 LAB — BASIC METABOLIC PANEL WITH GFR
Anion gap: 7 (ref 5–15)
BUN: 10 mg/dL (ref 6–20)
CO2: 22 mmol/L (ref 22–32)
Calcium: 9.6 mg/dL (ref 8.9–10.3)
Chloride: 110 mmol/L (ref 98–111)
Creatinine, Ser: 0.49 mg/dL (ref 0.44–1.00)
GFR, Estimated: >60 mL/min (ref >60)
Glucose, Bld: 101 mg/dL — ABNORMAL HIGH (ref 70–99)
Potassium: 3.6 mmol/L (ref 3.5–5.1)
Sodium: 139 mmol/L (ref 135–145)

## 2023-11-19 LAB — CBC WITH DIFFERENTIAL/PLATELET
Abs Immature Granulocytes: 0.03 K/uL (ref 0.00–0.07)
Basophils Absolute: 0.2 K/uL — ABNORMAL HIGH (ref 0.0–0.1)
Basophils Relative: 1 %
Eosinophils Absolute: 0.5 K/uL (ref 0.0–0.5)
Eosinophils Relative: 4 %
HCT: 23.8 % — ABNORMAL LOW (ref 36.0–46.0)
Hemoglobin: 8.2 g/dL — ABNORMAL LOW (ref 12.0–15.0)
Immature Granulocytes: 0 %
Lymphocytes Relative: 34 %
Lymphs Abs: 4.5 K/uL — ABNORMAL HIGH (ref 0.7–4.0)
MCH: 34 pg (ref 26.0–34.0)
MCHC: 34.5 g/dL (ref 30.0–36.0)
MCV: 98.8 fL (ref 80.0–100.0)
Monocytes Absolute: 2 K/uL — ABNORMAL HIGH (ref 0.1–1.0)
Monocytes Relative: 15 %
Neutro Abs: 6.1 K/uL (ref 1.7–7.7)
Neutrophils Relative %: 46 %
Platelets: 422 K/uL — ABNORMAL HIGH (ref 150–400)
RBC: 2.41 MIL/uL — ABNORMAL LOW (ref 3.87–5.11)
RDW: 18.8 % — ABNORMAL HIGH (ref 11.5–15.5)
WBC: 13.3 K/uL — ABNORMAL HIGH (ref 4.0–10.5)
nRBC: 1.7 % — ABNORMAL HIGH (ref 0.0–0.2)

## 2023-11-19 LAB — RETICULOCYTES
Immature Retic Fract: 39.6 % — ABNORMAL HIGH (ref 2.3–15.9)
RBC.: 2.43 MIL/uL — ABNORMAL LOW (ref 3.87–5.11)
Retic Count, Absolute: 360.9 K/uL — ABNORMAL HIGH (ref 19.0–186.0)
Retic Ct Pct: 14.9 % — ABNORMAL HIGH (ref 0.4–3.1)

## 2023-11-19 NOTE — ED Triage Notes (Signed)
 Pt reports pain in her back and all 4 extremities x 2 days that is indicative of sickle cell crisis for her.

## 2023-11-20 ENCOUNTER — Emergency Department
Admission: EM | Admit: 2023-11-20 | Discharge: 2023-11-20 | DRG: 812 | Disposition: A | Discharge status: Other Acute Inpatient Hospital | Attending: Internal Medicine | Admitting: Internal Medicine

## 2023-11-20 ENCOUNTER — Inpatient Hospital Stay (HOSPITAL_COMMUNITY)
Admission: EM | Admit: 2023-11-20 | Discharge: 2023-11-24 | DRG: 812 | Disposition: A | Discharge status: Home / Self Care | Source: Ambulatory Visit | Attending: Internal Medicine | Admitting: Internal Medicine

## 2023-11-20 ENCOUNTER — Encounter: Payer: Self-pay | Admitting: Nurse Practitioner

## 2023-11-20 ENCOUNTER — Inpatient Hospital Stay: Payer: Self-pay | Admitting: Nurse Practitioner

## 2023-11-20 ENCOUNTER — Emergency Department

## 2023-11-20 ENCOUNTER — Encounter (HOSPITAL_COMMUNITY): Payer: Self-pay

## 2023-11-20 DIAGNOSIS — G894 Chronic pain syndrome: Secondary | ICD-10-CM | POA: Diagnosis present

## 2023-11-20 DIAGNOSIS — Z885 Allergy status to narcotic agent status: Secondary | ICD-10-CM | POA: Diagnosis not present

## 2023-11-20 DIAGNOSIS — Z8673 Personal history of transient ischemic attack (TIA), and cerebral infarction without residual deficits: Secondary | ICD-10-CM | POA: Diagnosis not present

## 2023-11-20 DIAGNOSIS — D72829 Elevated white blood cell count, unspecified: Secondary | ICD-10-CM | POA: Diagnosis present

## 2023-11-20 DIAGNOSIS — Z7951 Long term (current) use of inhaled steroids: Secondary | ICD-10-CM | POA: Diagnosis not present

## 2023-11-20 DIAGNOSIS — D5700 Hb-SS disease with crisis, unspecified: Principal | ICD-10-CM | POA: Diagnosis present

## 2023-11-20 DIAGNOSIS — Z743 Need for continuous supervision: Secondary | ICD-10-CM | POA: Diagnosis not present

## 2023-11-20 DIAGNOSIS — F411 Generalized anxiety disorder: Secondary | ICD-10-CM | POA: Diagnosis present

## 2023-11-20 DIAGNOSIS — D638 Anemia in other chronic diseases classified elsewhere: Secondary | ICD-10-CM | POA: Diagnosis present

## 2023-11-20 DIAGNOSIS — J4520 Mild intermittent asthma, uncomplicated: Secondary | ICD-10-CM | POA: Diagnosis present

## 2023-11-20 DIAGNOSIS — Z79899 Other long term (current) drug therapy: Secondary | ICD-10-CM | POA: Diagnosis not present

## 2023-11-20 DIAGNOSIS — I959 Hypotension, unspecified: Secondary | ICD-10-CM | POA: Diagnosis not present

## 2023-11-20 DIAGNOSIS — M546 Pain in thoracic spine: Secondary | ICD-10-CM | POA: Diagnosis not present

## 2023-11-20 LAB — URINALYSIS, W/ REFLEX TO CULTURE (INFECTION SUSPECTED)
Bacteria, UA: NONE SEEN
Bilirubin Urine: NEGATIVE
Glucose, UA: NEGATIVE mg/dL
Hgb urine dipstick: NEGATIVE
Ketones, ur: NEGATIVE mg/dL
Leukocytes,Ua: NEGATIVE
Nitrite: NEGATIVE
Protein, ur: NEGATIVE mg/dL
RBC / HPF: 0 RBC/hpf (ref 0–5)
Specific Gravity, Urine: 1.01 (ref 1.005–1.030)
pH: 6 (ref 5.0–8.0)

## 2023-11-20 MED ORDER — HYDROMORPHONE HCL 1 MG/ML IJ SOLN: 1.0000 mg | INTRAMUSCULAR | Status: DC

## 2023-11-20 MED ORDER — DIPHENHYDRAMINE HCL 25 MG PO CAPS
25.0000 mg | ORAL_CAPSULE | ORAL | Status: DC | PRN
  Administered 2023-11-23: 25 mg via ORAL
  Filled 2023-11-20: qty 1

## 2023-11-20 MED ORDER — HYDROMORPHONE HCL 1 MG/ML IJ SOLN
0.5000 mg | INTRAMUSCULAR | Status: AC
  Administered 2023-11-20: 0.5 mg via INTRAVENOUS
  Filled 2023-11-20: qty 0.5

## 2023-11-20 MED ORDER — SODIUM CHLORIDE 0.45 % IV SOLN: INTRAVENOUS | Status: DC

## 2023-11-20 MED ORDER — HYDROMORPHONE HCL 1 MG/ML IJ SOLN
2.0000 mg | INTRAMUSCULAR | Status: AC
  Administered 2023-11-20: 2 mg via INTRAVENOUS
  Filled 2023-11-20: qty 2

## 2023-11-20 MED ORDER — KETOROLAC TROMETHAMINE 15 MG/ML IJ SOLN
15.0000 mg | INTRAMUSCULAR | Status: AC
  Administered 2023-11-20: 15 mg via INTRAVENOUS
  Filled 2023-11-20: qty 1

## 2023-11-20 MED ORDER — POLYETHYLENE GLYCOL 3350 17 G PO PACK
17.0000 g | PACK | Freq: Every day | ORAL | Status: DC | PRN
  Filled 2023-11-20: qty 1

## 2023-11-20 MED ORDER — HYDROMORPHONE 1 MG/ML IV SOLN
INTRAVENOUS | Status: DC
  Administered 2023-11-20: 6.5 mg via INTRAVENOUS
  Administered 2023-11-20: 11.5 mg via INTRAVENOUS
  Administered 2023-11-20 – 2023-11-21 (×2): 30 mg via INTRAVENOUS
  Administered 2023-11-21: 7.4 mg via INTRAVENOUS
  Administered 2023-11-21: 11.5 mg via INTRAVENOUS
  Administered 2023-11-21: 7.5 mg via INTRAVENOUS
  Administered 2023-11-21: 9.5 mg via INTRAVENOUS
  Administered 2023-11-21: 30 mg via INTRAVENOUS
  Administered 2023-11-22: 11 mg via INTRAVENOUS
  Administered 2023-11-22: 11.9 mg via INTRAVENOUS
  Administered 2023-11-22: 5 mg via INTRAVENOUS
  Administered 2023-11-23: 30 mg via INTRAVENOUS
  Administered 2023-11-23: 6 mg via INTRAVENOUS
  Administered 2023-11-23: 30 mg via INTRAVENOUS
  Administered 2023-11-23: 7 mg via INTRAVENOUS
  Administered 2023-11-23: 12 mg via INTRAVENOUS
  Administered 2023-11-24: 5 mg via INTRAVENOUS
  Administered 2023-11-24: 11 mg via INTRAVENOUS
  Administered 2023-11-24: 6 mg via INTRAVENOUS
  Filled 2023-11-20 (×6): qty 30

## 2023-11-20 MED ORDER — ONDANSETRON HCL 4 MG/2ML IJ SOLN
4.0000 mg | Freq: Four times a day (QID) | INTRAMUSCULAR | Status: DC | PRN
  Administered 2023-11-21 – 2023-11-24 (×6): 4 mg via INTRAVENOUS
  Filled 2023-11-20 (×6): qty 2

## 2023-11-20 MED ORDER — DIPHENHYDRAMINE HCL 50 MG/ML IJ SOLN
25.0000 mg | INTRAMUSCULAR | Status: AC
  Administered 2023-11-20: 25 mg via INTRAVENOUS
  Filled 2023-11-20: qty 1

## 2023-11-20 MED ORDER — HYDROMORPHONE HCL 1 MG/ML IJ SOLN
1.0000 mg | Freq: Once | INTRAMUSCULAR | Status: AC
  Administered 2023-11-20: 1 mg via INTRAVENOUS
  Filled 2023-11-20: qty 1

## 2023-11-20 MED ORDER — SODIUM CHLORIDE 0.9 % IV SOLN
12.5000 mg | Freq: Once | INTRAVENOUS | Status: AC
  Administered 2023-11-20: 12.5 mg via INTRAVENOUS
  Filled 2023-11-20: qty 0.5
  Filled 2023-11-20: qty 12.5

## 2023-11-20 MED ORDER — HYDROMORPHONE HCL 1 MG/ML IJ SOLN
1.0000 mg | INTRAMUSCULAR | Status: AC
  Administered 2023-11-20: 1 mg via INTRAVENOUS
  Filled 2023-11-20: qty 1

## 2023-11-20 MED ORDER — SODIUM CHLORIDE 0.9% FLUSH: 9.0000 mL | INTRAVENOUS | Status: DC | PRN

## 2023-11-20 MED ORDER — HYDROMORPHONE HCL 1 MG/ML IJ SOLN
2.0000 mg | Freq: Once | INTRAMUSCULAR | Status: AC
  Administered 2023-11-20: 2 mg via INTRAVENOUS
  Filled 2023-11-20: qty 2

## 2023-11-20 MED ORDER — SENNOSIDES-DOCUSATE SODIUM 8.6-50 MG PO TABS
1.0000 | ORAL_TABLET | Freq: Two times a day (BID) | ORAL | Status: DC
  Administered 2023-11-20 – 2023-11-24 (×8): 1 via ORAL
  Filled 2023-11-20 (×8): qty 1

## 2023-11-20 MED ORDER — ONDANSETRON HCL 4 MG/2ML IJ SOLN
4.0000 mg | INTRAMUSCULAR | Status: DC | PRN
  Administered 2023-11-20: 4 mg via INTRAVENOUS
  Filled 2023-11-20: qty 2

## 2023-11-20 MED ORDER — NALOXONE HCL 0.4 MG/ML IJ SOLN: 0.4000 mg | INTRAMUSCULAR | Status: DC | PRN

## 2023-11-20 NOTE — ED Notes (Signed)
 [facility] at capacity,  [employer] spoke to [place] for transfer  1114

## 2023-11-20 NOTE — H&P (Deleted)
   The note originally documented on this encounter has been moved the the encounter in which it belongs.

## 2023-11-20 NOTE — ED Notes (Signed)
Images powershared to [facility] 

## 2023-11-20 NOTE — Plan of Care (Signed)
  Problem: Education: Goal: Knowledge of General Education information will improve Description: Including pain rating scale, medication(s)/side effects and non-pharmacologic comfort measures Outcome: Progressing   Problem: Health Behavior/Discharge Planning: Goal: Ability to manage health-related needs will improve Outcome: Progressing   Problem: Clinical Measurements: Goal: Ability to maintain clinical measurements within normal limits will improve Outcome: Progressing Goal: Will remain free from infection Outcome: Progressing Goal: Diagnostic test results will improve Outcome: Progressing Goal: Respiratory complications will improve Outcome: Progressing Goal: Cardiovascular complication will be avoided Outcome: Progressing   Problem: Activity: Goal: Risk for activity intolerance will decrease Outcome: Progressing   Problem: Nutrition: Goal: Adequate nutrition will be maintained Outcome: Progressing   Problem: Coping: Goal: Level of anxiety will decrease Outcome: Progressing   Problem: Elimination: Goal: Will not experience complications related to bowel motility Outcome: Progressing Goal: Will not experience complications related to urinary retention Outcome: Progressing   Problem: Pain Managment: Goal: General experience of comfort will improve and/or be controlled Outcome: Progressing   Problem: Skin Integrity: Goal: Risk for impaired skin integrity will decrease Outcome: Progressing   Problem: Safety: Goal: Ability to remain free from injury will improve Outcome: Progressing   Problem: Education: Goal: Knowledge of vaso-occlusive preventative measures will improve Outcome: Progressing Goal: Awareness of infection prevention will improve Outcome: Progressing Goal: Awareness of signs and symptoms of anemia will improve Outcome: Progressing Goal: Long-term complications will improve Outcome: Progressing   Problem: Self-Care: Goal: Ability to  incorporate actions that prevent/reduce pain crisis will improve Outcome: Progressing   Problem: Bowel/Gastric: Goal: Gut motility will be maintained Outcome: Progressing   Problem: Fluid Volume: Goal: Ability to maintain a balanced intake and output will improve Outcome: Progressing   Problem: Health Behavior: Goal: Postive changes in compliance with treatment and prescription regimens will improve Outcome: Progressing

## 2023-11-20 NOTE — H&P (Signed)
 H&P  Patient Demographics:  Bailey Reese, is a 18 y.o. female  MRN: [id number]   DOB - Jul 25, 2005  Admit Date - 11/20/2023  Outpatient Primary MD for the patient is Little, Slater Pizza, MD  Chief Complaint  Patient presents with   Sickle Cell Pain Crisis      HPI:   Bailey Reese  is a 18 y.o. female with history of sickle cell disease, PTSD, depression with anxiety who usually gets her care at [facility] but was seen at [facility] for abdominal pain , Nausea and emesis not typical of her sickle cell pain.  Patient was managed in the ER for crisis however her pain was not controlled. Patient was transferred to our care for continued sickle cell crisis pain control.  She has vitals that are stable.  Hemoglobin is around 8.2 g/dl.  She was getting IV Dilaudid  pushes in the ER.  Patient denied diarrhea.  Denied any viral illness, cough, SOB. No urinary symptoms. She has had previous osteomyelitis and multiple episodes of acute chest syndromes.  Also hemorrhagic stroke in 2017.  Patient has chronic pain from her sickle cell disease.  No recent travels or sick contacts.   ED Course:  Patient was treated in the ED with IVF, IV pain medication with no resolution to symptoms. Chest X-ray is negative. Patient admitted for ongoing sickle cell pain management.  BP: 124/71  Pulse: 101  Resp: 16  Temp: 99.4 F (37.4C)  SpO2: 100%    WBC 13.3 (*)       RBC 2.41 (*)      Hemoglobin 8.2 (*)      HCT 23.8 (*)      RDW 18.8 (*)      Platelets 422 (*)      nRBC 1.7 (*)      Lymphs Abs 4.5 (*)      Monocytes Absolute 2.0 (*)      Basophils Absolute 0.2 (*)      All other components within normal limits  BASIC METABOLIC PANEL WITH GFR - Abnormal; Notable for the following components:    Glucose, Bld 101 (*)      All other components within normal limits  RETICULOCYTES - Abnormal; Notable for the following components:    Retic Ct Pct 14.9 (*)      RBC. 2.43 (*)      Retic Count,  Absolute 360.9 (*)      Immature Retic Fract 39.6 (*)      All other components within normal limits  URINALYSIS, W/ REFLEX TO CULTURE (INFECTION SUSPECTED) - Abnormal; Notable for the following components:    Color, Urine YELLOW (*)      APPearance HAZY (*)      All other components within normal limits  POC URINE PREG, ED       Review of systems:  In addition to the HPI above, patient reports No fever or chills No Headache, No changes with vision or hearing No problems swallowing food or liquids No chest pain, cough or shortness of breath No abdominal pain, No nausea or vomiting, Bowel movements are regular No blood in stool or urine No dysuria No new skin rashes or bruises No new joints pains-aches No new weakness, tingling, numbness in any extremity No recent weight gain or loss No polyuria, polydypsia or polyphagia No significant Mental Stressors  A full 10 point Review of Systems was done, except as stated above, all other Review of Systems were negative.  With Past History of the following :   Past Medical History:  Diagnosis Date   Anxiety    Depression    PTSD (post-traumatic stress disorder)    Sickle cell anemia (HCC)       Past Surgical History:  Procedure Laterality Date   BIOPSY  07/04/2023   Procedure: BIOPSY;  Surgeon: Saintclair Jasper, MD;  Location: [facility];  Service: Gastroenterology;;   CHOLECYSTECTOMY     ESOPHAGOGASTRODUODENOSCOPY (EGD) WITH PROPOFOL  N/A 07/04/2023   Procedure: ESOPHAGOGASTRODUODENOSCOPY (EGD) WITH PROPOFOL ;  Surgeon: Saintclair Jasper, MD;  Location: [facility];  Service: Gastroenterology;  Laterality: N/A;   WRIST SURGERY       Social History:   Social History   Tobacco Use   Smoking status: Never   Smokeless tobacco: Never  Substance Use Topics   Alcohol  use: No     Lives - At home   Family History :   Family History  Problem Relation Age of Onset   Diabetes Paternal Aunt      Home Medications:   Prior to  Admission medications   Medication Sig Start Date End Date Taking? Authorizing Provider  budesonide-formoterol  (SYMBICORT) 80-4.5 MCG/ACT inhaler Inhale 2 puffs into the lungs See admin instructions. Inhale 2 puffs by mouth twice daily and an extra puff as needed. 12/29/22 12/29/23 Yes [provider]  buprenorphine  (BUTRANS ) 20 MCG/HR PTWK Place 1 patch onto the skin once a week.   Yes [provider]  Calcium Carb-Cholecalciferol (CALCIUM 600+D3 PO) Take 1 tablet by mouth daily.   Yes [provider]  Deferasirox  360 MG TABS Take 360 mg by mouth 2 (two) times daily.   Yes [provider]  ergocalciferol (VITAMIN D2) 1.25 MG (50000 UT) capsule Take 50,000 Units by mouth once a week.   Yes [provider]  esomeprazole  (NEXIUM ) 40 MG capsule Take 1 capsule (40 mg total) by mouth daily as needed (reflux). 08/29/23  Yes Honest Safranek M, NP  fexofenadine (ALLEGRA) 180 MG tablet Take 180 mg by mouth daily as needed for allergies. 12/29/22  Yes [provider]  fluticasone  (FLONASE ) 50 MCG/ACT nasal spray Place 1 spray into both nostrils daily as needed for allergies or rhinitis. 12/29/22  Yes [provider]  hydroxyurea  (HYDREA ) 500 MG capsule Take 500 mg by mouth 2 (two) times daily. May take with food to minimize GI side effects.   Yes [provider]  hydrOXYzine  (ATARAX ) 25 MG tablet Take 25 mg by mouth See admin instructions. Take 25mg  (1 tablet) by mouth twice daily and an extra tablet if needed.   Yes [provider]  lubiprostone  (AMITIZA ) 8 MCG capsule Take 8 mcg by mouth 2 (two) times daily as needed for constipation. 06/01/23 05/31/24 Yes [provider]  Melaton-Thean-Cham-PassF-LBalm (SLEEP) CAPS Take 1 capsule by mouth at bedtime.   Yes [provider]  ondansetron  (ZOFRAN -ODT) 4 MG disintegrating tablet Take 4 mg by mouth every 8 (eight) hours as needed for nausea or vomiting (dissolve orally).    Yes [provider]  Oxycodone  HCl 10 MG TABS Take 1 tablet (10 mg total) by mouth every 6 (six) hours as needed (pain). 07/05/23  Yes Sim Emery CROME, MD  pantoprazole  (PROTONIX ) 40 MG tablet Take 1 tablet (40 mg total) by mouth 2 (two) times daily. 07/05/23 07/04/24 Yes Sim Emery CROME, MD  prazosin  (MINIPRESS ) 1 MG capsule Take 1 mg by mouth at bedtime.   Yes [provider]  pregabalin  (LYRICA ) 150 MG  capsule Take 150 mg by mouth 3 (three) times daily.   Yes [provider]  prochlorperazine  (COMPAZINE ) 5 MG tablet Take 5 mg by mouth every 6 (six) hours as needed for nausea or vomiting.   Yes [provider]  promethazine  (PHENERGAN ) 12.5 MG tablet Take 12.5 mg by mouth every 6 (six) hours as needed for nausea or vomiting. 05/10/23  Yes [provider]     Allergies:   Allergies  Allergen Reactions   Cherry Anaphylaxis, Itching and Swelling   Olanzapine  Other (See Comments)    Drug-induced liver injury   Other Swelling and Other (See Comments)    Pitted fruits   Peanut-Containing Drug Products Anaphylaxis, Itching and Swelling   Plum Pulp Anaphylaxis and Hives   Droperidol  Other (See Comments)    Pt states muscle tension, involuntary movements, eye drift, anxiety   Morphine  And Codeine Hives and Itching   Peach [Prunus Persica] Itching, Swelling and Other (See Comments)   Acetaminophen  Nausea And Vomiting   Compazine  [Prochlorperazine ] Other (See Comments) and Hypertension    Patient stated that this medication causes her HR to increase and well as BP. She also states that it cause hallucinations.   Ibuprofen  Nausea And Vomiting     Physical Exam:   Vitals:   Vitals:   11/20/23 1200 11/20/23 1216  BP: 112/75 112/75  Pulse: 75 88  Resp: 13 18  Temp:  98.4 F (36.9 C)  SpO2: 100% 100%    Physical Exam: Constitutional: Patient appears well-developed and well-nourished. Not in obvious distress. HENT: Normocephalic,  atraumatic, External right and left ear normal. Oropharynx is clear and moist.  Eyes: Conjunctivae and EOM are normal. PERRLA, no scleral icterus. Neck: Normal ROM. Neck supple. No JVD. No tracheal deviation. No thyromegaly. CVS: RRR, S1/S2 +, no murmurs, no gallops, no carotid bruit.  Pulmonary: Effort and breath sounds normal, no stridor, rhonchi, wheezes, rales.  Abdominal: tenderness Musculoskeletal: generalize body tenderness Lymphadenopathy: No lymphadenopathy noted, cervical, inguinal or axillary Neuro: Alert. Normal reflexes, muscle tone coordination. No cranial nerve deficit. Skin: Skin is warm and dry. No rash noted. Not diaphoretic. No erythema. No pallor. Psychiatric: Normal mood and affect. Behavior, judgment, thought content normal.   Data Review:   CBC Recent Labs  Lab 11/19/23 2315  WBC 13.3*  HGB 8.2*  HCT 23.8*  PLT 422*  MCV 98.8  MCH 34.0  MCHC 34.5  RDW 18.8*  LYMPHSABS 4.5*  MONOABS 2.0*  EOSABS 0.5  BASOSABS 0.2*   ------------------------------------------------------------------------------------------------------------------  Chemistries  Recent Labs  Lab 11/19/23 2315  NA 139  K 3.6  CL 110  CO2 22  GLUCOSE 101*  BUN 10  CREATININE 0.49  CALCIUM 9.6   ------------------------------------------------------------------------------------------------------------------ estimated creatinine clearance is 105.5 mL/min (by C-G formula based on SCr of 0.49 mg/dL). ------------------------------------------------------------------------------------------------------------------ No results for input(s): TSH, T4TOTAL, T3FREE, THYROIDAB in the last 72 hours.  Invalid input(s): FREET3  Coagulation profile No results for input(s): INR, PROTIME in the last 168 hours. ------------------------------------------------------------------------------------------------------------------- No results for input(s): DDIMER in the last 72  hours. -------------------------------------------------------------------------------------------------------------------  Cardiac Enzymes No results for input(s): CKMB, TROPONINI, MYOGLOBIN in the last 168 hours.  Invalid input(s): CK ------------------------------------------------------------------------------------------------------------------ No results found for: BNP  ---------------------------------------------------------------------------------------------------------------  Urinalysis    Component Value Date/Time   COLORURINE YELLOW (A) 11/20/2023 0601   APPEARANCEUR HAZY (A) 11/20/2023 0601   APPEARANCEUR Clear 05/01/2013 1440   LABSPEC 1.010 11/20/2023 0601   LABSPEC 1.016 05/01/2013 1440   PHURINE 6.0 11/20/2023 0601  GLUCOSEU NEGATIVE 11/20/2023 0601   GLUCOSEU Negative 05/01/2013 1440   HGBUR NEGATIVE 11/20/2023 0601   BILIRUBINUR NEGATIVE 11/20/2023 0601   BILIRUBINUR Negative 05/01/2013 1440   KETONESUR NEGATIVE 11/20/2023 0601   PROTEINUR NEGATIVE 11/20/2023 0601   NITRITE NEGATIVE 11/20/2023 0601   LEUKOCYTESUR NEGATIVE 11/20/2023 0601   LEUKOCYTESUR Negative 05/01/2013 1440    ----------------------------------------------------------------------------------------------------------------   Imaging Results:    DG Chest Portable 1 View Result Date: 11/20/2023 CLINICAL DATA:  Chest pain. Back pain and extremity pain for 2 days. Possible sickle cell crisis. EXAM: PORTABLE CHEST 1 VIEW COMPARISON:  10/29/2023 FINDINGS: Shallow inspiration. Heart size and pulmonary vascularity are normal. Lungs are clear. No pleural effusion or pneumothorax. Mediastinal contours appear intact. Visualized bones appear intact. IMPRESSION: No active disease. Electronically Signed   By: Elsie Gravely M.D.   On: 11/20/2023 04:12     Assessment & Plan:  Principal Problem:   Sickle cell anemia with pain (HCC) Active Problems:   Chronic pain syndrome    Leucocytosis   GAD (generalized anxiety disorder)   Hb Sickle Cell Disease with crisis: Admit patient, start IVF 0.45% Saline @ 125 mls/hour, start weight based Dilaudid  PCA, start IV Toradol  15 mg Q 6 H, Restart oral home pain medications, Monitor vitals very closely, Re-evaluate pain scale regularly, 2 L of Oxygen by [place], Patient will be re-evaluated for pain in the context of function and relationship to baseline as care progresses. Leukocytosis: Elevated however no acute S/S of infection.  Anemia of Chronic Disease: slightly below patients baseline, no transfusion needed at this time, will continue to monitor daily CBC.  Chronic pain Syndrome: continue oral home medication  GAD: stable, continue medication as prescribed.    DVT Prophylaxis: Subcut Lovenox    AM Labs Ordered, also please review Full Orders  Family Communication: Admission, patient's condition and plan of care including tests being ordered have been discussed with the patient who indicate understanding and agree with the plan and Code Status.  Code Status: Full Code  Consults called: None    Admission status: Inpatient    Time spent in minutes : 50 minutes  [name] Cover NP  11/20/2023 at 2:10 PM

## 2023-11-20 NOTE — ED Provider Notes (Signed)
 [facility] Provider Note    Event Date/Time   First MD Initiated Contact with Patient 11/20/23 [phone]     (approximate)   History   Chief Complaint: Sickle Cell Pain Crisis   HPI  Bailey Reese is a 18 y.o. female with a history of sickle cell disease, hemoglobin SS followed by [facility] hematology who comes ED complaining of vomiting and poor oral intake for the last 2 days associated with generalized pain.  She reports this is typical of his sickle cell pain crisis for her.  Not able to tolerate medications at home.  Denies fever diarrhea or shortness of breath.  Does endorse chest pain which is typical as well.  Patient endorses mild dysuria     Past Medical History:  Diagnosis Date   Anxiety    Depression    PTSD (post-traumatic stress disorder)    Sickle cell anemia [facility])     Current Outpatient Rx   Order #: [id number] Class: Historical Med   Order #: [id number] Class: Historical Med   Order #: [id number] Class: Historical Med   Order #: [id number] Class: Historical Med   Order #: [id number] Class: Historical Med   Order #: [id number] Class: Normal   Order #: [id number] Class: Historical Med   Order #: [id number] Class: Historical Med   Order #: [id number] Class: Historical Med   Order #: [id number] Class: Historical Med   Order #: [id number] Class: Historical Med   Order #: [id number] Class: Historical Med   Order #: [id number] Class: Historical Med   Order #: [id number] Class: Normal   Order #: [id number] Class: Normal   Order #: [id number] Class: Historical Med   Order #: [id number] Class: Historical Med   Order #: [id number] Class: Historical Med   Order #: [id number] Class: Historical Med    Past Surgical History:  Procedure Laterality Date   BIOPSY  07/04/2023   Procedure: BIOPSY;  Surgeon: Saintclair Jasper, MD;  Location: [facility];  Service: Gastroenterology;;   CHOLECYSTECTOMY     ESOPHAGOGASTRODUODENOSCOPY (EGD) WITH PROPOFOL  N/A 07/04/2023   Procedure:  ESOPHAGOGASTRODUODENOSCOPY (EGD) WITH PROPOFOL ;  Surgeon: Saintclair Jasper, MD;  Location: [facility];  Service: Gastroenterology;  Laterality: N/A;   WRIST SURGERY      Physical Exam   Triage Vital Signs: ED Triage Vitals  Encounter Vitals Group     BP 11/19/23 2301 124/71     Girls Systolic BP Percentile --      Girls Diastolic BP Percentile --      Boys Systolic BP Percentile --      Boys Diastolic BP Percentile --      Pulse Rate 11/19/23 2301 (!) 101     Resp 11/19/23 2301 16     Temp 11/19/23 2301 99.4 F (37.4 C)     Temp Source 11/19/23 2301 Oral     SpO2 11/19/23 2301 100 %     Weight 11/19/23 2302 145 lb (65.8 kg)     Height 11/19/23 2302 5' 4 (1.626 m)     Head Circumference --      Peak Flow --      Pain Score 11/19/23 2301 9     Pain Loc --      Pain Education --      Exclude from Growth Chart --     Most recent vital signs: Vitals:   11/19/23 2301 11/19/23 2304  BP: 124/71   Pulse: (!) 101   Resp: 16   Temp: 99.4 F (37.4 C)   SpO2: 100% 98%    General: Awake, no distress.  CV:  Good peripheral perfusion.  Regular rate rhythm Resp:  Normal effort.  Clear to auscultation bilaterally Abd:  No distention.  Soft with mild left lower quadrant tenderness Other:     ED Results / Procedures / Treatments   Labs (all labs ordered are listed, but only abnormal results are displayed) Labs Reviewed  CBC WITH DIFFERENTIAL/PLATELET - Abnormal; Notable for the following components:      Result Value   WBC 13.3 (*)    RBC 2.41 (*)    Hemoglobin 8.2 (*)    HCT 23.8 (*)    RDW 18.8 (*)    Platelets 422 (*)    nRBC 1.7 (*)    Lymphs Abs 4.5 (*)    Monocytes Absolute 2.0 (*)    Basophils Absolute 0.2 (*)    All other components within normal limits  BASIC METABOLIC PANEL WITH GFR - Abnormal; Notable for the following components:   Glucose, Bld 101 (*)    All other components within normal limits  RETICULOCYTES - Abnormal; Notable for the following  components:   Retic Ct Pct 14.9 (*)    RBC. 2.43 (*)    Retic Count, Absolute 360.9 (*)    Immature Retic Fract 39.6 (*)    All other components within normal limits  URINALYSIS, W/ REFLEX TO CULTURE (INFECTION SUSPECTED) - Abnormal; Notable for the following components:   Color, Urine YELLOW (*)    APPearance HAZY (*)    All other components within normal limits  POC URINE PREG, ED     EKG    RADIOLOGY Chest x-ray interpreted by me, unremarkable.  Radiology report reviewed   PROCEDURES:  .Critical Care  Performed by: Viviann Pastor, MD Authorized by: Viviann Pastor, MD   Critical care provider statement:    Critical care time (minutes):  35   Critical care time was exclusive of:  Separately billable procedures and treating other patients   Critical care was necessary to treat or prevent imminent or life-threatening deterioration of the following conditions:  Circulatory failure   Critical care was time spent personally by me on the following activities:  Development of treatment plan with patient or surrogate, discussions with consultants, evaluation of patient's response to treatment, examination of patient, obtaining history from patient or surrogate, ordering and performing treatments and interventions, ordering and review of laboratory studies, ordering and review of radiographic studies, pulse oximetry, re-evaluation of patient's condition and review of old charts    MEDICATIONS ORDERED IN ED: Medications  0.45 % sodium chloride  infusion ( Intravenous New Bag/Given 11/20/23 0440)  ondansetron  (ZOFRAN ) injection 4 mg (4 mg Intravenous Given 11/20/23 0603)  HYDROmorphone  (DILAUDID ) injection 2 mg (has no administration in time range)  diphenhydrAMINE  (BENADRYL ) injection 25 mg (has no administration in time range)  ketorolac  (TORADOL ) 15 MG/ML injection 15 mg (15 mg Intravenous Given 11/20/23 0434)  HYDROmorphone  (DILAUDID ) injection 0.5 mg (0.5 mg Intravenous Given  11/20/23 0433)  HYDROmorphone  (DILAUDID ) injection 1 mg (1 mg Intravenous Given 11/20/23 0512)  HYDROmorphone  (DILAUDID ) injection 2 mg (2 mg Intravenous Given 11/20/23 0559)     IMPRESSION / MDM / ASSESSMENT AND PLAN / ED COURSE  I reviewed the triage vital signs and the nursing notes.  DDx: Dehydration, viral syndrome, sickle cell pain crisis, anemia, electrolyte derangement, UTI, pregnancy, pneumonia  Patient's presentation is most consistent with acute presentation with potential threat to life or bodily function.  Patient presents with generalized pain along with vomiting for the last 2 days.  Give IV  fluids, IV Dilaudid  and Toradol  for pain relief.  Labs are all unremarkable, baseline anemia.  Doubt acute chest or PE.   Clinical Course as of 11/20/23 0655  Mon Nov 20, 2023  0650 Still in severe pain. Will give additional dilaudid  2mg  IV. If no sig. Improvement, may need to hospitalize for intractable pain. [PS]    Clinical Course User Index [PS] Viviann Pastor, MD     FINAL CLINICAL IMPRESSION(S) / ED DIAGNOSES   Final diagnoses:  Sickle cell pain crisis (HCC)     Rx / DC Orders   ED Discharge Orders     None        Note:  This document was prepared using Dragon voice recognition software and may include unintentional dictation errors.   Viviann Pastor, MD 11/20/23 [phone]

## 2023-11-20 NOTE — ED Notes (Signed)
 Transfer consent signed at this time and witnessed by this RN

## 2023-11-20 NOTE — Progress Notes (Signed)
 [employer] Peds CCM Inpatient & ED 11/20/23 (Monday) Rec'd adt via email Recent hospital discharge Location: [facility].Discharge date: 11/20/2023.

## 2023-11-20 NOTE — ED Notes (Signed)
 Called [facility] transfer center spoke to [place] for transfer  [phone]

## 2023-11-20 NOTE — ED Notes (Signed)
 Accepted to [facility]   waiting for bed assignment

## 2023-11-21 ENCOUNTER — Encounter (HOSPITAL_COMMUNITY): Payer: Self-pay | Admitting: Internal Medicine

## 2023-11-21 DIAGNOSIS — D638 Anemia in other chronic diseases classified elsewhere: Secondary | ICD-10-CM | POA: Diagnosis present

## 2023-11-21 LAB — CBC
HCT: 22.7 % — ABNORMAL LOW (ref 36.0–46.0)
Hemoglobin: 7.6 g/dL — ABNORMAL LOW (ref 12.0–15.0)
MCH: 32.9 pg (ref 26.0–34.0)
MCHC: 33.5 g/dL (ref 30.0–36.0)
MCV: 98.3 fL (ref 80.0–100.0)
Platelets: 424 K/uL — ABNORMAL HIGH (ref 150–400)
RBC: 2.31 MIL/uL — ABNORMAL LOW (ref 3.87–5.11)
RDW: 18.5 % — ABNORMAL HIGH (ref 11.5–15.5)
WBC: 11.7 K/uL — ABNORMAL HIGH (ref 4.0–10.5)
nRBC: 2.7 % — ABNORMAL HIGH (ref 0.0–0.2)

## 2023-11-21 MED ORDER — FLUTICASONE FUROATE-VILANTEROL 100-25 MCG/ACT IN AEPB
1.0000 | INHALATION_SPRAY | Freq: Every day | RESPIRATORY_TRACT | Status: DC
  Administered 2023-11-22 – 2023-11-24 (×3): 1 via RESPIRATORY_TRACT
  Filled 2023-11-21: qty 28

## 2023-11-21 MED ORDER — OXYCODONE HCL 5 MG PO TABS
10.0000 mg | ORAL_TABLET | Freq: Four times a day (QID) | ORAL | Status: DC | PRN
  Administered 2023-11-21 – 2023-11-24 (×7): 10 mg via ORAL
  Filled 2023-11-21 (×7): qty 2

## 2023-11-21 MED ORDER — BUPRENORPHINE 20 MCG/HR TD PTWK
1.0000 | MEDICATED_PATCH | TRANSDERMAL | Status: DC
  Filled 2023-11-21: qty 1

## 2023-11-21 MED ORDER — PREGABALIN 75 MG PO CAPS
150.0000 mg | ORAL_CAPSULE | Freq: Three times a day (TID) | ORAL | Status: DC
  Administered 2023-11-21 – 2023-11-24 (×10): 150 mg via ORAL
  Filled 2023-11-21 (×10): qty 2

## 2023-11-21 MED ORDER — ONDANSETRON 4 MG PO TBDP: 4.0000 mg | ORAL_TABLET | Freq: Three times a day (TID) | ORAL | Status: DC | PRN

## 2023-11-21 MED ORDER — PANTOPRAZOLE SODIUM 40 MG PO PACK: 40.0000 mg | PACK | Freq: Every day | ORAL | Status: DC

## 2023-11-21 MED ORDER — OXYCODONE HCL 10 MG PO TABS: 10.0000 mg | ORAL_TABLET | Freq: Four times a day (QID) | ORAL | Status: DC | PRN

## 2023-11-21 MED ORDER — FLUTICASONE PROPIONATE 50 MCG/ACT NA SUSP
1.0000 | Freq: Every day | NASAL | Status: DC | PRN
Start: 2023-11-21 — End: 2023-11-25

## 2023-11-21 MED ORDER — PRAZOSIN HCL 1 MG PO CAPS
1.0000 mg | ORAL_CAPSULE | Freq: Every day | ORAL | Status: DC
  Administered 2023-11-21 – 2023-11-23 (×3): 1 mg via ORAL
  Filled 2023-11-21 (×4): qty 1

## 2023-11-21 MED ORDER — HYDROXYUREA 500 MG PO CAPS
500.0000 mg | ORAL_CAPSULE | Freq: Two times a day (BID) | ORAL | Status: DC
  Administered 2023-11-21 – 2023-11-24 (×6): 500 mg via ORAL
  Filled 2023-11-21 (×7): qty 1

## 2023-11-21 MED ORDER — LUBIPROSTONE 24 MCG PO CAPS
24.0000 ug | ORAL_CAPSULE | Freq: Two times a day (BID) | ORAL | Status: DC
  Administered 2023-11-22 – 2023-11-24 (×6): 24 ug via ORAL
  Filled 2023-11-21 (×7): qty 1

## 2023-11-21 MED ORDER — HYDROXYZINE HCL 25 MG PO TABS: 25.0000 mg | ORAL_TABLET | ORAL | Status: DC

## 2023-11-21 MED ORDER — DEFERASIROX 360 MG PO TABS
360.0000 mg | ORAL_TABLET | Freq: Two times a day (BID) | ORAL | Status: DC
  Filled 2023-11-21: qty 1

## 2023-11-21 MED ORDER — HYDROXYZINE HCL 25 MG PO TABS
25.0000 mg | ORAL_TABLET | Freq: Two times a day (BID) | ORAL | Status: DC
  Administered 2023-11-21 – 2023-11-24 (×6): 25 mg via ORAL
  Filled 2023-11-21 (×6): qty 1

## 2023-11-21 MED ORDER — PANTOPRAZOLE SODIUM 40 MG PO TBEC
40.0000 mg | DELAYED_RELEASE_TABLET | Freq: Two times a day (BID) | ORAL | Status: DC
  Administered 2023-11-21 – 2023-11-24 (×6): 40 mg via ORAL
  Filled 2023-11-21 (×6): qty 1

## 2023-11-21 MED ORDER — PROMETHAZINE HCL 25 MG PO TABS: 12.5000 mg | ORAL_TABLET | Freq: Four times a day (QID) | ORAL | Status: DC | PRN

## 2023-11-21 NOTE — Progress Notes (Signed)
 Patient ID: Bailey Reese, female   DOB: 16-Sep-2005, 18 y.o.   MRN: [id number] Subjective: Bailey Reese  is a 18 y.o. female with medical history significant of sickle cell disease, PTSD, depression with anxiety who usually gets her care at [facility] but was seen at [facility] ER and admitted to [facility] for sickle cell pain crisis.    Patient endorses pain today of 7/10, she states she still has slight nausea/dry heaves without vomiting however patient did not ask for nausea medication overnight. Advised patient that Zofran  is as needed and she should ask for nausea medication as needed. Denies fever, cough, headache, SOB, no urinary symptoms.   Objective:  Vital signs in last 24 hours:  Vitals:   11/20/23 2305 11/21/23 0336 11/21/23 0352 11/21/23 0817  BP:      Pulse:      Resp: 17 16 17 15   Temp:      TempSrc:      SpO2:      Weight:      Height:        Intake/Output from previous day:   Intake/Output Summary (Last 24 hours) at 11/21/2023 1028 Last data filed at 11/21/2023 0900 Gross per 24 hour  Intake 2753.73 ml  Output --  Net 2753.73 ml    Physical Exam: General: Alert, awake, oriented x3, in no acute distress.  HEENT: [place]/AT [place], EOMI Neck: Trachea midline,  no masses, no thyromegal,y no JVD, no carotid bruit OROPHARYNX:  Moist, No exudate/ erythema/lesions.  Heart: Regular rate and rhythm, without murmurs, rubs, gallops, PMI non-displaced, no heaves or thrills on palpation.  Lungs: Clear to auscultation, no wheezing or rhonchi noted. No increased vocal fremitus resonant to percussion  Abdomen: Soft, nontender, nondistended, positive bowel sounds, no masses no hepatosplenomegaly noted..  Neuro: No focal neurological deficits noted cranial nerves II through XII grossly intact. DTRs 2+ bilaterally upper and lower extremities. Strength 5 out of 5 in bilateral upper and lower extremities. Musculoskeletal: Lower back tenderness Psychiatric: Patient  alert and oriented x3, good insight and cognition, good recent to remote recall. Lymph node survey: No cervical axillary or inguinal lymphadenopathy noted.  Lab Results:  Basic Metabolic Panel:    Component Value Date/Time   NA 139 11/19/2023 2315   NA 135 05/01/2013 1440   K 3.6 11/19/2023 2315   K 4.3 05/01/2013 1440   CL 110 11/19/2023 2315   CL 102 05/01/2013 1440   CO2 22 11/19/2023 2315   CO2 27 (H) 05/01/2013 1440   BUN 10 11/19/2023 2315   BUN 13 05/01/2013 1440   CREATININE 0.49 11/19/2023 2315   CREATININE 0.40 (L) 05/01/2013 1440   GLUCOSE 101 (H) 11/19/2023 2315   GLUCOSE 108 (H) 05/01/2013 1440   CALCIUM 9.6 11/19/2023 2315   CALCIUM 9.8 05/01/2013 1440   CBC:    Component Value Date/Time   WBC 11.7 (H) 11/21/2023 0716   HGB 7.6 (L) 11/21/2023 0716   HGB 6.6 (L) 05/01/2013 1440   HCT 22.7 (L) 11/21/2023 0716   HCT 20.1 (L) 05/01/2013 1440   PLT 424 (H) 11/21/2023 0716   PLT 468 (H) 05/01/2013 1440   MCV 98.3 11/21/2023 0716   MCV 89 05/01/2013 1440   NEUTROABS 6.1 11/19/2023 2315   NEUTROABS 7.9 05/30/2012 1958   LYMPHSABS 4.5 (H) 11/19/2023 2315   LYMPHSABS 5.0 05/30/2012 1958   MONOABS 2.0 (H) 11/19/2023 2315   MONOABS 1.6 (H) 05/30/2012 1958   EOSABS 0.5 11/19/2023 2315  EOSABS 0.6 05/30/2012 1958   BASOSABS 0.2 (H) 11/19/2023 2315   BASOSABS 3 04/28/2013 1736   BASOSABS 0.2 (H) 05/30/2012 1958    No results found for this or any previous visit (from the past 240 hours).  Studies/Results: DG Chest Portable 1 View Result Date: 11/20/2023 CLINICAL DATA:  Chest pain. Back pain and extremity pain for 2 days. Possible sickle cell crisis. EXAM: PORTABLE CHEST 1 VIEW COMPARISON:  10/29/2023 FINDINGS: Shallow inspiration. Heart size and pulmonary vascularity are normal. Lungs are clear. No pleural effusion or pneumothorax. Mediastinal contours appear intact. Visualized bones appear intact. IMPRESSION: No active disease. Electronically Signed   By: Elsie Gravely M.D.   On: 11/20/2023 04:12     Medications: Scheduled Meds:  HYDROmorphone    Intravenous Q4H   senna-docusate  1 tablet Oral BID   Continuous Infusions: PRN Meds:.diphenhydrAMINE , naloxone  **AND** sodium chloride  flush, ondansetron  (ZOFRAN ) IV, polyethylene glycol  Consultants: None   Procedures: None  Antibiotics: None  Assessment/Plan: Principal Problem:   Sickle cell anemia with pain (HCC) Active Problems:   Chronic pain syndrome   Leucocytosis   GAD (generalized anxiety disorder)   Mild intermittent asthma without complication   Anemia of chronic disease   Hb Sickle Cell Disease with Pain crisis: Pain symptoms gradually improving.  Continue IVF 0.45% Saline @ KVO continue weight based Dilaudid  PCA, continue oral home pain medications as ordered. Monitor vitals very closely, Re-evaluate pain scale regularly, 2 L of Oxygen by [place]. Patient encouraged to ambulate on the hallway today.  Leukocytosis: Slightly elevated most likely due to vaso-occlusive crisis.  Will continue to monitor closely without antibiotics at this time.  Anemia of Chronic Disease: Hemoglobin is at baseline of 7. 6 g/dl. Will continue to monitor closely and transfuse as appropriate  Chronic pain Syndrome: continue oral home pain medication as ordered.  Mild intermittent asthma without complication: Stable, no acute excerebration. Continue Symbicort as neede  GAD: Stable, continue medication as prescribed  Code Status: Full Code Family Communication: N/A Disposition Plan: ready for discharge in the AM  Bailey Reese Cover NP   If 7PM-7AM, please contact night-coverage.  11/21/2023, 10:28 AM  LOS: 1 day

## 2023-11-21 NOTE — Progress Notes (Signed)
   11/21/23 1231  TOC Brief Assessment  Insurance and Status Reviewed  Patient has primary care physician Yes  Home environment has been reviewed resides in a private residence  Prior level of function: Independent  Prior/Current Home Services No current home services  Social Drivers of Health Review SDOH reviewed no interventions necessary  Readmission risk has been reviewed Yes  Transition of care needs no transition of care needs at this time

## 2023-11-21 NOTE — Progress Notes (Signed)
 Dear Doctor:  This patient has been identified as a candidate for CVC for the following reason (s): poor veins/poor circulatory system (CHF, COPD, emphysema, diabetes, steroid use, IV drug abuse, etc.) and restarts due to phlebitis and infiltration in 24 hours. No viable Picc veins available. Pt currently has 24 gauge peripheral IV. If you agree, please write an order for the indicated device. For any questions contact the Vascular Access Team at [phone] if no answer, please leave a message.  Thank you for supporting the early vascular access assessment program.

## 2023-11-21 NOTE — Plan of Care (Signed)

## 2023-11-22 LAB — URINALYSIS, COMPLETE (UACMP) WITH MICROSCOPIC
Bacteria, UA: NONE SEEN
Bilirubin Urine: NEGATIVE
Glucose, UA: NEGATIVE mg/dL
Hgb urine dipstick: NEGATIVE
Ketones, ur: NEGATIVE mg/dL
Leukocytes,Ua: NEGATIVE
Nitrite: NEGATIVE
Protein, ur: NEGATIVE mg/dL
Specific Gravity, Urine: 1.011 (ref 1.005–1.030)
pH: 6 (ref 5.0–8.0)

## 2023-11-22 LAB — CBC
HCT: 23.1 % — ABNORMAL LOW (ref 36.0–46.0)
Hemoglobin: 8.2 g/dL — ABNORMAL LOW (ref 12.0–15.0)
MCH: 34.6 pg — ABNORMAL HIGH (ref 26.0–34.0)
MCHC: 35.5 g/dL (ref 30.0–36.0)
MCV: 97.5 fL (ref 80.0–100.0)
Platelets: 441 K/uL — ABNORMAL HIGH (ref 150–400)
RBC: 2.37 MIL/uL — ABNORMAL LOW (ref 3.87–5.11)
RDW: 18.8 % — ABNORMAL HIGH (ref 11.5–15.5)
WBC: 12.4 K/uL — ABNORMAL HIGH (ref 4.0–10.5)
nRBC: 1.9 % — ABNORMAL HIGH (ref 0.0–0.2)

## 2023-11-22 MED ORDER — ENOXAPARIN SODIUM 40 MG/0.4ML IJ SOSY
40.0000 mg | PREFILLED_SYRINGE | INTRAMUSCULAR | Status: DC
  Administered 2023-11-22 – 2023-11-24 (×3): 40 mg via SUBCUTANEOUS
  Filled 2023-11-22 (×3): qty 0.4

## 2023-11-22 NOTE — Progress Notes (Signed)
 Patient ID: ALLETA Reese, female   DOB: 2005/09/17, 18 y.o.   MRN: [id number] Subjective: Bailey Reese  is a 18 y.o. female with medical history significant of sickle cell disease, PTSD, depression with anxiety who usually gets her care at [facility] but was seen at [facility] and admitted to [facility] for sickle cell pain crisis.    Patient endorses pain today of 6/10, dry heaves/nausea gradually improving.  Reports slight dysuria.  Completed urinalysis results pending.  Denies fever, cough, headache, SOB.  Objective:  Vital signs in last 24 hours:  Vitals:   11/22/23 0359 11/22/23 0822 11/22/23 0903 11/22/23 1208  BP:      Pulse:      Resp: 16 15  20   Temp:      TempSrc:      SpO2:  99% 98% 99%  Weight:      Height:        Intake/Output from previous day:   Intake/Output Summary (Last 24 hours) at 11/22/2023 1324 Last data filed at 11/22/2023 1000 Gross per 24 hour  Intake 1440 ml  Output 1800 ml  Net -360 ml    Physical Exam: General: Alert, awake, oriented x3, in no acute distress.  HEENT: Bailey Reese/AT [place]ER[place], EOMI Neck: Trachea midline,  no masses, no thyromegal,y no JVD, no carotid bruit OROPHARYNX:  Moist, No exudate/ erythema/lesions.  Heart: Regular rate and rhythm, without murmurs, rubs, gallops, PMI non-displaced, no heaves or thrills on palpation.  Lungs: Clear to auscultation, no wheezing or rhonchi noted. No increased vocal fremitus resonant to percussion  Abdomen: Soft, nontender, nondistended, positive bowel sounds, no masses no hepatosplenomegaly noted..  Neuro: No focal neurological deficits noted cranial nerves II through XII grossly intact. DTRs 2+ bilaterally upper and lower extremities. Strength 5 out of 5 in bilateral upper and lower extremities. Musculoskeletal: Lower back tenderness Psychiatric: Patient alert and oriented x3, good insight and cognition, good recent to remote recall. Lymph node survey: No cervical axillary or inguinal  lymphadenopathy noted.  Lab Results:  Basic Metabolic Panel:    Component Value Date/Time   NA 139 11/19/2023 2315   NA 135 05/01/2013 1440   K 3.6 11/19/2023 2315   K 4.3 05/01/2013 1440   CL 110 11/19/2023 2315   CL 102 05/01/2013 1440   CO2 22 11/19/2023 2315   CO2 27 (H) 05/01/2013 1440   BUN 10 11/19/2023 2315   BUN 13 05/01/2013 1440   CREATININE 0.49 11/19/2023 2315   CREATININE 0.40 (L) 05/01/2013 1440   GLUCOSE 101 (H) 11/19/2023 2315   GLUCOSE 108 (H) 05/01/2013 1440   CALCIUM 9.6 11/19/2023 2315   CALCIUM 9.8 05/01/2013 1440   CBC:    Component Value Date/Time   WBC 12.4 (H) 11/22/2023 0658   HGB 8.2 (L) 11/22/2023 0658   HGB 6.6 (L) 05/01/2013 1440   HCT 23.1 (L) 11/22/2023 0658   HCT 20.1 (L) 05/01/2013 1440   PLT 441 (H) 11/22/2023 0658   PLT 468 (H) 05/01/2013 1440   MCV 97.5 11/22/2023 0658   MCV 89 05/01/2013 1440   NEUTROABS 6.1 11/19/2023 2315   NEUTROABS 7.9 05/30/2012 1958   LYMPHSABS 4.5 (H) 11/19/2023 2315   LYMPHSABS 5.0 05/30/2012 1958   MONOABS 2.0 (H) 11/19/2023 2315   MONOABS 1.6 (H) 05/30/2012 1958   EOSABS 0.5 11/19/2023 2315   EOSABS 0.6 05/30/2012 1958   BASOSABS 0.2 (H) 11/19/2023 2315   BASOSABS 3 04/28/2013 1736   BASOSABS 0.2 (H) 05/30/2012  1958    No results found for this or any previous visit (from the past 240 hours).  Studies/Results: No results found.    Medications: Scheduled Meds:  [START ON 11/23/2023] buprenorphine   1 patch Transdermal Weekly   Deferasirox   360 mg Oral BID   fluticasone  furoate-vilanterol  1 puff Inhalation Daily   HYDROmorphone    Intravenous Q4H   hydroxyurea   500 mg Oral BID   hydrOXYzine   25 mg Oral BID   lubiprostone   24 mcg Oral BID WC   pantoprazole   40 mg Oral BID   prazosin   1 mg Oral QHS   pregabalin   150 mg Oral TID   senna-docusate  1 tablet Oral BID   Continuous Infusions: PRN Meds:.diphenhydrAMINE , fluticasone , naloxone  **AND** sodium chloride  flush, ondansetron  (ZOFRAN )  IV, ondansetron , oxyCODONE , polyethylene glycol, promethazine   Consultants: None   Procedures: None  Antibiotics: None  Assessment/Plan: Principal Problem:   Sickle cell anemia with pain (HCC) Active Problems:   Chronic pain syndrome   Leucocytosis   GAD (generalized anxiety disorder)   Mild intermittent asthma without complication   Anemia of chronic disease   Hb Sickle Cell Disease with Pain crisis: Pain symptoms gradually improving.  Continue IVF 0.45% Saline @ KVO continue weight based Dilaudid  PCA, continue oral home pain medications as ordered. Monitor vitals very closely, Re-evaluate pain scale regularly, 2 L of Oxygen by [place]. Patient encouraged to ambulate on the hallway today.  Leukocytosis: elevated most likely, no acute signs or symptoms of infection.  Will continue to monitor closely without antibiotics at this time.  Anemia of Chronic Disease: Hemoglobin is at baseline of 8.2 g/dl. Will continue to monitor closely and transfuse as appropriate  Chronic pain Syndrome: continue oral home pain medication as ordered.  Mild intermittent asthma without complication: Stable, no acute excerebration. Continue Symbicort as neede  GAD: Stable, continue medication as prescribed  Code Status: Full Code Family Communication: N/A Disposition Plan: ready for discharge in the AM  Bailey Reese Bailey Reese Cover NP   If 7PM-7AM, please contact night-coverage.  11/22/2023, 1:24 PM  LOS: 2 days

## 2023-11-22 NOTE — Plan of Care (Signed)
   Problem: Education: Goal: Knowledge of General Education information will improve Description Including pain rating scale, medication(s)/side effects and non-pharmacologic comfort measures Outcome: Progressing

## 2023-11-22 NOTE — Progress Notes (Signed)
  [facility] - Unable To Enroll Patient  A [facility] Coordinator has been unable to reach patient after multiple attempts.   Bailey Reese, was identified by admission/discharge/transfer alert as a candidate for [facility] care management services.   At this time, Bailey Reese is not yet enrolled in [facility] care management services.  Please consider discussing the benefits of [facility] with Bailey Reese and referring again at a later date.  For more information on [facility] services, click here.

## 2023-11-23 ENCOUNTER — Inpatient Hospital Stay (HOSPITAL_COMMUNITY)

## 2023-11-23 LAB — BASIC METABOLIC PANEL WITH GFR
Anion gap: 11 (ref 5–15)
BUN: 9 mg/dL (ref 6–20)
CO2: 18 mmol/L — ABNORMAL LOW (ref 22–32)
Calcium: 8.7 mg/dL — ABNORMAL LOW (ref 8.9–10.3)
Chloride: 104 mmol/L (ref 98–111)
Creatinine, Ser: 0.54 mg/dL (ref 0.44–1.00)
GFR, Estimated: >60 mL/min (ref >60)
Glucose, Bld: 145 mg/dL — ABNORMAL HIGH (ref 70–99)
Potassium: 3.6 mmol/L (ref 3.5–5.1)
Sodium: 133 mmol/L — ABNORMAL LOW (ref 135–145)

## 2023-11-23 LAB — CBC
HCT: 19.8 % — ABNORMAL LOW (ref 36.0–46.0)
Hemoglobin: 6.9 g/dL — CL (ref 12.0–15.0)
MCH: 34.3 pg — ABNORMAL HIGH (ref 26.0–34.0)
MCHC: 34.8 g/dL (ref 30.0–36.0)
MCV: 98.5 fL (ref 80.0–100.0)
Platelets: 263 K/uL (ref 150–400)
RBC: 2.01 MIL/uL — ABNORMAL LOW (ref 3.87–5.11)
RDW: 18.5 % — ABNORMAL HIGH (ref 11.5–15.5)
WBC: 15 K/uL — ABNORMAL HIGH (ref 4.0–10.5)
nRBC: 1.2 % — ABNORMAL HIGH (ref 0.0–0.2)

## 2023-11-23 LAB — TYPE AND SCREEN
ABO/RH(D): B POS
Antibody Screen: NEGATIVE

## 2023-11-23 LAB — TROPONIN I (HIGH SENSITIVITY): Troponin I (High Sensitivity): 3 ng/L (ref <18)

## 2023-11-23 LAB — MAGNESIUM: Magnesium: 1.8 mg/dL (ref 1.7–2.4)

## 2023-11-23 LAB — D-DIMER, QUANTITATIVE: D-Dimer, Quant: 1.26 ug{FEU}/mL — ABNORMAL HIGH (ref 0.00–0.50)

## 2023-11-23 MED ORDER — HYDROMORPHONE HCL 1 MG/ML IJ SOLN
1.0000 mg | Freq: Once | INTRAMUSCULAR | Status: AC
  Administered 2023-11-23: 1 mg via INTRAVENOUS
  Filled 2023-11-23: qty 1

## 2023-11-23 MED ORDER — ALPRAZOLAM 0.25 MG PO TABS: 0.2500 mg | ORAL_TABLET | Freq: Once | ORAL | Status: AC

## 2023-11-23 MED ORDER — BUPRENORPHINE 10 MCG/HR TD PTWK
2.0000 | MEDICATED_PATCH | TRANSDERMAL | Status: DC
  Administered 2023-11-23: 2 via TRANSDERMAL
  Filled 2023-11-23: qty 2

## 2023-11-23 MED ORDER — LACTATED RINGERS IV BOLUS
500.0000 mL | Freq: Once | INTRAVENOUS | Status: AC
  Administered 2023-11-23: 500 mL via INTRAVENOUS

## 2023-11-23 MED ORDER — ALPRAZOLAM 0.25 MG PO TABS
ORAL_TABLET | ORAL | Status: AC
  Administered 2023-11-23: 0.25 mg
  Filled 2023-11-23: qty 1

## 2023-11-23 MED ORDER — SALINE SPRAY 0.65 % NA SOLN
1.0000 | NASAL | Status: DC | PRN
  Filled 2023-11-23: qty 44

## 2023-11-23 NOTE — Progress Notes (Signed)
 At 0015, pt complained of heart racing. Assessed, pt's HR running from 113-120's. Notified Hospitalist on call. Anxiety med given as ordered. At 0100, Pt's HR now is running at 130's to 140's. Rapid response team and hospitalist at bedside. New orders administered. At 0330, per telemetry, pt's HR now is up to 145's. New ordrs received

## 2023-11-23 NOTE — Progress Notes (Signed)
 Patient ID: Bailey Reese, female   DOB: Jul 25, 2005, 18 y.o.   MRN: [id number] Subjective: Bailey Reese  is a 18 y.o. female with medical history significant of sickle cell disease, PTSD, depression with anxiety who usually gets her care at [facility] but was seen at [facility] ER and admitted to [facility] for sickle cell pain crisis.    Patient endorses pain today of 6-5/10, dry heaves/nausea gradually improving.  Dysuria symptoms resolved.  Urinalysis negative for UTI.  Denies fever, cough, headache, SOB.  Objective:  Vital signs in last 24 hours:  Vitals:   11/23/23 0059 11/23/23 0244 11/23/23 0341 11/23/23 0734  BP: (!) 115/57     Pulse: (!) 134     Resp: 18 18 18 18   Temp: 98.1 F (36.7 C)     TempSrc:      SpO2: 100% 98% 100% 97%  Weight:      Height:        Intake/Output from previous day:   Intake/Output Summary (Last 24 hours) at 11/23/2023 1133 Last data filed at 11/23/2023 0900 Gross per 24 hour  Intake 1200 ml  Output 1600 ml  Net -400 ml    Physical Exam: General: Alert, awake, oriented x3, in no acute distress.  HEENT: [place]/AT PEERL, EOMI Neck: Trachea midline,  no masses, no thyromegal,y no JVD, no carotid bruit OROPHARYNX:  Moist, No exudate/ erythema/lesions.  Heart: Regular rate and rhythm, without murmurs, rubs, gallops, PMI non-displaced, no heaves or thrills on palpation.  Lungs: Clear to auscultation, no wheezing or rhonchi noted. No increased vocal fremitus resonant to percussion  Abdomen: Soft, nontender, nondistended, positive bowel sounds, no masses no hepatosplenomegaly noted..  Neuro: No focal neurological deficits noted cranial nerves II through XII grossly intact. DTRs 2+ bilaterally upper and lower extremities. Strength 5 out of 5 in bilateral upper and lower extremities. Musculoskeletal: Lower back tenderness Psychiatric: Patient alert and oriented x3, good insight and cognition, good recent to remote recall. Lymph node  survey: No cervical axillary or inguinal lymphadenopathy noted.  Lab Results:  Basic Metabolic Panel:    Component Value Date/Time   NA 133 (L) 11/23/2023 0711   NA 135 05/01/2013 1440   K 3.6 11/23/2023 0711   K 4.3 05/01/2013 1440   CL 104 11/23/2023 0711   CL 102 05/01/2013 1440   CO2 18 (L) 11/23/2023 0711   CO2 27 (H) 05/01/2013 1440   BUN 9 11/23/2023 0711   BUN 13 05/01/2013 1440   CREATININE 0.54 11/23/2023 0711   CREATININE 0.40 (L) 05/01/2013 1440   GLUCOSE 145 (H) 11/23/2023 0711   GLUCOSE 108 (H) 05/01/2013 1440   CALCIUM 8.7 (L) 11/23/2023 0711   CALCIUM 9.8 05/01/2013 1440   CBC:    Component Value Date/Time   WBC 15.0 (H) 11/23/2023 0711   HGB 6.9 (LL) 11/23/2023 0711   HGB 6.6 (L) 05/01/2013 1440   HCT 19.8 (L) 11/23/2023 0711   HCT 20.1 (L) 05/01/2013 1440   PLT 263 11/23/2023 0711   PLT 468 (H) 05/01/2013 1440   MCV 98.5 11/23/2023 0711   MCV 89 05/01/2013 1440   NEUTROABS 6.1 11/19/2023 2315   NEUTROABS 7.9 05/30/2012 1958   LYMPHSABS 4.5 (H) 11/19/2023 2315   LYMPHSABS 5.0 05/30/2012 1958   MONOABS 2.0 (H) 11/19/2023 2315   MONOABS 1.6 (H) 05/30/2012 1958   EOSABS 0.5 11/19/2023 2315   EOSABS 0.6 05/30/2012 1958   BASOSABS 0.2 (H) 11/19/2023 2315   BASOSABS 3 04/28/2013  1736   BASOSABS 0.2 (H) 05/30/2012 1958    No results found for this or any previous visit (from the past 240 hours).  Studies/Results: DG Chest Port 1 View Result Date: 11/23/2023 CLINICAL DATA:  [id number].  Chest pain. EXAM: PORTABLE CHEST 1 VIEW COMPARISON:  Portable chest 11/20/2023 FINDINGS: 4:03 a.m. Interval increased cardiac diameter and increased central vascular distension. There is a low inspiration on exam. There is either mild basilar interstitial edema or bronchovascular crowding from low inspiration. The hypoexpanded lungs otherwise appear clear.  The sulci are sharp. The mediastinum is normally outlined. Slight upper thoracic levoscoliosis. A follow-up study in full  inspiration is recommended preferably PA and lateral views. IMPRESSION: 1. Interval increased cardiac diameter and increased central vascular distension, but could be exaggerated by low inspiration. 2. Low inspiration on exam with either mild basilar interstitial edema or bronchovascular crowding from low inspiration. 3. A follow-up study in full inspiration is recommended preferably PA and lateral views. Electronically Signed   By: Francis Quam M.D.   On: 11/23/2023 04:42      Medications: Scheduled Meds:  buprenorphine   1 patch Transdermal Weekly   Deferasirox   360 mg Oral BID   enoxaparin  (LOVENOX ) injection  40 mg Subcutaneous Q24H   fluticasone  furoate-vilanterol  1 puff Inhalation Daily   HYDROmorphone    Intravenous Q4H   hydroxyurea   500 mg Oral BID   hydrOXYzine   25 mg Oral BID   lubiprostone   24 mcg Oral BID WC   pantoprazole   40 mg Oral BID   prazosin   1 mg Oral QHS   pregabalin   150 mg Oral TID   senna-docusate  1 tablet Oral BID   Continuous Infusions: PRN Meds:.diphenhydrAMINE , fluticasone , naloxone  **AND** sodium chloride  flush, ondansetron  (ZOFRAN ) IV, ondansetron , oxyCODONE , polyethylene glycol, promethazine , sodium chloride   Consultants: None   Procedures: None  Antibiotics: None  Assessment/Plan: Principal Problem:   Sickle cell anemia with pain (HCC) Active Problems:   Chronic pain syndrome   Leucocytosis   GAD (generalized anxiety disorder)   Mild intermittent asthma without complication   Anemia of chronic disease   Hb Sickle Cell Disease with Pain crisis: Pain symptoms gradually improving.  Continue IVF 0.45% Saline @ KVO continue weight based Dilaudid  PCA, continue oral home pain medications as ordered. Monitor vitals very closely, Re-evaluate pain scale regularly, 2 L of Oxygen by [place]. Patient encouraged to ambulate on the hallway today.  Leukocytosis: elevated most likely, no acute signs or symptoms of infection.  Will continue to monitor closely  without antibiotics at this time.  Anemia of Chronic Disease: Hemoglobin is below baseline at 6.9  g/dl. Will continue to monitor closely and transfuse as appropriate  Chronic pain Syndrome: continue oral home pain medication as ordered.  Mild intermittent asthma without complication: Stable, no acute excerebration. Continue Symbicort as neede  GAD: Stable, continue medication as prescribed  Code Status: Full Code Family Communication: N/A Disposition Plan: ready for discharge in the AM  [name] Cover NP   If 7PM-7AM, please contact night-coverage.  11/23/2023, 11:33 AM  LOS: 3 days

## 2023-11-23 NOTE — Plan of Care (Signed)

## 2023-11-23 NOTE — Progress Notes (Signed)
     Patient Name: Bailey Reese           DOB: [birth year]  MRN: [id number]      Admission Date: 11/20/2023  Attending Provider: Jegede, Olugbemiga E, MD  Primary Diagnosis: Sickle cell anemia with pain (HCC)   Level of care: Med-Surg   OVERNIGHT PROGRESS REPORT   Notified by RN of patient with tachycardia, HR 120-130s.  All other vitals stable.  Afebrile.  Prior hemoglobin 8.2, WBC 12.4. Denied lightheadedness, SOB, abdominal discomfort, cough, urinary changes. Initially only complained of palpitations.  She now endorses some chest discomfort (not new), nausea, nasal congestion. Overall general pain 7-8/10, using PCA well. Does not appear to be in distress, a few minutes ago she was distracted playing computer games.    Plan:  EKG interpreted as sinus tachycardia.  Treat discomfort of pain, nausea, allergies as it can contribute to tachycardia. Treat with Zofran , oxycodone  PRN, Benadryl . Cardiac telemetry    Addendum:  0345- Sustaining sinus tachycardia, HR 130-140s while at bedrest.  Diff dx- pain crisis, hypovolemia, PE, acute chest syndrome  Plan-  1 mg IV dilaudid  500 cc fluid bolus Chest x-ray DBC, BMP, D-dimer, troponin     Lavanda Horns, DNP, ACNPC- AG [facility]

## 2023-11-23 NOTE — Significant Event (Signed)
 Rapid Response Event Note   Reason for Call :  Tachycardia in 130's.  Initial Focused Assessment:  Upon arrival, patient alert and oriented, typing/playing on laptop computer, cell phone with active phone call, and staff attempting to get EKG. Patient able to speak clearly and moving all extremities without deficits. Heart sounds regular, tachycardic; lung sounds clear throughout. Patient is afebrile. Patient reports sinus congestion, feeling like something is sitting on her chest, but states difficult to differentiate pain in chest from sickle cell pain. Patient c/o nausea and was retching soon after assessment complete.     Interventions:  EKG and assessment as above. Chart reviewed. Per bedside RN, previous one time dose of Xanax  0.25 mg given thirty minutes prior for anxiety. Provider added orders for cardiac telemetry, allergy/sinus nasal sprays for sinus congestion. Bedside RN to administer previously prescribed doses of Zofran , Oxycodone , and Benadryl . Patient encouraged to try and rest/de-stimulate. Patient states she typically sleeps during the day is unlikely to sleep tonight. Reviewed PCA documentation. Since midnight, patient has used PCA 4 times for total of 2mg  Dilaudid . In first four hours of this shift, patient had 12 doses of 0.5mg  delivered for total of 6mg .  Plan of Care:  Place on cardiac telemetry to monitor HR but will remain at current level of care. Call rapid and provider if no improvement.  Event Summary:   MD Notified: Lavanda Horns, NP (came to bedside) Call Time: 0100 Arrival Time: 0105 End Time: 0130  Alan LITTIE Portugal, RN

## 2023-11-24 LAB — CBC
HCT: 22.7 % — ABNORMAL LOW (ref 36.0–46.0)
Hemoglobin: 7.7 g/dL — ABNORMAL LOW (ref 12.0–15.0)
MCH: 32.8 pg (ref 26.0–34.0)
MCHC: 33.9 g/dL (ref 30.0–36.0)
MCV: 96.6 fL (ref 80.0–100.0)
Platelets: 391 K/uL (ref 150–400)
RBC: 2.35 MIL/uL — ABNORMAL LOW (ref 3.87–5.11)
RDW: 18.3 % — ABNORMAL HIGH (ref 11.5–15.5)
WBC: 15.2 K/uL — ABNORMAL HIGH (ref 4.0–10.5)
nRBC: 2 % — ABNORMAL HIGH (ref 0.0–0.2)

## 2023-11-24 MED ORDER — HYDROMORPHONE 1 MG/ML IV SOLN
INTRAVENOUS | Status: DC
  Administered 2023-11-24: 3.3 mg via INTRAVENOUS
  Administered 2023-11-24: 30 mg via INTRAVENOUS
  Filled 2023-11-24: qty 30

## 2023-11-24 NOTE — Progress Notes (Signed)
 Patient transported down to discharge area by nurse tech via wheelchair.

## 2023-11-24 NOTE — Discharge Summary (Signed)
 Physician Discharge Summary  Bailey Reese:[id number] DOB: Jul 08, 2005 DOA: 11/20/2023  PCP: Morgan Slater Pizza, MD  Admit date: 11/20/2023  Discharge date: 11/24/2023  Discharge Diagnoses:  Principal Problem:   Sickle cell anemia with pain (HCC) Active Problems:   Chronic pain syndrome   Leucocytosis   GAD (generalized anxiety disorder)   Mild intermittent asthma without complication   Anemia of chronic disease   Discharge Condition: Stable  Disposition:  Pt is discharged home in good condition and is to follow up with Little, Slater Pizza, MD this week to have labs evaluated. [name] is instructed to increase activity slowly and balance with rest for the next few days, and use prescribed medication to complete treatment of pain  Diet: Regular Wt Readings from Last 3 Encounters:  11/20/23 65.8 kg (75%, Z= 0.69)*  11/19/23 65.8 kg (75%, Z= 0.69)*  11/08/23 65.8 kg (76%, Z= 0.69)*   * Growth percentiles are based on CDC (Girls, 2-20 Years) data.    History of present illness:  Bailey Reese  is a 18 y.o. female with history of sickle cell disease, PTSD, depression with anxiety who usually gets her care at [facility] but was seen at [facility] for abdominal pain , Nausea and emesis not typical of her sickle cell pain.  Patient was managed in the ER for crisis however her pain was not controlled. Patient was transferred to our care for continued sickle cell crisis pain control.  She has vitals that are stable.  Hemoglobin is around 8.2 g/dl.  She was getting IV Dilaudid  pushes in the ER.  Patient denied diarrhea.  Denied any viral illness, cough, SOB. No urinary symptoms. She has had previous osteomyelitis and multiple episodes of acute chest syndromes.  Also hemorrhagic stroke in 2017.  Patient has chronic pain from her sickle cell disease.  No recent travels or sick contacts.    ED Course:  Patient was treated in the ED with IVF, IV pain medication with  no resolution to symptoms. Chest X-ray is negative. Patient admitted for ongoing sickle cell pain management.  BP: 124/71  Pulse: 101  Resp: 16  Temp: 99.4 F (37.4C)  SpO2: 100%     WBC 13.3 (*)        RBC 2.41 (*)      Hemoglobin 8.2 (*)      HCT 23.8 (*)      RDW 18.8 (*)      Platelets 422 (*)      nRBC 1.7 (*)      Lymphs Abs 4.5 (*)      Monocytes Absolute 2.0 (*)      Basophils Absolute 0.2 (*)      All other components within normal limits  BASIC METABOLIC PANEL WITH GFR - Abnormal; Notable for the following components:    Glucose, Bld 101 (*)      All other components within normal limits  RETICULOCYTES - Abnormal; Notable for the following components:    Retic Ct Pct 14.9 (*)      RBC. 2.43 (*)      Retic Count, Absolute 360.9 (*)      Immature Retic Fract 39.6 (*)      All other components within normal limits  URINALYSIS, W/ REFLEX TO CULTURE (INFECTION SUSPECTED) - Abnormal; Notable for the following components:    Color, Urine YELLOW (*)      APPearance HAZY (*)      All other components within normal limits  POC URINE PREG, ED          Hospital Course:  Patient was admitted for sickle cell pain crisis and managed appropriately with IVF, IV Dilaudid  via PCA and IV Toradol , as well as other adjunct therapies per sickle cell pain management protocols. Patient is reporting improved pain, walking without assistance tolerating PO without obstruction. Patient will continue to manage chronic pain at home.  She has no new concerns Patient was therefore discharged home today in a hemodynamically stable condition.   Corynne will follow-up with PCP within 1 week of this discharge. Ryen was counseled extensively about nonpharmacologic means of pain management, patient verbalized understanding and was appreciative of  the care received during this admission.   We discussed the need for good hydration, monitoring of hydration status, avoidance of heat, cold, stress,  and infection triggers. We discussed the need to be adherent with taking other home medications. Patient was reminded of the need to seek medical attention immediately if any symptom of bleeding, anemia, or infection occurs.  Discharge Exam: Vitals:   11/24/23 0645 11/24/23 0730  BP:    Pulse:    Resp:  17  Temp:    SpO2: 100% 98%   Vitals:   11/24/23 0003 11/24/23 0431 11/24/23 0645 11/24/23 0730  BP:      Pulse:      Resp: 20 20  17   Temp:      TempSrc:      SpO2: 99% 99% 100% 98%  Weight:      Height:        General appearance : Awake, alert, not in any distress. Speech Clear. Not toxic looking HEENT: Atraumatic and Normocephalic, pupils equally reactive to light and accomodation Neck: Supple, no JVD. No cervical lymphadenopathy.  Chest: Good air entry bilaterally, no added sounds  CVS: S1 S2 regular, no murmurs.  Abdomen: Bowel sounds present, Non tender and not distended with no gaurding, rigidity or rebound. Extremities: B/L Lower Ext shows no edema, both legs are warm to touch Neurology: Awake alert, and oriented X 3, CN II-XII intact, Non focal Skin: No Rash  Discharge Instructions       The results of significant diagnostics from this hospitalization (including imaging, microbiology, ancillary and laboratory) are listed below for reference.    Significant Diagnostic Studies: DG Chest Port 1 View Result Date: 11/23/2023 CLINICAL DATA:  [id number].  Chest pain. EXAM: PORTABLE CHEST 1 VIEW COMPARISON:  Portable chest 11/20/2023 FINDINGS: 4:03 a.m. Interval increased cardiac diameter and increased central vascular distension. There is a low inspiration on exam. There is either mild basilar interstitial edema or bronchovascular crowding from low inspiration. The hypoexpanded lungs otherwise appear clear.  The sulci are sharp. The mediastinum is normally outlined. Slight upper thoracic levoscoliosis. A follow-up study in full inspiration is recommended preferably PA and  lateral views. IMPRESSION: 1. Interval increased cardiac diameter and increased central vascular distension, but could be exaggerated by low inspiration. 2. Low inspiration on exam with either mild basilar interstitial edema or bronchovascular crowding from low inspiration. 3. A follow-up study in full inspiration is recommended preferably PA and lateral views. Electronically Signed   By: Francis Quam M.D.   On: 11/23/2023 04:42   DG Chest Portable 1 View Result Date: 11/20/2023 CLINICAL DATA:  Chest pain. Back pain and extremity pain for 2 days. Possible sickle cell crisis. EXAM: PORTABLE CHEST 1 VIEW COMPARISON:  10/29/2023 FINDINGS: Shallow inspiration. Heart size and pulmonary vascularity are normal. Lungs are clear. No  pleural effusion or pneumothorax. Mediastinal contours appear intact. Visualized bones appear intact. IMPRESSION: No active disease. Electronically Signed   By: Elsie Gravely M.D.   On: 11/20/2023 04:12   CT ABDOMEN PELVIS W CONTRAST Result Date: 10/29/2023 EXAM: CT ABDOMEN AND PELVIS WITH CONTRAST 10/29/2023 11:19:47 PM TECHNIQUE: CT of the abdomen and pelvis was performed with the administration of intravenous contrast. Multiplanar reformatted images are provided for review. Automated exposure control, iterative reconstruction, and/or weight based adjustment of the mA/kV was utilized to reduce the radiation dose to as low as reasonably achievable. COMPARISON: None available. CLINICAL HISTORY: RLQ, LUQ pain. fever, emesis. sickle cell pain crisis. eval appy. Pt presents to ER from home with sickle pain for the past 2 days. Pt reports she recently got discharged from hospital and her PCP changed who prescribed the medicine and was not able to fill her prescription. Pt talks in complete sentences mild distress noted. FINDINGS: LOWER CHEST: No acute abnormality. LIVER: The liver is unremarkable. GALLBLADDER AND BILE DUCTS: Status post cholecystectomy. Mildly thick gallbladder. SPLEEN: No  acute abnormality. PANCREAS: No acute abnormality. ADRENAL GLANDS: No acute abnormality. KIDNEYS, URETERS AND BLADDER: No stones in the kidneys or ureters. No hydronephrosis. No perinephric or periureteral stranding. Urinary bladder is unremarkable. GI AND BOWEL: Stomach demonstrates no acute abnormality. There is no bowel obstruction. No bowel wall thickening. Normal appendix (coronal image 34). Mild-to-moderate colonic stool burden. PERITONEUM AND RETROPERITONEUM: No ascites. No free air. VASCULATURE: Aorta is normal in caliber. LYMPH NODES: No lymphadenopathy. REPRODUCTIVE ORGANS: Retroverted uterus. BONES AND SOFT TISSUES: No acute osseous abnormality. No focal soft tissue abnormality. IMPRESSION: 1. Normal appendix. 2. No acute findings. 3. Status post cholecystectomy with mildly thick gallbladder. 4. Retroverted uterus. 5. Mild-to-moderate colonic stool burden. Electronically signed by: Pinkie Pebbles MD 10/29/2023 11:25 PM EDT RP Workstation: [id number]   DG Chest Portable 1 View Result Date: 10/29/2023 CLINICAL DATA:  Chest pain, fever and sickle cell disease. EXAM: PORTABLE CHEST 1 VIEW COMPARISON:  AP and lateral chest 10/16/2023 FINDINGS: The heart is slightly enlarged. No vascular congestion is seen. The mediastinum is normally outlined. The lungs are clear. The sulci are sharp. Thoracic cage is intact with mild upper thoracic levoscoliosis. Compare: Stable chest with slight cardiomegaly. IMPRESSION: No evidence of acute chest disease. Slight cardiomegaly. Electronically Signed   By: Francis Quam M.D.   On: 10/29/2023 22:28    Microbiology: No results found for this or any previous visit (from the past 240 hours).   Labs: Basic Metabolic Panel: Recent Labs  Lab 11/19/23 2315 11/23/23 0711  NA 139 133*  K 3.6 3.6  CL 110 104  CO2 22 18*  GLUCOSE 101* 145*  BUN 10 9  CREATININE 0.49 0.54  CALCIUM 9.6 8.7*  MG  --  1.8   Liver Function Tests: No results for input(s): AST,  ALT, ALKPHOS, BILITOT, PROT, ALBUMIN in the last 168 hours.  No results for input(s): LIPASE, AMYLASE in the last 168 hours. No results for input(s): AMMONIA in the last 168 hours. CBC: Recent Labs  Lab 11/19/23 2315 11/21/23 0716 11/22/23 0658 11/23/23 0711  WBC 13.3* 11.7* 12.4* 15.0*  NEUTROABS 6.1  --   --   --   HGB 8.2* 7.6* 8.2* 6.9*  HCT 23.8* 22.7* 23.1* 19.8*  MCV 98.8 98.3 97.5 98.5  PLT 422* 424* 441* 263   Cardiac Enzymes: No results for input(s): CKTOTAL, CKMB, CKMBINDEX, TROPONINI in the last 168 hours. BNP: Invalid input(s): POCBNP CBG: No results  for input(s): GLUCAP in the last 168 hours.  Time coordinating discharge: 50 minutes  Signed:  [name] Cover NP   [facility] 11/24/2023, 9:37 AM

## 2023-11-27 ENCOUNTER — Ambulatory Visit: Payer: Self-pay | Admitting: Nurse Practitioner

## 2023-11-29 NOTE — Progress Notes (Signed)
 [facility] Care Management - Rescheduled Appointment  Shiva Karis was contacted due to a missed appointment; Kahlyn Shippey has been unable to reach after multiple attempts.  We are closing Tish Lager case at this time.    Bailey Reese   For more information on [facility] care management services, click here.

## 2023-12-01 ENCOUNTER — Other Ambulatory Visit: Payer: Self-pay

## 2023-12-01 ENCOUNTER — Emergency Department

## 2023-12-01 ENCOUNTER — Encounter: Payer: Self-pay | Admitting: Emergency Medicine

## 2023-12-01 ENCOUNTER — Emergency Department
Admission: EM | Admit: 2023-12-01 | Discharge: 2023-12-02 | Disposition: A | Discharge status: Other Acute Inpatient Hospital | Attending: Emergency Medicine | Admitting: Emergency Medicine

## 2023-12-01 DIAGNOSIS — R079 Chest pain, unspecified: Secondary | ICD-10-CM | POA: Insufficient documentation

## 2023-12-01 DIAGNOSIS — R197 Diarrhea, unspecified: Secondary | ICD-10-CM | POA: Diagnosis not present

## 2023-12-01 DIAGNOSIS — R112 Nausea with vomiting, unspecified: Secondary | ICD-10-CM | POA: Insufficient documentation

## 2023-12-01 DIAGNOSIS — R051 Acute cough: Secondary | ICD-10-CM

## 2023-12-01 DIAGNOSIS — D5700 Hb-SS disease with crisis, unspecified: Principal | ICD-10-CM | POA: Insufficient documentation

## 2023-12-01 DIAGNOSIS — R059 Cough, unspecified: Secondary | ICD-10-CM | POA: Insufficient documentation

## 2023-12-01 LAB — URINALYSIS, W/ REFLEX TO CULTURE (INFECTION SUSPECTED)
Bilirubin Urine: NEGATIVE
Glucose, UA: NEGATIVE mg/dL
Hgb urine dipstick: NEGATIVE
Ketones, ur: NEGATIVE mg/dL
Leukocytes,Ua: NEGATIVE
Nitrite: NEGATIVE
Protein, ur: NEGATIVE mg/dL
RBC / HPF: 0 RBC/hpf (ref 0–5)
Specific Gravity, Urine: 1.011 (ref 1.005–1.030)
pH: 7 (ref 5.0–8.0)

## 2023-12-01 LAB — CBC WITH DIFFERENTIAL/PLATELET
Abs Immature Granulocytes: 0.05 K/uL (ref 0.00–0.07)
Basophils Absolute: 0.1 K/uL (ref 0.0–0.1)
Basophils Relative: 1 %
Eosinophils Absolute: 0.4 K/uL (ref 0.0–0.5)
Eosinophils Relative: 3 %
HCT: 23.6 % — ABNORMAL LOW (ref 36.0–46.0)
Hemoglobin: 8.4 g/dL — ABNORMAL LOW (ref 12.0–15.0)
Immature Granulocytes: 0 %
Lymphocytes Relative: 27 %
Lymphs Abs: 3.7 K/uL (ref 0.7–4.0)
MCH: 34.6 pg — ABNORMAL HIGH (ref 26.0–34.0)
MCHC: 35.6 g/dL (ref 30.0–36.0)
MCV: 97.1 fL (ref 80.0–100.0)
Monocytes Absolute: 2 K/uL — ABNORMAL HIGH (ref 0.1–1.0)
Monocytes Relative: 15 %
Neutro Abs: 7.3 K/uL (ref 1.7–7.7)
Neutrophils Relative %: 54 %
Platelets: 500 K/uL — ABNORMAL HIGH (ref 150–400)
RBC: 2.43 MIL/uL — ABNORMAL LOW (ref 3.87–5.11)
RDW: 20 % — ABNORMAL HIGH (ref 11.5–15.5)
WBC: 13.5 K/uL — ABNORMAL HIGH (ref 4.0–10.5)
nRBC: 2.5 % — ABNORMAL HIGH (ref 0.0–0.2)

## 2023-12-01 LAB — COMPREHENSIVE METABOLIC PANEL WITH GFR
ALT: 39 U/L (ref 0–44)
AST: 31 U/L (ref 15–41)
Albumin: 4 g/dL (ref 3.5–5.0)
Alkaline Phosphatase: 118 U/L (ref 38–126)
Anion gap: 8 (ref 5–15)
BUN: 7 mg/dL (ref 6–20)
CO2: 22 mmol/L (ref 22–32)
Calcium: 9.2 mg/dL (ref 8.9–10.3)
Chloride: 108 mmol/L (ref 98–111)
Creatinine, Ser: 0.48 mg/dL (ref 0.44–1.00)
GFR, Estimated: >60 mL/min (ref >60)
Glucose, Bld: 93 mg/dL (ref 70–99)
Potassium: 4 mmol/L (ref 3.5–5.1)
Sodium: 138 mmol/L (ref 135–145)
Total Bilirubin: 4.8 mg/dL — ABNORMAL HIGH (ref 0.0–1.2)
Total Protein: 7.9 g/dL (ref 6.5–8.1)

## 2023-12-01 LAB — POC URINE PREG, ED: Preg Test, Ur: NEGATIVE

## 2023-12-01 LAB — RETICULOCYTES
Immature Retic Fract: 44.4 % — ABNORMAL HIGH (ref 2.3–15.9)
RBC.: 2.44 MIL/uL — ABNORMAL LOW (ref 3.87–5.11)
Retic Count, Absolute: 439 K/uL — ABNORMAL HIGH (ref 19.0–186.0)
Retic Ct Pct: 19.5 % — ABNORMAL HIGH (ref 0.4–3.1)

## 2023-12-01 LAB — RESP PANEL BY RT-PCR (RSV, FLU A&B, COVID)  RVPGX2
Influenza A by PCR: NEGATIVE
Influenza B by PCR: NEGATIVE
Resp Syncytial Virus by PCR: NEGATIVE
SARS Coronavirus 2 by RT PCR: NEGATIVE

## 2023-12-01 LAB — BRAIN NATRIURETIC PEPTIDE: B Natriuretic Peptide: 22.4 pg/mL (ref 0.0–100.0)

## 2023-12-01 LAB — TROPONIN I (HIGH SENSITIVITY)
Troponin I (High Sensitivity): 4 ng/L (ref <18)
Troponin I (High Sensitivity): 4 ng/L (ref <18)

## 2023-12-01 LAB — PREGNANCY, URINE: Preg Test, Ur: NEGATIVE

## 2023-12-01 LAB — GROUP A STREP BY PCR: Group A Strep by PCR: NOT DETECTED

## 2023-12-01 MED ORDER — KETOROLAC TROMETHAMINE 30 MG/ML IJ SOLN
30.0000 mg | Freq: Once | INTRAMUSCULAR | Status: AC
  Administered 2023-12-01: 30 mg via INTRAVENOUS
  Filled 2023-12-01: qty 1

## 2023-12-01 MED ORDER — LACTATED RINGERS IV BOLUS
1000.0000 mL | Freq: Once | INTRAVENOUS | Status: AC
  Administered 2023-12-01: 1000 mL via INTRAVENOUS

## 2023-12-01 MED ORDER — HYDROMORPHONE HCL 1 MG/ML IJ SOLN
1.0000 mg | Freq: Once | INTRAMUSCULAR | Status: AC
  Administered 2023-12-01: 1 mg via INTRAVENOUS
  Filled 2023-12-01: qty 1

## 2023-12-01 MED ORDER — ONDANSETRON HCL 4 MG/2ML IJ SOLN
4.0000 mg | Freq: Once | INTRAMUSCULAR | Status: AC
  Administered 2023-12-01: 4 mg via INTRAVENOUS
  Filled 2023-12-01: qty 2

## 2023-12-01 NOTE — ED Notes (Signed)
 Meal tray given

## 2023-12-01 NOTE — ED Provider Notes (Signed)
 [facility] Provider Note    Event Date/Time   First MD Initiated Contact with Patient 12/01/23 2040     (approximate)   History   Nausea and Emesis   HPI  Bailey Reese is a 18 y.o. female with sickle cell disease, PTSD, GAD, depression, presenting with bodyaches, fever, cough, mild chest pain and shortness of breath, nausea vomiting diarrhea.  She denies any vaginal discharge or bleeding, no urinary symptoms.  States that she tried her buprenorphine  without success.  States that her oncologist took her off her typical pain medications recently.  Patient states that she is in the middle of getting her care transition from [facility].  Per independent history from EMS, patient called out for nausea vomiting as well as shortness of breath.  Thinks that she may have been her pain crisis.  On independent chart review, she was admitted in early July for sickle cell pain crisis, has history of chronic pain from her sickle cell, has prior episodes of acute chest syndrome and previous osteomyelitis.  Her pain was manage via Dilaudid  PCA as well as IV Toradol .  Improved.  Pain protocol:  If presenting with uncomplicated sickle cell pain crisis, would start IV dilaudid  1mg . May give a second dose in 30 minutes. May repeat x3.  If she is hospitalized, she may start ketorlolac 30 mg q6 hours x 5 days as long as renal function appropriate. She may start a dilaudid  PCA at 0.16 continuous with 0.16 demand and narcan  for itching.  No IV Benadryl  or phenergan  should be given.  No prescription opioids should be provided at discharge from the ED.     Physical Exam   Triage Vital Signs: ED Triage Vitals  Encounter Vitals Group     BP      Girls Systolic BP Percentile      Girls Diastolic BP Percentile      Boys Systolic BP Percentile      Boys Diastolic BP Percentile      Pulse      Resp      Temp      Temp src      SpO2      Weight      Height      Head  Circumference      Peak Flow      Pain Score      Pain Loc      Pain Education      Exclude from Growth Chart     Most recent vital signs: Vitals:   12/01/23 2043  BP: 124/76  Pulse: 80  Resp: 19  Temp: 98.7 F (37.1 C)  SpO2: 100%     General: Awake, no distress.  CV:  Good peripheral perfusion.  Resp:  Normal effort.  Clear Abd:  No distention.  Soft nontender Other:  She does have left submandibular lymphadenopathy, posterior oropharynx without any overt erythema, no tonsillar exudates, no swelling or trismus   ED Results / Procedures / Treatments   Labs (all labs ordered are listed, but only abnormal results are displayed) Labs Reviewed  COMPREHENSIVE METABOLIC PANEL WITH GFR - Abnormal; Notable for the following components:      Result Value   Total Bilirubin 4.8 (*)    All other components within normal limits  CBC WITH DIFFERENTIAL/PLATELET - Abnormal; Notable for the following components:   WBC 13.5 (*)    RBC 2.43 (*)    Hemoglobin 8.4 (*)  HCT 23.6 (*)    MCH 34.6 (*)    RDW 20.0 (*)    Platelets 500 (*)    nRBC 2.5 (*)    Monocytes Absolute 2.0 (*)    All other components within normal limits  RETICULOCYTES - Abnormal; Notable for the following components:   Retic Ct Pct 19.5 (*)    RBC. 2.44 (*)    Retic Count, Absolute 439.0 (*)    Immature Retic Fract 44.4 (*)    All other components within normal limits  URINALYSIS, W/ REFLEX TO CULTURE (INFECTION SUSPECTED) - Abnormal; Notable for the following components:   Color, Urine YELLOW (*)    APPearance HAZY (*)    Bacteria, UA RARE (*)    All other components within normal limits  RESP PANEL BY RT-PCR (RSV, FLU A&B, COVID)  RVPGX2  GROUP A STREP BY PCR  BRAIN NATRIURETIC PEPTIDE  PREGNANCY, URINE  TROPONIN I (HIGH SENSITIVITY)  TROPONIN I (HIGH SENSITIVITY)     EKG  EKG shows, EKG shows sinus rhythm, normal QRS, normal QTc, no obvious ischemic ST elevation, T wave inversion in V2, V3, T  wave inversion in V3 is new compared to prior   RADIOLOGY On my independent interpretation, chest x-ray without obvious consolidation   PROCEDURES:  Critical Care performed: No  Procedures   MEDICATIONS ORDERED IN ED: Medications  HYDROmorphone  (DILAUDID ) injection 1 mg (has no administration in time range)  lactated ringers  bolus 1,000 mL (0 mLs Intravenous Stopped 12/01/23 2143)  HYDROmorphone  (DILAUDID ) injection 1 mg (1 mg Intravenous Given 12/01/23 2110)  HYDROmorphone  (DILAUDID ) injection 1 mg (1 mg Intravenous Given 12/01/23 2232)  ketorolac  (TORADOL ) 30 MG/ML injection 30 mg (30 mg Intravenous Given 12/01/23 2231)  ondansetron  (ZOFRAN ) injection 4 mg (4 mg Intravenous Given 12/01/23 2232)     IMPRESSION / MDM / ASSESSMENT AND PLAN / ED COURSE  I reviewed the triage vital signs and the nursing notes.                              Differential diagnosis includes, but is not limited to, viral illness, gastroenteritis, strep throat, sickle cell pain crisis, anemia, electrolyte derangement, dehydration, pneumonia, acute chest syndrome, she has no abdominal tenderness on exam to suggest colitis or diverticulitis or appendicitis.  Will get labs, EKG, troponin, chest x-ray, will give some IV fluids here and treat her with IV pain medications.  Patient's presentation is most consistent with acute presentation with potential threat to life or bodily function.  Independent interpretation of labs and imaging below.  Patient given 2 rounds of pain medications, will give her last round.  Patient signed out pending reassessment after third round of pain medications, will likely need to be admitted if pain is not controlled.  The patient is on the cardiac monitor to evaluate for evidence of arrhythmia and/or significant heart rate changes.   Clinical Course as of 12/01/23 2319  Fri Dec 01, 2023  2156 Independent review of labs, shows mild leukocytosis, hemoglobin is improving compared to  prior, electrolyte severely deranged, T. bili is elevated but has been elevated in the past, likely secondary to sickle cell, UA is not consistent with UTI, strep is negative, troponin is not elevated, respiratory viral panel is negative, pregnancy test is negative, reticulocyte count is elevated. [TT]  2156 DG Chest 2 View No active cardiopulmonary disease.  Upper normal cardiac size.  [TT]  2218 On reassessment patient states  her pain is still pretty bad, will give her another dose of Dilaudid , will add on Toradol  as well as some Zofran  since she is feeling a little bit nauseous. [TT]  2317 On reassessment patient states that her pain is still severe, discussed with her about possible admission, she wants to try 1 more dose before deciding.  Understands that in the admission means a transfer to [facility].  She is agreeable with that plan.  Will order another round of Dilaudid  for her. [TT]    Clinical Course User Index [TT] Waymond Lorelle Cummins, MD     FINAL CLINICAL IMPRESSION(S) / ED DIAGNOSES   Final diagnoses:  Nausea and vomiting, unspecified vomiting type  Chest pain, unspecified type  Acute cough  Diarrhea, unspecified type  Sickle cell pain crisis (HCC)     Rx / DC Orders   ED Discharge Orders     None        Note:  This document was prepared using Dragon voice recognition software and may include unintentional dictation errors.    Waymond Lorelle Cummins, MD 12/01/23 [phone]

## 2023-12-01 NOTE — ED Triage Notes (Signed)
 BIB ems from home for sickle cell flare up  N,v, d, cough, fever x4-5 days  No relief with Buprenorphine  patches, zofran  at home  Pt a&ox4, nadn

## 2023-12-01 NOTE — ED Provider Notes (Signed)
 ----------------------------------------- 11:28 PM on 12/01/2023 -----------------------------------------  Assuming care from Dr. Waymond.  In short, Bailey Reese is a 18 y.o. female with a chief complaint of sickle cell crisis.  Refer to the original H&P for additional details.  The current plan of care is to reassess after another dose of Dilaudid .  Patient anticipates that she will need transfer to [facility].   Clinical Course as of 12/02/23 0355  Fri Dec 01, 2023  2156 Independent review of labs, shows mild leukocytosis, hemoglobin is improving compared to prior, electrolyte severely deranged, T. bili is elevated but has been elevated in the past, likely secondary to sickle cell, UA is not consistent with UTI, strep is negative, troponin is not elevated, respiratory viral panel is negative, pregnancy test is negative, reticulocyte count is elevated. [TT]  2156 DG Chest 2 View No active cardiopulmonary disease.  Upper normal cardiac size.  [TT]  2218 On reassessment patient states her pain is still pretty bad, will give her another dose of Dilaudid , will add on Toradol  as well as some Zofran  since she is feeling a little bit nauseous. [TT]  2317 On reassessment patient states that her pain is still severe, discussed with her about possible admission, she wants to try 1 more dose before deciding.  Understands that in the admission means a transfer to [facility].  She is agreeable with that plan.  Will order another round of Dilaudid  for her. [TT]  Sat Dec 02, 2023  0132 Reassessed patient.  She seems comfortable and is hemodynamically stable but she reports that her pain is still severe.  She said that she feels that she would benefit from transfer to [facility] for additional management.  She clarified that she has been struggling since her hematologist at [facility] took her off of her chronic pain medicine.  She intended to change to a different hospital system but has not yet been  able to do so and she was not able to get an appointment at [facility] clinic until September.  Contacting [facility] to discuss transfer. [CF]  0218 Patient reporting more pain.  We do not have access to Dilaudid  PCAs so I ordered another Dilaudid  1 mg IV. [CF]  0323 No call yet from hospitalist.  Sending secure chat text message to Dr. CANDIE Ned, who is listed as the night admitter at [facility]. [CF]  0330 Spoke with Dr. Ned who reported there have been some issues with the paging system and/or other admitting hospitalist tonight, but we discussed the case in detail and she agreed to accept the patient as a transfer.  Awaiting room assignment and transfer [CF]  [phone] Patient requesting more nausea medication.  I will try famotidine  20 mg IV as an antihistamine antiemetic in an effort to avoid any additional sedating medications as well as anything that may cause akathisia or QTc prolongation [CF]    Clinical Course User Index [CF] Gordan Huxley, MD [TT] Waymond Lorelle Cummins, MD     Medications  lactated ringers  bolus 1,000 mL (0 mLs Intravenous Stopped 12/01/23 2143)  HYDROmorphone  (DILAUDID ) injection 1 mg (1 mg Intravenous Given 12/01/23 2110)  HYDROmorphone  (DILAUDID ) injection 1 mg (1 mg Intravenous Given 12/01/23 2232)  ketorolac  (TORADOL ) 30 MG/ML injection 30 mg (30 mg Intravenous Given 12/01/23 2231)  ondansetron  (ZOFRAN ) injection 4 mg (4 mg Intravenous Given 12/01/23 2232)  HYDROmorphone  (DILAUDID ) injection 1 mg (1 mg Intravenous Given 12/01/23 2327)  HYDROmorphone  (DILAUDID ) injection 1 mg (1 mg Intravenous Given  12/02/23 0221)  ondansetron  (ZOFRAN ) injection 4 mg (4 mg Intravenous Given 12/02/23 0219)     ED Discharge Orders     None      Final diagnoses:  Nausea and vomiting, unspecified vomiting type  Chest pain, unspecified type  Acute cough  Diarrhea, unspecified type  Sickle cell pain crisis (HCC)     Gordan Huxley, MD 12/02/23 [phone]

## 2023-12-01 NOTE — ED Notes (Signed)
Water given  

## 2023-12-02 ENCOUNTER — Encounter (HOSPITAL_COMMUNITY): Payer: Self-pay | Admitting: Internal Medicine

## 2023-12-02 ENCOUNTER — Inpatient Hospital Stay (HOSPITAL_COMMUNITY)
Admission: AD | Admit: 2023-12-02 | Discharge: 2023-12-08 | DRG: 812 | Disposition: A | Discharge status: Home / Self Care | Source: Other Acute Inpatient Hospital | Attending: Internal Medicine | Admitting: Internal Medicine

## 2023-12-02 ENCOUNTER — Encounter (HOSPITAL_COMMUNITY): Payer: Self-pay

## 2023-12-02 DIAGNOSIS — G894 Chronic pain syndrome: Secondary | ICD-10-CM | POA: Diagnosis present

## 2023-12-02 DIAGNOSIS — Z7951 Long term (current) use of inhaled steroids: Secondary | ICD-10-CM | POA: Diagnosis not present

## 2023-12-02 DIAGNOSIS — F4310 Post-traumatic stress disorder, unspecified: Secondary | ICD-10-CM | POA: Diagnosis present

## 2023-12-02 DIAGNOSIS — Z8673 Personal history of transient ischemic attack (TIA), and cerebral infarction without residual deficits: Secondary | ICD-10-CM | POA: Diagnosis not present

## 2023-12-02 DIAGNOSIS — F411 Generalized anxiety disorder: Secondary | ICD-10-CM | POA: Diagnosis present

## 2023-12-02 DIAGNOSIS — Z743 Need for continuous supervision: Secondary | ICD-10-CM | POA: Diagnosis not present

## 2023-12-02 DIAGNOSIS — Z833 Family history of diabetes mellitus: Secondary | ICD-10-CM

## 2023-12-02 DIAGNOSIS — J4520 Mild intermittent asthma, uncomplicated: Secondary | ICD-10-CM | POA: Diagnosis present

## 2023-12-02 DIAGNOSIS — Z79899 Other long term (current) drug therapy: Secondary | ICD-10-CM | POA: Diagnosis not present

## 2023-12-02 DIAGNOSIS — R112 Nausea with vomiting, unspecified: Secondary | ICD-10-CM | POA: Diagnosis not present

## 2023-12-02 DIAGNOSIS — D5700 Hb-SS disease with crisis, unspecified: Principal | ICD-10-CM | POA: Diagnosis present

## 2023-12-02 DIAGNOSIS — I959 Hypotension, unspecified: Secondary | ICD-10-CM | POA: Diagnosis not present

## 2023-12-02 DIAGNOSIS — D638 Anemia in other chronic diseases classified elsewhere: Secondary | ICD-10-CM | POA: Diagnosis present

## 2023-12-02 DIAGNOSIS — D72829 Elevated white blood cell count, unspecified: Secondary | ICD-10-CM | POA: Diagnosis present

## 2023-12-02 DIAGNOSIS — D571 Sickle-cell disease without crisis: Secondary | ICD-10-CM | POA: Diagnosis present

## 2023-12-02 MED ORDER — HYDROXYUREA 500 MG PO CAPS
500.0000 mg | ORAL_CAPSULE | Freq: Two times a day (BID) | ORAL | Status: DC
  Administered 2023-12-02 – 2023-12-08 (×13): 500 mg via ORAL
  Filled 2023-12-02 (×13): qty 1

## 2023-12-02 MED ORDER — KETOROLAC TROMETHAMINE 15 MG/ML IJ SOLN
15.0000 mg | Freq: Four times a day (QID) | INTRAMUSCULAR | Status: AC
  Administered 2023-12-02 – 2023-12-06 (×20): 15 mg via INTRAVENOUS
  Filled 2023-12-02 (×20): qty 1

## 2023-12-02 MED ORDER — FLUTICASONE FUROATE-VILANTEROL 100-25 MCG/ACT IN AEPB
1.0000 | INHALATION_SPRAY | Freq: Every day | RESPIRATORY_TRACT | Status: DC
  Administered 2023-12-03 – 2023-12-08 (×6): 1 via RESPIRATORY_TRACT
  Filled 2023-12-02: qty 28

## 2023-12-02 MED ORDER — HYDROMORPHONE HCL 1 MG/ML IJ SOLN: 1.0000 mg | INTRAMUSCULAR | Status: DC

## 2023-12-02 MED ORDER — LUBIPROSTONE 24 MCG PO CAPS
24.0000 ug | ORAL_CAPSULE | Freq: Two times a day (BID) | ORAL | Status: DC
  Administered 2023-12-02 – 2023-12-08 (×12): 24 ug via ORAL
  Filled 2023-12-02 (×12): qty 1

## 2023-12-02 MED ORDER — FAMOTIDINE IN NACL 20-0.9 MG/50ML-% IV SOLN
20.0000 mg | Freq: Once | INTRAVENOUS | Status: AC
  Administered 2023-12-02: 20 mg via INTRAVENOUS
  Filled 2023-12-02: qty 50

## 2023-12-02 MED ORDER — HYDROMORPHONE HCL 1 MG/ML IJ SOLN
1.0000 mg | Freq: Once | INTRAMUSCULAR | Status: AC | PRN
  Administered 2023-12-02: 1 mg via INTRAVENOUS
  Filled 2023-12-02: qty 1

## 2023-12-02 MED ORDER — SENNOSIDES-DOCUSATE SODIUM 8.6-50 MG PO TABS
1.0000 | ORAL_TABLET | Freq: Two times a day (BID) | ORAL | Status: DC
  Administered 2023-12-02 – 2023-12-08 (×13): 1 via ORAL
  Filled 2023-12-02 (×13): qty 1

## 2023-12-02 MED ORDER — POLYETHYLENE GLYCOL 3350 17 G PO PACK: 17.0000 g | PACK | Freq: Every day | ORAL | Status: DC | PRN

## 2023-12-02 MED ORDER — HYDROMORPHONE 1 MG/ML IV SOLN
INTRAVENOUS | Status: DC
  Administered 2023-12-02: 30 mg via INTRAVENOUS
  Administered 2023-12-02: 2.4 mg via INTRAVENOUS
  Administered 2023-12-02: 3.2 mg via INTRAVENOUS
  Administered 2023-12-02: 0.8 mg via INTRAVENOUS
  Administered 2023-12-02: 3.2 mg via INTRAVENOUS
  Administered 2023-12-03: 4.8 mg via INTRAVENOUS
  Administered 2023-12-03: 4.4 mg via INTRAVENOUS
  Administered 2023-12-03: 4 mg via INTRAVENOUS
  Administered 2023-12-03: 3.2 mg via INTRAVENOUS
  Administered 2023-12-03: 5.2 mg via INTRAVENOUS
  Administered 2023-12-03: 1.6 mg via INTRAVENOUS
  Administered 2023-12-03: 3.2 mg via INTRAVENOUS
  Administered 2023-12-04: 4.8 mg via INTRAVENOUS
  Administered 2023-12-04: 2.8 mL via INTRAVENOUS
  Administered 2023-12-04: 0.4 mg via INTRAVENOUS
  Administered 2023-12-04: 4 mg via INTRAVENOUS
  Administered 2023-12-04: 30 mg via INTRAVENOUS
  Administered 2023-12-04: 2.4 mg via INTRAVENOUS
  Administered 2023-12-04: 7.9 mg via INTRAVENOUS
  Administered 2023-12-05: 6 mg via INTRAVENOUS
  Administered 2023-12-05: 3.2 mg via INTRAVENOUS
  Administered 2023-12-05: 2.8 mg via INTRAVENOUS
  Administered 2023-12-05: 0.4 mg via INTRAVENOUS
  Administered 2023-12-05: 5.2 mg via INTRAVENOUS
  Administered 2023-12-06: 3.3 mg via INTRAVENOUS
  Administered 2023-12-06: 4.8 mg via INTRAVENOUS
  Administered 2023-12-06: 6.4 mg via INTRAVENOUS
  Administered 2023-12-06: 30 mg via INTRAVENOUS
  Administered 2023-12-06: 2 mg via INTRAVENOUS
  Administered 2023-12-06: 6.4 mg via INTRAVENOUS
  Administered 2023-12-06: 4.6 mg via INTRAVENOUS
  Administered 2023-12-07: 7.2 mg via INTRAVENOUS
  Administered 2023-12-07: 5.2 mg via INTRAVENOUS
  Administered 2023-12-07: 30 mg via INTRAVENOUS
  Administered 2023-12-07: 3.2 mg via INTRAVENOUS
  Administered 2023-12-07: 0.4 mg via INTRAVENOUS
  Administered 2023-12-07: 4.8 mg via INTRAVENOUS
  Administered 2023-12-08: 4 mg via INTRAVENOUS
  Administered 2023-12-08: 5.6 mg via INTRAVENOUS
  Administered 2023-12-08: 30 mg via INTRAVENOUS
  Administered 2023-12-08: 10 mg via INTRAVENOUS
  Administered 2023-12-08: 12.4 mg via INTRAVENOUS
  Filled 2023-12-02 (×6): qty 30

## 2023-12-02 MED ORDER — ONDANSETRON HCL 4 MG/2ML IJ SOLN
4.0000 mg | INTRAMUSCULAR | Status: DC | PRN
  Administered 2023-12-03 – 2023-12-07 (×15): 4 mg via INTRAVENOUS
  Filled 2023-12-02 (×15): qty 2

## 2023-12-02 MED ORDER — DEFERASIROX 360 MG PO TABS
360.0000 mg | ORAL_TABLET | Freq: Two times a day (BID) | ORAL | Status: DC
  Filled 2023-12-02: qty 1

## 2023-12-02 MED ORDER — ONDANSETRON HCL 4 MG PO TABS
4.0000 mg | ORAL_TABLET | ORAL | Status: DC | PRN
Start: 2023-12-02 — End: 2023-12-08

## 2023-12-02 MED ORDER — ONDANSETRON HCL 4 MG/2ML IJ SOLN
4.0000 mg | INTRAMUSCULAR | Status: AC
  Administered 2023-12-02: 4 mg via INTRAVENOUS
  Filled 2023-12-02: qty 2

## 2023-12-02 MED ORDER — OXYCODONE HCL 5 MG PO TABS
10.0000 mg | ORAL_TABLET | Freq: Four times a day (QID) | ORAL | Status: DC | PRN
  Administered 2023-12-04 – 2023-12-08 (×7): 10 mg via ORAL
  Filled 2023-12-02 (×7): qty 2

## 2023-12-02 MED ORDER — DIPHENHYDRAMINE HCL 25 MG PO CAPS
25.0000 mg | ORAL_CAPSULE | ORAL | Status: DC | PRN
Start: 2023-12-02 — End: 2023-12-08
  Administered 2023-12-04: 25 mg via ORAL
  Filled 2023-12-02: qty 1

## 2023-12-02 MED ORDER — PREGABALIN 75 MG PO CAPS
150.0000 mg | ORAL_CAPSULE | Freq: Three times a day (TID) | ORAL | Status: DC
  Administered 2023-12-02 – 2023-12-08 (×19): 150 mg via ORAL
  Filled 2023-12-02 (×20): qty 2

## 2023-12-02 MED ORDER — NALOXONE HCL 0.4 MG/ML IJ SOLN: 0.4000 mg | INTRAMUSCULAR | Status: DC | PRN

## 2023-12-02 MED ORDER — FLUTICASONE PROPIONATE 50 MCG/ACT NA SUSP: 1.0000 | Freq: Every day | NASAL | Status: DC | PRN

## 2023-12-02 MED ORDER — PANTOPRAZOLE SODIUM 40 MG PO TBEC
40.0000 mg | DELAYED_RELEASE_TABLET | Freq: Every day | ORAL | Status: DC
  Administered 2023-12-02 – 2023-12-08 (×7): 40 mg via ORAL
  Filled 2023-12-02 (×7): qty 1

## 2023-12-02 MED ORDER — HYDROMORPHONE HCL 1 MG/ML IJ SOLN
1.0000 mg | INTRAMUSCULAR | Status: AC
  Administered 2023-12-02: 1 mg via INTRAVENOUS
  Filled 2023-12-02: qty 1

## 2023-12-02 MED ORDER — SODIUM CHLORIDE 0.9% FLUSH: 9.0000 mL | INTRAVENOUS | Status: DC | PRN

## 2023-12-02 NOTE — ED Notes (Signed)
 Called [facility] spoke with Bailey Reese; Consult to transfer to [facility]. Waiting on Hospitalist to call back.

## 2023-12-02 NOTE — Plan of Care (Signed)

## 2023-12-02 NOTE — ED Notes (Signed)
 EMTALA reviewed by this RN.

## 2023-12-02 NOTE — H&P (Addendum)
 H&P  Patient Demographics:  Bailey Reese, is a 18 y.o. female  MRN: [id number]   DOB - November 10, 2005  Admit Date - 12/02/2023  Outpatient Primary MD for the patient is Little, Slater Pizza, MD   HPI:   Bailey Reese  is a 18 y.o. female with medical history significant for sickle cell disease, PTSD, major depression and generalized anxiety disorder who usually gets medical care at [facility] but in the process of transferring her care to [facility] health, presented to [facility] emergency room yesterday with major complaints of generalized bodyaches, fever, cough and mild chest pain with associated shortness of breath, nausea, vomiting and diarrhea.  Pain is typical of her sickle cell crisis.  Patient has been out of her home pain medications because she is in process of changing provider and has not had anyone prescribing her home medications.  She has tried buprenorphine  without success.  She was on oxycodone  but no longer been prescribed since she has no current PCP.  Patient has had multiple admissions in the past couple of months, she was recently discharged on 11/24/2023 having been admitted for the same complaints.  She has an individualized Plan from [facility], patient did not like this plan documented in care everywhere.  ED course: Patient was documented to be resting comfortably, no vomiting witnessed.  Her vital signs showed: Blood pressure 124/76 mmHg, pulse 80, respiratory rate 19, temperature 98.7 and saturating 100% on room air.  Patient was awake with no distress.  Comprehensive metabolic panel was essentially normal except for chronic total bilirubin of 4.8, not new.  High-sensitivity troponin I was normal.  WBC count was 13.5, better than previous numbers, hemoglobin at 8.4 also significantly better than her baseline of between 7 and 8, platelet count of 500, also showing robust reticulocytosis.  Respiratory panel was negative, group A strep by PCR was not detected, urinalysis was  negative for UTI.  Chest x-ray showed no acute cardiopulmonary disease with Upper normal cardiac size.  EKG showed normal sinus rhythm.  Patient received multiple doses of IV Dilaudid  and other adjunct therapies per sickle cell pain management protocol.  Patient reported no significant improvement in her pain.  Patient will be admitted to [facility] for further evaluation and ongoing management of sickle cell pain crisis   Review of systems:  In addition to the HPI above, patient reports No Headache, No changes with vision or hearing No problems swallowing food or liquids No blood in stool or urine No dysuria No new skin rashes or bruises No new joints pains-aches No new weakness, tingling, numbness in any extremity No recent weight gain or loss No polyuria, polydypsia or polyphagia No significant Mental Stressors  A full 10 point Review of Systems was done, except as stated above, all other Review of Systems were negative.  With Past History of the following :   Past Medical History:  Diagnosis Date   Anxiety    Depression    PTSD (post-traumatic stress disorder)    Sickle cell anemia (HCC)       Past Surgical History:  Procedure Laterality Date   BIOPSY  07/04/2023   Procedure: BIOPSY;  Surgeon: Saintclair Jasper, MD;  Location: [facility];  Service: Gastroenterology;;   CHOLECYSTECTOMY     ESOPHAGOGASTRODUODENOSCOPY (EGD) WITH PROPOFOL  N/A 07/04/2023   Procedure: ESOPHAGOGASTRODUODENOSCOPY (EGD) WITH PROPOFOL ;  Surgeon: Saintclair Jasper, MD;  Location: [facility];  Service: Gastroenterology;  Laterality: N/A;   WRIST SURGERY  Social History:   Social History   Tobacco Use   Smoking status: Never   Smokeless tobacco: Never  Substance Use Topics   Alcohol  use: No     Lives - At home   Family History :   Family History  Problem Relation Age of Onset   Diabetes Paternal Aunt      Home Medications:   Prior to Admission medications   Medication Sig Start  Date End Date Taking? Authorizing Provider  budesonide-formoterol  (SYMBICORT) 80-4.5 MCG/ACT inhaler Inhale 2 puffs into the lungs See admin instructions. Inhale 2 puffs by mouth twice daily and an extra puff as needed. 12/29/22 12/29/23  [provider]  buprenorphine  (BUTRANS ) 20 MCG/HR PTWK Place 1 patch onto the skin once a week.    [provider]  Calcium Carb-Cholecalciferol (CALCIUM 600+D3 PO) Take 1 tablet by mouth at bedtime.    [provider]  Deferasirox  360 MG TABS Take 360 mg by mouth 2 (two) times daily.    [provider]  ergocalciferol (VITAMIN D2) 1.25 MG (50000 UT) capsule Take 50,000 Units by mouth once a week.    [provider]  esomeprazole  (NEXIUM ) 40 MG capsule Take 1 capsule (40 mg total) by mouth daily as needed (reflux). 08/29/23   Ijaola, Onyeje M, NP  fexofenadine (ALLEGRA) 180 MG tablet Take 180 mg by mouth daily as needed for allergies. 12/29/22   [provider]  fluticasone  (FLONASE ) 50 MCG/ACT nasal spray Place 1 spray into both nostrils daily as needed for allergies or rhinitis. 12/29/22   [provider]  hydroxyurea  (HYDREA ) 500 MG capsule Take 500 mg by mouth 2 (two) times daily. May take with food to minimize GI side effects.    [provider]  hydrOXYzine  (ATARAX ) 25 MG tablet Take 25 mg by mouth See admin instructions. Take 25mg  (1 tablet) by mouth twice daily and an extra tablet if needed.    [provider]  lubiprostone  (AMITIZA ) 24 MCG capsule Take 24 mcg by mouth in the morning and at bedtime. 11/12/23   [provider]  ondansetron  (ZOFRAN -ODT) 4 MG disintegrating tablet Take 4 mg by mouth every 8 (eight) hours as needed for nausea or vomiting (dissolve orally).    [provider]  Oxycodone  HCl 10 MG TABS Take 1 tablet (10 mg total) by mouth every 6 (six) hours as needed (pain). 07/05/23   Sim Emery CROME, MD  pantoprazole  (PROTONIX ) 40 MG tablet Take 1  tablet (40 mg total) by mouth 2 (two) times daily. 07/05/23 07/04/24  Sim Emery CROME, MD  prazosin  (MINIPRESS ) 1 MG capsule Take 1 mg by mouth at bedtime.    [provider]  pregabalin  (LYRICA ) 150 MG capsule Take 150 mg by mouth 3 (three) times daily.    [provider]  prochlorperazine  (COMPAZINE ) 5 MG tablet Take 5 mg by mouth every 6 (six) hours as needed for nausea or vomiting.    [provider]  promethazine  (PHENERGAN ) 12.5 MG tablet Take 12.5 mg by mouth every 6 (six) hours as needed for nausea or vomiting. 05/10/23   [provider]     Allergies:   Allergies  Allergen Reactions   Cherry Anaphylaxis, Itching and Swelling   Olanzapine  Other (See Comments)    Drug-induced liver injury   Other Swelling and Other (See Comments)    Pitted fruits   Peanut-Containing Drug Products Anaphylaxis, Itching and Swelling   Plum Pulp Anaphylaxis and Hives   Droperidol  Other (  See Comments)    Pt states muscle tension, involuntary movements, eye drift, anxiety   Morphine  And Codeine Hives and Itching   Peach [Prunus Persica] Itching, Swelling and Other (See Comments)   Acetaminophen  Nausea And Vomiting   Compazine  [Prochlorperazine ] Other (See Comments) and Hypertension    Patient stated that this medication causes her HR to increase and well as BP. She also states that it cause hallucinations.   Ibuprofen  Nausea And Vomiting     Physical Exam:   Vitals:   Vitals:   12/02/23 0923 12/02/23 1157  BP: 113/63   Pulse: 64   Resp: 16 14  Temp: 98.4 F (36.9 C)   SpO2: 97%     Physical Exam: Constitutional: Patient appears well-developed and well-nourished. Not in obvious distress. HENT: Normocephalic, atraumatic, External right and left ear normal. Oropharynx is clear and moist.  Eyes: Conjunctivae and EOM are normal. PERRLA, no scleral icterus. Neck: Normal ROM. Neck supple. No JVD. No tracheal deviation. No thyromegaly. CVS: RRR, S1/S2 +,  no murmurs, no gallops, no carotid bruit.  Pulmonary: Effort and breath sounds normal, no stridor, rhonchi, wheezes, rales.  Abdominal: Soft. BS +, no distension, tenderness, rebound or guarding.  Musculoskeletal: Normal range of motion. No edema and no tenderness.  Lymphadenopathy: No lymphadenopathy noted, cervical, inguinal or axillary Neuro: Alert. Normal reflexes, muscle tone coordination. No cranial nerve deficit. Skin: Skin is warm and dry. No rash noted. Not diaphoretic. No erythema. No pallor. Psychiatric: Normal mood and affect. Behavior, judgment, thought content normal.   Data Review:   CBC Recent Labs  Lab 12/01/23 2047  WBC 13.5*  HGB 8.4*  HCT 23.6*  PLT 500*  MCV 97.1  MCH 34.6*  MCHC 35.6  RDW 20.0*  LYMPHSABS 3.7  MONOABS 2.0*  EOSABS 0.4  BASOSABS 0.1   ------------------------------------------------------------------------------------------------------------------  Chemistries  Recent Labs  Lab 12/01/23 2047  NA 138  K 4.0  CL 108  CO2 22  GLUCOSE 93  BUN 7  CREATININE 0.48  CALCIUM 9.2  AST 31  ALT 39  ALKPHOS 118  BILITOT 4.8*   ------------------------------------------------------------------------------------------------------------------ estimated creatinine clearance is 105.5 mL/min (by C-G formula based on SCr of 0.48 mg/dL). ------------------------------------------------------------------------------------------------------------------ No results for input(s): TSH, T4TOTAL, T3FREE, THYROIDAB in the last 72 hours.  Invalid input(s): FREET3  Coagulation profile No results for input(s): INR, PROTIME in the last 168 hours. ------------------------------------------------------------------------------------------------------------------- No results for input(s): DDIMER in the last 72 hours. -------------------------------------------------------------------------------------------------------------------  Cardiac  Enzymes No results for input(s): CKMB, TROPONINI, MYOGLOBIN in the last 168 hours.  Invalid input(s): CK ------------------------------------------------------------------------------------------------------------------    Component Value Date/Time   BNP 22.4 12/01/2023 2047    ---------------------------------------------------------------------------------------------------------------  Urinalysis    Component Value Date/Time   COLORURINE YELLOW (A) 12/01/2023 2047   APPEARANCEUR HAZY (A) 12/01/2023 2047   APPEARANCEUR Clear 05/01/2013 1440   LABSPEC 1.011 12/01/2023 2047   LABSPEC 1.016 05/01/2013 1440   PHURINE 7.0 12/01/2023 2047   GLUCOSEU NEGATIVE 12/01/2023 2047   GLUCOSEU Negative 05/01/2013 1440   HGBUR NEGATIVE 12/01/2023 2047   BILIRUBINUR NEGATIVE 12/01/2023 2047   BILIRUBINUR Negative 05/01/2013 1440   KETONESUR NEGATIVE 12/01/2023 2047   PROTEINUR NEGATIVE 12/01/2023 2047   NITRITE NEGATIVE 12/01/2023 2047   LEUKOCYTESUR NEGATIVE 12/01/2023 2047   LEUKOCYTESUR Negative 05/01/2013 1440    ----------------------------------------------------------------------------------------------------------------   Imaging Results:    DG Chest 2 View Result Date: 12/01/2023 CLINICAL DATA:  Chest pain shortness of breath and cough EXAM: CHEST - 2 VIEW COMPARISON:  11/23/2023  FINDINGS: Upper normal cardiac size. No acute airspace disease, pleural effusion or pneumothorax. IMPRESSION: No active cardiopulmonary disease.  Upper normal cardiac size. Electronically Signed   By: Luke Bun M.D.   On: 12/01/2023 21:08     Assessment & Plan:  Principal Problem:   Sickle cell crisis (HCC) Active Problems:   Chronic pain syndrome   GAD (generalized anxiety disorder)   Anemia of chronic disease   Hyperbilirubinemia  Hb Sickle Cell Disease with crisis: Admit patient, start IVF 0.45% Saline @ 125 mls/hour, start weight based Dilaudid  PCA, start IV Toradol  15 mg Q 6 H,  Restart oral home pain medications, Monitor vitals very closely, Re-evaluate pain scale regularly, 2 L of Oxygen by [place], Patient will be re-evaluated for pain in the context of function and relationship to baseline as care progresses. Leukocytosis: This is chronic, no objective fever there is no other sign or symptoms of infection or inflammation.  Will continue to manage without antibiotics.  Repeat labs in AM. Anemia of Chronic Disease: Hemoglobin slightly above her baseline.  There is no clinical indication for blood transfusion at this time.  Will continue to monitor closely and transfuse as appropriate. Chronic pain Syndrome: Continue home pain medications as ordered. Generalized anxiety disorder: Clinically stable.  Patient denies any suicidal ideations or thoughts.  Patient will continue her home medications.  She was counseled extensively. Mild intermittent asthma without complication: Clinically stable.  Continue home medications.  DVT Prophylaxis: Subcut Lovenox    AM Labs Ordered, also please review Full Orders  Family Communication: Admission, patient's condition and plan of care including tests being ordered have been discussed with the patient who indicate understanding and agree with the plan and Code Status.  Code Status: Full Code  Consults called: None    Admission status: Inpatient    Time spent in minutes : 50 minutes  Reilynn Lauro  12/02/2023 at 12:37 PM

## 2023-12-03 DIAGNOSIS — D5700 Hb-SS disease with crisis, unspecified: Secondary | ICD-10-CM | POA: Diagnosis not present

## 2023-12-03 LAB — CBC
HCT: 22.2 % — ABNORMAL LOW (ref 36.0–46.0)
Hemoglobin: 7.5 g/dL — ABNORMAL LOW (ref 12.0–15.0)
MCH: 34.4 pg — ABNORMAL HIGH (ref 26.0–34.0)
MCHC: 33.8 g/dL (ref 30.0–36.0)
MCV: 101.8 fL — ABNORMAL HIGH (ref 80.0–100.0)
Platelets: 474 K/uL — ABNORMAL HIGH (ref 150–400)
RBC: 2.18 MIL/uL — ABNORMAL LOW (ref 3.87–5.11)
RDW: 18.9 % — ABNORMAL HIGH (ref 11.5–15.5)
WBC: 11.7 K/uL — ABNORMAL HIGH (ref 4.0–10.5)
nRBC: 2.4 % — ABNORMAL HIGH (ref 0.0–0.2)

## 2023-12-03 NOTE — Progress Notes (Signed)
 Patient ID: Bailey Reese, female   DOB: 2005-12-03, 18 y.o.   MRN: [id number] Subjective: Bailey Reese  is a 18 y.o. female with medical history significant of sickle cell disease, PTSD, depression with anxiety who usually gets her care at [facility] but was seen at [facility] ER and admitted to [facility] for sickle cell pain crisis.   Patient has no new complaint this morning except that her pain continues to be significant.  Although she endorses mild improvement, she rated her pain at 8/10 this morning, mostly in her lower extremities and lower back.  She has been able to ambulate to the bathroom.  She denies any fever, cough, chest pain, shortness of breath, nausea, vomiting or diarrhea.  No urinary symptoms.  Objective:  Vital signs in last 24 hours:  Vitals:   12/03/23 0413 12/03/23 0732 12/03/23 1034 12/03/23 1207  BP:   119/63   Pulse:   89   Resp: 18 18 16 16   Temp:   99 F (37.2 C)   TempSrc:   Oral   SpO2:   98%     Intake/Output from previous day:   Intake/Output Summary (Last 24 hours) at 12/03/2023 1235 Last data filed at 12/03/2023 0415 Gross per 24 hour  Intake 720 ml  Output --  Net 720 ml    Physical Exam: General: Alert, awake, oriented x3, in no acute distress.  HEENT: /AT PEERL, EOMI Neck: Trachea midline,  no masses, no thyromegal,y no JVD, no carotid bruit OROPHARYNX:  Moist, No exudate/ erythema/lesions.  Heart: Regular rate and rhythm, without murmurs, rubs, gallops, PMI non-displaced, no heaves or thrills on palpation.  Lungs: Clear to auscultation, no wheezing or rhonchi noted. No increased vocal fremitus resonant to percussion  Abdomen: Soft, nontender, nondistended, positive bowel sounds, no masses no hepatosplenomegaly noted..  Neuro: No focal neurological deficits noted cranial nerves II through XII grossly intact. DTRs 2+ bilaterally upper and lower extremities. Strength 5 out of 5 in bilateral upper and lower  extremities. Musculoskeletal: Bilateral lower extremity tenderness to touch.   Psychiatric: Patient alert and oriented x3, good insight and cognition, good recent to remote recall. Lymph node survey: No cervical axillary or inguinal lymphadenopathy noted.  Lab Results:  Basic Metabolic Panel:    Component Value Date/Time   NA 138 12/01/2023 2047   NA 135 05/01/2013 1440   K 4.0 12/01/2023 2047   K 4.3 05/01/2013 1440   CL 108 12/01/2023 2047   CL 102 05/01/2013 1440   CO2 22 12/01/2023 2047   CO2 27 (H) 05/01/2013 1440   BUN 7 12/01/2023 2047   BUN 13 05/01/2013 1440   CREATININE 0.48 12/01/2023 2047   CREATININE 0.40 (L) 05/01/2013 1440   GLUCOSE 93 12/01/2023 2047   GLUCOSE 108 (H) 05/01/2013 1440   CALCIUM 9.2 12/01/2023 2047   CALCIUM 9.8 05/01/2013 1440   CBC:    Component Value Date/Time   WBC 11.7 (H) 12/03/2023 0619   HGB 7.5 (L) 12/03/2023 0619   HGB 6.6 (L) 05/01/2013 1440   HCT 22.2 (L) 12/03/2023 0619   HCT 20.1 (L) 05/01/2013 1440   PLT 474 (H) 12/03/2023 0619   PLT 468 (H) 05/01/2013 1440   MCV 101.8 (H) 12/03/2023 0619   MCV 89 05/01/2013 1440   NEUTROABS 7.3 12/01/2023 2047   NEUTROABS 7.9 05/30/2012 1958   LYMPHSABS 3.7 12/01/2023 2047   LYMPHSABS 5.0 05/30/2012 1958   MONOABS 2.0 (H) 12/01/2023 2047   MONOABS 1.6 (  H) 05/30/2012 1958   EOSABS 0.4 12/01/2023 2047   EOSABS 0.6 05/30/2012 1958   BASOSABS 0.1 12/01/2023 2047   BASOSABS 3 04/28/2013 1736   BASOSABS 0.2 (H) 05/30/2012 1958    Recent Results (from the past 240 hours)  Resp panel by RT-PCR (RSV, Flu A&B, Covid) Anterior Nasal Swab     Status: None   Collection Time: 12/01/23  8:47 PM   Specimen: Anterior Nasal Swab  Result Value Ref Range Status   SARS Coronavirus 2 by RT PCR NEGATIVE NEGATIVE Final    Comment: (NOTE) SARS-CoV-2 target nucleic acids are NOT DETECTED.  The SARS-CoV-2 RNA is generally detectable in upper respiratory specimens during the acute phase of infection.  The lowest concentration of SARS-CoV-2 viral copies this assay can detect is 138 copies/mL. A negative result does not preclude SARS-Cov-2 infection and should not be used as the sole basis for treatment or other patient management decisions. A negative result may occur with  improper specimen collection/handling, submission of specimen other than nasopharyngeal swab, presence of viral mutation(s) within the areas targeted by this assay, and inadequate number of viral copies(<138 copies/mL). A negative result must be combined with clinical observations, patient history, and epidemiological information. The expected result is Negative.  Fact Sheet for Patients:  BloggerCourse.com  Fact Sheet for Healthcare Providers:  SeriousBroker.it  This test is no t yet approved or cleared by the [place]  FDA and  has been authorized for detection and/or diagnosis of SARS-CoV-2 by FDA under an Emergency Use Authorization (EUA). This EUA will remain  in effect (meaning this test can be used) for the duration of the COVID-19 declaration under Section 564(b)(1) of the Act, 21 U.S.C.section 360bbb-3(b)(1), unless the authorization is terminated  or revoked sooner.       Influenza A by PCR NEGATIVE NEGATIVE Final   Influenza B by PCR NEGATIVE NEGATIVE Final    Comment: (NOTE) The Xpert Xpress SARS-CoV-2/FLU/RSV plus assay is intended as an aid in the diagnosis of influenza from Nasopharyngeal swab specimens and should not be used as a sole basis for treatment. Nasal washings and aspirates are unacceptable for Xpert Xpress SARS-CoV-2/FLU/RSV testing.  Fact Sheet for Patients: BloggerCourse.com  Fact Sheet for Healthcare Providers: SeriousBroker.it  This test is not yet approved or cleared by the [place]  FDA and has been authorized for detection and/or diagnosis of SARS-CoV-2 by FDA  under an Emergency Use Authorization (EUA). This EUA will remain in effect (meaning this test can be used) for the duration of the COVID-19 declaration under Section 564(b)(1) of the Act, 21 U.S.C. section 360bbb-3(b)(1), unless the authorization is terminated or revoked.     Resp Syncytial Virus by PCR NEGATIVE NEGATIVE Final    Comment: (NOTE) Fact Sheet for Patients: BloggerCourse.com  Fact Sheet for Healthcare Providers: SeriousBroker.it  This test is not yet approved or cleared by the [place]  FDA and has been authorized for detection and/or diagnosis of SARS-CoV-2 by FDA under an Emergency Use Authorization (EUA). This EUA will remain in effect (meaning this test can be used) for the duration of the COVID-19 declaration under Section 564(b)(1) of the Act, 21 U.S.C. section 360bbb-3(b)(1), unless the authorization is terminated or revoked.  Performed at [facility], [address] [place], [place] [address]   Group A Strep by PCR [facility] Only)     Status: None   Collection Time: 12/01/23  8:47 PM   Specimen: Throat; Sterile Swab  Result Value Ref Range Status  Group A Strep by PCR NOT DETECTED NOT DETECTED Final    Comment: Performed at [facility], [address] [place], [place] [address]    Studies/Results: DG Chest 2 View Result Date: 12/01/2023 CLINICAL DATA:  Chest pain shortness of breath and cough EXAM: CHEST - 2 VIEW COMPARISON:  11/23/2023 FINDINGS: Upper normal cardiac size. No acute airspace disease, pleural effusion or pneumothorax. IMPRESSION: No active cardiopulmonary disease.  Upper normal cardiac size. Electronically Signed   By: Luke Bun M.D.   On: 12/01/2023 21:08    Medications: Scheduled Meds:  Deferasirox   360 mg Oral BID   fluticasone  furoate-vilanterol  1 puff Inhalation Daily   HYDROmorphone    Intravenous Q4H   hydroxyurea   500 mg Oral BID WC   ketorolac   15 mg  Intravenous Q6H   lubiprostone   24 mcg Oral BID WC   pantoprazole   40 mg Oral Daily   pregabalin   150 mg Oral TID   senna-docusate  1 tablet Oral BID   Continuous Infusions: PRN Meds:.diphenhydrAMINE , fluticasone , naloxone  **AND** sodium chloride  flush, ondansetron  **OR** ondansetron  (ZOFRAN ) IV, oxyCODONE , polyethylene glycol  Consultants: None  Procedures: None  Antibiotics: None  Assessment/Plan: Principal Problem:   Sickle cell crisis (HCC) Active Problems:   Chronic pain syndrome   GAD (generalized anxiety disorder)   Anemia of chronic disease   Hyperbilirubinemia  Hb Sickle Cell Disease with Pain crisis: Reduce IVF to KVO, continue weight based Dilaudid  PCA at current dose setting, continue IV Toradol  15 mg Q 6 H for a total of 5 days, continue oral home pain medications as ordered. Monitor vitals very closely, Re-evaluate pain scale regularly, 2 L of Oxygen by [place]. Patient encouraged to ambulate on the hallway today.  Leukocytosis: Mild.  Most likely due to VOC.  Will continue to monitor closely without antibiotics. Anemia of Chronic Disease: Anemia continues to be stable at baseline.  There is no clinical indication for blood transfusion at this time.  Will continue to monitor closely and transfuse as appropriate. Chronic pain Syndrome: Continue oral home pain medications as ordered. Generalized anxiety disorder: Clinically stable.  Patient denies any suicidal ideations or thoughts.  Patient counseled extensively and will continue her home medications. Mild intermittent asthma without complication: Clinically stable.  No acute exacerbation today.  Continue Symbicort as ordered.  Code Status: Full Code Family Communication: N/A Disposition Plan: Not yet ready for discharge  Bailey Reese  If 7PM-7AM, please contact night-coverage.  12/03/2023, 12:35 PM  LOS: 1 day

## 2023-12-03 NOTE — Plan of Care (Signed)

## 2023-12-04 LAB — BASIC METABOLIC PANEL WITH GFR
Anion gap: 8 (ref 5–15)
BUN: 9 mg/dL (ref 6–20)
CO2: 22 mmol/L (ref 22–32)
Calcium: 9.1 mg/dL (ref 8.9–10.3)
Chloride: 106 mmol/L (ref 98–111)
Creatinine, Ser: 0.48 mg/dL (ref 0.44–1.00)
GFR, Estimated: >60 mL/min (ref >60)
Glucose, Bld: 98 mg/dL (ref 70–99)
Potassium: 3.8 mmol/L (ref 3.5–5.1)
Sodium: 136 mmol/L (ref 135–145)

## 2023-12-04 LAB — CBC
HCT: 20.3 % — ABNORMAL LOW (ref 36.0–46.0)
Hemoglobin: 7 g/dL — ABNORMAL LOW (ref 12.0–15.0)
MCH: 34.1 pg — ABNORMAL HIGH (ref 26.0–34.0)
MCHC: 34.5 g/dL (ref 30.0–36.0)
MCV: 99 fL (ref 80.0–100.0)
Platelets: 379 K/uL (ref 150–400)
RBC: 2.05 MIL/uL — ABNORMAL LOW (ref 3.87–5.11)
RDW: 18.2 % — ABNORMAL HIGH (ref 11.5–15.5)
WBC: 13.6 K/uL — ABNORMAL HIGH (ref 4.0–10.5)
nRBC: 1.3 % — ABNORMAL HIGH (ref 0.0–0.2)

## 2023-12-04 NOTE — TOC Initial Note (Signed)
 Transition of Care [facility]) - Initial/Assessment Note    Patient Details  Name: Bailey Reese MRN: [id number] Date of Birth: 02-14-2006  Transition of Care [facility]) CM/SW Contact:    Toy LITTIE Agar, RN Phone [phone]  12/04/2023, 4:00 PM  Clinical Narrative:                 TOC following patient with high risk for readmission. Patient is from home where they normally function independently. Patient normally receives care at  [facility]. Patient does have insurance with access to affordable medications. Normally follow up for care at [facility]. There are currently no TOC needs. TOC following   Expected Discharge Plan: Home/Self Care Barriers to Discharge: Continued Medical Work up   Patient Goals and CMS Choice Patient states their goals for this hospitalization and ongoing recovery are:: Wants pain to be better   Choice offered to / list presented to : NA      Expected Discharge Plan and Services In-house Referral: NA Discharge Planning Services: CM Consult Post Acute Care Choice: NA                   DME Arranged: N/A DME Agency: NA       HH Arranged: NA HH Agency: NA        Prior Living Arrangements/Services     Patient language and need for interpreter reviewed:: Yes Do you feel safe going back to the place where you live?: Yes      Need for Family Participation in Patient Care: No (Comment) Care giver support system in place?: Yes (comment) Current home services: DME (has walker and home O2) Criminal Activity/Legal Involvement Pertinent to Current Situation/Hospitalization: No - Comment as needed  Activities of Daily Living   ADL Screening (condition at time of admission) Independently performs ADLs?: Yes (appropriate for developmental age) Is the patient deaf or have difficulty hearing?: No Does the patient have difficulty seeing, even when wearing glasses/contacts?: No Does the patient have difficulty concentrating, remembering, or  making decisions?: No  Permission Sought/Granted Permission sought to share information with : Family Supports Permission granted to share information with : No              Emotional Assessment Appearance:: Appears stated age Attitude/Demeanor/Rapport: Gracious Affect (typically observed): Quiet Orientation: : Oriented to Place, Oriented to  Time, Oriented to Situation Alcohol  / Substance Use: Not Applicable Psych Involvement: No (comment)  Admission diagnosis:  Sickle cell anemia (HCC) [D57.1] Sickle cell crisis (HCC) [D57.00] Patient Active Problem List   Diagnosis Date Noted   Anemia of chronic disease 11/21/2023   Sickle cell anemia with pain (HCC) 08/21/2023   Avascular necrosis of hip, right (HCC) 07/23/2023   GI bleed 07/03/2023   Vasoocclusive sickle cell crisis (HCC) 07/02/2023   Sickle cell anemia with crisis (HCC) 05/28/2023   Chronic pain syndrome 05/28/2023   Leucocytosis 05/28/2023   Thrombocytosis 05/28/2023   Hyperbilirubinemia 05/08/2023   GAD (generalized anxiety disorder) 03/29/2023   Asthma 12/23/2022   Red blood cell antibody positive 09/24/2022   Obstructive sleep apnea syndrome 02/04/2022   Mild intermittent asthma without complication 02/04/2022   Environmental allergies 02/04/2022   Allergic rhinitis 08/12/2020   Vitamin D insufficiency 06/20/2018   Family dysfunction    Sickle cell crisis (HCC) 08/23/2017   Sickle cell pain crisis (HCC) 08/23/2017   PCP:  Morgan Slater Pizza, MD Pharmacy:   [id number] - [place] [address]), [place] - [address] [place] ([address]) [place] [address] Phone: [phone] Fax: [phone]     Social Drivers of Health (SDOH) Social History: SDOH Screenings   Food Insecurity: No Food Insecurity (12/02/2023)  Housing: Low Risk  (12/02/2023)  Transportation Needs: No Transportation Needs (12/02/2023)  Utilities: Not At Risk (12/02/2023)  Financial Resource Strain: Low Risk   (07/25/2023)   Received from [facility]  Physical Activity: Insufficiently Active (12/10/2021)   Received from [facility]  Social Connections: Socially Isolated (11/20/2023)  Stress: Stress Concern Present (12/10/2021)   Received from [facility]  Tobacco Use: Low Risk  (12/02/2023)   SDOH Interventions:     Readmission Risk Interventions    12/04/2023    3:56 PM 11/21/2023   12:30 PM 11/02/2023   10:18 AM  Readmission Risk Prevention Plan  Transportation Screening Complete Complete Complete  PCP or Specialist Appt within 3-5 Days   Complete  HRI or Home Care Consult   Complete  Social Work Consult for Recovery Care Planning/Counseling   Complete  Palliative Care Screening   Not Applicable  Medication Review Oceanographer) Complete Complete Complete  PCP or Specialist appointment within 3-5 days of discharge Complete Complete   HRI or Home Care Consult Complete Complete   SW Recovery Care/Counseling Consult Complete Complete   Palliative Care Screening Not Applicable Not Applicable   Skilled Nursing Facility  Not Applicable

## 2023-12-04 NOTE — Progress Notes (Signed)
 Patient ID: Bailey Reese, female   DOB: 2006/04/16, 18 y.o.   MRN: [id number] Subjective: Bailey Reese  is a 18 y.o. female with medical history significant of sickle cell disease, PTSD, depression with anxiety who usually gets her care at [facility] but was seen at [facility] ER for generalize body aches, mild chest pain. She is admitted to [facility] for sickle cell pain management.     Patient endorses pain today of 8/10 with no new concerns.  Denies fever, cough, headache, SOB.   Objective:  Vital signs in last 24 hours:  Vitals:   12/04/23 0432 12/04/23 0446 12/04/23 0724 12/04/23 0744  BP: 138/64   138/64  Pulse: (!) 107   100  Resp: 16 18 18 18   Temp: 98.9 F (37.2 C)   98.9 F (37.2 C)  TempSrc: Oral   Oral  SpO2: 97%     Weight:    65.8 kg  Height:    5' 4 (1.626 m)    Intake/Output from previous day:   Intake/Output Summary (Last 24 hours) at 12/04/2023 1101 Last data filed at 12/03/2023 2343 Gross per 24 hour  Intake 532.8 ml  Output --  Net 532.8 ml    Physical Exam: General: Alert, awake, oriented x3, in no acute distress.  HEENT: [place]/AT PEERL, EOMI Neck: Trachea midline,  no masses, no thyromegal,y no JVD, no carotid bruit OROPHARYNX:  Moist, No exudate/ erythema/lesions.  Heart: Regular rate and rhythm, without murmurs, rubs, gallops, PMI non-displaced, no heaves or thrills on palpation.  Lungs: Clear to auscultation, no wheezing or rhonchi noted. No increased vocal fremitus resonant to percussion  Abdomen: Soft, nontender, nondistended, positive bowel sounds, no masses no hepatosplenomegaly noted..  Neuro: No focal neurological deficits noted cranial nerves II through XII grossly intact. DTRs 2+ bilaterally upper and lower extremities. Strength 5 out of 5 in bilateral upper and lower extremities. Musculoskeletal: Lower back tenderness, generalize body tenderness.  Psychiatric: Patient alert and oriented x3, good insight and cognition,  good recent to remote recall. Lymph node survey: No cervical axillary or inguinal lymphadenopathy noted.  Lab Results:  Basic Metabolic Panel:    Component Value Date/Time   NA 136 12/04/2023 0555   NA 135 05/01/2013 1440   K 3.8 12/04/2023 0555   K 4.3 05/01/2013 1440   CL 106 12/04/2023 0555   CL 102 05/01/2013 1440   CO2 22 12/04/2023 0555   CO2 27 (H) 05/01/2013 1440   BUN 9 12/04/2023 0555   BUN 13 05/01/2013 1440   CREATININE 0.48 12/04/2023 0555   CREATININE 0.40 (L) 05/01/2013 1440   GLUCOSE 98 12/04/2023 0555   GLUCOSE 108 (H) 05/01/2013 1440   CALCIUM 9.1 12/04/2023 0555   CALCIUM 9.8 05/01/2013 1440   CBC:    Component Value Date/Time   WBC 13.6 (H) 12/04/2023 0555   HGB 7.0 (L) 12/04/2023 0555   HGB 6.6 (L) 05/01/2013 1440   HCT 20.3 (L) 12/04/2023 0555   HCT 20.1 (L) 05/01/2013 1440   PLT 379 12/04/2023 0555   PLT 468 (H) 05/01/2013 1440   MCV 99.0 12/04/2023 0555   MCV 89 05/01/2013 1440   NEUTROABS 7.3 12/01/2023 2047   NEUTROABS 7.9 05/30/2012 1958   LYMPHSABS 3.7 12/01/2023 2047   LYMPHSABS 5.0 05/30/2012 1958   MONOABS 2.0 (H) 12/01/2023 2047   MONOABS 1.6 (H) 05/30/2012 1958   EOSABS 0.4 12/01/2023 2047   EOSABS 0.6 05/30/2012 1958   BASOSABS 0.1 12/01/2023 2047  BASOSABS 3 04/28/2013 1736   BASOSABS 0.2 (H) 05/30/2012 1958    Recent Results (from the past 240 hours)  Resp panel by RT-PCR (RSV, Flu A&B, Covid) Anterior Nasal Swab     Status: None   Collection Time: 12/01/23  8:47 PM   Specimen: Anterior Nasal Swab  Result Value Ref Range Status   SARS Coronavirus 2 by RT PCR NEGATIVE NEGATIVE Final    Comment: (NOTE) SARS-CoV-2 target nucleic acids are NOT DETECTED.  The SARS-CoV-2 RNA is generally detectable in upper respiratory specimens during the acute phase of infection. The lowest concentration of SARS-CoV-2 viral copies this assay can detect is 138 copies/mL. A negative result does not preclude SARS-Cov-2 infection and should  not be used as the sole basis for treatment or other patient management decisions. A negative result may occur with  improper specimen collection/handling, submission of specimen other than nasopharyngeal swab, presence of viral mutation(s) within the areas targeted by this assay, and inadequate number of viral copies(<138 copies/mL). A negative result must be combined with clinical observations, patient history, and epidemiological information. The expected result is Negative.  Fact Sheet for Patients:  BloggerCourse.com  Fact Sheet for Healthcare Providers:  SeriousBroker.it  This test is no t yet approved or cleared by the [place]  FDA and  has been authorized for detection and/or diagnosis of SARS-CoV-2 by FDA under an Emergency Use Authorization (EUA). This EUA will remain  in effect (meaning this test can be used) for the duration of the COVID-19 declaration under Section 564(b)(1) of the Act, 21 U.S.C.section 360bbb-3(b)(1), unless the authorization is terminated  or revoked sooner.       Influenza A by PCR NEGATIVE NEGATIVE Final   Influenza B by PCR NEGATIVE NEGATIVE Final    Comment: (NOTE) The Xpert Xpress SARS-CoV-2/FLU/RSV plus assay is intended as an aid in the diagnosis of influenza from Nasopharyngeal swab specimens and should not be used as a sole basis for treatment. Nasal washings and aspirates are unacceptable for Xpert Xpress SARS-CoV-2/FLU/RSV testing.  Fact Sheet for Patients: BloggerCourse.com  Fact Sheet for Healthcare Providers: SeriousBroker.it  This test is not yet approved or cleared by the [place]  FDA and has been authorized for detection and/or diagnosis of SARS-CoV-2 by FDA under an Emergency Use Authorization (EUA). This EUA will remain in effect (meaning this test can be used) for the duration of the COVID-19 declaration under  Section 564(b)(1) of the Act, 21 U.S.C. section 360bbb-3(b)(1), unless the authorization is terminated or revoked.     Resp Syncytial Virus by PCR NEGATIVE NEGATIVE Final    Comment: (NOTE) Fact Sheet for Patients: BloggerCourse.com  Fact Sheet for Healthcare Providers: SeriousBroker.it  This test is not yet approved or cleared by the [place]  FDA and has been authorized for detection and/or diagnosis of SARS-CoV-2 by FDA under an Emergency Use Authorization (EUA). This EUA will remain in effect (meaning this test can be used) for the duration of the COVID-19 declaration under Section 564(b)(1) of the Act, 21 U.S.C. section 360bbb-3(b)(1), unless the authorization is terminated or revoked.  Performed at [facility], [address] [place], [place] [address]   Group A Strep by PCR [facility] Only)     Status: None   Collection Time: 12/01/23  8:47 PM   Specimen: Throat; Sterile Swab  Result Value Ref Range Status   Group A Strep by PCR NOT DETECTED NOT DETECTED Final    Comment: Performed at [facility], [address]  [place], [place] [address]    Studies/Results: No results found.    Medications: Scheduled Meds:  Deferasirox   360 mg Oral BID   fluticasone  furoate-vilanterol  1 puff Inhalation Daily   HYDROmorphone    Intravenous Q4H   hydroxyurea   500 mg Oral BID WC   ketorolac   15 mg Intravenous Q6H   lubiprostone   24 mcg Oral BID WC   pantoprazole   40 mg Oral Daily   pregabalin   150 mg Oral TID   senna-docusate  1 tablet Oral BID   Continuous Infusions: PRN Meds:.diphenhydrAMINE , fluticasone , naloxone  **AND** sodium chloride  flush, ondansetron  **OR** ondansetron  (ZOFRAN ) IV, oxyCODONE , polyethylene glycol  Consultants: None   Procedures: None  Antibiotics: None  Assessment/Plan: Principal Problem:   Sickle cell crisis (HCC) Active Problems:   Chronic pain syndrome   GAD  (generalized anxiety disorder)   Anemia of chronic disease   Hyperbilirubinemia   Hb Sickle Cell Disease with Pain crisis: Continue IVF 0.45% Saline @ KVO continue weight based Dilaudid  PCA, continue oral home pain medications as ordered. Monitor vitals very closely, Re-evaluate pain scale regularly, 2 L of Oxygen by [place]. Patient encouraged to ambulate on the hallway today.  Leukocytosis: elevated most likely, no acute signs or symptoms of infection.  Will continue to monitor closely without antibiotics at this time.  Anemia of Chronic Disease: Hemoglobin is at baseline of 7.0g/dl. Will continue to monitor closely and transfuse as appropriate  Chronic pain Syndrome: continue oral home pain medication as ordered.  Mild intermittent asthma without complication: Stable, no acute excerebration. Continue Symbicort as needed  GAD: Stable, continue medication as prescribed  Code Status: Full Code Family Communication: N/A Disposition Plan: ready for discharge in the AM  [name] Cover NP   If 7PM-7AM, please contact night-coverage.  12/04/2023, 11:01 AM  LOS: 2 days

## 2023-12-05 NOTE — Progress Notes (Signed)
 Patient ID: Bailey Reese, female   DOB: 04-Feb-2006, 18 y.o.   MRN: [id number] Subjective: Bailey Reese  is a 18 y.o. female with medical history significant of sickle cell disease, PTSD, depression with anxiety who usually gets her care at [facility] but was seen at [facility] ER for generalize body aches, mild chest pain. She is admitted to [facility] for sickle cell pain management.     Patient endorses pain today of 7/10, slight improvement from yesterday.  She works slight nausea well-controlled with Zofran .  Denies fever, cough, headache, SOB.  No urinary symptoms.  Objective:  Vital signs in last 24 hours:  Vitals:   12/05/23 0858 12/05/23 0927 12/05/23 0936 12/05/23 1240  BP:  (!) 138/96    Pulse:  (!) 111 100   Resp:  20  20  Temp:  98.6 F (37 C)    TempSrc:  Oral    SpO2: 95% 99%    Weight:      Height:        Intake/Output from previous day:  No intake or output data in the 24 hours ending 12/05/23 1329   Physical Exam: General: Alert, awake, oriented x3, in no acute distress.  HEENT: [place]/AT PEERL, EOMI Neck: Trachea midline,  no masses, no thyromegal,y no JVD, no carotid bruit OROPHARYNX:  Moist, No exudate/ erythema/lesions.  Heart: Regular rate and rhythm, without murmurs, rubs, gallops, PMI non-displaced, no heaves or thrills on palpation.  Lungs: Clear to auscultation, no wheezing or rhonchi noted. No increased vocal fremitus resonant to percussion  Abdomen: Soft, nontender, nondistended, positive bowel sounds, no masses no hepatosplenomegaly noted..  Neuro: No focal neurological deficits noted cranial nerves II through XII grossly intact. DTRs 2+ bilaterally upper and lower extremities. Strength 5 out of 5 in bilateral upper and lower extremities. Musculoskeletal: Lower back tenderness, generalize body tenderness.  Psychiatric: Patient alert and oriented x3, good insight and cognition, good recent to remote recall. Lymph node survey: No  cervical axillary or inguinal lymphadenopathy noted.  Lab Results:  Basic Metabolic Panel:    Component Value Date/Time   NA 136 12/04/2023 0555   NA 135 05/01/2013 1440   K 3.8 12/04/2023 0555   K 4.3 05/01/2013 1440   CL 106 12/04/2023 0555   CL 102 05/01/2013 1440   CO2 22 12/04/2023 0555   CO2 27 (H) 05/01/2013 1440   BUN 9 12/04/2023 0555   BUN 13 05/01/2013 1440   CREATININE 0.48 12/04/2023 0555   CREATININE 0.40 (L) 05/01/2013 1440   GLUCOSE 98 12/04/2023 0555   GLUCOSE 108 (H) 05/01/2013 1440   CALCIUM 9.1 12/04/2023 0555   CALCIUM 9.8 05/01/2013 1440   CBC:    Component Value Date/Time   WBC 13.6 (H) 12/04/2023 0555   HGB 7.0 (L) 12/04/2023 0555   HGB 6.6 (L) 05/01/2013 1440   HCT 20.3 (L) 12/04/2023 0555   HCT 20.1 (L) 05/01/2013 1440   PLT 379 12/04/2023 0555   PLT 468 (H) 05/01/2013 1440   MCV 99.0 12/04/2023 0555   MCV 89 05/01/2013 1440   NEUTROABS 7.3 12/01/2023 2047   NEUTROABS 7.9 05/30/2012 1958   LYMPHSABS 3.7 12/01/2023 2047   LYMPHSABS 5.0 05/30/2012 1958   MONOABS 2.0 (H) 12/01/2023 2047   MONOABS 1.6 (H) 05/30/2012 1958   EOSABS 0.4 12/01/2023 2047   EOSABS 0.6 05/30/2012 1958   BASOSABS 0.1 12/01/2023 2047   BASOSABS 3 04/28/2013 1736   BASOSABS 0.2 (H) 05/30/2012 1958  Recent Results (from the past 240 hours)  Resp panel by RT-PCR (RSV, Flu A&B, Covid) Anterior Nasal Swab     Status: None   Collection Time: 12/01/23  8:47 PM   Specimen: Anterior Nasal Swab  Result Value Ref Range Status   SARS Coronavirus 2 by RT PCR NEGATIVE NEGATIVE Final    Comment: (NOTE) SARS-CoV-2 target nucleic acids are NOT DETECTED.  The SARS-CoV-2 RNA is generally detectable in upper respiratory specimens during the acute phase of infection. The lowest concentration of SARS-CoV-2 viral copies this assay can detect is 138 copies/mL. A negative result does not preclude SARS-Cov-2 infection and should not be used as the sole basis for treatment or other  patient management decisions. A negative result may occur with  improper specimen collection/handling, submission of specimen other than nasopharyngeal swab, presence of viral mutation(s) within the areas targeted by this assay, and inadequate number of viral copies(<138 copies/mL). A negative result must be combined with clinical observations, patient history, and epidemiological information. The expected result is Negative.  Fact Sheet for Patients:  BloggerCourse.com  Fact Sheet for Healthcare Providers:  SeriousBroker.it  This test is no t yet approved or cleared by the [place]  FDA and  has been authorized for detection and/or diagnosis of SARS-CoV-2 by FDA under an Emergency Use Authorization (EUA). This EUA will remain  in effect (meaning this test can be used) for the duration of the COVID-19 declaration under Section 564(b)(1) of the Act, 21 U.S.C.section 360bbb-3(b)(1), unless the authorization is terminated  or revoked sooner.       Influenza A by PCR NEGATIVE NEGATIVE Final   Influenza B by PCR NEGATIVE NEGATIVE Final    Comment: (NOTE) The Xpert Xpress SARS-CoV-2/FLU/RSV plus assay is intended as an aid in the diagnosis of influenza from Nasopharyngeal swab specimens and should not be used as a sole basis for treatment. Nasal washings and aspirates are unacceptable for Xpert Xpress SARS-CoV-2/FLU/RSV testing.  Fact Sheet for Patients: BloggerCourse.com  Fact Sheet for Healthcare Providers: SeriousBroker.it  This test is not yet approved or cleared by the [place]  FDA and has been authorized for detection and/or diagnosis of SARS-CoV-2 by FDA under an Emergency Use Authorization (EUA). This EUA will remain in effect (meaning this test can be used) for the duration of the COVID-19 declaration under Section 564(b)(1) of the Act, 21 U.S.C. section  360bbb-3(b)(1), unless the authorization is terminated or revoked.     Resp Syncytial Virus by PCR NEGATIVE NEGATIVE Final    Comment: (NOTE) Fact Sheet for Patients: BloggerCourse.com  Fact Sheet for Healthcare Providers: SeriousBroker.it  This test is not yet approved or cleared by the [place]  FDA and has been authorized for detection and/or diagnosis of SARS-CoV-2 by FDA under an Emergency Use Authorization (EUA). This EUA will remain in effect (meaning this test can be used) for the duration of the COVID-19 declaration under Section 564(b)(1) of the Act, 21 U.S.C. section 360bbb-3(b)(1), unless the authorization is terminated or revoked.  Performed at [facility], [address] [place], [place] [address]   Group A Strep by PCR [facility] Only)     Status: None   Collection Time: 12/01/23  8:47 PM   Specimen: Throat; Sterile Swab  Result Value Ref Range Status   Group A Strep by PCR NOT DETECTED NOT DETECTED Final    Comment: Performed at [facility], [address] [place], [place] [address]    Studies/Results: No results found.    Medications:  Scheduled Meds:  Deferasirox   360 mg Oral BID   fluticasone  furoate-vilanterol  1 puff Inhalation Daily   HYDROmorphone    Intravenous Q4H   hydroxyurea   500 mg Oral BID WC   ketorolac   15 mg Intravenous Q6H   lubiprostone   24 mcg Oral BID WC   pantoprazole   40 mg Oral Daily   pregabalin   150 mg Oral TID   senna-docusate  1 tablet Oral BID   Continuous Infusions: PRN Meds:.diphenhydrAMINE , fluticasone , naloxone  **AND** sodium chloride  flush, ondansetron  **OR** ondansetron  (ZOFRAN ) IV, oxyCODONE , polyethylene glycol  Consultants: None   Procedures: None  Antibiotics: None  Assessment/Plan: Principal Problem:   Sickle cell crisis (HCC) Active Problems:   Chronic pain syndrome   GAD (generalized anxiety disorder)   Anemia of chronic  disease   Hyperbilirubinemia   Hb Sickle Cell Disease with Pain crisis: Continue IVF 0.45% Saline @ KVO continue weight based Dilaudid  PCA, continue oral home pain medications as ordered. Monitor vitals very closely, Re-evaluate pain scale regularly, 2 L of Oxygen by [place]. Patient encouraged to ambulate on the hallway today.  Leukocytosis: elevated most likely, no acute signs or symptoms of infection.  Will continue to monitor closely without antibiotics at this time.  Anemia of Chronic Disease: Hemoglobin is at baseline of 7.0g/dl (pending lab results from today). Will continue to monitor closely and transfuse as appropriate  Chronic pain Syndrome: continue oral home pain medication as ordered.  Mild intermittent asthma without complication: Stable, no acute excerebration. Continue Symbicort as needed  GAD: Stable, continue medication as prescribed  Code Status: Full Code Family Communication: N/A Disposition Plan: ready for discharge in the AM  [name] Cover NP   If 7PM-7AM, please contact night-coverage.  12/05/2023, 1:29 PM  LOS: 3 days

## 2023-12-05 NOTE — Plan of Care (Signed)

## 2023-12-06 LAB — CBC
HCT: 20.3 % — ABNORMAL LOW (ref 36.0–46.0)
Hemoglobin: 6.9 g/dL — CL (ref 12.0–15.0)
MCH: 34.7 pg — ABNORMAL HIGH (ref 26.0–34.0)
MCHC: 34 g/dL (ref 30.0–36.0)
MCV: 102 fL — ABNORMAL HIGH (ref 80.0–100.0)
Platelets: 422 K/uL — ABNORMAL HIGH (ref 150–400)
RBC: 1.99 MIL/uL — ABNORMAL LOW (ref 3.87–5.11)
RDW: 18.7 % — ABNORMAL HIGH (ref 11.5–15.5)
WBC: 22 K/uL — ABNORMAL HIGH (ref 4.0–10.5)
nRBC: 1.3 % — ABNORMAL HIGH (ref 0.0–0.2)

## 2023-12-06 MED ORDER — SODIUM CHLORIDE 0.45 % IV SOLN: INTRAVENOUS | Status: AC

## 2023-12-06 NOTE — Progress Notes (Signed)
 Patient ID: JIMIA GENTLES, female   DOB: Feb 21, 2006, 18 y.o.   MRN: [id number] Subjective: Haidy Kackley  is a 18 y.o. female with medical history significant of sickle cell disease, PTSD, depression with anxiety who usually gets her care at [facility] but was seen at [facility] for generalize body aches, mild chest pain. She is admitted to [facility] for sickle cell pain management.     Patient endorses unchanged pain today of 7/10.  Nausea well-controlled with Zofran .  Denies fever, cough, headache, SOB.  No urinary symptoms.  Objective:  Vital signs in last 24 hours:  Vitals:   12/06/23 0822 12/06/23 1003 12/06/23 1124 12/06/23 1354  BP:  111/61  (!) 96/56  Pulse:  (!) 111  (!) 105  Resp: 18 18 18 18   Temp:  98.6 F (37 C)  98.3 F (36.8 C)  TempSrc:  Oral  Oral  SpO2:  98%  94%  Weight:      Height:        Intake/Output from previous day:  No intake or output data in the 24 hours ending 12/06/23 1521   Physical Exam: General: Alert, awake, oriented x3, in no acute distress.  HEENT: [place]/AT PEERL, EOMI Neck: Trachea midline,  no masses, no thyromegal,y no JVD, no carotid bruit OROPHARYNX:  Moist, No exudate/ erythema/lesions.  Heart: Regular rate and rhythm, without murmurs, rubs, gallops, PMI non-displaced, no heaves or thrills on palpation.  Lungs: Clear to auscultation, no wheezing or rhonchi noted. No increased vocal fremitus resonant to percussion  Abdomen: Soft, nontender, nondistended, positive bowel sounds, no masses no hepatosplenomegaly noted..  Neuro: No focal neurological deficits noted cranial nerves II through XII grossly intact. DTRs 2+ bilaterally upper and lower extremities. Strength 5 out of 5 in bilateral upper and lower extremities. Musculoskeletal: Lower back tenderness, generalize body tenderness.  Psychiatric: Patient alert and oriented x3, good insight and cognition, good recent to remote recall. Lymph node survey: No cervical  axillary or inguinal lymphadenopathy noted.  Lab Results:  Basic Metabolic Panel:    Component Value Date/Time   NA 136 12/04/2023 0555   NA 135 05/01/2013 1440   K 3.8 12/04/2023 0555   K 4.3 05/01/2013 1440   CL 106 12/04/2023 0555   CL 102 05/01/2013 1440   CO2 22 12/04/2023 0555   CO2 27 (H) 05/01/2013 1440   BUN 9 12/04/2023 0555   BUN 13 05/01/2013 1440   CREATININE 0.48 12/04/2023 0555   CREATININE 0.40 (L) 05/01/2013 1440   GLUCOSE 98 12/04/2023 0555   GLUCOSE 108 (H) 05/01/2013 1440   CALCIUM 9.1 12/04/2023 0555   CALCIUM 9.8 05/01/2013 1440   CBC:    Component Value Date/Time   WBC 22.0 (H) 12/06/2023 0621   HGB 6.9 (LL) 12/06/2023 0621   HGB 6.6 (L) 05/01/2013 1440   HCT 20.3 (L) 12/06/2023 0621   HCT 20.1 (L) 05/01/2013 1440   PLT 422 (H) 12/06/2023 0621   PLT 468 (H) 05/01/2013 1440   MCV 102.0 (H) 12/06/2023 0621   MCV 89 05/01/2013 1440   NEUTROABS 7.3 12/01/2023 2047   NEUTROABS 7.9 05/30/2012 1958   LYMPHSABS 3.7 12/01/2023 2047   LYMPHSABS 5.0 05/30/2012 1958   MONOABS 2.0 (H) 12/01/2023 2047   MONOABS 1.6 (H) 05/30/2012 1958   EOSABS 0.4 12/01/2023 2047   EOSABS 0.6 05/30/2012 1958   BASOSABS 0.1 12/01/2023 2047   BASOSABS 3 04/28/2013 1736   BASOSABS 0.2 (H) 05/30/2012 1958  Recent Results (from the past 240 hours)  Resp panel by RT-PCR (RSV, Flu A&B, Covid) Anterior Nasal Swab     Status: None   Collection Time: 12/01/23  8:47 PM   Specimen: Anterior Nasal Swab  Result Value Ref Range Status   SARS Coronavirus 2 by RT PCR NEGATIVE NEGATIVE Final    Comment: (NOTE) SARS-CoV-2 target nucleic acids are NOT DETECTED.  The SARS-CoV-2 RNA is generally detectable in upper respiratory specimens during the acute phase of infection. The lowest concentration of SARS-CoV-2 viral copies this assay can detect is 138 copies/mL. A negative result does not preclude SARS-Cov-2 infection and should not be used as the sole basis for treatment or other  patient management decisions. A negative result may occur with  improper specimen collection/handling, submission of specimen other than nasopharyngeal swab, presence of viral mutation(s) within the areas targeted by this assay, and inadequate number of viral copies(<138 copies/mL). A negative result must be combined with clinical observations, patient history, and epidemiological information. The expected result is Negative.  Fact Sheet for Patients:  BloggerCourse.com  Fact Sheet for Healthcare Providers:  SeriousBroker.it  This test is no t yet approved or cleared by the [place]  FDA and  has been authorized for detection and/or diagnosis of SARS-CoV-2 by FDA under an Emergency Use Authorization (EUA). This EUA will remain  in effect (meaning this test can be used) for the duration of the COVID-19 declaration under Section 564(b)(1) of the Act, 21 U.S.C.section 360bbb-3(b)(1), unless the authorization is terminated  or revoked sooner.       Influenza A by PCR NEGATIVE NEGATIVE Final   Influenza B by PCR NEGATIVE NEGATIVE Final    Comment: (NOTE) The Xpert Xpress SARS-CoV-2/FLU/RSV plus assay is intended as an aid in the diagnosis of influenza from Nasopharyngeal swab specimens and should not be used as a sole basis for treatment. Nasal washings and aspirates are unacceptable for Xpert Xpress SARS-CoV-2/FLU/RSV testing.  Fact Sheet for Patients: BloggerCourse.com  Fact Sheet for Healthcare Providers: SeriousBroker.it  This test is not yet approved or cleared by the [place]  FDA and has been authorized for detection and/or diagnosis of SARS-CoV-2 by FDA under an Emergency Use Authorization (EUA). This EUA will remain in effect (meaning this test can be used) for the duration of the COVID-19 declaration under Section 564(b)(1) of the Act, 21 U.S.C. section  360bbb-3(b)(1), unless the authorization is terminated or revoked.     Resp Syncytial Virus by PCR NEGATIVE NEGATIVE Final    Comment: (NOTE) Fact Sheet for Patients: BloggerCourse.com  Fact Sheet for Healthcare Providers: SeriousBroker.it  This test is not yet approved or cleared by the [place]  FDA and has been authorized for detection and/or diagnosis of SARS-CoV-2 by FDA under an Emergency Use Authorization (EUA). This EUA will remain in effect (meaning this test can be used) for the duration of the COVID-19 declaration under Section 564(b)(1) of the Act, 21 U.S.C. section 360bbb-3(b)(1), unless the authorization is terminated or revoked.  Performed at [facility], [address] [place], [place] [address]   Group A Strep by PCR [facility] Only)     Status: None   Collection Time: 12/01/23  8:47 PM   Specimen: Throat; Sterile Swab  Result Value Ref Range Status   Group A Strep by PCR NOT DETECTED NOT DETECTED Final    Comment: Performed at [facility], [address] [place], [place] [address]    Studies/Results: No results found.    Medications:  Scheduled Meds:  Deferasirox   360 mg Oral BID   fluticasone  furoate-vilanterol  1 puff Inhalation Daily   HYDROmorphone    Intravenous Q4H   hydroxyurea   500 mg Oral BID WC   ketorolac   15 mg Intravenous Q6H   lubiprostone   24 mcg Oral BID WC   pantoprazole   40 mg Oral Daily   pregabalin   150 mg Oral TID   senna-docusate  1 tablet Oral BID   Continuous Infusions:  sodium chloride      PRN Meds:.diphenhydrAMINE , fluticasone , naloxone  **AND** sodium chloride  flush, ondansetron  **OR** ondansetron  (ZOFRAN ) IV, oxyCODONE , polyethylene glycol  Consultants: None   Procedures: None  Antibiotics: None  Assessment/Plan: Principal Problem:   Sickle cell crisis (HCC) Active Problems:   Chronic pain syndrome   GAD (generalized anxiety disorder)    Anemia of chronic disease   Hyperbilirubinemia   Hb Sickle Cell Disease with Pain crisis: Continue hydration IVF 0.45% Saline at 75 mL/h continue weight based Dilaudid  PCA, continue oral home pain medications as ordered. Monitor vitals very closely, Re-evaluate pain scale regularly, 2 L of Oxygen by [place]. Patient encouraged to ambulate on the hallway today.  Leukocytosis: elevated in the last 24 hours with elevated heart rate however no fevers, no acute signs or symptoms of infection, WBC may be elevated due dehydration, CMP orders placed, gentle hydration started.  Will continue to monitor closely without antibiotics at this time.  Anemia of Chronic Disease: Hemoglobin is lower than patients baseline of 6.9 g/dl (pending lab results from today). Will continue to monitor closely and transfuse as appropriate  Chronic pain Syndrome: continue oral home pain medication as ordered.  Mild intermittent asthma without complication: Stable, no acute excerebration. Continue Symbicort as needed  GAD: Stable, continue medication as prescribed  Code Status: Full Code Family Communication: N/A Disposition Plan: ready for discharge in the AM  [name] Cover NP   If 7PM-7AM, please contact night-coverage.  12/06/2023, 3:21 PM  LOS: 4 days

## 2023-12-06 NOTE — Plan of Care (Signed)

## 2023-12-07 LAB — CBC
HCT: 20.6 % — ABNORMAL LOW (ref 36.0–46.0)
Hemoglobin: 6.7 g/dL — CL (ref 12.0–15.0)
MCH: 34.4 pg — ABNORMAL HIGH (ref 26.0–34.0)
MCHC: 32.5 g/dL (ref 30.0–36.0)
MCV: 105.6 fL — ABNORMAL HIGH (ref 80.0–100.0)
Platelets: 442 K/uL — ABNORMAL HIGH (ref 150–400)
RBC: 1.95 MIL/uL — ABNORMAL LOW (ref 3.87–5.11)
RDW: 21.5 % — ABNORMAL HIGH (ref 11.5–15.5)
WBC: 16.5 K/uL — ABNORMAL HIGH (ref 4.0–10.5)
nRBC: 3.4 % — ABNORMAL HIGH (ref 0.0–0.2)

## 2023-12-07 LAB — COMPREHENSIVE METABOLIC PANEL WITH GFR
ALT: 58 U/L — ABNORMAL HIGH (ref 0–44)
AST: 42 U/L — ABNORMAL HIGH (ref 15–41)
Albumin: 3.3 g/dL — ABNORMAL LOW (ref 3.5–5.0)
Alkaline Phosphatase: 120 U/L (ref 38–126)
Anion gap: 6 (ref 5–15)
BUN: 8 mg/dL (ref 6–20)
CO2: 24 mmol/L (ref 22–32)
Calcium: 8.9 mg/dL (ref 8.9–10.3)
Chloride: 106 mmol/L (ref 98–111)
Creatinine, Ser: 0.55 mg/dL (ref 0.44–1.00)
GFR, Estimated: >60 mL/min (ref >60)
Glucose, Bld: 104 mg/dL — ABNORMAL HIGH (ref 70–99)
Potassium: 4.1 mmol/L (ref 3.5–5.1)
Sodium: 136 mmol/L (ref 135–145)
Total Bilirubin: 5.1 mg/dL — ABNORMAL HIGH (ref 0.0–1.2)
Total Protein: 7.1 g/dL (ref 6.5–8.1)

## 2023-12-07 LAB — RETICULOCYTES
Immature Retic Fract: 44.9 % — ABNORMAL HIGH (ref 2.3–15.9)
RBC.: 2.7 MIL/uL — ABNORMAL LOW (ref 3.87–5.11)
Retic Count, Absolute: 487.1 K/uL — ABNORMAL HIGH (ref 19.0–186.0)
Retic Ct Pct: 18 % — ABNORMAL HIGH (ref 0.4–3.1)

## 2023-12-07 LAB — PREPARE RBC (CROSSMATCH)

## 2023-12-07 LAB — LACTATE DEHYDROGENASE: LDH: 234 U/L — ABNORMAL HIGH (ref 98–192)

## 2023-12-07 MED ORDER — SODIUM CHLORIDE 0.9% IV SOLUTION: Freq: Once | INTRAVENOUS | Status: AC

## 2023-12-07 MED ORDER — DIPHENHYDRAMINE HCL 25 MG PO CAPS
25.0000 mg | ORAL_CAPSULE | Freq: Once | ORAL | Status: AC
  Administered 2023-12-07: 25 mg via ORAL
  Filled 2023-12-07: qty 1

## 2023-12-07 NOTE — Progress Notes (Signed)
 PT undergoing procedure at this time- RT unable to deliver scheduled treatment. Will attempt again later this shift.

## 2023-12-07 NOTE — Plan of Care (Signed)

## 2023-12-07 NOTE — Progress Notes (Addendum)
 Patient ID: Bailey Reese, female   DOB: April 18, 2006, 18 y.o.   MRN: [id number] Subjective: Bailey Reese  is a 18 y.o. female with medical history significant of sickle cell disease, PTSD, depression with anxiety who usually gets her care at [facility] but was seen at [facility] ER for generalize body aches, mild chest pain. She is admitted to [facility] for sickle cell pain management.     Patient was up and excited to see me this morning, she was having conversations and very upbeat.  She is reporting improved pain of  6/10.  Nausea well-controlled with Zofran .  Denies fever, cough, headache, SOB.  No urinary symptoms.  Objective:  Vital signs in last 24 hours:  Vitals:   12/07/23 1323 12/07/23 1422 12/07/23 1440 12/07/23 1545  BP: 110/60 108/68 102/61   Pulse: 93 100 98   Resp: 20 12 18 18   Temp: 98.7 F (37.1 C) 98.7 F (37.1 C) 98 F (36.7 C)   TempSrc: Oral Oral Oral   SpO2: 96% 95% 96%   Weight:      Height:        Intake/Output from previous day:   Intake/Output Summary (Last 24 hours) at 12/07/2023 1600 Last data filed at 12/07/2023 1533 Gross per 24 hour  Intake 1670.19 ml  Output --  Net 1670.19 ml     Physical Exam: General: Alert, awake, oriented x3, in no acute distress.  HEENT: [place]/AT PEERL, EOMI Neck: Trachea midline,  no masses, no thyromegal,y no JVD, no carotid bruit OROPHARYNX:  Moist, No exudate/ erythema/lesions.  Heart: Regular rate and rhythm, without murmurs, rubs, gallops, PMI non-displaced, no heaves or thrills on palpation.  Lungs: Clear to auscultation, no wheezing or rhonchi noted. No increased vocal fremitus resonant to percussion  Abdomen: Soft, nontender, nondistended, positive bowel sounds, no masses no hepatosplenomegaly noted..  Neuro: No focal neurological deficits noted cranial nerves II through XII grossly intact. DTRs 2+ bilaterally upper and lower extremities. Strength 5 out of 5 in bilateral upper and lower  extremities. Musculoskeletal: Lower back tenderness, generalize body tenderness.  Psychiatric: Patient alert and oriented x3, good insight and cognition, good recent to remote recall. Lymph node survey: No cervical axillary or inguinal lymphadenopathy noted.  Lab Results:  Basic Metabolic Panel:    Component Value Date/Time   NA 136 12/07/2023 0613   NA 135 05/01/2013 1440   K 4.1 12/07/2023 0613   K 4.3 05/01/2013 1440   CL 106 12/07/2023 0613   CL 102 05/01/2013 1440   CO2 24 12/07/2023 0613   CO2 27 (H) 05/01/2013 1440   BUN 8 12/07/2023 0613   BUN 13 05/01/2013 1440   CREATININE 0.55 12/07/2023 0613   CREATININE 0.40 (L) 05/01/2013 1440   GLUCOSE 104 (H) 12/07/2023 0613   GLUCOSE 108 (H) 05/01/2013 1440   CALCIUM 8.9 12/07/2023 0613   CALCIUM 9.8 05/01/2013 1440   CBC:    Component Value Date/Time   WBC 16.5 (H) 12/07/2023 0613   HGB 6.7 (LL) 12/07/2023 0613   HGB 6.6 (L) 05/01/2013 1440   HCT 20.6 (L) 12/07/2023 0613   HCT 20.1 (L) 05/01/2013 1440   PLT 442 (H) 12/07/2023 0613   PLT 468 (H) 05/01/2013 1440   MCV 105.6 (H) 12/07/2023 0613   MCV 89 05/01/2013 1440   NEUTROABS 7.3 12/01/2023 2047   NEUTROABS 7.9 05/30/2012 1958   LYMPHSABS 3.7 12/01/2023 2047   LYMPHSABS 5.0 05/30/2012 1958   MONOABS 2.0 (H) 12/01/2023 2047  MONOABS 1.6 (H) 05/30/2012 1958   EOSABS 0.4 12/01/2023 2047   EOSABS 0.6 05/30/2012 1958   BASOSABS 0.1 12/01/2023 2047   BASOSABS 3 04/28/2013 1736   BASOSABS 0.2 (H) 05/30/2012 1958    Recent Results (from the past 240 hours)  Resp panel by RT-PCR (RSV, Flu A&B, Covid) Anterior Nasal Swab     Status: None   Collection Time: 12/01/23  8:47 PM   Specimen: Anterior Nasal Swab  Result Value Ref Range Status   SARS Coronavirus 2 by RT PCR NEGATIVE NEGATIVE Final    Comment: (NOTE) SARS-CoV-2 target nucleic acids are NOT DETECTED.  The SARS-CoV-2 RNA is generally detectable in upper respiratory specimens during the acute phase of  infection. The lowest concentration of SARS-CoV-2 viral copies this assay can detect is 138 copies/mL. A negative result does not preclude SARS-Cov-2 infection and should not be used as the sole basis for treatment or other patient management decisions. A negative result may occur with  improper specimen collection/handling, submission of specimen other than nasopharyngeal swab, presence of viral mutation(s) within the areas targeted by this assay, and inadequate number of viral copies(<138 copies/mL). A negative result must be combined with clinical observations, patient history, and epidemiological information. The expected result is Negative.  Fact Sheet for Patients:  BloggerCourse.com  Fact Sheet for Healthcare Providers:  SeriousBroker.it  This test is no t yet approved or cleared by the [place]  FDA and  has been authorized for detection and/or diagnosis of SARS-CoV-2 by FDA under an Emergency Use Authorization (EUA). This EUA will remain  in effect (meaning this test can be used) for the duration of the COVID-19 declaration under Section 564(b)(1) of the Act, 21 U.S.C.section 360bbb-3(b)(1), unless the authorization is terminated  or revoked sooner.       Influenza A by PCR NEGATIVE NEGATIVE Final   Influenza B by PCR NEGATIVE NEGATIVE Final    Comment: (NOTE) The Xpert Xpress SARS-CoV-2/FLU/RSV plus assay is intended as an aid in the diagnosis of influenza from Nasopharyngeal swab specimens and should not be used as a sole basis for treatment. Nasal washings and aspirates are unacceptable for Xpert Xpress SARS-CoV-2/FLU/RSV testing.  Fact Sheet for Patients: BloggerCourse.com  Fact Sheet for Healthcare Providers: SeriousBroker.it  This test is not yet approved or cleared by the [place]  FDA and has been authorized for detection and/or diagnosis of SARS-CoV-2  by FDA under an Emergency Use Authorization (EUA). This EUA will remain in effect (meaning this test can be used) for the duration of the COVID-19 declaration under Section 564(b)(1) of the Act, 21 U.S.C. section 360bbb-3(b)(1), unless the authorization is terminated or revoked.     Resp Syncytial Virus by PCR NEGATIVE NEGATIVE Final    Comment: (NOTE) Fact Sheet for Patients: BloggerCourse.com  Fact Sheet for Healthcare Providers: SeriousBroker.it  This test is not yet approved or cleared by the [place]  FDA and has been authorized for detection and/or diagnosis of SARS-CoV-2 by FDA under an Emergency Use Authorization (EUA). This EUA will remain in effect (meaning this test can be used) for the duration of the COVID-19 declaration under Section 564(b)(1) of the Act, 21 U.S.C. section 360bbb-3(b)(1), unless the authorization is terminated or revoked.  Performed at [facility], [address] [place], [place] [address]   Group A Strep by PCR [facility] Only)     Status: None   Collection Time: 12/01/23  8:47 PM   Specimen: Throat; Sterile Swab  Result Value Ref Range Status  Group A Strep by PCR NOT DETECTED NOT DETECTED Final    Comment: Performed at [facility], [address] [place], [place] [address]    Studies/Results: No results found.    Medications: Scheduled Meds:  Deferasirox   360 mg Oral BID   fluticasone  furoate-vilanterol  1 puff Inhalation Daily   HYDROmorphone    Intravenous Q4H   hydroxyurea   500 mg Oral BID WC   lubiprostone   24 mcg Oral BID WC   pantoprazole   40 mg Oral Daily   pregabalin   150 mg Oral TID   senna-docusate  1 tablet Oral BID   Continuous Infusions:  sodium chloride  10 mL/hr at 12/07/23 1533   PRN Meds:.diphenhydrAMINE , fluticasone , naloxone  **AND** sodium chloride  flush, ondansetron  **OR** ondansetron  (ZOFRAN ) IV, oxyCODONE , polyethylene  glycol  Consultants: None   Procedures: None  Antibiotics: None  Assessment/Plan: Principal Problem:   Sickle cell crisis (HCC) Active Problems:   Chronic pain syndrome   GAD (generalized anxiety disorder)   Anemia of chronic disease   Hyperbilirubinemia   Hb Sickle Cell Disease with Pain crisis: Continue hydration IVF 0.45% Saline at 75 mL/h continue weight based Dilaudid  PCA, continue oral home pain medications as ordered. Monitor vitals very closely, Re-evaluate pain scale regularly, 2 L of Oxygen by [place]. Patient encouraged to ambulate on the hallway today.  Leukocytosis: Significant improvement to WBC in the last 24 hours. No changes to current regimen. Continue gentle hydration, CMP shows slightly elevated total bilirubin, improved ALT, AST and albumin from 4 weeks ago.  Will continue to monitor closely without antibiotics at this time.  Anemia of Chronic Disease: Hemoglobin is lower than patients baseline of 6. 7 g/dl. Will transfuse 1 unit packed red blood cells.  Continue to monitor daily CBC. Chronic pain Syndrome: continue oral home pain medication as ordered.  Mild intermittent asthma without complication: Stable, no acute excerebration. Continue Symbicort as needed  GAD: Stable, continue medication as prescribed  Code Status: Full Code Family Communication: N/A Disposition Plan: ready for discharge in the AM  [name] Cover NP   If 7PM-7AM, please contact night-coverage.  12/07/2023, 4:00 PM  LOS: 5 days

## 2023-12-07 NOTE — Progress Notes (Signed)
 IVT consult placed for additional access for blood transfusion. 1 attempt made in left forearm without success. Successful attempt made in right AC. Patient has extremely limited peripheral options (including bilateral cephalic veins). Highly recommend consideration for PAC placement given her limited options and recurrent pain crises due to sickle cell. Patient verbalized desire for a port but mentioned her primary care provider was not on board.   Lucille Crichlow R Malya Cirillo, RN

## 2023-12-07 NOTE — Plan of Care (Signed)

## 2023-12-08 LAB — TYPE AND SCREEN
ABO/RH(D): B POS
Antibody Screen: NEGATIVE
Unit division: 0

## 2023-12-08 LAB — BPAM RBC
Blood Product Expiration Date: 202508222359
ISSUE DATE / TIME: 202507241419
Unit Type and Rh: 7300

## 2023-12-08 LAB — CBC
HCT: 27.1 % — ABNORMAL LOW (ref 36.0–46.0)
Hemoglobin: 9 g/dL — ABNORMAL LOW (ref 12.0–15.0)
MCH: 32.8 pg (ref 26.0–34.0)
MCHC: 33.2 g/dL (ref 30.0–36.0)
MCV: 98.9 fL (ref 80.0–100.0)
Platelets: 498 K/uL — ABNORMAL HIGH (ref 150–400)
RBC: 2.74 MIL/uL — ABNORMAL LOW (ref 3.87–5.11)
RDW: 23.4 % — ABNORMAL HIGH (ref 11.5–15.5)
WBC: 12 K/uL — ABNORMAL HIGH (ref 4.0–10.5)
nRBC: 6.7 % — ABNORMAL HIGH (ref 0.0–0.2)

## 2023-12-08 NOTE — Plan of Care (Signed)

## 2023-12-08 NOTE — Progress Notes (Signed)
 Clinical Pharmacist Practitioner: [facility]   Bailey Reese is a 18 y.o. female with hgb SS disease who called for oxycodone  fill after discharge  Current pain regimen:  Butrans  patch 20 mcg/hour once weekly Current hydroxyurea  dose: 1500 mg once daily  Plan: -ok for oxycodone  fill today #20 with following hospital discharge today. -Continue buprenorphine  20 mcg patch weekly -follow-up video visit with me on aug 6th  Prescriptions: -oxycodone  10 mg #20, prescription sent electronically to patient's preferred pharmacy [facility]   F/u 8/6 with CPP ___________________________________________________________________  Interim Pain History: Patient reported following: -had not tolerated acetaminophen  and ibuprofen  due to GI side effects -had tried muscle rub cream with no pain relief -had difficulty sleeping last night, took 4 tablets of doxylamine (OTC sleep aid) through the night, last dose this morning at 4:30 am, felt groggy and went to the ED this morning, left ED  -removed butrans  patch before shower, had not replaced  Reported Description of Pain: Location:  back, thigh Character:  aching   [place] database was reviewed today and it was appropriate.  There are no aberrant drug related behaviors observed. Discussed medication adherence and safety with patient, who is in agreement with treatment plan as outlined above.   Interim hydroxyurea  history: Current Hydroxyurea  regimen: 1500 mg once daily  (patient takes in the morning Patient reported missing 1 dose in the last week  Side effects: none MCV on 11/08/23 was 95.4  Reasons for non adherence busy life schedule     Bailey Reese, PharmD, BCOP, CPP Clinical Pharmacist Practitioner, Benign Hematology   Pain Action Plan                                                            Sickle Cell Team Hemoglobin Type  Slater Ly, MD, Belvie Cecil, MD, Jayson Blush, MD, Rexene Fenton, ANP, Maeola Blumenthal, AGPCNP-BC  Hemoglobin SS    Emergency numbers: [phone] (24 hours)   Call 911 or come to the emergency department immediately if: Chest pain and difficulty breathing Stroke symptoms Vision changes    Common triggers for pain crisis and how to prevent: Cold weather: Dress warmly and in layers Swim/water activities: Limit time in the water, change out of wet clothes Dehydration: Drink more liquids Exercise: Warm up, drink plenty of liquids, take breaks Hot weather: Drink plenty of fluids, limit time outside Infection: Wash hands, avoid sick contacts  Stress: Work with family/friends/medical team to improve coping skill and learn techniques to lower stress levels Emotions: Consider ways to deal with emotions: journaling, art therapy, talking with friends/family Trauma: Be careful with chosen activities       GREEN ZONE - GO  Symptoms  No pain or pain is at normal level (at baseline)   Advice  Continue all regularly scheduled medications Continue ways to prevent triggers for pain crisis (refer above) Check your medication supply on a regular basis and call your hematology team if you are running out of  medications.  Regularly Scheduled Medications Folic acid : Helps make new red blood cells Hydroxyurea : Helps prevent pain and other problems related to sickle cell disease Medications for chronic or nerve pain  Pregabalin  (Lyrica ) Butrans  patch        YELLOW ZONE - CAUTION  Symptoms: Increased pain, more than baseline pain Onset of new  pain episode   Advice: Start your "as needed" medications  (both can be taken at the same time) Continue your regular "Green Zone" medications  Increase hydration, rest, stay warm, use warm/hot packs Can also try over the counter topical ointments Discuss with your hematology team some distraction techniques that you can try at home to help manage your pain  Call your hematology team if you are not improving or you are running out of  medications!  Anti-inflammatory medication Decreases pain and swelling  none  Topical anti-inflammatory or analgesic medication Placed on the skin to provide pain relief  Capsacin  Lidocaine  (gel or patch) Analgesic medication Decreases pain  Oxycodone  (Roxicodone ) Capsaicin Cream and patch Lidocaine  cream          RED ZONE - DANGER  Symptoms: Pain not controlled at home with "as needed" medications after 24 hours OR pain worsening or becomes severe Fever Difficulty breathing or pain in the chest Extreme fatigue or pallor Increased spleen size Signs of stroke - difficulty walking/talking Signs of a blood clot in the leg - pain, swelling, changes in color   Advice: CALL your hematology team immediately and/or GO to your nearest hospital.

## 2023-12-08 NOTE — Plan of Care (Addendum)
 Patient discharged home, all medications and future appointments printed in AVS. PCA syringe remainder wasted, PIV's removed, Pt called grandma for transportation home. No further needs atm.  Problem: Education: Goal: Knowledge of General Education information will improve Description: Including pain rating scale, medication(s)/side effects and non-pharmacologic comfort measures Outcome: Completed/Met   Problem: Health Behavior/Discharge Planning: Goal: Ability to manage health-related needs will improve Outcome: Completed/Met   Problem: Clinical Measurements: Goal: Ability to maintain clinical measurements within normal limits will improve Outcome: Completed/Met Goal: Will remain free from infection Outcome: Completed/Met Goal: Diagnostic test results will improve Outcome: Completed/Met Goal: Respiratory complications will improve Outcome: Completed/Met Goal: Cardiovascular complication will be avoided Outcome: Completed/Met   Problem: Activity: Goal: Risk for activity intolerance will decrease Outcome: Completed/Met   Problem: Nutrition: Goal: Adequate nutrition will be maintained Outcome: Completed/Met   Problem: Coping: Goal: Level of anxiety will decrease Outcome: Completed/Met   Problem: Elimination: Goal: Will not experience complications related to bowel motility Outcome: Completed/Met Goal: Will not experience complications related to urinary retention Outcome: Completed/Met   Problem: Pain Managment: Goal: General experience of comfort will improve and/or be controlled Outcome: Completed/Met   Problem: Safety: Goal: Ability to remain free from injury will improve Outcome: Completed/Met   Problem: Skin Integrity: Goal: Risk for impaired skin integrity will decrease Outcome: Completed/Met   Problem: Education: Goal: Knowledge of vaso-occlusive preventative measures will improve Outcome: Completed/Met Goal: Awareness of infection prevention will  improve Outcome: Completed/Met Goal: Awareness of signs and symptoms of anemia will improve Outcome: Completed/Met Goal: Long-term complications will improve Outcome: Completed/Met   Problem: Self-Care: Goal: Ability to incorporate actions that prevent/reduce pain crisis will improve Outcome: Completed/Met   Problem: Bowel/Gastric: Goal: Gut motility will be maintained Outcome: Completed/Met   Problem: Tissue Perfusion: Goal: Complications related to inadequate tissue perfusion will be avoided or minimized Outcome: Completed/Met   Problem: Respiratory: Goal: Pulmonary complications will be avoided or minimized Outcome: Completed/Met Goal: Acute Chest Syndrome will be identified early to prevent complications Outcome: Completed/Met   Problem: Fluid Volume: Goal: Ability to maintain a balanced intake and output will improve Outcome: Completed/Met   Problem: Sensory: Goal: Pain level will decrease with appropriate interventions Outcome: Completed/Met   Problem: Health Behavior: Goal: Postive changes in compliance with treatment and prescription regimens will improve Outcome: Completed/Met

## 2023-12-08 NOTE — Discharge Summary (Signed)
 Physician Discharge Summary  Bailey Reese FMW:[id number] DOB: April 15, 2006 DOA: 12/02/2023  PCP: Morgan Slater Pizza, MD  Admit date: 12/02/2023  Discharge date: 12/08/2023  Discharge Diagnoses:  Principal Problem:   Sickle cell crisis (HCC) Active Problems:   Chronic pain syndrome   GAD (generalized anxiety disorder)   Anemia of chronic disease   Hyperbilirubinemia   Discharge Condition: Stable  Disposition:  Pt is discharged home in good condition and is to follow up with Little, Slater Pizza, MD this week to have labs evaluated. Bailey Reese is instructed to increase activity slowly and balance with rest for the next few days, and use prescribed medication to complete treatment of pain  Diet: Regular Wt Readings from Last 3 Encounters:  12/04/23 65.8 kg (75%, Z= 0.69)*  12/01/23 65.8 kg (75%, Z= 0.69)*  11/20/23 65.8 kg (75%, Z= 0.69)*   * Growth percentiles are based on CDC (Girls, 2-20 Years) data.    History of present illness:  Bailey Reese  is a 18 y.o. female with history of sickle cell disease, PTSD, depression with anxiety who usually gets her care at [facility] but was seen at [facility] for abdominal pain , Nausea and emesis not typical of her sickle cell pain.  Patient was managed in the ER for crisis however her pain was not controlled. Patient was transferred to our care for continued sickle cell crisis pain control.  She has vitals that are stable.  Hemoglobin is around 8.2 g/dl.  She was getting IV Dilaudid  pushes in the ER.  Patient denied diarrhea.  Denied any viral illness, cough, SOB. No urinary symptoms. She has had previous osteomyelitis and multiple episodes of acute chest syndromes.  Also hemorrhagic stroke in 2017.  Patient has chronic pain from her sickle cell disease.  No recent travels or sick contacts.    ED Course:  Patient was treated in the ED with IVF, IV pain medication with no resolution to symptoms. Chest X-ray is  negative. Patient admitted for ongoing sickle cell pain management.  BP: 124/71  Pulse: 101  Resp: 16  Temp: 99.4 F (37.4C)  SpO2: 100%     WBC 13.3 (*)        RBC 2.41 (*)      Hemoglobin 8.2 (*)      HCT 23.8 (*)      RDW 18.8 (*)      Platelets 422 (*)      nRBC 1.7 (*)      Lymphs Abs 4.5 (*)      Monocytes Absolute 2.0 (*)      Basophils Absolute 0.2 (*)      All other components within normal limits  BASIC METABOLIC PANEL WITH GFR - Abnormal; Notable for the following components:    Glucose, Bld 101 (*)      All other components within normal limits  RETICULOCYTES - Abnormal; Notable for the following components:    Retic Ct Pct 14.9 (*)      RBC. 2.43 (*)      Retic Count, Absolute 360.9 (*)      Immature Retic Fract 39.6 (*)      All other components within normal limits  URINALYSIS, W/ REFLEX TO CULTURE (INFECTION SUSPECTED) - Abnormal; Notable for the following components:    Color, Urine YELLOW (*)      APPearance HAZY (*)      All other components within normal limits  POC URINE PREG, ED  Hospital Course:  Patient was admitted for sickle cell pain crisis and managed appropriately with IVF, IV Dilaudid  via PCA and IV Toradol , as well as other adjunct therapies per sickle cell pain management protocols.  While on admission patient's hemoglobin dropped below baseline at 6.7 g/dL.  Patient was transfused with 1 unit packed red blood cells.  Hemoglobin increased to 9.0 g/dL.  Patient tolerated blood transfusion without transfusion reactions.  Patient is reporting improved pain today, walking without assistance tolerating PO without obstruction, nausea or vomiting. Patient will continue to manage chronic pain at home.  She has no new concerns Patient was therefore discharged home today in a hemodynamically stable condition.   [name] was counseled extensively about nonpharmacologic means of pain management, patient verbalized understanding and was  appreciative of  the care received during this admission.   We discussed the need for good hydration, monitoring of hydration status, avoidance of heat, cold, stress, and infection triggers. We discussed the need to be adherent with taking other home medications. Patient was reminded of the need to seek medical attention immediately if any symptom of bleeding, anemia, or infection occurs.  Discharge Exam: Vitals:   12/08/23 0948 12/08/23 1052  BP: 116/65   Pulse: (!) 103   Resp: 16 16  Temp: 98.1 F (36.7 C)   SpO2: 97%    Vitals:   12/08/23 0507 12/08/23 0730 12/08/23 0948 12/08/23 1052  BP: 124/71  116/65   Pulse: 99  (!) 103   Resp: 18 18 16 16   Temp: 98.4 F (36.9 C)  98.1 F (36.7 C)   TempSrc: Oral  Oral   SpO2: 99%  97%   Weight:      Height:        General appearance : Awake, alert, not in any distress. Speech Clear. Not toxic looking HEENT: Atraumatic and Normocephalic, pupils equally reactive to light and accomodation Neck: Supple, no JVD. No cervical lymphadenopathy.  Chest: Good air entry bilaterally, no added sounds  CVS: S1 S2 regular, no murmurs.  Abdomen: Bowel sounds present, Non tender and not distended with no gaurding, rigidity or rebound. Extremities: B/L Lower Ext shows no edema, both legs are warm to touch Neurology: Awake alert, and oriented X 3, CN II-XII intact, Non focal Skin: No Rash  Discharge Instructions  Discharge Instructions     Call MD for:  severe uncontrolled pain   Complete by: As directed    Call MD for:  temperature >100.4   Complete by: As directed    Diet - low sodium heart healthy   Complete by: As directed    Increase activity slowly   Complete by: As directed           The results of significant diagnostics from this hospitalization (including imaging, microbiology, ancillary and laboratory) are listed below for reference.    Significant Diagnostic Studies: DG Chest 2 View Result Date: 12/01/2023 CLINICAL DATA:   Chest pain shortness of breath and cough EXAM: CHEST - 2 VIEW COMPARISON:  11/23/2023 FINDINGS: Upper normal cardiac size. No acute airspace disease, pleural effusion or pneumothorax. IMPRESSION: No active cardiopulmonary disease.  Upper normal cardiac size. Electronically Signed   By: Luke Bun M.D.   On: 12/01/2023 21:08   DG Chest Port 1 View Result Date: 11/23/2023 CLINICAL DATA:  [id number].  Chest pain. EXAM: PORTABLE CHEST 1 VIEW COMPARISON:  Portable chest 11/20/2023 FINDINGS: 4:03 a.m. Interval increased cardiac diameter and increased central vascular distension. There is a low inspiration on  exam. There is either mild basilar interstitial edema or bronchovascular crowding from low inspiration. The hypoexpanded lungs otherwise appear clear.  The sulci are sharp. The mediastinum is normally outlined. Slight upper thoracic levoscoliosis. A follow-up study in full inspiration is recommended preferably PA and lateral views. IMPRESSION: 1. Interval increased cardiac diameter and increased central vascular distension, but could be exaggerated by low inspiration. 2. Low inspiration on exam with either mild basilar interstitial edema or bronchovascular crowding from low inspiration. 3. A follow-up study in full inspiration is recommended preferably PA and lateral views. Electronically Signed   By: Francis Quam M.D.   On: 11/23/2023 04:42   DG Chest Portable 1 View Result Date: 11/20/2023 CLINICAL DATA:  Chest pain. Back pain and extremity pain for 2 days. Possible sickle cell crisis. EXAM: PORTABLE CHEST 1 VIEW COMPARISON:  10/29/2023 FINDINGS: Shallow inspiration. Heart size and pulmonary vascularity are normal. Lungs are clear. No pleural effusion or pneumothorax. Mediastinal contours appear intact. Visualized bones appear intact. IMPRESSION: No active disease. Electronically Signed   By: Elsie Gravely M.D.   On: 11/20/2023 04:12    Microbiology: Recent Results (from the past 240 hours)  Resp panel  by RT-PCR (RSV, Flu A&B, Covid) Anterior Nasal Swab     Status: None   Collection Time: 12/01/23  8:47 PM   Specimen: Anterior Nasal Swab  Result Value Ref Range Status   SARS Coronavirus 2 by RT PCR NEGATIVE NEGATIVE Final    Comment: (NOTE) SARS-CoV-2 target nucleic acids are NOT DETECTED.  The SARS-CoV-2 RNA is generally detectable in upper respiratory specimens during the acute phase of infection. The lowest concentration of SARS-CoV-2 viral copies this assay can detect is 138 copies/mL. A negative result does not preclude SARS-Cov-2 infection and should not be used as the sole basis for treatment or other patient management decisions. A negative result may occur with  improper specimen collection/handling, submission of specimen other than nasopharyngeal swab, presence of viral mutation(s) within the areas targeted by this assay, and inadequate number of viral copies(<138 copies/mL). A negative result must be combined with clinical observations, patient history, and epidemiological information. The expected result is Negative.  Fact Sheet for Patients:  BloggerCourse.com  Fact Sheet for Healthcare Providers:  SeriousBroker.it  This test is no t yet approved or cleared by the [place]  FDA and  has been authorized for detection and/or diagnosis of SARS-CoV-2 by FDA under an Emergency Use Authorization (EUA). This EUA will remain  in effect (meaning this test can be used) for the duration of the COVID-19 declaration under Section 564(b)(1) of the Act, 21 U.S.C.section 360bbb-3(b)(1), unless the authorization is terminated  or revoked sooner.       Influenza A by PCR NEGATIVE NEGATIVE Final   Influenza B by PCR NEGATIVE NEGATIVE Final    Comment: (NOTE) The Xpert Xpress SARS-CoV-2/FLU/RSV plus assay is intended as an aid in the diagnosis of influenza from Nasopharyngeal swab specimens and should not be used as a sole  basis for treatment. Nasal washings and aspirates are unacceptable for Xpert Xpress SARS-CoV-2/FLU/RSV testing.  Fact Sheet for Patients: BloggerCourse.com  Fact Sheet for Healthcare Providers: SeriousBroker.it  This test is not yet approved or cleared by the [place]  FDA and has been authorized for detection and/or diagnosis of SARS-CoV-2 by FDA under an Emergency Use Authorization (EUA). This EUA will remain in effect (meaning this test can be used) for the duration of the COVID-19 declaration under Section 564(b)(1) of the Act, 21 U.S.C.  section 360bbb-3(b)(1), unless the authorization is terminated or revoked.     Resp Syncytial Virus by PCR NEGATIVE NEGATIVE Final    Comment: (NOTE) Fact Sheet for Patients: BloggerCourse.com  Fact Sheet for Healthcare Providers: SeriousBroker.it  This test is not yet approved or cleared by the [place]  FDA and has been authorized for detection and/or diagnosis of SARS-CoV-2 by FDA under an Emergency Use Authorization (EUA). This EUA will remain in effect (meaning this test can be used) for the duration of the COVID-19 declaration under Section 564(b)(1) of the Act, 21 U.S.C. section 360bbb-3(b)(1), unless the authorization is terminated or revoked.  Performed at [facility], [address] [place], [place] [address]   Group A Strep by PCR [facility] Only)     Status: None   Collection Time: 12/01/23  8:47 PM   Specimen: Throat; Sterile Swab  Result Value Ref Range Status   Group A Strep by PCR NOT DETECTED NOT DETECTED Final    Comment: Performed at [facility], [address] [place], [place] [address]     Labs: Basic Metabolic Panel: Recent Labs  Lab 12/01/23 2047 12/04/23 0555 12/07/23 0613  NA 138 136 136  K 4.0 3.8 4.1  CL 108 106 106  CO2 22 22 24   GLUCOSE 93 98 104*  BUN 7 9 8   CREATININE  0.48 0.48 0.55  CALCIUM 9.2 9.1 8.9   Liver Function Tests: Recent Labs  Lab 12/01/23 2047 12/07/23 0613  AST 31 42*  ALT 39 58*  ALKPHOS 118 120  BILITOT 4.8* 5.1*  PROT 7.9 7.1  ALBUMIN 4.0 3.3*    No results for input(s): LIPASE, AMYLASE in the last 168 hours. No results for input(s): AMMONIA in the last 168 hours. CBC: Recent Labs  Lab 12/01/23 2047 12/03/23 0619 12/04/23 0555 12/06/23 0621 12/07/23 0613 12/08/23 0521  WBC 13.5* 11.7* 13.6* 22.0* 16.5* 12.0*  NEUTROABS 7.3  --   --   --   --   --   HGB 8.4* 7.5* 7.0* 6.9* 6.7* 9.0*  HCT 23.6* 22.2* 20.3* 20.3* 20.6* 27.1*  MCV 97.1 101.8* 99.0 102.0* 105.6* 98.9  PLT 500* 474* 379 422* 442* 498*   Cardiac Enzymes: No results for input(s): CKTOTAL, CKMB, CKMBINDEX, TROPONINI in the last 168 hours. BNP: Invalid input(s): POCBNP CBG: No results for input(s): GLUCAP in the last 168 hours.  Time coordinating discharge: 50 minutes  Signed:  [name] Cover NP   [facility] 12/08/2023, 1:07 PM

## 2023-12-10 ENCOUNTER — Other Ambulatory Visit: Payer: Self-pay

## 2023-12-10 ENCOUNTER — Emergency Department
Admission: EM | Admit: 2023-12-10 | Discharge: 2023-12-11 | Disposition: A | Discharge status: Other Acute Inpatient Hospital | Attending: Emergency Medicine | Admitting: Emergency Medicine

## 2023-12-10 ENCOUNTER — Emergency Department

## 2023-12-10 DIAGNOSIS — D72829 Elevated white blood cell count, unspecified: Secondary | ICD-10-CM | POA: Diagnosis not present

## 2023-12-10 DIAGNOSIS — D649 Anemia, unspecified: Secondary | ICD-10-CM | POA: Diagnosis not present

## 2023-12-10 DIAGNOSIS — R079 Chest pain, unspecified: Secondary | ICD-10-CM | POA: Insufficient documentation

## 2023-12-10 DIAGNOSIS — R55 Syncope and collapse: Secondary | ICD-10-CM | POA: Insufficient documentation

## 2023-12-10 DIAGNOSIS — D5700 Hb-SS disease with crisis, unspecified: Secondary | ICD-10-CM

## 2023-12-10 LAB — CBC WITH DIFFERENTIAL/PLATELET
Abs Immature Granulocytes: 0.06 K/uL (ref 0.00–0.07)
Basophils Absolute: 0.1 K/uL (ref 0.0–0.1)
Basophils Relative: 1 %
Eosinophils Absolute: 0.1 K/uL (ref 0.0–0.5)
Eosinophils Relative: 1 %
HCT: 25.5 % — ABNORMAL LOW (ref 36.0–46.0)
Hemoglobin: 8.8 g/dL — ABNORMAL LOW (ref 12.0–15.0)
Immature Granulocytes: 0 %
Lymphocytes Relative: 18 %
Lymphs Abs: 2.5 K/uL (ref 0.7–4.0)
MCH: 33.1 pg (ref 26.0–34.0)
MCHC: 34.5 g/dL (ref 30.0–36.0)
MCV: 95.9 fL (ref 80.0–100.0)
Monocytes Absolute: 2.3 K/uL — ABNORMAL HIGH (ref 0.1–1.0)
Monocytes Relative: 16 %
Neutro Abs: 9.1 K/uL — ABNORMAL HIGH (ref 1.7–7.7)
Neutrophils Relative %: 64 %
Platelets: 423 K/uL — ABNORMAL HIGH (ref 150–400)
RBC: 2.66 MIL/uL — ABNORMAL LOW (ref 3.87–5.11)
RDW: 20.3 % — ABNORMAL HIGH (ref 11.5–15.5)
WBC: 14.2 K/uL — ABNORMAL HIGH (ref 4.0–10.5)
nRBC: 1.1 % — ABNORMAL HIGH (ref 0.0–0.2)

## 2023-12-10 LAB — COMPREHENSIVE METABOLIC PANEL WITH GFR
ALT: 95 U/L — ABNORMAL HIGH (ref 0–44)
AST: 51 U/L — ABNORMAL HIGH (ref 15–41)
Albumin: 4.4 g/dL (ref 3.5–5.0)
Alkaline Phosphatase: 125 U/L (ref 38–126)
Anion gap: 11 (ref 5–15)
BUN: 12 mg/dL (ref 6–20)
CO2: 20 mmol/L — ABNORMAL LOW (ref 22–32)
Calcium: 9.5 mg/dL (ref 8.9–10.3)
Chloride: 105 mmol/L (ref 98–111)
Creatinine, Ser: 0.75 mg/dL (ref 0.44–1.00)
GFR, Estimated: >60 mL/min (ref >60)
Glucose, Bld: 94 mg/dL (ref 70–99)
Potassium: 3.6 mmol/L (ref 3.5–5.1)
Sodium: 136 mmol/L (ref 135–145)
Total Bilirubin: 6.2 mg/dL — ABNORMAL HIGH (ref 0.0–1.2)
Total Protein: 8.8 g/dL — ABNORMAL HIGH (ref 6.5–8.1)

## 2023-12-10 LAB — RETICULOCYTES
Immature Retic Fract: 22.3 % — ABNORMAL HIGH (ref 2.3–15.9)
RBC.: 2.62 MIL/uL — ABNORMAL LOW (ref 3.87–5.11)
Retic Count, Absolute: 417.5 K/uL — ABNORMAL HIGH (ref 19.0–186.0)
Retic Ct Pct: 13.5 % — ABNORMAL HIGH (ref 0.4–3.1)

## 2023-12-10 LAB — D-DIMER, QUANTITATIVE: D-Dimer, Quant: 2.49 ug{FEU}/mL — ABNORMAL HIGH (ref 0.00–0.50)

## 2023-12-10 MED ORDER — DIPHENHYDRAMINE HCL 25 MG PO CAPS
25.0000 mg | ORAL_CAPSULE | Freq: Once | ORAL | Status: AC
  Administered 2023-12-10: 25 mg via ORAL
  Filled 2023-12-10: qty 1

## 2023-12-10 MED ORDER — SODIUM CHLORIDE 0.9 % IV BOLUS
1000.0000 mL | Freq: Once | INTRAVENOUS | Status: AC
  Administered 2023-12-11: 1000 mL via INTRAVENOUS

## 2023-12-10 MED ORDER — IPRATROPIUM-ALBUTEROL 0.5-2.5 (3) MG/3ML IN SOLN
3.0000 mL | Freq: Once | RESPIRATORY_TRACT | Status: AC
  Administered 2023-12-11: 3 mL via RESPIRATORY_TRACT
  Filled 2023-12-10: qty 3

## 2023-12-10 MED ORDER — KETOROLAC TROMETHAMINE 15 MG/ML IJ SOLN
15.0000 mg | Freq: Once | INTRAMUSCULAR | Status: AC
  Administered 2023-12-11: 15 mg via INTRAVENOUS
  Filled 2023-12-10: qty 1

## 2023-12-10 MED ORDER — HYDROMORPHONE HCL 1 MG/ML IJ SOLN
1.0000 mg | INTRAMUSCULAR | Status: AC
  Administered 2023-12-10 (×2): 1 mg via INTRAVENOUS
  Filled 2023-12-10 (×3): qty 1

## 2023-12-10 NOTE — ED Provider Notes (Signed)
 [facility] Provider Note    Event Date/Time   First MD Initiated Contact with Patient 12/10/23 2308     (approximate)   History   Sickle Cell Pain Crisis and Loss of Consciousness   HPI  Bailey Reese is a 18 year old female with history of sickle cell anemia presenting to the emergency department for evaluation of syncope and chest pain.  Patient recently discharged after admission related to pain crisis.  Today, was at church when she felt weak, went outside.  Tried using her inhaler without improvement.  There she felt lightheaded and had a witnessed syncopal episode.  No head trauma.  Does report chest pain, back pain, joint pain consistent with prior sickle cell crises.  Physical Exam   Triage Vital Signs: ED Triage Vitals  Encounter Vitals Group     BP 12/10/23 2138 110/70     Girls Systolic BP Percentile --      Girls Diastolic BP Percentile --      Boys Systolic BP Percentile --      Boys Diastolic BP Percentile --      Pulse Rate 12/10/23 2138 94     Resp 12/10/23 2138 16     Temp 12/10/23 2138 98.8 F (37.1 C)     Temp Source 12/10/23 2138 Oral     SpO2 12/10/23 2138 100 %     Weight --      Height --      Head Circumference --      Peak Flow --      Pain Score 12/10/23 2134 10     Pain Loc --      Pain Education --      Exclude from Growth Chart --     Most recent vital signs: Vitals:   12/10/23 2200 12/10/23 2230  BP: 119/65 119/71  Pulse: 84 93  Resp: 15 16  Temp:    SpO2: 100% 100%     General: Awake, interactive  CV:  Regular rate, good peripheral perfusion.  Resp:  Unlabored respirations, mild expiratory wheezing noted Abd:  Nondistended.  Neuro:  Symmetric facial movement, fluid speech   ED Results / Procedures / Treatments   Labs (all labs ordered are listed, but only abnormal results are displayed) Labs Reviewed  COMPREHENSIVE METABOLIC PANEL WITH GFR - Abnormal; Notable for the following components:       Result Value   CO2 20 (*)    Total Protein 8.8 (*)    AST 51 (*)    ALT 95 (*)    Total Bilirubin 6.2 (*)    All other components within normal limits  CBC WITH DIFFERENTIAL/PLATELET - Abnormal; Notable for the following components:   WBC 14.2 (*)    RBC 2.66 (*)    Hemoglobin 8.8 (*)    HCT 25.5 (*)    RDW 20.3 (*)    Platelets 423 (*)    nRBC 1.1 (*)    Neutro Abs 9.1 (*)    Monocytes Absolute 2.3 (*)    All other components within normal limits  RETICULOCYTES - Abnormal; Notable for the following components:   Retic Ct Pct 13.5 (*)    RBC. 2.62 (*)    Retic Count, Absolute 417.5 (*)    Immature Retic Fract 22.3 (*)    All other components within normal limits  D-DIMER, QUANTITATIVE - Abnormal; Notable for the following components:   D-Dimer, Quant 2.49 (*)    All other components within  normal limits  HCG, QUANTITATIVE, PREGNANCY     EKG EKG independently reviewed and interpreted by myself demonstrates:  EKG with sinus rhythm heart rate of 89, PR 131, QRS 96 QTc 440, no acute ST changes  RADIOLOGY Imaging independently reviewed and interpreted by myself demonstrates:  CXR without focal consolidation  Formal Radiology Read:  [facility] Chest Port 1 View Result Date: 12/10/2023 CLINICAL DATA:  Chest pain.  Sickle cell crisis. EXAM: PORTABLE CHEST 1 VIEW COMPARISON:  12/01/2023 FINDINGS: The cardiac silhouette, mediastinal and hilar contours are within normal limits given the AP projection and portable technique. The lungs are clear. No pleural effusions. The bony thorax is intact. IMPRESSION: No acute cardiopulmonary findings. Electronically Signed   By: MYRTIS Stammer M.D.   On: 12/10/2023 21:59    PROCEDURES:  Critical Care performed: No  Procedures   MEDICATIONS ORDERED IN ED: Medications  HYDROmorphone  (DILAUDID ) injection 1 mg (0 mg Intravenous Hold 12/10/23 2304)  ketorolac  (TORADOL ) 15 MG/ML injection 15 mg (has no administration in time range)  sodium  chloride 0.9 % bolus 1,000 mL (has no administration in time range)  ipratropium-albuterol  (DUONEB) 0.5-2.5 (3) MG/3ML nebulizer solution 3 mL (has no administration in time range)  diphenhydrAMINE  (BENADRYL ) capsule 25 mg (25 mg Oral Given 12/10/23 2209)     IMPRESSION / MDM / ASSESSMENT AND PLAN / ED COURSE  I reviewed the triage vital signs and the nursing notes.  Differential diagnosis includes, but is not limited to, sickle cell pain crisis, pneumonia, acute chest syndrome, PE  Patient's presentation is most consistent with acute presentation with potential threat to life or bodily function.  18 year old female presenting to the emergency department following a syncopal episode with chest pain and body pain consistent with prior pain crises.  I do see a pain plan noted in her prior ER visit here recommending IV Dilaudid  1 mg x 3 which has been ordered.  Also notes that Toradol  can be given if renal function is appropriate.  Patient reported itching with Dilaudid , ordered for oral Benadryl .  Labs with leukocytosis though not new for patient.  Stable anemia. CMP with mild treatment situs and elevated T. bili, slightly worsened from prior.  D-dimer did return significantly elevated at 2.49.  CTA of the chest ordered.  Plan is to oncoming physician at 2300 pending CT of the chest, reassessment of pain, disposition.  If patient has uncontrolled pain after additional IV narcotics, suspect she will require admission.    FINAL CLINICAL IMPRESSION(S) / ED DIAGNOSES   Final diagnoses:  Syncope and collapse  Acute chest pain     Rx / DC Orders   ED Discharge Orders     None        Note:  This document was prepared using Dragon voice recognition software and may include unintentional dictation errors.   Levander Slate, MD 12/10/23 [phone]

## 2023-12-10 NOTE — ED Notes (Signed)
 IV team at bedside

## 2023-12-10 NOTE — ED Triage Notes (Signed)
 Pt presents via EMS c/o syncopal episode while at church P[employer]. Pt has hx of Sickle cell and recentlyadmitted last week with [facility]. C/o pain in chest, back, and joints.   Pt A&O x4 at this time.

## 2023-12-11 ENCOUNTER — Emergency Department

## 2023-12-11 ENCOUNTER — Encounter (HOSPITAL_COMMUNITY): Payer: Self-pay

## 2023-12-11 ENCOUNTER — Other Ambulatory Visit (HOSPITAL_COMMUNITY): Payer: Self-pay

## 2023-12-11 ENCOUNTER — Inpatient Hospital Stay (HOSPITAL_COMMUNITY)
Admission: EM | Admit: 2023-12-11 | Discharge: 2023-12-13 | DRG: 812 | Disposition: A | Discharge status: Home / Self Care | Source: Other Acute Inpatient Hospital | Attending: Internal Medicine | Admitting: Internal Medicine

## 2023-12-11 ENCOUNTER — Encounter (HOSPITAL_COMMUNITY): Payer: Self-pay | Admitting: Family Medicine

## 2023-12-11 DIAGNOSIS — Z91018 Allergy to other foods: Secondary | ICD-10-CM | POA: Diagnosis not present

## 2023-12-11 DIAGNOSIS — Z8673 Personal history of transient ischemic attack (TIA), and cerebral infarction without residual deficits: Secondary | ICD-10-CM

## 2023-12-11 DIAGNOSIS — G894 Chronic pain syndrome: Secondary | ICD-10-CM | POA: Diagnosis present

## 2023-12-11 DIAGNOSIS — R079 Chest pain, unspecified: Secondary | ICD-10-CM | POA: Diagnosis not present

## 2023-12-11 DIAGNOSIS — D5700 Hb-SS disease with crisis, unspecified: Secondary | ICD-10-CM | POA: Diagnosis present

## 2023-12-11 DIAGNOSIS — D72829 Elevated white blood cell count, unspecified: Secondary | ICD-10-CM | POA: Diagnosis present

## 2023-12-11 DIAGNOSIS — Z885 Allergy status to narcotic agent status: Secondary | ICD-10-CM

## 2023-12-11 DIAGNOSIS — Z79899 Other long term (current) drug therapy: Secondary | ICD-10-CM | POA: Diagnosis not present

## 2023-12-11 DIAGNOSIS — F411 Generalized anxiety disorder: Secondary | ICD-10-CM | POA: Diagnosis present

## 2023-12-11 DIAGNOSIS — Z886 Allergy status to analgesic agent status: Secondary | ICD-10-CM

## 2023-12-11 DIAGNOSIS — D638 Anemia in other chronic diseases classified elsewhere: Secondary | ICD-10-CM | POA: Diagnosis present

## 2023-12-11 DIAGNOSIS — Z7951 Long term (current) use of inhaled steroids: Secondary | ICD-10-CM

## 2023-12-11 DIAGNOSIS — Z888 Allergy status to other drugs, medicaments and biological substances status: Secondary | ICD-10-CM

## 2023-12-11 DIAGNOSIS — Z833 Family history of diabetes mellitus: Secondary | ICD-10-CM

## 2023-12-11 DIAGNOSIS — F4310 Post-traumatic stress disorder, unspecified: Secondary | ICD-10-CM | POA: Diagnosis present

## 2023-12-11 DIAGNOSIS — D649 Anemia, unspecified: Secondary | ICD-10-CM | POA: Diagnosis not present

## 2023-12-11 DIAGNOSIS — Z91010 Allergy to peanuts: Secondary | ICD-10-CM

## 2023-12-11 DIAGNOSIS — J4520 Mild intermittent asthma, uncomplicated: Secondary | ICD-10-CM | POA: Diagnosis present

## 2023-12-11 DIAGNOSIS — R55 Syncope and collapse: Secondary | ICD-10-CM | POA: Diagnosis present

## 2023-12-11 LAB — CBC
HCT: 25.8 % — ABNORMAL LOW (ref 36.0–46.0)
Hemoglobin: 8.6 g/dL — ABNORMAL LOW (ref 12.0–15.0)
MCH: 32.8 pg (ref 26.0–34.0)
MCHC: 33.3 g/dL (ref 30.0–36.0)
MCV: 98.5 fL (ref 80.0–100.0)
Platelets: 468 K/uL — ABNORMAL HIGH (ref 150–400)
RBC: 2.62 MIL/uL — ABNORMAL LOW (ref 3.87–5.11)
RDW: 20.2 % — ABNORMAL HIGH (ref 11.5–15.5)
WBC: 8 K/uL (ref 4.0–10.5)
nRBC: 1.1 % — ABNORMAL HIGH (ref 0.0–0.2)

## 2023-12-11 LAB — CREATININE, SERUM
Creatinine, Ser: 0.49 mg/dL (ref 0.44–1.00)
GFR, Estimated: >60 mL/min (ref >60)

## 2023-12-11 LAB — HCG, QUANTITATIVE, PREGNANCY: hCG, Beta Chain, Quant, S: <1 m[IU]/mL

## 2023-12-11 MED ORDER — HYDROMORPHONE HCL 1 MG/ML IJ SOLN
1.0000 mg | Freq: Once | INTRAMUSCULAR | Status: AC
  Administered 2023-12-11: 1 mg via INTRAVENOUS
  Filled 2023-12-11: qty 1

## 2023-12-11 MED ORDER — DIPHENHYDRAMINE HCL 25 MG PO CAPS
25.0000 mg | ORAL_CAPSULE | ORAL | Status: DC | PRN
  Administered 2023-12-12 – 2023-12-13 (×2): 25 mg via ORAL
  Filled 2023-12-11 (×2): qty 1

## 2023-12-11 MED ORDER — HYDROMORPHONE 1 MG/ML IV SOLN
INTRAVENOUS | Status: DC
  Filled 2023-12-11: qty 30

## 2023-12-11 MED ORDER — ENOXAPARIN SODIUM 40 MG/0.4ML IJ SOSY
40.0000 mg | PREFILLED_SYRINGE | Freq: Every day | INTRAMUSCULAR | Status: DC
  Administered 2023-12-11 – 2023-12-13 (×3): 40 mg via SUBCUTANEOUS
  Filled 2023-12-11 (×3): qty 0.4

## 2023-12-11 MED ORDER — NALOXONE HCL 0.4 MG/ML IJ SOLN: 0.4000 mg | INTRAMUSCULAR | Status: DC | PRN

## 2023-12-11 MED ORDER — SODIUM CHLORIDE 0.9 % IV BOLUS
1000.0000 mL | Freq: Once | INTRAVENOUS | Status: AC
  Administered 2023-12-11: 1000 mL via INTRAVENOUS

## 2023-12-11 MED ORDER — IOHEXOL 350 MG/ML SOLN
75.0000 mL | Freq: Once | INTRAVENOUS | Status: AC | PRN
  Administered 2023-12-11: 75 mL via INTRAVENOUS

## 2023-12-11 MED ORDER — HYDROMORPHONE HCL 1 MG/ML IJ SOLN: 1.0000 mg | INTRAMUSCULAR | Status: DC

## 2023-12-11 MED ORDER — SENNOSIDES-DOCUSATE SODIUM 8.6-50 MG PO TABS
1.0000 | ORAL_TABLET | Freq: Two times a day (BID) | ORAL | Status: DC
  Administered 2023-12-11 – 2023-12-13 (×5): 1 via ORAL
  Filled 2023-12-11 (×5): qty 1

## 2023-12-11 MED ORDER — POLYETHYLENE GLYCOL 3350 17 G PO PACK: 17.0000 g | PACK | Freq: Every day | ORAL | Status: DC | PRN

## 2023-12-11 MED ORDER — SODIUM CHLORIDE 0.9% FLUSH: 9.0000 mL | INTRAVENOUS | Status: DC | PRN

## 2023-12-11 MED ORDER — SODIUM CHLORIDE 0.45 % IV SOLN: INTRAVENOUS | Status: DC

## 2023-12-11 MED ORDER — HYDROMORPHONE 1 MG/ML IV SOLN
INTRAVENOUS | Status: DC
  Administered 2023-12-11: 3.3 mg via INTRAVENOUS
  Administered 2023-12-11: 3.6 mg via INTRAVENOUS
  Administered 2023-12-11: 30 mg via INTRAVENOUS
  Administered 2023-12-11: 4.2 mg via INTRAVENOUS
  Administered 2023-12-12: 30 mg via INTRAVENOUS
  Administered 2023-12-12: 4.2 mg via INTRAVENOUS
  Administered 2023-12-12: 2.4 mg via INTRAVENOUS
  Administered 2023-12-12: 3.3 mg via INTRAVENOUS
  Administered 2023-12-12: 2.7 mg via INTRAVENOUS
  Administered 2023-12-12 (×2): 3.9 mg via INTRAVENOUS
  Filled 2023-12-11: qty 30

## 2023-12-11 MED ORDER — ONDANSETRON HCL 4 MG/2ML IJ SOLN
4.0000 mg | Freq: Once | INTRAMUSCULAR | Status: AC
  Administered 2023-12-11: 4 mg via INTRAVENOUS
  Filled 2023-12-11: qty 2

## 2023-12-11 MED ORDER — ONDANSETRON HCL 4 MG/2ML IJ SOLN
4.0000 mg | Freq: Four times a day (QID) | INTRAMUSCULAR | Status: DC | PRN
  Administered 2023-12-12 – 2023-12-13 (×4): 4 mg via INTRAVENOUS
  Filled 2023-12-11 (×4): qty 2

## 2023-12-11 MED ORDER — HYDROMORPHONE HCL 1 MG/ML IJ SOLN
1.0000 mg | INTRAMUSCULAR | Status: AC
  Administered 2023-12-11: 1 mg via INTRAVENOUS

## 2023-12-11 NOTE — ED Notes (Signed)
Pt given sandwich tray and apple juice.  

## 2023-12-11 NOTE — ED Notes (Signed)
 Called [facility] for a transfer for pt in rm#5, to [facility] long location per Dr Cyrena. Spoke with Ruby at [facility] at 3:17am and she will have someone call back for a consult .

## 2023-12-11 NOTE — Plan of Care (Signed)
  Problem: Education: Goal: Knowledge of General Education information will improve Description: Including pain rating scale, medication(s)/side effects and non-pharmacologic comfort measures Outcome: Progressing   Problem: Health Behavior/Discharge Planning: Goal: Ability to manage health-related needs will improve Outcome: Progressing   Problem: Clinical Measurements: Goal: Ability to maintain clinical measurements within normal limits will improve Outcome: Progressing Goal: Will remain free from infection Outcome: Progressing Goal: Diagnostic test results will improve Outcome: Progressing   Problem: Activity: Goal: Risk for activity intolerance will decrease Outcome: Progressing   Problem: Nutrition: Goal: Adequate nutrition will be maintained Outcome: Progressing   Problem: Coping: Goal: Level of anxiety will decrease Outcome: Progressing   Problem: Pain Managment: Goal: General experience of comfort will improve and/or be controlled Outcome: Progressing   Problem: Safety: Goal: Ability to remain free from injury will improve Outcome: Progressing   Problem: Skin Integrity: Goal: Risk for impaired skin integrity will decrease Outcome: Progressing   Problem: Education: Goal: Knowledge of vaso-occlusive preventative measures will improve Outcome: Progressing Goal: Awareness of infection prevention will improve Outcome: Progressing Goal: Awareness of signs and symptoms of anemia will improve Outcome: Progressing Goal: Long-term complications will improve Outcome: Progressing   Problem: Self-Care: Goal: Ability to incorporate actions that prevent/reduce pain crisis will improve Outcome: Progressing   Problem: Tissue Perfusion: Goal: Complications related to inadequate tissue perfusion will be avoided or minimized Outcome: Progressing   Problem: Fluid Volume: Goal: Ability to maintain a balanced intake and output will improve Outcome: Progressing    Problem: Sensory: Goal: Pain level will decrease with appropriate interventions Outcome: Progressing   Problem: Health Behavior: Goal: Postive changes in compliance with treatment and prescription regimens will improve Outcome: Progressing

## 2023-12-11 NOTE — Progress Notes (Signed)
 Plan of Care Note for accepted transfer   Patient: Bailey Reese MRN: [id number]   DOA: 12/10/2023  Facility requesting transfer: [facility]   Requesting Provider: Dr. Cyrena   Reason for transfer: Sickle cell pain crisis, syncope   Facility course: 18 yr old female with sickle cell disease, asthma, depression, anxiety, and PTSD presents with chest pain, back pain, arthralgias, and reports having a syncopal episode.   She felt weak and lightheaded and had a brief witnessed loss of consciousness without head trauma at church yesterday.   She is afebrile with stable vitals. Notable labs include total bilirubin 6.2, WBC 14.2, Hgb 8.8, and d-dimer 2.49. CTA chest is negative for PE or other acute findings.   She was given Dilaudid  x3, Toradol , IVF, DuoNeb, Zofran , and Benadryl  in the ED.   Plan of care: The patient is accepted for admission to Med-surg  unit, at [facility].   Author: Evalene GORMAN Sprinkles, MD 12/11/2023  Check [email or web address] for on-call coverage.  Nursing staff, Please call [facility] Admits & Consults System-Wide number on Amion as soon as patient's arrival, so appropriate admitting provider can evaluate the pt.

## 2023-12-11 NOTE — ED Notes (Signed)
 Pt up to bedside toilet to void and back to bed with call bell within reach.

## 2023-12-11 NOTE — ED Provider Notes (Signed)
 Asked to follow-up on CT angiogram which was negative.  Patient stable.  Pain refractory to multiple doses of IV Dilaudid , ketorolac , standard emergency pain management.  She would like to be transferred so I have made a consultation call to [facility].   Cyrena Mylar, MD 12/11/23 [phone]

## 2023-12-11 NOTE — ED Notes (Signed)
 Ed provider at bedside to talk with pt about plan of care.

## 2023-12-11 NOTE — ED Notes (Signed)
 Pt to CT

## 2023-12-11 NOTE — TOC Initial Note (Signed)
 Transition of Care [facility]) - Initial/Assessment Note    Patient Details  Name: Bailey Reese MRN: [id number] Date of Birth: Oct 22, 2005  Transition of Care [facility]) CM/SW Contact:    Doneta Glenys DASEN, RN Phone Number: 12/11/2023, 3:15 PM  Clinical Narrative:                 Present for a syncopal episode. Recent discharge, PTA lives in a house with parent and sibling;DME-walker and ooxygen for sleep; not aware of the agency that is supplying oxygen; denies HH or SDOH needs; Patient states her mother or grandmother will transport home at discharge. CM will follow progression for discharge.  Expected Discharge Plan: Home/Self Care Barriers to Discharge: Continued Medical Work up   Patient Goals and CMS Choice Patient states their goals for this hospitalization and ongoing recovery are:: Return home and feel better CMS Medicare.gov Compare Post Acute Care list provided to::  (NA) Choice offered to / list presented to : NA Tangent ownership interest in [facility].provided to:: Parent NA    Expected Discharge Plan and Services In-house Referral: NA Discharge Planning Services: CM Consult   Living arrangements for the past 2 months: Single Family Home                 DME Arranged: N/A DME Agency: NA       HH Arranged: NA HH Agency: NA        Prior Living Arrangements/Services Living arrangements for the past 2 months: Single Family Home Lives with:: Relatives, Parents, Siblings Patient language and need for interpreter reviewed:: Yes Do you feel safe going back to the place where you live?: Yes      Need for Family Participation in Patient Care: No (Comment) Care giver support system in place?: Yes (comment) Current home services: DME (walker and home oxygen (agency ?)) Criminal Activity/Legal Involvement Pertinent to Current Situation/Hospitalization: No - Comment as needed  Activities of Daily Living   ADL Screening (condition at time of  admission) Independently performs ADLs?: Yes (appropriate for developmental age) Is the patient deaf or have difficulty hearing?: No Does the patient have difficulty seeing, even when wearing glasses/contacts?: No Does the patient have difficulty concentrating, remembering, or making decisions?: No  Permission Sought/Granted Permission sought to share information with : Case Manager, Family Supports Permission granted to share information with : Yes, Verbal Permission Granted              Emotional Assessment Appearance:: Appears stated age Attitude/Demeanor/Rapport: Engaged Affect (typically observed): Appropriate Orientation: : Oriented to Self, Oriented to Place, Oriented to  Time, Oriented to Situation Alcohol  / Substance Use: Not Applicable Psych Involvement: No (comment)  Admission diagnosis:  Sickle cell anemia with pain (HCC) [D57.00] Patient Active Problem List   Diagnosis Date Noted   Anemia of chronic disease 11/21/2023   Sickle cell anemia with pain (HCC) 08/21/2023   Avascular necrosis of hip, right (HCC) 07/23/2023   GI bleed 07/03/2023   Vasoocclusive sickle cell crisis (HCC) 07/02/2023   Sickle cell anemia with crisis (HCC) 05/28/2023   Chronic pain syndrome 05/28/2023   Leucocytosis 05/28/2023   Thrombocytosis 05/28/2023   Hyperbilirubinemia 05/08/2023   GAD (generalized anxiety disorder) 03/29/2023   Asthma 12/23/2022   Red blood cell antibody positive 09/24/2022   Obstructive sleep apnea syndrome 02/04/2022   Mild intermittent asthma without complication 02/04/2022   Environmental allergies 02/04/2022   Allergic rhinitis 08/12/2020   Vitamin D insufficiency 06/20/2018   Family dysfunction  Sickle cell crisis (HCC) 08/23/2017   Sickle cell pain crisis (HCC) 08/23/2017   PCP:  Morgan Slater Pizza, MD Pharmacy:   [facility] [address] ([address]), [place] - [address] [place] [place]) [place] [address] Phone:  [phone] Fax: [phone]     Social Drivers of Health (SDOH) Social History: SDOH Screenings   Food Insecurity: No Food Insecurity (12/11/2023)  Housing: Low Risk  (12/11/2023)  Transportation Needs: No Transportation Needs (12/11/2023)  Utilities: Not At Risk (12/11/2023)  Financial Resource Strain: Low Risk  (07/25/2023)   Received from [facility]  Physical Activity: Insufficiently Active (12/10/2021)   Received from [facility]  Social Connections: Socially Isolated (11/20/2023)  Stress: Stress Concern Present (12/10/2021)   [facility] from [facility]  Tobacco Use: Low Risk  (12/11/2023)   SDOH Interventions:     Readmission Risk Interventions    12/11/2023    3:08 PM 12/04/2023    3:56 PM 11/21/2023   12:30 PM  Readmission Risk Prevention Plan  Transportation Screening Complete Complete Complete  Medication Review (RN Care Manager) Referral to Pharmacy Complete Complete  PCP or Specialist appointment within 3-5 days of discharge Complete Complete Complete  HRI or Home Care Consult Complete Complete Complete  SW Recovery Care/Counseling Consult Patient refused Complete Complete  Palliative Care Screening Not Applicable Not Applicable Not Applicable  Skilled Nursing Facility Not Applicable  Not Applicable

## 2023-12-11 NOTE — Progress Notes (Cosign Needed Addendum)
 Patient ID: Bailey Reese, female   DOB: 2005-11-30, 18 y.o.   MRN: [id number] Subjective: Bailey Reese  is a 18 y.o. female with medical history significant of sickle cell disease, PTSD, depression with anxiety who usually gets her care at [facility] but was seen at [facility] ER for generalize body aches, Nausea and vomiting. She is admitted to [facility] for sickle cell pain management.     Patient is alert and oriented and reports pain of 6/10 today.  With no new concerns.  She denies cough, fever, headaches, diarrhea.  No urinary symptoms.  Objective:  Vital signs in last 24 hours:  Vitals:   12/11/23 0535 12/11/23 0630 12/11/23 1128 12/11/23 1148  BP: 118/73     Pulse: 72     Resp: 16  16 18   Temp: 98.2 F (36.8 C)     TempSrc: Oral     SpO2: 100%     Weight:  68.3 kg    Height:  5' 4 (1.626 m)      Intake/Output from previous day:  No intake or output data in the 24 hours ending 12/11/23 1410    Physical Exam: General: Alert, awake, oriented x3, in no acute distress.  HEENT: [place]/AT PEERL, EOMI Neck: Trachea midline,  no masses, no thyromegal,y no JVD, no carotid bruit OROPHARYNX:  Moist, No exudate/ erythema/lesions.  Heart: Regular rate and rhythm, without murmurs, rubs, gallops, PMI non-displaced, no heaves or thrills on palpation.  Lungs: Clear to auscultation, no wheezing or rhonchi noted. No increased vocal fremitus resonant to percussion  Abdomen: Soft, nontender, nondistended, positive bowel sounds, no masses no hepatosplenomegaly noted..  Neuro: No focal neurological deficits noted cranial nerves II through XII grossly intact. DTRs 2+ bilaterally upper and lower extremities. Strength 5 out of 5 in bilateral upper and lower extremities. Musculoskeletal: generalize body tenderness.  Psychiatric: Patient alert and oriented x3, good insight and cognition, good recent to remote recall. Lymph node survey: No cervical axillary or inguinal  lymphadenopathy noted.  Lab Results:  Basic Metabolic Panel:    Component Value Date/Time   NA 136 12/10/2023 2143   NA 135 05/01/2013 1440   K 3.6 12/10/2023 2143   K 4.3 05/01/2013 1440   CL 105 12/10/2023 2143   CL 102 05/01/2013 1440   CO2 20 (L) 12/10/2023 2143   CO2 27 (H) 05/01/2013 1440   BUN 12 12/10/2023 2143   BUN 13 05/01/2013 1440   CREATININE 0.49 12/11/2023 1025   CREATININE 0.40 (L) 05/01/2013 1440   GLUCOSE 94 12/10/2023 2143   GLUCOSE 108 (H) 05/01/2013 1440   CALCIUM 9.5 12/10/2023 2143   CALCIUM 9.8 05/01/2013 1440   CBC:    Component Value Date/Time   WBC 8.0 12/11/2023 1025   HGB 8.6 (L) 12/11/2023 1025   HGB 6.6 (L) 05/01/2013 1440   HCT 25.8 (L) 12/11/2023 1025   HCT 20.1 (L) 05/01/2013 1440   PLT 468 (H) 12/11/2023 1025   PLT 468 (H) 05/01/2013 1440   MCV 98.5 12/11/2023 1025   MCV 89 05/01/2013 1440   NEUTROABS 9.1 (H) 12/10/2023 2143   NEUTROABS 7.9 05/30/2012 1958   LYMPHSABS 2.5 12/10/2023 2143   LYMPHSABS 5.0 05/30/2012 1958   MONOABS 2.3 (H) 12/10/2023 2143   MONOABS 1.6 (H) 05/30/2012 1958   EOSABS 0.1 12/10/2023 2143   EOSABS 0.6 05/30/2012 1958   BASOSABS 0.1 12/10/2023 2143   BASOSABS 3 04/28/2013 1736   BASOSABS 0.2 (H) 05/30/2012 1958  Recent Results (from the past 240 hours)  Resp panel by RT-PCR (RSV, Flu A&B, Covid) Anterior Nasal Swab     Status: None   Collection Time: 12/01/23  8:47 PM   Specimen: Anterior Nasal Swab  Result Value Ref Range Status   SARS Coronavirus 2 by RT PCR NEGATIVE NEGATIVE Final    Comment: (NOTE) SARS-CoV-2 target nucleic acids are NOT DETECTED.  The SARS-CoV-2 RNA is generally detectable in upper respiratory specimens during the acute phase of infection. The lowest concentration of SARS-CoV-2 viral copies this assay can detect is 138 copies/mL. A negative result does not preclude SARS-Cov-2 infection and should not be used as the sole basis for treatment or other patient management  decisions. A negative result may occur with  improper specimen collection/handling, submission of specimen other than nasopharyngeal swab, presence of viral mutation(s) within the areas targeted by this assay, and inadequate number of viral copies(<138 copies/mL). A negative result must be combined with clinical observations, patient history, and epidemiological information. The expected result is Negative.  Fact Sheet for Patients:  BloggerCourse.com  Fact Sheet for Healthcare Providers:  SeriousBroker.it  This test is no t yet approved or cleared by the [place]  FDA and  has been authorized for detection and/or diagnosis of SARS-CoV-2 by FDA under an Emergency Use Authorization (EUA). This EUA will remain  in effect (meaning this test can be used) for the duration of the COVID-19 declaration under Section 564(b)(1) of the Act, 21 U.S.C.section 360bbb-3(b)(1), unless the authorization is terminated  or revoked sooner.       Influenza A by PCR NEGATIVE NEGATIVE Final   Influenza B by PCR NEGATIVE NEGATIVE Final    Comment: (NOTE) The Xpert Xpress SARS-CoV-2/FLU/RSV plus assay is intended as an aid in the diagnosis of influenza from Nasopharyngeal swab specimens and should not be used as a sole basis for treatment. Nasal washings and aspirates are unacceptable for Xpert Xpress SARS-CoV-2/FLU/RSV testing.  Fact Sheet for Patients: BloggerCourse.com  Fact Sheet for Healthcare Providers: SeriousBroker.it  This test is not yet approved or cleared by the [place]  FDA and has been authorized for detection and/or diagnosis of SARS-CoV-2 by FDA under an Emergency Use Authorization (EUA). This EUA will remain in effect (meaning this test can be used) for the duration of the COVID-19 declaration under Section 564(b)(1) of the Act, 21 U.S.C. section 360bbb-3(b)(1), unless the  authorization is terminated or revoked.     Resp Syncytial Virus by PCR NEGATIVE NEGATIVE Final    Comment: (NOTE) Fact Sheet for Patients: BloggerCourse.com  Fact Sheet for Healthcare Providers: SeriousBroker.it  This test is not yet approved or cleared by the [place]  FDA and has been authorized for detection and/or diagnosis of SARS-CoV-2 by FDA under an Emergency Use Authorization (EUA). This EUA will remain in effect (meaning this test can be used) for the duration of the COVID-19 declaration under Section 564(b)(1) of the Act, 21 U.S.C. section 360bbb-3(b)(1), unless the authorization is terminated or revoked.  Performed at [facility], [address] [place], [place] [address]   Group A Strep by PCR [facility] Only)     Status: None   Collection Time: 12/01/23  8:47 PM   Specimen: Throat; Sterile Swab  Result Value Ref Range Status   Group A Strep by PCR NOT DETECTED NOT DETECTED Final    Comment: Performed at [facility], [address] [place], [place] [address]    Studies/Results: CT Angio Chest PE W and/or Wo  Contrast Result Date: 12/11/2023 CLINICAL DATA:  History of sickle cell disease with chest pain, initial encounter EXAM: CT ANGIOGRAPHY CHEST WITH CONTRAST TECHNIQUE: Multidetector CT imaging of the chest was performed using the standard protocol during bolus administration of intravenous contrast. Multiplanar CT image reconstructions and MIPs were obtained to evaluate the vascular anatomy. RADIATION DOSE REDUCTION: This exam was performed according to the departmental dose-optimization program which includes automated exposure control, adjustment of the mA and/or kV according to patient size and/or use of iterative reconstruction technique. CONTRAST:  75mL OMNIPAQUE  IOHEXOL  350 MG/ML SOLN COMPARISON:  05/27/2023 FINDINGS: Cardiovascular: Thoracic aorta and its branches are within normal limits.  Heart is mildly prominent but stable. The pulmonary artery shows a normal branching pattern bilaterally. No filling defect to suggest pulmonary embolism is noted. Mediastinum/Nodes: Thoracic inlet is within normal limits. No hilar or mediastinal adenopathy is noted. The esophagus as visualized is within normal limits. Lungs/Pleura: Lungs are well aerated bilaterally. No focal infiltrate or sizable effusion is seen. No sizable nodule is seen. Upper Abdomen: Visualized upper abdomen is within normal limits. Musculoskeletal: No acute abnormality is noted. Mild endplate changes consistent with sickle cell are noted. Review of the MIP images confirms the above findings. IMPRESSION: No evidence of pulmonary emboli. No acute abnormality noted. Electronically Signed   By: Oneil Devonshire M.D.   On: 12/11/2023 01:12   DG Chest Port 1 View Result Date: 12/10/2023 CLINICAL DATA:  Chest pain.  Sickle cell crisis. EXAM: PORTABLE CHEST 1 VIEW COMPARISON:  12/01/2023 FINDINGS: The cardiac silhouette, mediastinal and hilar contours are within normal limits given the AP projection and portable technique. The lungs are clear. No pleural effusions. The bony thorax is intact. IMPRESSION: No acute cardiopulmonary findings. Electronically Signed   By: MYRTIS Stammer M.D.   On: 12/10/2023 21:59      Medications: Scheduled Meds:  enoxaparin  (LOVENOX ) injection  40 mg Subcutaneous Daily   HYDROmorphone    Intravenous Q4H   senna-docusate  1 tablet Oral BID   Continuous Infusions:  sodium chloride  125 mL/hr at 12/11/23 1126   PRN Meds:.diphenhydrAMINE , naloxone  **AND** sodium chloride  flush, ondansetron  (ZOFRAN ) IV, polyethylene glycol  Consultants: None   Procedures: None  Antibiotics: None  Assessment/Plan: Principal Problem:   Sickle cell anemia with pain (HCC) Active Problems:   Chronic pain syndrome   Leucocytosis   GAD (generalized anxiety disorder)   Mild intermittent asthma without complication   Hb  Sickle Cell Disease with Pain crisis: Continue hydration IVF 0.45% Saline at 125 mL/h continue weight based Dilaudid  PCA, continue oral home pain medications as ordered. Monitor vitals very closely, Re-evaluate pain scale regularly, 2 L of Oxygen by [place]. Patient encouraged to ambulate on the hallway today.  Leukocytosis: Stable Anemia of Chronic Disease: Hemoglobin is lower than patients baseline at 8.6. g/dl.  No need for transfusion at this time.  Will continue to monitor daily CBC.   Chronic pain Syndrome: continue oral home pain medication as ordered.  Mild intermittent asthma without complication: Stable, no acute excerebration. Continue Symbicort as needed  GAD: Stable, continue medication as prescribed  Code Status: Full Code Family Communication: N/A Disposition Plan: ready for discharge in the AM  [name] Cover NP   If 7PM-7AM, please contact night-coverage.  12/11/2023, 2:10 PM  LOS: 0 days

## 2023-12-11 NOTE — ED Notes (Signed)
 Pt consent for transfer signed

## 2023-12-12 LAB — CBC
HCT: 24 % — ABNORMAL LOW (ref 36.0–46.0)
Hemoglobin: 8 g/dL — ABNORMAL LOW (ref 12.0–15.0)
MCH: 32.3 pg (ref 26.0–34.0)
MCHC: 33.3 g/dL (ref 30.0–36.0)
MCV: 96.8 fL (ref 80.0–100.0)
Platelets: 435 K/uL — ABNORMAL HIGH (ref 150–400)
RBC: 2.48 MIL/uL — ABNORMAL LOW (ref 3.87–5.11)
RDW: 19.7 % — ABNORMAL HIGH (ref 11.5–15.5)
WBC: 8.4 K/uL (ref 4.0–10.5)
nRBC: 0.5 % — ABNORMAL HIGH (ref 0.0–0.2)

## 2023-12-12 MED ORDER — METOCLOPRAMIDE HCL 5 MG PO TABS
5.0000 mg | ORAL_TABLET | Freq: Three times a day (TID) | ORAL | Status: DC | PRN
  Administered 2023-12-12: 5 mg via ORAL
  Filled 2023-12-12: qty 1

## 2023-12-12 MED ORDER — HYDROXYUREA 500 MG PO CAPS
500.0000 mg | ORAL_CAPSULE | Freq: Two times a day (BID) | ORAL | Status: DC
  Administered 2023-12-12 – 2023-12-13 (×3): 500 mg via ORAL
  Filled 2023-12-12 (×3): qty 1

## 2023-12-12 NOTE — Progress Notes (Signed)
 Patient ID: Bailey Reese, female   DOB: [birth year], 18 y.o.   MRN: [id number] Subjective: Bailey Reese  is a 18 y.o. female with medical history significant of sickle cell disease, PTSD, depression with anxiety who usually gets her care at [facility] but was seen at [facility] for generalize body aches, Nausea and vomiting. She is admitted to [facility] for sickle cell pain management.     Patient is alert and oriented and reports improved pain of 5/10 today.  With no new concerns.  She denies cough, fever, headaches, diarrhea.  No urinary symptoms.  Objective:  Vital signs in last 24 hours:  Vitals:   12/12/23 0758 12/12/23 1043 12/12/23 1223 12/12/23 1356  BP:  113/62  (!) 119/58  Pulse:  71  78  Resp: 15 17 18 15   Temp:  99.1 F (37.3 C)  98.4 F (36.9 C)  TempSrc:  Oral  Oral  SpO2: 99% 98% 98% 100%  Weight:      Height:        Intake/Output from previous day:   Intake/Output Summary (Last 24 hours) at 12/12/2023 1556 Last data filed at 12/11/2023 1609 Gross per 24 hour  Intake 649.81 ml  Output --  Net 649.81 ml      Physical Exam: General: Alert, awake, oriented x3, in no acute distress.  HEENT: [place]/AT PEERL, EOMI Neck: Trachea midline,  no masses, no thyromegal,y no JVD, no carotid bruit OROPHARYNX:  Moist, No exudate/ erythema/lesions.  Heart: Regular rate and rhythm, without murmurs, rubs, gallops, PMI non-displaced, no heaves or thrills on palpation.  Lungs: Clear to auscultation, no wheezing or rhonchi noted. No increased vocal fremitus resonant to percussion  Abdomen: Soft, nontender, nondistended, positive bowel sounds, no masses no hepatosplenomegaly noted..  Neuro: No focal neurological deficits noted cranial nerves II through XII grossly intact. DTRs 2+ bilaterally upper and lower extremities. Strength 5 out of 5 in bilateral upper and lower extremities. Musculoskeletal: generalize body tenderness.  Psychiatric: Patient alert and  oriented x3, good insight and cognition, good recent to remote recall. Lymph node survey: No cervical axillary or inguinal lymphadenopathy noted.  Lab Results:  Basic Metabolic Panel:    Component Value Date/Time   NA 136 12/10/2023 2143   NA 135 05/01/2013 1440   K 3.6 12/10/2023 2143   K 4.3 05/01/2013 1440   CL 105 12/10/2023 2143   CL 102 05/01/2013 1440   CO2 20 (L) 12/10/2023 2143   CO2 27 (H) 05/01/2013 1440   BUN 12 12/10/2023 2143   BUN 13 05/01/2013 1440   CREATININE 0.49 12/11/2023 1025   CREATININE 0.40 (L) 05/01/2013 1440   GLUCOSE 94 12/10/2023 2143   GLUCOSE 108 (H) 05/01/2013 1440   CALCIUM 9.5 12/10/2023 2143   CALCIUM 9.8 05/01/2013 1440   CBC:    Component Value Date/Time   WBC 8.4 12/12/2023 0553   HGB 8.0 (L) 12/12/2023 0553   HGB 6.6 (L) 05/01/2013 1440   HCT 24.0 (L) 12/12/2023 0553   HCT 20.1 (L) 05/01/2013 1440   PLT 435 (H) 12/12/2023 0553   PLT 468 (H) 05/01/2013 1440   MCV 96.8 12/12/2023 0553   MCV 89 05/01/2013 1440   NEUTROABS 9.1 (H) 12/10/2023 2143   NEUTROABS 7.9 05/30/2012 1958   LYMPHSABS 2.5 12/10/2023 2143   LYMPHSABS 5.0 05/30/2012 1958   MONOABS 2.3 (H) 12/10/2023 2143   MONOABS 1.6 (H) 05/30/2012 1958   EOSABS 0.1 12/10/2023 2143   EOSABS 0.6 05/30/2012  1958   BASOSABS 0.1 12/10/2023 2143   BASOSABS 3 04/28/2013 1736   BASOSABS 0.2 (H) 05/30/2012 1958    No results found for this or any previous visit (from the past 240 hours).   Studies/Results: CT Angio Chest PE W and/or Wo Contrast Result Date: 12/11/2023 CLINICAL DATA:  History of sickle cell disease with chest pain, initial encounter EXAM: CT ANGIOGRAPHY CHEST WITH CONTRAST TECHNIQUE: Multidetector CT imaging of the chest was performed using the standard protocol during bolus administration of intravenous contrast. Multiplanar CT image reconstructions and MIPs were obtained to evaluate the vascular anatomy. RADIATION DOSE REDUCTION: This exam was performed according  to the departmental dose-optimization program which includes automated exposure control, adjustment of the mA and/or kV according to patient size and/or use of iterative reconstruction technique. CONTRAST:  75mL OMNIPAQUE  IOHEXOL  350 MG/ML SOLN COMPARISON:  05/27/2023 FINDINGS: Cardiovascular: Thoracic aorta and its branches are within normal limits. Heart is mildly prominent but stable. The pulmonary artery shows a normal branching pattern bilaterally. No filling defect to suggest pulmonary embolism is noted. Mediastinum/Nodes: Thoracic inlet is within normal limits. No hilar or mediastinal adenopathy is noted. The esophagus as visualized is within normal limits. Lungs/Pleura: Lungs are well aerated bilaterally. No focal infiltrate or sizable effusion is seen. No sizable nodule is seen. Upper Abdomen: Visualized upper abdomen is within normal limits. Musculoskeletal: No acute abnormality is noted. Mild endplate changes consistent with sickle cell are noted. Review of the MIP images confirms the above findings. IMPRESSION: No evidence of pulmonary emboli. No acute abnormality noted. Electronically Signed   By: Oneil Devonshire M.D.   On: 12/11/2023 01:12   DG Chest Port 1 View Result Date: 12/10/2023 CLINICAL DATA:  Chest pain.  Sickle cell crisis. EXAM: PORTABLE CHEST 1 VIEW COMPARISON:  12/01/2023 FINDINGS: The cardiac silhouette, mediastinal and hilar contours are within normal limits given the AP projection and portable technique. The lungs are clear. No pleural effusions. The bony thorax is intact. IMPRESSION: No acute cardiopulmonary findings. Electronically Signed   By: MYRTIS Stammer M.D.   On: 12/10/2023 21:59      Medications: Scheduled Meds:  enoxaparin  (LOVENOX ) injection  40 mg Subcutaneous Daily   HYDROmorphone    Intravenous Q4H   hydroxyurea   500 mg Oral BID   senna-docusate  1 tablet Oral BID   Continuous Infusions:   PRN Meds:.diphenhydrAMINE , naloxone  **AND** sodium chloride  flush,  ondansetron  (ZOFRAN ) IV, polyethylene glycol  Consultants: None   Procedures: None  Antibiotics: None  Assessment/Plan: Principal Problem:   Sickle cell anemia with pain (HCC) Active Problems:   Chronic pain syndrome   Leucocytosis   GAD (generalized anxiety disorder)   Mild intermittent asthma without complication   Hb Sickle Cell Disease with Pain crisis: Continue hydration IVF 0.45% Saline at 125 mL/h continue weight based Dilaudid  PCA, continue oral home pain medications as ordered. Monitor vitals very closely, Re-evaluate pain scale regularly, 2 L of Oxygen by [place]. Patient encouraged to ambulate on the hallway today.  Leukocytosis: Stable Anemia of Chronic Disease: Hemoglobin is lower than patients baseline at 8.0. g/dl.  No need for transfusion at this time.  Will continue to monitor daily CBC.   Chronic pain Syndrome: continue oral home pain medication as ordered.  Mild intermittent asthma without complication: Stable, no acute excerebration. Continue Symbicort as needed  GAD: Stable, continue medication as prescribed  Code Status: Full Code Family Communication: N/A Disposition Plan: ready for discharge in the AM  [name] Cover NP  If 7PM-7AM, please contact night-coverage.  12/12/2023, 3:56 PM  LOS: 1 day

## 2023-12-12 NOTE — Plan of Care (Signed)
  Problem: Education: Goal: Knowledge of General Education information will improve Description: Including pain rating scale, medication(s)/side effects and non-pharmacologic comfort measures Outcome: Progressing   Problem: Health Behavior/Discharge Planning: Goal: Ability to manage health-related needs will improve Outcome: Progressing   Problem: Clinical Measurements: Goal: Ability to maintain clinical measurements within normal limits will improve Outcome: Progressing Goal: Will remain free from infection Outcome: Progressing Goal: Diagnostic test results will improve Outcome: Progressing   Problem: Activity: Goal: Risk for activity intolerance will decrease Outcome: Progressing   Problem: Nutrition: Goal: Adequate nutrition will be maintained Outcome: Progressing   Problem: Coping: Goal: Level of anxiety will decrease Outcome: Progressing   Problem: Pain Managment: Goal: General experience of comfort will improve and/or be controlled Outcome: Progressing   Problem: Safety: Goal: Ability to remain free from injury will improve Outcome: Progressing   Problem: Skin Integrity: Goal: Risk for impaired skin integrity will decrease Outcome: Progressing   Problem: Education: Goal: Knowledge of vaso-occlusive preventative measures will improve Outcome: Progressing Goal: Awareness of infection prevention will improve Outcome: Progressing Goal: Awareness of signs and symptoms of anemia will improve Outcome: Progressing Goal: Long-term complications will improve Outcome: Progressing   Problem: Self-Care: Goal: Ability to incorporate actions that prevent/reduce pain crisis will improve Outcome: Progressing   Problem: Tissue Perfusion: Goal: Complications related to inadequate tissue perfusion will be avoided or minimized Outcome: Progressing   Problem: Fluid Volume: Goal: Ability to maintain a balanced intake and output will improve Outcome: Progressing    Problem: Sensory: Goal: Pain level will decrease with appropriate interventions Outcome: Progressing   Problem: Health Behavior: Goal: Postive changes in compliance with treatment and prescription regimens will improve Outcome: Progressing

## 2023-12-13 ENCOUNTER — Encounter (HOSPITAL_COMMUNITY): Payer: Self-pay | Admitting: Internal Medicine

## 2023-12-13 LAB — BASIC METABOLIC PANEL WITH GFR
Anion gap: 10 (ref 5–15)
BUN: 10 mg/dL (ref 6–20)
CO2: 21 mmol/L — ABNORMAL LOW (ref 22–32)
Calcium: 9.1 mg/dL (ref 8.9–10.3)
Chloride: 103 mmol/L (ref 98–111)
Creatinine, Ser: 0.53 mg/dL (ref 0.44–1.00)
GFR, Estimated: >60 mL/min (ref >60)
Glucose, Bld: 81 mg/dL (ref 70–99)
Potassium: 3.7 mmol/L (ref 3.5–5.1)
Sodium: 134 mmol/L — ABNORMAL LOW (ref 135–145)

## 2023-12-13 LAB — CBC
HCT: 24.5 % — ABNORMAL LOW (ref 36.0–46.0)
Hemoglobin: 8.3 g/dL — ABNORMAL LOW (ref 12.0–15.0)
MCH: 32.7 pg (ref 26.0–34.0)
MCHC: 33.9 g/dL (ref 30.0–36.0)
MCV: 96.5 fL (ref 80.0–100.0)
Platelets: 411 K/uL — ABNORMAL HIGH (ref 150–400)
RBC: 2.54 MIL/uL — ABNORMAL LOW (ref 3.87–5.11)
RDW: 19.1 % — ABNORMAL HIGH (ref 11.5–15.5)
WBC: 9 K/uL (ref 4.0–10.5)
nRBC: 0.6 % — ABNORMAL HIGH (ref 0.0–0.2)

## 2023-12-13 NOTE — Discharge Summary (Signed)
 Physician Discharge Summary  Bailey Reese FMW:[id number] DOB: [birth year] DOA: 12/11/2023  PCP: Morgan Slater Pizza, MD  Admit date: 12/11/2023  Discharge date: 12/13/2023  Discharge Diagnoses:  Principal Problem:   Sickle cell anemia with pain (HCC) Active Problems:   Chronic pain syndrome   Leucocytosis   GAD (generalized anxiety disorder)   Mild intermittent asthma without complication   Discharge Condition: Stable  Disposition:  Pt is discharged home in good condition and is to follow up with [facility], Slater Pizza, MD this week to have labs evaluated. Bailey Reese is instructed to increase activity slowly and balance with rest for the next few days, and use prescribed medication to complete treatment of pain  Diet: Regular Wt Readings from Last 3 Encounters:  12/11/23 68.3 kg (81%, Z= 0.87)*  12/04/23 65.8 kg (75%, Z= 0.69)*  12/01/23 65.8 kg (75%, Z= 0.69)*   * Growth percentiles are based on CDC (Girls, 2-20 Years) data.    History of present illness:  Bailey Reese  is a 18 y.o. female with history of sickle cell disease, PTSD, depression with anxiety who usually gets her care at [facility], patient was recently discharged after admission related to pain crisis.  Patient as well as discharge when she fell weak, went outside tired using her inhaler without improvement.  Patient was lightheaded and had witnessed syncopal episode.  No head trauma.  No chest pain, back pain.  She denied diarrhea, cough, shortness of breath, no urinary symptoms.  She has had previous osteomyelitis and multiple episodes of acute chest syndrome.  Also hemorrhagic stroke in 2017.  Patient has chronic pain from her sickle cell disease.  No recent travels or sick contacts.     ED Course:  Patient was treated in the ED with IVF, IV pain medication with no resolution to symptoms. Chest X-ray is negative.  CT angio chest: Negative no evidence of pulmonary emboli, no acute  abnormality.   Patient admitted for ongoing sickle cell pain management   CO2 20 (*)       Total Protein 8.8 (*)      AST 51 (*)      ALT 95 (*)      Total Bilirubin 6.2 (*)      All other components within normal limits  CBC WITH DIFFERENTIAL/PLATELET - Abnormal; Notable for the following components:    WBC 14.2 (*)      RBC 2.66 (*)      Hemoglobin 8.8 (*)      HCT 25.5 (*)      RDW 20.3 (*)      Platelets 423 (*)      nRBC 1.1 (*)      Neutro Abs 9.1 (*)      Monocytes Absolute 2.3 (*)      All other components within normal limits  RETICULOCYTES - Abnormal; Notable for the following components:    Retic Ct Pct 13.5 (*)      RBC. 2.62 (*)      Retic Count, Absolute 417.5 (*)      Immature Retic Fract 22.3 (*)      All other components within normal limits  D-DIMER, QUANTITATIVE - Abnormal; Notable for the following components:    D-Dimer, Quant 2.49 (*)      Hospital Course:  Patient was admitted for sickle cell pain crisis and managed appropriately with IVF, IV Dilaudid  via PCA and IV Toradol , as well as other adjunct therapies per sickle cell pain  management protocols.  Patient is reporting improved pain today, walking without assistance tolerating PO without nausea or vomiting. Patient will continue to manage chronic pain at home.  She has no new concerns Patient was therefore discharged home today in a hemodynamically stable condition.   Bailynn was counseled extensively about nonpharmacologic means of pain management, patient verbalized understanding and was appreciative of  the care received during this admission.   We discussed the need for good hydration, monitoring of hydration status, avoidance of heat, cold, stress, and infection triggers. We discussed the need to be adherent with taking home medications. Patient was reminded of the need to seek medical attention immediately if any symptom of bleeding, anemia, or infection occurs.  Discharge Exam: Vitals:   12/13/23 0736  12/13/23 0931  BP:  (!) 108/58  Pulse:  77  Resp: 17 20  Temp:  98.7 F (37.1 C)  SpO2: 100% 100%   Vitals:   12/13/23 0353 12/13/23 0626 12/13/23 0736 12/13/23 0931  BP:  (!) 120/56  (!) 108/58  Pulse:  77  77  Resp: 20 18 17 20   Temp:  98.4 F (36.9 C)  98.7 F (37.1 C)  TempSrc:  Oral  Oral  SpO2:  100% 100% 100%  Weight:      Height:        General appearance : Awake, alert, not in any distress. Speech Clear. Not toxic looking HEENT: Atraumatic and Normocephalic, pupils equally reactive to light and accomodation Neck: Supple, no JVD. No cervical lymphadenopathy.  Chest: Good air entry bilaterally, no added sounds  CVS: S1 S2 regular, no murmurs.  Abdomen: Bowel sounds present, Non tender and not distended with no gaurding, rigidity or rebound. Extremities: B/L Lower Ext shows no edema, both legs are warm to touch Neurology: Awake alert, and oriented X 3, CN II-XII intact, Non focal Skin: No Rash  Discharge Instructions  Discharge Instructions     Diet - low sodium heart healthy   Complete by: As directed    Increase activity slowly   Complete by: As directed           The results of significant diagnostics from this hospitalization (including imaging, microbiology, ancillary and laboratory) are listed below for reference.    Significant Diagnostic Studies: CT Angio Chest PE W and/or Wo Contrast Result Date: 12/11/2023 CLINICAL DATA:  History of sickle cell disease with chest pain, initial encounter EXAM: CT ANGIOGRAPHY CHEST WITH CONTRAST TECHNIQUE: Multidetector CT imaging of the chest was performed using the standard protocol during bolus administration of intravenous contrast. Multiplanar CT image reconstructions and MIPs were obtained to evaluate the vascular anatomy. RADIATION DOSE REDUCTION: This exam was performed according to the departmental dose-optimization program which includes automated exposure control, adjustment of the mA and/or kV according to  patient size and/or use of iterative reconstruction technique. CONTRAST:  75mL OMNIPAQUE  IOHEXOL  350 MG/ML SOLN COMPARISON:  05/27/2023 FINDINGS: Cardiovascular: Thoracic aorta and its branches are within normal limits. Heart is mildly prominent but stable. The pulmonary artery shows a normal branching pattern bilaterally. No filling defect to suggest pulmonary embolism is noted. Mediastinum/Nodes: Thoracic inlet is within normal limits. No hilar or mediastinal adenopathy is noted. The esophagus as visualized is within normal limits. Lungs/Pleura: Lungs are well aerated bilaterally. No focal infiltrate or sizable effusion is seen. No sizable nodule is seen. Upper Abdomen: Visualized upper abdomen is within normal limits. Musculoskeletal: No acute abnormality is noted. Mild endplate changes consistent with sickle cell are noted. Review of  the MIP images confirms the above findings. IMPRESSION: No evidence of pulmonary emboli. No acute abnormality noted. Electronically Signed   By: Oneil Devonshire M.D.   On: 12/11/2023 01:12   DG Chest Port 1 View Result Date: 12/10/2023 CLINICAL DATA:  Chest pain.  Sickle cell crisis. EXAM: PORTABLE CHEST 1 VIEW COMPARISON:  12/01/2023 FINDINGS: The cardiac silhouette, mediastinal and hilar contours are within normal limits given the AP projection and portable technique. The lungs are clear. No pleural effusions. The bony thorax is intact. IMPRESSION: No acute cardiopulmonary findings. Electronically Signed   By: MYRTIS Stammer M.D.   On: 12/10/2023 21:59   DG Chest 2 View Result Date: 12/01/2023 CLINICAL DATA:  Chest pain shortness of breath and cough EXAM: CHEST - 2 VIEW COMPARISON:  11/23/2023 FINDINGS: Upper normal cardiac size. No acute airspace disease, pleural effusion or pneumothorax. IMPRESSION: No active cardiopulmonary disease.  Upper normal cardiac size. Electronically Signed   By: Luke Bun M.D.   On: 12/01/2023 21:08   DG Chest Port 1 View Result Date:  11/23/2023 CLINICAL DATA:  [id number].  Chest pain. EXAM: PORTABLE CHEST 1 VIEW COMPARISON:  Portable chest 11/20/2023 FINDINGS: 4:03 a.m. Interval increased cardiac diameter and increased central vascular distension. There is a low inspiration on exam. There is either mild basilar interstitial edema or bronchovascular crowding from low inspiration. The hypoexpanded lungs otherwise appear clear.  The sulci are sharp. The mediastinum is normally outlined. Slight upper thoracic levoscoliosis. A follow-up study in full inspiration is recommended preferably PA and lateral views. IMPRESSION: 1. Interval increased cardiac diameter and increased central vascular distension, but could be exaggerated by low inspiration. 2. Low inspiration on exam with either mild basilar interstitial edema or bronchovascular crowding from low inspiration. 3. A follow-up study in full inspiration is recommended preferably PA and lateral views. Electronically Signed   By: Francis Quam M.D.   On: 11/23/2023 04:42   DG Chest Portable 1 View Result Date: 11/20/2023 CLINICAL DATA:  Chest pain. Back pain and extremity pain for 2 days. Possible sickle cell crisis. EXAM: PORTABLE CHEST 1 VIEW COMPARISON:  10/29/2023 FINDINGS: Shallow inspiration. Heart size and pulmonary vascularity are normal. Lungs are clear. No pleural effusion or pneumothorax. Mediastinal contours appear intact. Visualized bones appear intact. IMPRESSION: No active disease. Electronically Signed   By: Elsie Gravely M.D.   On: 11/20/2023 04:12    Microbiology: No results found for this or any previous visit (from the past 240 hours).    Labs: Basic Metabolic Panel: Recent Labs  Lab 12/07/23 0613 12/10/23 2143 12/11/23 1025 12/13/23 0652  NA 136 136  --  134*  K 4.1 3.6  --  3.7  CL 106 105  --  103  CO2 24 20*  --  21*  GLUCOSE 104* 94  --  81  BUN 8 12  --  10  CREATININE 0.55 0.75 0.49 0.53  CALCIUM 8.9 9.5  --  9.1   Liver Function Tests: Recent Labs   Lab 12/07/23 0613 12/10/23 2143  AST 42* 51*  ALT 58* 95*  ALKPHOS 120 125  BILITOT 5.1* 6.2*  PROT 7.1 8.8*  ALBUMIN 3.3* 4.4    No results for input(s): LIPASE, AMYLASE in the last 168 hours. No results for input(s): AMMONIA in the last 168 hours. CBC: Recent Labs  Lab 12/08/23 0521 12/10/23 2143 12/11/23 1025 12/12/23 0553 12/13/23 0652  WBC 12.0* 14.2* 8.0 8.4 9.0  NEUTROABS  --  9.1*  --   --   --  HGB 9.0* 8.8* 8.6* 8.0* 8.3*  HCT 27.1* 25.5* 25.8* 24.0* 24.5*  MCV 98.9 95.9 98.5 96.8 96.5  PLT 498* 423* 468* 435* 411*   Cardiac Enzymes: No results for input(s): CKTOTAL, CKMB, CKMBINDEX, TROPONINI in the last 168 hours. BNP: Invalid input(s): POCBNP CBG: No results for input(s): GLUCAP in the last 168 hours.  Time coordinating discharge: 50 minutes  Signed:  [name] Cover NP   [facility] 12/13/2023, 4:39 PM

## 2023-12-19 NOTE — H&P (Signed)
 H&P  Patient Demographics:  Bailey Reese, is a 18 y.o. female  MRN: [id number]   DOB - March 12, 2006  Admit Date - 12/11/2023  Outpatient Primary MD for the patient is Little, Slater Pizza, MD  No chief complaint on file.     HPI:   Bailey Reese  is a 18 y.o. female, with history of sickle cell disease, PTSD, depression with anxiety who usually gets her care at [facility], patient was recently discharged after admission related to pain crisis.  Patient as well as discharge when she fell weak, went outside tired using her inhaler without improvement.  Patient was lightheaded and had witnessed syncopal episode.  No head trauma.  No chest pain, back pain.  She denied diarrhea, cough, shortness of breath, no urinary symptoms.  She has had previous osteomyelitis and multiple episodes of acute chest syndrome.  Also hemorrhagic stroke in 2017.  Patient has chronic pain from her sickle cell disease.  No recent travels or sick contacts.       ED Course:  Patient was treated in the ED with IVF, IV pain medication with no resolution to symptoms. Chest X-ray is negative.  CT angio chest: Negative no evidence of pulmonary emboli, no acute  abnormality.  Patient admitted for ongoing sickle cell pain management   CO2 20 (*)        Total Protein 8.8 (*)      AST 51 (*)      ALT 95 (*)      Total Bilirubin 6.2 (*)      All other components within normal limits  CBC WITH DIFFERENTIAL/PLATELET - Abnormal; Notable for the following components:    WBC 14.2 (*)      RBC 2.66 (*)      Hemoglobin 8.8 (*)      HCT 25.5 (*)      RDW 20.3 (*)      Platelets 423 (*)      nRBC 1.1 (*)      Neutro Abs 9.1 (*)      Monocytes Absolute 2.3 (*)      All other components within normal limits  RETICULOCYTES - Abnormal; Notable for the following components:    Retic Ct Pct 13.5 (*)      RBC. 2.62 (*)      Retic Count, Absolute 417.5 (*)      Immature Retic Fract 22.3 (*)      All other components within  normal limits  D-DIMER, QUANTITATIVE - Abnormal; Notable for the following components:    D-Dimer, Quant 2.49 (*)        Review of systems:  In addition to the HPI above, patient reports No fever or chills No Headache, No changes with vision or hearing No problems swallowing food or liquids No chest pain, cough or shortness of breath No abdominal pain, No nausea or vomiting, Bowel movements are regular No blood in stool or urine No dysuria No new skin rashes or bruises No new joints pains-aches No new weakness, tingling, numbness in any extremity No recent weight gain or loss No polyuria, polydypsia or polyphagia No significant Mental Stressors  A full 10 point Review of Systems was done, except as stated above, all other Review of Systems were negative.  With Past History of the following :   Past Medical History:  Diagnosis Date   Anxiety    Depression    PTSD (post-traumatic stress disorder)    Sickle cell anemia (HCC)  Past Surgical History:  Procedure Laterality Date   BIOPSY  07/04/2023   Procedure: BIOPSY;  Surgeon: Saintclair Jasper, MD;  Location: [facility];  Service: Gastroenterology;;   CHOLECYSTECTOMY     ESOPHAGOGASTRODUODENOSCOPY (EGD) WITH PROPOFOL  N/A 07/04/2023   Procedure: ESOPHAGOGASTRODUODENOSCOPY (EGD) WITH PROPOFOL ;  Surgeon: Saintclair Jasper, MD;  Location: [facility];  Service: Gastroenterology;  Laterality: N/A;   WRIST SURGERY       Social History:   Social History   Tobacco Use   Smoking status: Never   Smokeless tobacco: Never  Substance Use Topics   Alcohol  use: No     Lives - At home   Family History :   Family History  Problem Relation Age of Onset   Diabetes Paternal Aunt      Home Medications:   Prior to Admission medications   Medication Sig Start Date End Date Taking? Authorizing Provider  budesonide-formoterol  (SYMBICORT) 80-4.5 MCG/ACT inhaler Inhale 2 puffs into the lungs See admin instructions. Inhale 2 puffs by  mouth twice daily and an extra puff as needed. 12/29/22 12/29/23 Yes [provider]  buprenorphine  (BUTRANS ) 20 MCG/HR PTWK Place 1 patch onto the skin once a week.   Yes [provider]  Calcium Carb-Cholecalciferol (CALCIUM 600+D3 PO) Take 1 tablet by mouth at bedtime.   Yes [provider]  Deferasirox  360 MG TABS Take 360 mg by mouth 2 (two) times daily.   Yes [provider]  ergocalciferol (VITAMIN D2) 1.25 MG (50000 UT) capsule Take 50,000 Units by mouth once a week.   Yes [provider]  esomeprazole  (NEXIUM ) 40 MG capsule Take 1 capsule (40 mg total) by mouth daily as needed (reflux). 08/29/23  Yes Abdo Denault M, NP  fexofenadine (ALLEGRA) 180 MG tablet Take 180 mg by mouth daily as needed for allergies. 12/29/22  Yes [provider]  fluticasone  (FLONASE ) 50 MCG/ACT nasal spray Place 1 spray into both nostrils daily as needed for allergies or rhinitis. 12/29/22  Yes [provider]  hydroxyurea  (HYDREA ) 500 MG capsule Take 500 mg by mouth 2 (two) times daily. May take with food to minimize GI side effects.   Yes [provider]  hydrOXYzine  (ATARAX ) 25 MG tablet Take 25 mg by mouth See admin instructions. Take 25mg  (1 tablet) by mouth twice daily and an extra tablet if needed.   Yes [provider]  lubiprostone  (AMITIZA ) 24 MCG capsule Take 24 mcg by mouth in the morning and at bedtime. 11/12/23  Yes [provider]  ondansetron  (ZOFRAN -ODT) 4 MG disintegrating tablet Take 4 mg by mouth every 8 (eight) hours as needed for nausea or vomiting (dissolve orally).   Yes [provider]  Oxycodone  HCl 10 MG TABS Take 1 tablet (10 mg total) by mouth every 6 (six) hours as needed (pain). 07/05/23  Yes Sim Emery CROME, MD  pantoprazole  (PROTONIX ) 40 MG tablet Take 1 tablet (40 mg total) by mouth 2 (two) times daily. 07/05/23 07/04/24 Yes Sim Emery CROME, MD  prazosin  (MINIPRESS ) 1 MG capsule Take 1 mg  by mouth at bedtime.   Yes [provider]  pregabalin  (LYRICA ) 150 MG capsule Take 150 mg by mouth 3 (three) times daily.   Yes [provider]  prochlorperazine  (COMPAZINE ) 5 MG tablet Take 5 mg by mouth every 6 (six) hours as needed for nausea or vomiting.   Yes [provider]  promethazine  (PHENERGAN ) 12.5 MG tablet Take 12.5 mg by mouth every 6 (six) hours as needed  for nausea or vomiting. 05/10/23  Yes [provider]     Allergies:   Allergies  Allergen Reactions   Cherry Anaphylaxis, Itching and Swelling   Olanzapine  Other (See Comments)    Drug-induced liver injury   Other Swelling and Other (See Comments)    Pitted fruits   Peanut-Containing Drug Products Anaphylaxis, Itching and Swelling   Plum Pulp Anaphylaxis and Hives   Droperidol  Other (See Comments)    Pt states muscle tension, involuntary movements, eye drift, anxiety   Morphine  And Codeine Hives and Itching   Peach [Prunus Persica] Itching, Swelling and Other (See Comments)   Acetaminophen  Nausea And Vomiting   Compazine  [Prochlorperazine ] Other (See Comments) and Hypertension    Patient stated that this medication causes her HR to increase and well as BP. She also states that it cause hallucinations.   Ibuprofen  Nausea And Vomiting     Physical Exam:   Vitals:   Vitals:   12/13/23 0736 12/13/23 0931  BP:  (!) 108/58  Pulse:  77  Resp: 17 20  Temp:  98.7 F (37.1 C)  SpO2: 100% 100%    Physical Exam: Constitutional: Patient appears well-developed and well-nourished. Not in obvious distress. HENT: Normocephalic, atraumatic, External right and left ear normal. Oropharynx is clear and moist.  Eyes: Conjunctivae and EOM are normal. PERRLA, no scleral icterus. Neck: Normal ROM. Neck supple. No JVD. No tracheal deviation. No thyromegaly. CVS: RRR, S1/S2 +, no murmurs, no gallops, no carotid bruit.  Pulmonary: Effort and breath sounds normal, no stridor, rhonchi,  wheezes, rales.  Abdominal: Soft. BS +, no distension, tenderness, rebound or guarding.  Musculoskeletal: Normal range of motion. No edema and no tenderness.  Lymphadenopathy: No lymphadenopathy noted, cervical, inguinal or axillary Neuro: Alert. Normal reflexes, muscle tone coordination. No cranial nerve deficit. Skin: Skin is warm and dry. No rash noted. Not diaphoretic. No erythema. No pallor. Psychiatric: Normal mood and affect. Behavior, judgment, thought content normal.   Data Review:   CBC Recent Labs  Lab 12/13/23 0652  WBC 9.0  HGB 8.3*  HCT 24.5*  PLT 411*  MCV 96.5  MCH 32.7  MCHC 33.9  RDW 19.1*   ------------------------------------------------------------------------------------------------------------------  Chemistries  Recent Labs  Lab 12/13/23 0652  NA 134*  K 3.7  CL 103  CO2 21*  GLUCOSE 81  BUN 10  CREATININE 0.53  CALCIUM 9.1   ------------------------------------------------------------------------------------------------------------------ estimated creatinine clearance is 107.3 mL/min (by C-G formula based on SCr of 0.53 mg/dL). ------------------------------------------------------------------------------------------------------------------ No results for input(s): TSH, T4TOTAL, T3FREE, THYROIDAB in the last 72 hours.  Invalid input(s): FREET3  Coagulation profile No results for input(s): INR, PROTIME in the last 168 hours. ------------------------------------------------------------------------------------------------------------------- No results for input(s): DDIMER in the last 72 hours. -------------------------------------------------------------------------------------------------------------------  Cardiac Enzymes No results for input(s): CKMB, TROPONINI, MYOGLOBIN in the last 168 hours.  Invalid input(s):  CK ------------------------------------------------------------------------------------------------------------------    Component Value Date/Time   BNP 22.4 12/01/2023 2047    ---------------------------------------------------------------------------------------------------------------  Urinalysis    Component Value Date/Time   COLORURINE YELLOW (A) 12/01/2023 2047   APPEARANCEUR HAZY (A) 12/01/2023 2047   APPEARANCEUR Clear 05/01/2013 1440   LABSPEC 1.011 12/01/2023 2047   LABSPEC 1.016 05/01/2013 1440   PHURINE 7.0 12/01/2023 2047   GLUCOSEU NEGATIVE 12/01/2023 2047   GLUCOSEU Negative 05/01/2013 1440   HGBUR NEGATIVE 12/01/2023 2047   BILIRUBINUR NEGATIVE 12/01/2023 2047   BILIRUBINUR Negative 05/01/2013 1440   KETONESUR NEGATIVE 12/01/2023 2047   PROTEINUR NEGATIVE 12/01/2023 2047   NITRITE NEGATIVE  12/01/2023 2047   LEUKOCYTESUR NEGATIVE 12/01/2023 2047   LEUKOCYTESUR Negative 05/01/2013 1440    ----------------------------------------------------------------------------------------------------------------   Imaging Results:    No results found.   Assessment & Plan:  Principal Problem:   Sickle cell anemia with pain (HCC) Active Problems:   Chronic pain syndrome   Leucocytosis   GAD (generalized anxiety disorder)   Mild intermittent asthma without complication   Hb Sickle Cell Disease with crisis: Admit patient, start IVF 0.45% Saline @ 125 mls/hour, start weight based Dilaudid  PCA, start IV Toradol  15 mg Q 6 H, Restart oral home pain medications, Monitor vitals very closely, Re-evaluate pain scale regularly, 2 L of Oxygen by [place], Patient will be re-evaluated for pain in the context of function and relationship to baseline as care progresses. Leukocytosis: Stable  Anemia of Chronic Disease: Hemoglobin stable at patients baseline. Will continue to monitor daily cbc. Chronic pain Syndrome:  continue oral home pain medication as ordered.  Mild intermittent  asthma without complication: Stable, no acute excerebration. Continue Symbicort as needed  GAD: Stable, continue medication as prescribed    DVT Prophylaxis: Subcut Lovenox    AM Labs Ordered, also please review Full Orders  Family Communication: Admission, patient's condition and plan of care including tests being ordered have been discussed with the patient who indicate understanding and agree with the plan and Code Status.  Code Status: Full Code  Consults called: None    Admission status: Inpatient    Time spent in minutes : 50 minutes  [name] Cover NP  12/19/2023 at 3:53 PM

## 2023-12-26 ENCOUNTER — Emergency Department
Admission: EM | Admit: 2023-12-26 | Discharge: 2023-12-26 | Disposition: A | Discharge status: Home / Self Care | Attending: Emergency Medicine | Admitting: Emergency Medicine

## 2023-12-26 ENCOUNTER — Emergency Department

## 2023-12-26 ENCOUNTER — Other Ambulatory Visit: Payer: Self-pay

## 2023-12-26 DIAGNOSIS — D57219 Sickle-cell/Hb-C disease with crisis, unspecified: Secondary | ICD-10-CM | POA: Diagnosis not present

## 2023-12-26 DIAGNOSIS — R059 Cough, unspecified: Secondary | ICD-10-CM | POA: Diagnosis not present

## 2023-12-26 DIAGNOSIS — D5700 Hb-SS disease with crisis, unspecified: Secondary | ICD-10-CM

## 2023-12-26 DIAGNOSIS — M791 Myalgia, unspecified site: Secondary | ICD-10-CM | POA: Diagnosis present

## 2023-12-26 LAB — CBC WITH DIFFERENTIAL/PLATELET
Abs Granulocyte: 6.9 K/uL — ABNORMAL HIGH (ref 1.5–6.5)
Abs Immature Granulocytes: 0.13 K/uL — ABNORMAL HIGH (ref 0.00–0.07)
Basophils Absolute: 0.1 K/uL (ref 0.0–0.1)
Basophils Relative: 1 %
Eosinophils Absolute: 0.5 K/uL (ref 0.0–0.5)
Eosinophils Relative: 4 %
HCT: 20.9 % — ABNORMAL LOW (ref 36.0–46.0)
Hemoglobin: 7.2 g/dL — ABNORMAL LOW (ref 12.0–15.0)
Immature Granulocytes: 1 %
Lymphocytes Relative: 36 %
Lymphs Abs: 5.2 K/uL — ABNORMAL HIGH (ref 0.7–4.0)
MCH: 32.4 pg (ref 26.0–34.0)
MCHC: 34.4 g/dL (ref 30.0–36.0)
MCV: 94.1 fL (ref 80.0–100.0)
Monocytes Absolute: 1.7 K/uL — ABNORMAL HIGH (ref 0.1–1.0)
Monocytes Relative: 12 %
Neutro Abs: 6.9 K/uL (ref 1.7–7.7)
Neutrophils Relative %: 46 %
Platelets: 367 K/uL (ref 150–400)
RBC: 2.22 MIL/uL — ABNORMAL LOW (ref 3.87–5.11)
RDW: 18.8 % — ABNORMAL HIGH (ref 11.5–15.5)
Smear Review: NORMAL
WBC: 14.6 K/uL — ABNORMAL HIGH (ref 4.0–10.5)
nRBC: 6.9 % — ABNORMAL HIGH (ref 0.0–0.2)

## 2023-12-26 LAB — COMPREHENSIVE METABOLIC PANEL WITH GFR
ALT: 33 U/L (ref 0–44)
AST: 45 U/L — ABNORMAL HIGH (ref 15–41)
Albumin: 4.1 g/dL (ref 3.5–5.0)
Alkaline Phosphatase: 108 U/L (ref 38–126)
Anion gap: 14 (ref 5–15)
BUN: 11 mg/dL (ref 6–20)
CO2: 21 mmol/L — ABNORMAL LOW (ref 22–32)
Calcium: 9.3 mg/dL (ref 8.9–10.3)
Chloride: 104 mmol/L (ref 98–111)
Creatinine, Ser: 0.49 mg/dL (ref 0.44–1.00)
GFR, Estimated: >60 mL/min (ref >60)
Glucose, Bld: 99 mg/dL (ref 70–99)
Potassium: 3.5 mmol/L (ref 3.5–5.1)
Sodium: 139 mmol/L (ref 135–145)
Total Bilirubin: 3.9 mg/dL — ABNORMAL HIGH (ref 0.0–1.2)
Total Protein: 7.5 g/dL (ref 6.5–8.1)

## 2023-12-26 LAB — URINALYSIS, ROUTINE W REFLEX MICROSCOPIC
Bilirubin Urine: NEGATIVE
Glucose, UA: NEGATIVE mg/dL
Hgb urine dipstick: NEGATIVE
Ketones, ur: NEGATIVE mg/dL
Leukocytes,Ua: NEGATIVE
Nitrite: NEGATIVE
Protein, ur: NEGATIVE mg/dL
Specific Gravity, Urine: 1.011 (ref 1.005–1.030)
pH: 5 (ref 5.0–8.0)

## 2023-12-26 LAB — TROPONIN I (HIGH SENSITIVITY)
Troponin I (High Sensitivity): 5 ng/L (ref <18)
Troponin I (High Sensitivity): 4 ng/L (ref <18)

## 2023-12-26 LAB — RETICULOCYTES
Immature Retic Fract: 33.8 % — ABNORMAL HIGH (ref 2.3–15.9)
RBC.: 2.05 MIL/uL — ABNORMAL LOW (ref 3.87–5.11)
Retic Count, Absolute: 374.9 K/uL — ABNORMAL HIGH (ref 19.0–186.0)
Retic Ct Pct: 17.1 % — ABNORMAL HIGH (ref 0.4–3.1)

## 2023-12-26 LAB — HCG, QUANTITATIVE, PREGNANCY: hCG, Beta Chain, Quant, S: <1 m[IU]/mL (ref <5)

## 2023-12-26 LAB — LIPASE, BLOOD: Lipase: 28 U/L (ref 11–51)

## 2023-12-26 MED ORDER — HYDROMORPHONE HCL 1 MG/ML IJ SOLN
1.5000 mg | INTRAMUSCULAR | Status: AC | PRN
  Administered 2023-12-26 (×6): 1.5 mg via INTRAVENOUS
  Filled 2023-12-26 (×3): qty 1.5

## 2023-12-26 MED ORDER — KETOROLAC TROMETHAMINE 30 MG/ML IJ SOLN
30.0000 mg | Freq: Once | INTRAMUSCULAR | Status: DC
  Filled 2023-12-26: qty 1

## 2023-12-26 MED ORDER — SODIUM CHLORIDE 0.9 % IV BOLUS
1000.0000 mL | Freq: Once | INTRAVENOUS | Status: AC
  Administered 2023-12-26 (×2): 1000 mL via INTRAVENOUS

## 2023-12-26 MED ORDER — ACETAMINOPHEN 500 MG PO TABS
1000.0000 mg | ORAL_TABLET | Freq: Once | ORAL | Status: AC
  Administered 2023-12-26 (×2): 1000 mg via ORAL
  Filled 2023-12-26: qty 2

## 2023-12-26 MED ORDER — DIPHENHYDRAMINE HCL 50 MG/ML IJ SOLN
25.0000 mg | Freq: Once | INTRAMUSCULAR | Status: AC
  Administered 2023-12-26 (×2): 25 mg via INTRAVENOUS
  Filled 2023-12-26: qty 1

## 2023-12-26 MED ORDER — ONDANSETRON HCL 4 MG/2ML IJ SOLN
4.0000 mg | Freq: Once | INTRAMUSCULAR | Status: AC
  Administered 2023-12-26 (×2): 4 mg via INTRAVENOUS
  Filled 2023-12-26: qty 2

## 2023-12-26 MED ORDER — KETOROLAC TROMETHAMINE 30 MG/ML IJ SOLN
30.0000 mg | Freq: Once | INTRAMUSCULAR | Status: AC
  Administered 2023-12-26 (×2): 30 mg via INTRAVENOUS

## 2023-12-26 NOTE — ED Notes (Signed)
 Pt sitting up in bed, pt awake, resps even and unlabored, pt reports 8/10 pain, pt requests food.

## 2023-12-26 NOTE — ED Provider Notes (Signed)
 Urinalysis without evidence of infection  Spoke via [facility] with NP Cherylene, who knows the patient well.  She advises has reviewed today's labs history etc.  Advises that appears to have a significant chronic component.  At this point advises that it would be reasonable for the patient to be discharged home to be admitted based on patient's preference.  Discussed with the patient and NP Cherylene also spoke with the patient via phone directly in the ER and came to the decision that the patient would like to discharge.  There is no clear indication of acute infection or chest syndrome etc.  Reassuring laboratory testing.  Leukocytosis is present but also was similar on prior admission.  Patient advises PCP Dr. Morgan manages her outpatient pain medication.  Patient agreeable with plan to discharge, discussed careful return precautions.  Patient not driving.  Appears to have good pain control at this time objectively  Return precautions and treatment recommendations and follow-up discussed with the patient who is agreeable with the plan.    Dicky Anes, MD 12/26/23 1050

## 2023-12-26 NOTE — ED Provider Notes (Signed)
-----------------------------------------   10:14 AM on 12/26/2023 ----------------------------------------- Patient reports moderate ongoing sickle cell type pain.  Reports she does feel like pain remains out of control.  She does not appear in acute distress or extremis, currently utilizing her cell phone however she does report significant ongoing pain.  She has had 3 doses of hydromorphone  at this time.  I have at this juncture given the reported intractable nature of her pain discussed with the patient, she feels that she would need to be admitted for pain control at this time.  She is agreeable and understanding with the plan to attempt admission at [facility] where our Sickle specialty care is available.  UA result is PENDING   Dicky Anes, MD 12/26/23 1016

## 2023-12-26 NOTE — ED Provider Notes (Signed)
 [facility] Provider Note    Event Date/Time   First MD Initiated Contact with Patient 12/26/23 [phone]     (approximate)   History   Sickle Cell Pain Crisis   HPI  Bailey Reese is a 18 y.o. female   Past medical history of sickle cell disease, PTSD, anxiety and depression who presents to the emergency department with sickle cell pain crisis, typical of her crises in the past with whole body pain.  She does note mild cough as well.  She denies shortness of breath.  She denies fever.  She had very similar symptoms when she presented a couple of weeks ago for acute pain crisis which was refractory to ED treatment improved after brief hospitalization over at [facility] and was discharged.  She is in the process of moving her sickle cell care from [facility] to [facility]  She denies GI or GU symptoms.  No acute medical complaints.  External Medical Documents Reviewed: Hospital notes from recent admission for sickle cell pain crisis      Physical Exam   Triage Vital Signs: ED Triage Vitals [12/26/23 0218]  Encounter Vitals Group     BP 128/76     Girls Systolic BP Percentile      Girls Diastolic BP Percentile      Boys Systolic BP Percentile      Boys Diastolic BP Percentile      Pulse Rate 92     Resp 18     Temp 98.3 F (36.8 C)     Temp Source Oral     SpO2 95 %     Weight 145 lb (65.8 kg)     Height 5' 4 (1.626 m)     Head Circumference      Peak Flow      Pain Score 9     Pain Loc      Pain Education      Exclude from Growth Chart     Most recent vital signs: Vitals:   12/26/23 0730 12/26/23 0734  BP: 108/65   Pulse: 70   Resp: 15   Temp:  98 F (36.7 C)  SpO2: 99%     General: Awake, no distress.  CV:  Good peripheral perfusion.  Resp:  Normal effort.  Abd:  No distention.  Other:  Awake alert comfortable appearing patient in no acute distress.  Clear lungs without focality or wheezing.  Soft nontender abdomen.  Neck supple  full range of motion.  Able to range extremities.   ED Results / Procedures / Treatments   Labs (all labs ordered are listed, but only abnormal results are displayed) Labs Reviewed  CBC WITH DIFFERENTIAL/PLATELET - Abnormal; Notable for the following components:      Result Value   WBC 14.6 (*)    RBC 2.22 (*)    Hemoglobin 7.2 (*)    HCT 20.9 (*)    RDW 18.8 (*)    nRBC 6.9 (*)    Lymphs Abs 5.2 (*)    Monocytes Absolute 1.7 (*)    Abs Immature Granulocytes 0.13 (*)    Abs Granulocyte 6.9 (*)    All other components within normal limits  COMPREHENSIVE METABOLIC PANEL WITH GFR - Abnormal; Notable for the following components:   CO2 21 (*)    AST 45 (*)    Total Bilirubin 3.9 (*)    All other components within normal limits  RETICULOCYTES - Abnormal; Notable for the following components:  Retic Ct Pct 17.1 (*)    RBC. 2.05 (*)    Retic Count, Absolute 374.9 (*)    Immature Retic Fract 33.8 (*)    All other components within normal limits  LIPASE, BLOOD  HCG, QUANTITATIVE, PREGNANCY  URINALYSIS, ROUTINE W REFLEX MICROSCOPIC  POC URINE PREG, ED  TROPONIN I (HIGH SENSITIVITY)  TROPONIN I (HIGH SENSITIVITY)     I ordered and reviewed the above labs they are notable for leukocytosis 14.6 with similar leukocytosis during recent admission.  Hemoglobin today is 7.2 with a baseline in the sevens to eights.  EKG  ED ECG REPORT I, Ginnie Shams, the attending physician, personally viewed and interpreted this ECG.   Date: 12/26/2023  EKG Time: 0635  Rate: 72  Rhythm: sinus  Axis: nl  Intervals:nl  ST&T Change: no stemi    RADIOLOGY I independently reviewed and interpreted chest x-ray and I see no obvious focality pneumothorax I also reviewed radiologist's formal read.   PROCEDURES:  Critical Care performed: No  Procedures   MEDICATIONS ORDERED IN ED: Medications  HYDROmorphone  (DILAUDID ) injection 1.5 mg (1.5 mg Intravenous Given 12/26/23 0734)  sodium chloride   0.9 % bolus 1,000 mL (0 mLs Intravenous Stopped 12/26/23 0634)  acetaminophen  (TYLENOL ) tablet 1,000 mg (1,000 mg Oral Given 12/26/23 0457)  diphenhydrAMINE  (BENADRYL ) injection 25 mg (25 mg Intravenous Given 12/26/23 0527)  ondansetron  (ZOFRAN ) injection 4 mg (4 mg Intravenous Given 12/26/23 0526)  ketorolac  (TORADOL ) 30 MG/ML injection 30 mg (30 mg Intravenous Given 12/26/23 0531)     IMPRESSION / MDM / ASSESSMENT AND PLAN / ED COURSE  I reviewed the triage vital signs and the nursing notes.                                Patient's presentation is most consistent with acute presentation with potential threat to life or bodily function.  Differential diagnosis includes, but is not limited to, sickle cell pain crisis, vaso-occlusive crisis, acute chest, infection sustained    MDM:    Pain similar to her pain crises in the past and recent admission pain crisis a couple of weeks ago, associated with a mild cough.  Check for acute chest, chest x-ray, leukocytosis appears to be stable and consistent with her recent hospitalization for pain crisis as well.  Hemoglobin is 7.2 which is slightly lower than recently but review of history  shows that she is in the 7 states typically with an adequate reticulocyte response today.  Will trial pain medications typical for her pain crises and reassess pain, ensure no focalities on chest x-ray to suggest acute chest syndrome.  Disposition pending her imaging and response to pain treatment.       FINAL CLINICAL IMPRESSION(S) / ED DIAGNOSES   Final diagnoses:  Sickle cell pain crisis (HCC)     Rx / DC Orders   ED Discharge Orders     None        Note:  This document was prepared using Dragon voice recognition software and may include unintentional dictation errors.    Shams Ginnie, MD 12/26/23 [phone]

## 2023-12-26 NOTE — Discharge Instructions (Signed)
 No driving today.  Please call Dr. Suan office today for follow-up.  Return right away if you have difficulty breathing, chest pain, develop a fever, nausea and vomiting with dehydration concerns or other symptoms arise.

## 2023-12-26 NOTE — ED Triage Notes (Signed)
 Pt to ED via EMS from home, pt reports pain all over body that is throbbing, pt has hx sickle cell. Pt reports she was seen here for same 2 weeks ago.

## 2023-12-26 NOTE — ED Notes (Signed)
 Pt ambulated to the toilet without staff assistance. Returned to bed. Bed low/locked. Call bell within reach.

## 2023-12-31 ENCOUNTER — Emergency Department

## 2023-12-31 ENCOUNTER — Emergency Department
Admission: EM | Admit: 2023-12-31 | Discharge: 2024-01-01 | Disposition: A | Discharge status: Other Acute Inpatient Hospital

## 2023-12-31 DIAGNOSIS — R079 Chest pain, unspecified: Secondary | ICD-10-CM | POA: Insufficient documentation

## 2023-12-31 DIAGNOSIS — D5700 Hb-SS disease with crisis, unspecified: Secondary | ICD-10-CM | POA: Diagnosis present

## 2023-12-31 DIAGNOSIS — R569 Unspecified convulsions: Secondary | ICD-10-CM

## 2023-12-31 DIAGNOSIS — R0602 Shortness of breath: Secondary | ICD-10-CM | POA: Diagnosis not present

## 2023-12-31 DIAGNOSIS — R258 Other abnormal involuntary movements: Secondary | ICD-10-CM | POA: Diagnosis not present

## 2023-12-31 LAB — CBC WITH DIFFERENTIAL/PLATELET
Abs Immature Granulocytes: 0.19 K/uL — ABNORMAL HIGH (ref 0.00–0.07)
Basophils Absolute: 0.1 K/uL (ref 0.0–0.1)
Basophils Relative: 1 %
Eosinophils Absolute: 0.1 K/uL (ref 0.0–0.5)
Eosinophils Relative: 1 %
HCT: 23.9 % — ABNORMAL LOW (ref 36.0–46.0)
Hemoglobin: 8.3 g/dL — ABNORMAL LOW (ref 12.0–15.0)
Immature Granulocytes: 1 %
Lymphocytes Relative: 11 %
Lymphs Abs: 2.3 K/uL (ref 0.7–4.0)
MCH: 32.5 pg (ref 26.0–34.0)
MCHC: 34.7 g/dL (ref 30.0–36.0)
MCV: 93.7 fL (ref 80.0–100.0)
Monocytes Absolute: 2.9 K/uL — ABNORMAL HIGH (ref 0.1–1.0)
Monocytes Relative: 14 %
Neutro Abs: 14.7 K/uL — ABNORMAL HIGH (ref 1.7–7.7)
Neutrophils Relative %: 72 %
Platelets: 364 K/uL (ref 150–400)
RBC: 2.55 MIL/uL — ABNORMAL LOW (ref 3.87–5.11)
RDW: 18.2 % — ABNORMAL HIGH (ref 11.5–15.5)
WBC: 20.4 K/uL — ABNORMAL HIGH (ref 4.0–10.5)
nRBC: 2.1 % — ABNORMAL HIGH (ref 0.0–0.2)

## 2023-12-31 LAB — COMPREHENSIVE METABOLIC PANEL WITH GFR
ALT: 32 U/L (ref 0–44)
AST: 52 U/L — ABNORMAL HIGH (ref 15–41)
Albumin: 4.7 g/dL (ref 3.5–5.0)
Alkaline Phosphatase: 106 U/L (ref 38–126)
Anion gap: 17 — ABNORMAL HIGH (ref 5–15)
BUN: 11 mg/dL (ref 6–20)
CO2: 19 mmol/L — ABNORMAL LOW (ref 22–32)
Calcium: 9.7 mg/dL (ref 8.9–10.3)
Chloride: 101 mmol/L (ref 98–111)
Creatinine, Ser: 0.65 mg/dL (ref 0.44–1.00)
GFR, Estimated: >60 mL/min (ref >60)
Glucose, Bld: 80 mg/dL (ref 70–99)
Potassium: 4.3 mmol/L (ref 3.5–5.1)
Sodium: 137 mmol/L (ref 135–145)
Total Bilirubin: 5.9 mg/dL — ABNORMAL HIGH (ref 0.0–1.2)
Total Protein: 8.9 g/dL — ABNORMAL HIGH (ref 6.5–8.1)

## 2023-12-31 LAB — RETICULOCYTES
Immature Retic Fract: 26.9 % — ABNORMAL HIGH (ref 2.3–15.9)
RBC.: 2.56 MIL/uL — ABNORMAL LOW (ref 3.87–5.11)
Retic Count, Absolute: 83.6 K/uL (ref 19.0–186.0)
Retic Ct Pct: 17.8 % — ABNORMAL HIGH (ref 0.4–3.1)

## 2023-12-31 LAB — HCG, QUANTITATIVE, PREGNANCY: hCG, Beta Chain, Quant, S: <1 m[IU]/mL (ref <5)

## 2023-12-31 MED ORDER — HYDROMORPHONE HCL 1 MG/ML IJ SOLN
1.0000 mg | Freq: Once | INTRAMUSCULAR | Status: AC
  Administered 2023-12-31: 1 mg via INTRAVENOUS
  Filled 2023-12-31: qty 1

## 2023-12-31 MED ORDER — LORAZEPAM 2 MG/ML IJ SOLN
0.5000 mg | Freq: Once | INTRAMUSCULAR | Status: AC
  Administered 2023-12-31: 0.5 mg via INTRAVENOUS
  Filled 2023-12-31: qty 1

## 2023-12-31 MED ORDER — KETOROLAC TROMETHAMINE 15 MG/ML IJ SOLN
15.0000 mg | Freq: Once | INTRAMUSCULAR | Status: AC
  Administered 2023-12-31: 15 mg via INTRAVENOUS
  Filled 2023-12-31: qty 1

## 2023-12-31 MED ORDER — SODIUM CHLORIDE 0.9 % IV BOLUS
1000.0000 mL | Freq: Once | INTRAVENOUS | Status: AC
  Administered 2023-12-31: 1000 mL via INTRAVENOUS

## 2023-12-31 MED ORDER — ONDANSETRON HCL 4 MG/2ML IJ SOLN
4.0000 mg | Freq: Once | INTRAMUSCULAR | Status: AC
  Administered 2023-12-31: 4 mg via INTRAVENOUS
  Filled 2023-12-31: qty 2

## 2023-12-31 NOTE — ED Triage Notes (Signed)
 Pt BIB EMS for seizure. Pt was at church and not acting normal. Pt was described to be having a seizure by mother. The patient has a history of seizures, Sickle cell, and aneurysms causing strokes per EMS. Patient crying and unable to console at triage.

## 2023-12-31 NOTE — ED Notes (Signed)
 Patient back from radiology.

## 2023-12-31 NOTE — ED Provider Notes (Signed)
 [facility] Provider Note    Event Date/Time   First MD Initiated Contact with Patient 12/31/23 2103     (approximate)   History   Seizures   HPI  Bailey Reese is a 18 y.o. female  ckle cell disease, PTSD, depression She has had previous osteomyelitis and multiple episodes of acute chest syndrome. Also hemorrhagic stroke in 2017.  Patient has had chest pain and pain in her extremities consistent with prior sickle crisis all day.  She did go to church today and while at church developed a worsening headache.  Per reports she subsequently had seizure-like activity for reported 6 to 8 minutes.  Mother does state that she was moaning through this but was similar to presentation for when she had her prior hemorrhagic stroke.  It is unclear whether the patient has had a previous history of seizures as she states that she had seizure-like activity with her prior stroke and also when she required droperidol  1 time but she is not on any antiepileptic medications.  She continues to endorse chest pain, shortness of breath and bodyaches.  Permission to treat granted by mother who is at bedside      Physical Exam   Triage Vital Signs: ED Triage Vitals [12/31/23 2104]  Encounter Vitals Group     BP      Girls Systolic BP Percentile      Girls Diastolic BP Percentile      Boys Systolic BP Percentile      Boys Diastolic BP Percentile      Pulse      Resp      Temp      Temp src      SpO2 100 %     Weight      Height      Head Circumference      Peak Flow      Pain Score      Pain Loc      Pain Education      Exclude from Growth Chart     Most recent vital signs: Vitals:   12/31/23 2145 12/31/23 2239  BP: 121/77 124/64  Pulse: (!) 113 (!) 103  Resp: 19 14  Temp:    SpO2: 100% 99%    Nursing Triage Note reviewed. Vital signs reviewed and patients oxygen saturation is normoxic General: Patient is well nourished, well developed, awake and alert,  crying out in pain, tearful  Head: Normocephalic and atraumatic Eyes: Normal inspection, extraocular muscles intact, no conjunctival pallor Ear, nose, throat: Normal external exam Neck: Normal range of motion Respiratory: Patient is in no respiratory distress, lungs CTAB Cardiovascular: Patient is not tachycardic, RRR without murmur appreciated GI: Abd SNT with no guarding or rebound  Back: Normal inspection of the back with good strength and range of motion throughout all ext Extremities: pulses intact with good cap refills, no LE pitting edema or calf tenderness Neuro: The patient is alert and oriented to person, place, and time, appropriately conversive, with 5/5 bilat UE/LE strength, no gross motor or sensory defects noted. Coordination appears to be adequate. Skin: Warm, dry, and intact Psych: normal mood and affect, no SI or HI  ED Results / Procedures / Treatments   Labs (all labs ordered are listed, but only abnormal results are displayed) Labs Reviewed  CBC WITH DIFFERENTIAL/PLATELET - Abnormal; Notable for the following components:      Result Value   WBC 20.4 (*)    RBC 2.55 (*)  Hemoglobin 8.3 (*)    HCT 23.9 (*)    RDW 18.2 (*)    nRBC 2.1 (*)    Neutro Abs 14.7 (*)    Monocytes Absolute 2.9 (*)    Abs Immature Granulocytes 0.19 (*)    All other components within normal limits  COMPREHENSIVE METABOLIC PANEL WITH GFR - Abnormal; Notable for the following components:   CO2 19 (*)    Total Protein 8.9 (*)    AST 52 (*)    Total Bilirubin 5.9 (*)    Anion gap 17 (*)    All other components within normal limits  RETICULOCYTES - Abnormal; Notable for the following components:   Retic Ct Pct 17.8 (*)    RBC. 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  Apparently this was the finding also on the my MRI earlier this year  X-ray of chest: No acute abnormality on my independent review interpretation radiologist agrees   PROCEDURES:  Critical Care performed: No  Procedures   MEDICATIONS ORDERED IN ED: Medications  LORazepam  (ATIVAN ) injection 0.5 mg (0.5 mg Intravenous Given 12/31/23 2120)  sodium chloride  0.9 % bolus 1,000 mL (0 mLs Intravenous Stopped 12/31/23 2352)  sodium chloride  0.9 % bolus 1,000 mL (0 mLs Intravenous Stopped 12/31/23 2352)  HYDROmorphone  (DILAUDID ) injection 1 mg (1 mg Intravenous Given 12/31/23 2125)  ketorolac  (TORADOL ) 15 MG/ML injection 15 mg (15 mg Intravenous Given 12/31/23 2124)  HYDROmorphone  (DILAUDID ) injection 1 mg (1 mg Intravenous Given 12/31/23 2352)  ondansetron  (ZOFRAN ) injection 4 mg (4 mg Intravenous Given 12/31/23 2352)     IMPRESSION / MDM / ASSESSMENT AND PLAN / ED COURSE                                Differential diagnosis includes, but is not limited to: Intracranial hemorrhage, seizure, electrolyte derangement acute chest syndrome, sickle cell pain crisis  ED course: Patient arrives acutely crying out in pain but has no focal neurological deficits.  In order to facilitate assessment and also for seizure prophylaxis 0.5 mg of Ativan  was given and blood work was able to be obtained.  Hematocrit at baseline however patient does have a profound leukocytosis.  She has no profound electrolyte derangements and she has appropriate reticulocyte increase.  CT head demonstrated no intracranial hemorrhage and chest x-ray was not consistent with acute chest.  Despite  several rounds of Dilaudid  Toradol  and 2 L of IV fluid patient continues to endorse pain and given her leukocytosis I do think transfer is indicated.  We have contacted [facility] which she has gone to previously however have not discussed her case with an accepting physician.  Case signed out to oncoming physician pending transfer   Clinical Course as of 01/01/24 0001  Sun Dec 31, 2023  2315 Patient is still hurting. Has a CT scan which demonstrated no  intracranial hemorrhage but Chiari I malformation.  I have ordered more Dilaudid  for the patient but will pursue transfer at this time to which patient and mother were agreeable to [facility] [HD]    Clinical Course User Index [HD] Nicholaus Rolland BRAVO, MD     FINAL CLINICAL IMPRESSION(S) / ED DIAGNOSES   Final diagnoses:  Sickle cell pain crisis (HCC)  Seizure-like activity (HCC)     Rx / DC Orders   ED Discharge Orders     None        Note:  This document was prepared using Dragon voice recognition software and may include unintentional dictation errors.   Nicholaus Rolland BRAVO, MD 01/01/24 0001

## 2023-12-31 NOTE — Progress Notes (Signed)
 Plan of Care Note for accepted transfer   Patient: Bailey Reese MRN: [id number]   DOA: 12/31/2023  Facility requesting transfer: [facility]   Requesting Provider: Dr. Cyrena   Reason for transfer: Sickle cell pain crisis   Facility course: 18 yr old female with PTSD, depression, anxiety, chronic pain, hx of SAH in 2017, sickle cell disease, and reported history of seizures who presents after a seizure-like episode.   She is back to her neurologic baseline but was complaining of generalized pain similar to prior pain crises.   She is afebrile. WBC is 20,200 and Hgb appears close to baseline at 8.3. There are no acute findings on CXR or head CT.   She was given Toradol , Dilaudid , IVF, and Zofran  in the ED.   Plan of care: The patient is accepted for admission to Med-surg  unit, at [facility].   Author: Evalene GORMAN Sprinkles, MD 12/31/2023  Check [email or web address] for on-call coverage.  Nursing staff, Please call [facility] Admits & Consults System-Wide number on Amion as soon as patient's arrival, so appropriate admitting provider can evaluate the pt.

## 2024-01-01 ENCOUNTER — Other Ambulatory Visit: Payer: Self-pay

## 2024-01-01 ENCOUNTER — Inpatient Hospital Stay (HOSPITAL_COMMUNITY)
Admission: EM | Admit: 2024-01-01 | Discharge: 2024-01-03 | DRG: 812 | Disposition: A | Discharge status: Home / Self Care | Source: Other Acute Inpatient Hospital | Attending: Internal Medicine | Admitting: Internal Medicine

## 2024-01-01 ENCOUNTER — Encounter (HOSPITAL_COMMUNITY): Payer: Self-pay | Admitting: Family Medicine

## 2024-01-01 ENCOUNTER — Encounter (HOSPITAL_COMMUNITY): Payer: Self-pay

## 2024-01-01 ENCOUNTER — Inpatient Hospital Stay (HOSPITAL_COMMUNITY)
Admission: EM | Admit: 2024-01-01 | Discharge: 2024-01-01 | Disposition: A | Discharge status: Home / Self Care | Source: Other Acute Inpatient Hospital | Attending: Internal Medicine | Admitting: Internal Medicine

## 2024-01-01 DIAGNOSIS — D72829 Elevated white blood cell count, unspecified: Secondary | ICD-10-CM | POA: Diagnosis present

## 2024-01-01 DIAGNOSIS — F4310 Post-traumatic stress disorder, unspecified: Secondary | ICD-10-CM | POA: Diagnosis present

## 2024-01-01 DIAGNOSIS — J45909 Unspecified asthma, uncomplicated: Secondary | ICD-10-CM | POA: Diagnosis present

## 2024-01-01 DIAGNOSIS — F32A Depression, unspecified: Secondary | ICD-10-CM | POA: Diagnosis present

## 2024-01-01 DIAGNOSIS — Z79899 Other long term (current) drug therapy: Secondary | ICD-10-CM

## 2024-01-01 DIAGNOSIS — Z886 Allergy status to analgesic agent status: Secondary | ICD-10-CM

## 2024-01-01 DIAGNOSIS — Z91010 Allergy to peanuts: Secondary | ICD-10-CM

## 2024-01-01 DIAGNOSIS — Z7951 Long term (current) use of inhaled steroids: Secondary | ICD-10-CM

## 2024-01-01 DIAGNOSIS — R079 Chest pain, unspecified: Secondary | ICD-10-CM | POA: Diagnosis not present

## 2024-01-01 DIAGNOSIS — R569 Unspecified convulsions: Secondary | ICD-10-CM | POA: Diagnosis present

## 2024-01-01 DIAGNOSIS — R52 Pain, unspecified: Secondary | ICD-10-CM | POA: Diagnosis present

## 2024-01-01 DIAGNOSIS — Z9049 Acquired absence of other specified parts of digestive tract: Secondary | ICD-10-CM | POA: Diagnosis not present

## 2024-01-01 DIAGNOSIS — D5700 Hb-SS disease with crisis, unspecified: Principal | ICD-10-CM | POA: Diagnosis present

## 2024-01-01 DIAGNOSIS — D638 Anemia in other chronic diseases classified elsewhere: Secondary | ICD-10-CM | POA: Diagnosis present

## 2024-01-01 DIAGNOSIS — F419 Anxiety disorder, unspecified: Secondary | ICD-10-CM | POA: Diagnosis present

## 2024-01-01 DIAGNOSIS — Z91018 Allergy to other foods: Secondary | ICD-10-CM | POA: Diagnosis not present

## 2024-01-01 DIAGNOSIS — E872 Acidosis, unspecified: Secondary | ICD-10-CM | POA: Diagnosis present

## 2024-01-01 DIAGNOSIS — Z888 Allergy status to other drugs, medicaments and biological substances status: Secondary | ICD-10-CM | POA: Diagnosis not present

## 2024-01-01 DIAGNOSIS — G894 Chronic pain syndrome: Secondary | ICD-10-CM | POA: Diagnosis present

## 2024-01-01 DIAGNOSIS — Z8673 Personal history of transient ischemic attack (TIA), and cerebral infarction without residual deficits: Secondary | ICD-10-CM

## 2024-01-01 DIAGNOSIS — Z79890 Hormone replacement therapy (postmenopausal): Secondary | ICD-10-CM

## 2024-01-01 DIAGNOSIS — Z79891 Long term (current) use of opiate analgesic: Secondary | ICD-10-CM

## 2024-01-01 DIAGNOSIS — K219 Gastro-esophageal reflux disease without esophagitis: Secondary | ICD-10-CM | POA: Diagnosis present

## 2024-01-01 DIAGNOSIS — R0602 Shortness of breath: Secondary | ICD-10-CM | POA: Diagnosis not present

## 2024-01-01 DIAGNOSIS — R258 Other abnormal involuntary movements: Secondary | ICD-10-CM | POA: Diagnosis not present

## 2024-01-01 LAB — CBC
HCT: 22.2 % — ABNORMAL LOW (ref 36.0–46.0)
Hemoglobin: 7.3 g/dL — ABNORMAL LOW (ref 12.0–15.0)
MCH: 32 pg (ref 26.0–34.0)
MCHC: 32.9 g/dL (ref 30.0–36.0)
MCV: 97.4 fL (ref 80.0–100.0)
Platelets: 372 K/uL (ref 150–400)
RBC: 2.28 MIL/uL — ABNORMAL LOW (ref 3.87–5.11)
RDW: 18.7 % — ABNORMAL HIGH (ref 11.5–15.5)
WBC: 15 K/uL — ABNORMAL HIGH (ref 4.0–10.5)
nRBC: 2.9 % — ABNORMAL HIGH (ref 0.0–0.2)

## 2024-01-01 LAB — COMPREHENSIVE METABOLIC PANEL WITH GFR
ALT: 25 U/L (ref 0–44)
AST: 29 U/L (ref 15–41)
Albumin: 3.6 g/dL (ref 3.5–5.0)
Alkaline Phosphatase: 93 U/L (ref 38–126)
Anion gap: 7 (ref 5–15)
BUN: 9 mg/dL (ref 6–20)
CO2: 21 mmol/L — ABNORMAL LOW (ref 22–32)
Calcium: 8.7 mg/dL — ABNORMAL LOW (ref 8.9–10.3)
Chloride: 108 mmol/L (ref 98–111)
Creatinine, Ser: 0.45 mg/dL (ref 0.44–1.00)
GFR, Estimated: >60 mL/min (ref >60)
Glucose, Bld: 89 mg/dL (ref 70–99)
Potassium: 3.8 mmol/L (ref 3.5–5.1)
Sodium: 136 mmol/L (ref 135–145)
Total Bilirubin: 4.8 mg/dL — ABNORMAL HIGH (ref 0.0–1.2)
Total Protein: 7.1 g/dL (ref 6.5–8.1)

## 2024-01-01 LAB — TYPE AND SCREEN
ABO/RH(D): B POS
Antibody Screen: NEGATIVE

## 2024-01-01 MED ORDER — SODIUM CHLORIDE 0.9% FLUSH: 9.0000 mL | INTRAVENOUS | Status: DC | PRN

## 2024-01-01 MED ORDER — DIPHENHYDRAMINE HCL 25 MG PO CAPS: 25.0000 mg | ORAL_CAPSULE | Freq: Four times a day (QID) | ORAL | Status: DC | PRN

## 2024-01-01 MED ORDER — POLYETHYLENE GLYCOL 3350 17 G PO PACK: 17.0000 g | PACK | Freq: Every day | ORAL | Status: DC | PRN

## 2024-01-01 MED ORDER — SENNOSIDES-DOCUSATE SODIUM 8.6-50 MG PO TABS
1.0000 | ORAL_TABLET | Freq: Two times a day (BID) | ORAL | Status: DC
  Administered 2024-01-01 – 2024-01-03 (×5): 1 via ORAL
  Filled 2024-01-01 (×5): qty 1

## 2024-01-01 MED ORDER — DIPHENHYDRAMINE HCL 12.5 MG/5ML PO ELIX: 12.5000 mg | ORAL_SOLUTION | Freq: Four times a day (QID) | ORAL | Status: DC | PRN

## 2024-01-01 MED ORDER — ENOXAPARIN SODIUM 40 MG/0.4ML IJ SOSY
40.0000 mg | PREFILLED_SYRINGE | INTRAMUSCULAR | Status: DC
  Administered 2024-01-01 – 2024-01-03 (×3): 40 mg via SUBCUTANEOUS
  Filled 2024-01-01 (×3): qty 0.4

## 2024-01-01 MED ORDER — DIPHENHYDRAMINE HCL 25 MG PO CAPS
25.0000 mg | ORAL_CAPSULE | Freq: Once | ORAL | Status: AC
  Administered 2024-01-01: 25 mg via ORAL
  Filled 2024-01-01: qty 1

## 2024-01-01 MED ORDER — HYDROMORPHONE HCL 1 MG/ML IJ SOLN
1.0000 mg | Freq: Once | INTRAMUSCULAR | Status: AC
  Administered 2024-01-01: 1 mg via INTRAVENOUS
  Filled 2024-01-01: qty 1

## 2024-01-01 MED ORDER — ALBUTEROL SULFATE (2.5 MG/3ML) 0.083% IN NEBU: 2.5000 mg | INHALATION_SOLUTION | Freq: Four times a day (QID) | RESPIRATORY_TRACT | Status: DC | PRN

## 2024-01-01 MED ORDER — ALBUTEROL SULFATE HFA 108 (90 BASE) MCG/ACT IN AERS: 1.0000 | INHALATION_SPRAY | Freq: Four times a day (QID) | RESPIRATORY_TRACT | Status: DC | PRN

## 2024-01-01 MED ORDER — FLUTICASONE FUROATE-VILANTEROL 100-25 MCG/ACT IN AEPB
1.0000 | INHALATION_SPRAY | Freq: Every day | RESPIRATORY_TRACT | Status: DC
  Administered 2024-01-01 – 2024-01-03 (×3): 1 via RESPIRATORY_TRACT
  Filled 2024-01-01: qty 28

## 2024-01-01 MED ORDER — HYDROMORPHONE 1 MG/ML IV SOLN
INTRAVENOUS | Status: DC
  Administered 2024-01-01: 3.3 mg via INTRAVENOUS
  Administered 2024-01-01: 3.2 mg via INTRAVENOUS
  Administered 2024-01-01: 3.3 mg via INTRAVENOUS
  Administered 2024-01-01: 30 mg via INTRAVENOUS
  Administered 2024-01-01: 3.3 mg via INTRAVENOUS
  Administered 2024-01-02: 3.9 mg via INTRAVENOUS
  Administered 2024-01-02: 4.5 mg via INTRAVENOUS
  Administered 2024-01-02: 1.5 mg via INTRAVENOUS
  Administered 2024-01-02 (×3): 3.9 mg via INTRAVENOUS
  Administered 2024-01-02: 2.7 mg via INTRAVENOUS
  Administered 2024-01-02: 30 mg via INTRAVENOUS
  Administered 2024-01-03: 4.2 mg via INTRAVENOUS
  Administered 2024-01-03: 3.3 mg via INTRAVENOUS
  Administered 2024-01-03: 2.7 mg via INTRAVENOUS
  Filled 2024-01-01 (×2): qty 30

## 2024-01-01 MED ORDER — NALOXONE HCL 0.4 MG/ML IJ SOLN: 0.4000 mg | INTRAMUSCULAR | Status: DC | PRN

## 2024-01-01 MED ORDER — SODIUM CHLORIDE 0.45 % IV SOLN: INTRAVENOUS | Status: AC

## 2024-01-01 MED ORDER — DIPHENHYDRAMINE HCL 50 MG/ML IJ SOLN: 12.5000 mg | Freq: Four times a day (QID) | INTRAMUSCULAR | Status: DC | PRN

## 2024-01-01 MED ORDER — ONDANSETRON HCL 4 MG/2ML IJ SOLN
4.0000 mg | Freq: Four times a day (QID) | INTRAMUSCULAR | Status: DC | PRN
  Administered 2024-01-01 – 2024-01-03 (×5): 4 mg via INTRAVENOUS
  Filled 2024-01-01 (×5): qty 2

## 2024-01-01 MED ORDER — KETOROLAC TROMETHAMINE 15 MG/ML IJ SOLN
15.0000 mg | Freq: Four times a day (QID) | INTRAMUSCULAR | Status: DC
  Administered 2024-01-01 – 2024-01-03 (×9): 15 mg via INTRAVENOUS
  Filled 2024-01-01 (×9): qty 1

## 2024-01-01 NOTE — H&P (Addendum)
 History and Physical    Bailey Reese FMW:[id number] DOB: November 03, 2005 DOA: 01/01/2024  PCP: Morgan Slater Pizza, MD  Patient coming from: [facility] ED  Chief Complaint: Sickle cell pain  HPI: Bailey Reese is a 18 y.o. female with medical history significant of sickle cell disease, chronic pain, history of hemorrhagic stroke in 2017, asthma, PTSD, depression, anxiety, GERD presenting from [facility] ED due to concern for sickle cell pain crisis and possible seizure-like activity.   Patient states she had a seizure at church and bystanders called ambulance and she was brought to the emergency room.  She reports history of seizures in the past but never started on any medications.  She reports having severe generalized pain all over her body for the past few days.  States she used to be on oxycodone  for her sickle cell pain but her doctor stopped it a few months ago and since then she has been hospitalized multiple times for sickle cell pain.  Reports subjective fevers and chills.  Reports cough and shortness of breath.  Denies nausea, vomiting, diarrhea, or any urinary symptoms.  ED course: She was back to her neurologic baseline and was complaining of generalized pain similar to prior pain crisis.  Afebrile.  Labs notable for WBC count 20.4, hemoglobin close to baseline at 8.3, bicarb 19, anion gap 17, glucose 80, creatinine 0.65, total protein 8.9, AST and T. bili elevated on previous labs as well and no significant change, ALT and alk phos normal.  No acute findings on chest x-ray and head CT.  Patient was given Ativan , Zofran , IV fluids, and multiple rounds of pain meds including IV Toradol  and IV Dilaudid  in the ED.  Review of Systems:  Review of Systems  All other systems reviewed and are negative.   Past Medical History:  Diagnosis Date   Anxiety    Depression    PTSD (post-traumatic stress disorder)    Sickle cell anemia (HCC)     Past Surgical History:  Procedure Laterality Date    BIOPSY  07/04/2023   Procedure: BIOPSY;  Surgeon: Saintclair Jasper, MD;  Location: [facility];  Service: Gastroenterology;;   CHOLECYSTECTOMY     ESOPHAGOGASTRODUODENOSCOPY (EGD) WITH PROPOFOL  N/A 07/04/2023   Procedure: ESOPHAGOGASTRODUODENOSCOPY (EGD) WITH PROPOFOL ;  Surgeon: Saintclair Jasper, MD;  Location: [facility];  Service: Gastroenterology;  Laterality: N/A;   WRIST SURGERY       reports that she has never smoked. She has never used smokeless tobacco. She reports that she does not drink alcohol  and does not use drugs.  Allergies  Allergen Reactions   Cherry Anaphylaxis, Itching and Swelling   Olanzapine  Other (See Comments)    Drug-induced liver injury   Other Swelling and Other (See Comments)    Pitted fruits   Peanut-Containing Drug Products Anaphylaxis, Itching and Swelling   Plum Pulp Anaphylaxis and Hives   Droperidol  Other (See Comments)    Pt states muscle tension, involuntary movements, eye drift, anxiety   Morphine  And Codeine Hives and Itching   Peach [Prunus Persica] Itching, Swelling and Other (See Comments)   Acetaminophen  Nausea And Vomiting   Compazine  [Prochlorperazine ] Other (See Comments) and Hypertension    Patient stated that this medication causes her HR to increase and well as BP. She also states that it cause hallucinations.   Ibuprofen  Nausea And Vomiting    Family History  Problem Relation Age of Onset   Diabetes Paternal Aunt     Prior to Admission medications   Medication Sig  Start Date End Date Taking? Authorizing Provider  budesonide-formoterol  (SYMBICORT) 80-4.5 MCG/ACT inhaler Inhale 2 puffs into the lungs See admin instructions. Inhale 2 puffs by mouth twice daily and an extra puff as needed. 12/29/22 12/29/23  [provider]  buprenorphine  (BUTRANS ) 20 MCG/HR PTWK Place 1 patch onto the skin once a week.    [provider]  Calcium Carb-Cholecalciferol (CALCIUM 600+D3 PO) Take 1 tablet by mouth at bedtime.    [provider]  Deferasirox  360 MG TABS Take 360 mg by mouth 2 (two) times daily.    [provider]  ergocalciferol (VITAMIN D2) 1.25 MG (50000 UT) capsule Take 50,000 Units by mouth once a week.    [provider]  esomeprazole  (NEXIUM ) 40 MG capsule Take 1 capsule (40 mg total) by mouth daily as needed (reflux). 08/29/23   Ijaola, Onyeje M, NP  fexofenadine (ALLEGRA) 180 MG tablet Take 180 mg by mouth daily as needed for allergies. 12/29/22   [provider]  fluticasone  (FLONASE ) 50 MCG/ACT nasal spray Place 1 spray into both nostrils daily as needed for allergies or rhinitis. 12/29/22   [provider]  hydroxyurea  (HYDREA ) 500 MG capsule Take 500 mg by mouth 2 (two) times daily. May take with food to minimize GI side effects.    [provider]  hydrOXYzine  (ATARAX ) 25 MG tablet Take 25 mg by mouth See admin instructions. Take 25mg  (1 tablet) by mouth twice daily and an extra tablet if needed.    [provider]  lubiprostone  (AMITIZA ) 24 MCG capsule Take 24 mcg by mouth in the morning and at bedtime. 11/12/23   [provider]  ondansetron  (ZOFRAN -ODT) 4 MG disintegrating tablet Take 4 mg by mouth every 8 (eight) hours as needed for nausea or vomiting (dissolve orally).    [provider]  Oxycodone  HCl 10 MG TABS Take 1 tablet (10 mg total) by mouth every 6 (six) hours as needed (pain). 07/05/23   Sim Emery CROME, MD  pantoprazole  (PROTONIX ) 40 MG tablet Take 1 tablet (40 mg total) by mouth 2 (two) times daily. 07/05/23 07/04/24  Sim Emery CROME, MD  prazosin  (MINIPRESS ) 1 MG capsule Take 1 mg by mouth at bedtime.    [provider]  pregabalin  (LYRICA ) 150 MG capsule Take 150 mg by mouth 3 (three) times daily.    [provider]  prochlorperazine  (COMPAZINE ) 5 MG tablet Take 5 mg by mouth every 6 (six) hours as needed for nausea or vomiting.    [provider]  promethazine  (PHENERGAN ) 12.5 MG  tablet Take 12.5 mg by mouth every 6 (six) hours as needed for nausea or vomiting. 05/10/23   [provider]    Physical Exam: Vitals:   01/01/24 0250 01/01/24 0308  BP:  126/72  Pulse:  (!) 113  Resp:  16  Temp:  98.8 F (37.1 C)  TempSrc:  Oral  SpO2:  97%  Weight: 71.7 kg   Height: 5' 4 (1.626 m)     Physical Exam Vitals reviewed.  Constitutional:      General: She is not in acute distress. HENT:     Head: Normocephalic and atraumatic.  Cardiovascular:     Rate and Rhythm: Normal rate and regular rhythm.     Heart sounds: Normal heart sounds.  Pulmonary:     Effort: Pulmonary effort is normal. No respiratory distress.     Breath sounds: Normal breath sounds. No stridor. No wheezing, rhonchi or rales.  Abdominal:  General: Bowel sounds are normal. There is no distension.     Palpations: Abdomen is soft.     Tenderness: There is no abdominal tenderness. There is no guarding.  Musculoskeletal:     Cervical back: Normal range of motion.     Right lower leg: No edema.     Left lower leg: No edema.  Skin:    General: Skin is warm and dry.  Neurological:     General: No focal deficit present.     Mental Status: She is alert and oriented to person, place, and time.     Labs on Admission: I have personally reviewed following labs and imaging studies  CBC: Recent Labs  Lab 12/26/23 0221 12/31/23 2151  WBC 14.6* 20.4*  NEUTROABS 6.9 14.7*  HGB 7.2* 8.3*  HCT 20.9* 23.9*  MCV 94.1 93.7  PLT 367 364   Basic Metabolic Panel: Recent Labs  Lab 12/26/23 0221 12/31/23 2151  NA 139 137  K 3.5 4.3  CL 104 101  CO2 21* 19*  GLUCOSE 99 80  BUN 11 11  CREATININE 0.49 0.65  CALCIUM 9.3 9.7   GFR: Estimated Creatinine Clearance: 109.8 mL/min (by C-G formula based on SCr of 0.65 mg/dL). Liver Function Tests: Recent Labs  Lab 12/26/23 0221 12/31/23 2151  AST 45* 52*  ALT 33 32  ALKPHOS 108 106  BILITOT 3.9* 5.9*  PROT 7.5 8.9*  ALBUMIN 4.1  4.7   Recent Labs  Lab 12/26/23 0221  LIPASE 28   No results for input(s): AMMONIA in the last 168 hours. Coagulation Profile: No results for input(s): INR, PROTIME in the last 168 hours. Cardiac Enzymes: No results for input(s): CKTOTAL, CKMB, CKMBINDEX, TROPONINI in the last 168 hours. BNP (last 3 results) No results for input(s): PROBNP in the last 8760 hours. HbA1C: No results for input(s): HGBA1C in the last 72 hours. CBG: No results for input(s): GLUCAP in the last 168 hours. Lipid Profile: No results for input(s): CHOL, HDL, LDLCALC, TRIG, CHOLHDL, LDLDIRECT in the last 72 hours. Thyroid Function Tests: No results for input(s): TSH, T4TOTAL, FREET4, T3FREE, THYROIDAB in the last 72 hours. Anemia Panel: Recent Labs    12/31/23 2151  RETICCTPCT 17.8*   Urine analysis:    Component Value Date/Time   COLORURINE YELLOW (A) 12/26/2023 0939   APPEARANCEUR CLEAR (A) 12/26/2023 0939   APPEARANCEUR Clear 05/01/2013 1440   LABSPEC 1.011 12/26/2023 0939   LABSPEC 1.016 05/01/2013 1440   PHURINE 5.0 12/26/2023 0939   GLUCOSEU NEGATIVE 12/26/2023 0939   GLUCOSEU Negative 05/01/2013 1440   HGBUR NEGATIVE 12/26/2023 0939   BILIRUBINUR NEGATIVE 12/26/2023 0939   BILIRUBINUR Negative 05/01/2013 1440   KETONESUR NEGATIVE 12/26/2023 0939   PROTEINUR NEGATIVE 12/26/2023 0939   NITRITE NEGATIVE 12/26/2023 0939   LEUKOCYTESUR NEGATIVE 12/26/2023 0939   LEUKOCYTESUR Negative 05/01/2013 1440    Radiological Exams on Admission: DG Chest 2 View Result Date: 12/31/2023 EXAM: 2 VIEW(S) XRAY OF THE CHEST 12/31/2023 10:02:00 PM COMPARISON: None available. CLINICAL HISTORY: Evaluate for acute chest, sickle cell. Sickle cell. FINDINGS: LUNGS AND PLEURA: No focal pulmonary opacity. No pulmonary edema. No pleural effusion. No pneumothorax. HEART AND MEDIASTINUM: No acute abnormality of the cardiac and mediastinal silhouettes. BONES AND SOFT TISSUES: No  acute osseous abnormality. IMPRESSION: 1. No acute process. Electronically signed by: Franky Stanford MD 12/31/2023 10:12 PM EDT RP Workstation: [id number]   CT Head Wo Contrast Result Date: 12/31/2023 EXAM: CT HEAD WITHOUT CONTRAST 12/31/2023 10:07:18 PM TECHNIQUE: CT of  the head was performed without the administration of intravenous contrast. Automated exposure control, iterative reconstruction, and/or weight based adjustment of the mA/kV was utilized to reduce the radiation dose to as low as reasonably achievable. COMPARISON: 03/02/2017 CLINICAL HISTORY: Seizure, sickle cell, history of intracerebral hemorrhage (ICH). Patient brought in by EMS for seizure. Patient was at church and not acting normal. Patient was described to be having a seizure by mother. The patient has a history of seizures, sickle cell, and aneurysms causing strokes per EMS. Patient crying and unable to console at triage. FINDINGS: BRAIN AND VENTRICLES: No acute hemorrhage. Gray-white differentiation is preserved. No hydrocephalus. No extra-axial collection. No mass effect or midline shift. ORBITS: No acute abnormality. SINUSES: No acute abnormality. SOFT TISSUES AND SKULL: No acute soft tissue abnormality. No skull fracture. Cerebellar tonsils extend 12 mm below the foramen magnum consistent with Chiari malformation. IMPRESSION: 1. No acute intracranial abnormality. 2. 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 Electronically signed by: Franky Stanford MD 12/31/2023 10:11 PM EDT RP Workstation: [id number]    EKG: Independently reviewed.  Sinus tachycardia.  Assessment and Plan  Sickle cell pain crisis Patient is reporting severe generalized pain all over her body despite receiving multiple rounds of pain meds in the ED including IV Toradol  and IV Dilaudid .  Start 0.45% saline at 100 mL/hr, Dilaudid  PCA, and continue IV IV Toradol  15 mg every 6 hours.  Continuous pulse ox, supplemental oxygen  as needed.  Monitor vital signs closely and reevaluate pain scale regularly.  Seizure-like activity Patient reports having a seizure at church prior to ED arrival.  She is reporting history of seizures in the past but never started on any medications?  No recurrence of seizure-like activity in the ED.  No acute findings on head CT.  EEG ordered, seizure precautions.  Consult neurology in the morning.  Leukocytosis Possibly reactive.  Patient is afebrile.  No infectious signs or symptoms.  Trend WBC count.  Anemia of chronic disease Hemoglobin at baseline and no indication for blood transfusion at this time.  Monitor labs.  Mild high anion gap metabolic acidosis IV fluid hydration and monitor labs.  Asthma Stable, no signs of acute exacerbation.  Placed on Breo which is hospital formulary replacement for her home Symbicort.  Albuterol  as needed.  DVT prophylaxis: Lovenox  Code Status: Full Code (discussed with the patient) Family Communication: No family available at this time. Level of care: Telemetry bed Admission status: It is my clinical opinion that referral for OBSERVATION is reasonable and necessary in this patient based on the above information provided. The aforementioned taken together are felt to place the patient at high risk for further clinical deterioration. However, it is anticipated that the patient may be medically stable for discharge from the hospital within 24 to 48 hours.  Editha Ram MD [facility]  If 7PM-7AM, please contact night-coverage [email or web address]  01/01/2024, 3:58 AM

## 2024-01-01 NOTE — Progress Notes (Signed)
 EEG complete, results are pending. Could not get all electrodes under 10 ohms due to condition of pts scalp. O1 P3 Pz P4 C3 and Cz will not reach scalp despite multiple attempts.

## 2024-01-01 NOTE — Procedures (Signed)
 Patient Name: Bailey Reese  MRN: [id number]  Epilepsy Attending: Arlin MALVA Krebs  Referring Physician/Provider: Alfornia Madison, MD  Date: 01/01/2024 Duration: 27.29 mins  Patient history: 18 y.o. female with possible seizure-like activity. EEG to evaluate for seizure.  Level of alertness: Awake  AEDs during EEG study: None  Technical aspects: This EEG study was done with scalp electrodes positioned according to the 10-20 International system of electrode placement. Electrical activity was reviewed with band pass filter of 1-70Hz , sensitivity of 7 uV/mm, display speed of 20mm/sec with a 60Hz  notched filter applied as appropriate. EEG data were recorded continuously and digitally stored.  Video monitoring was available and reviewed as appropriate.  Description: Per EEG tech, could not get all electrodes under 10 ohms due to condition of pts scalp. O1 P3 Pz P4 C3 and Cz will not reach scalp despite multiple attempts. Hyperventilation and photic stimulation were not performed.     IMPRESSION: This study was not interpretable due to significant electrode artifact.   Jonessa Triplett O Shalita Notte

## 2024-01-01 NOTE — Progress Notes (Signed)
   01/01/24 2135  TOC Brief Assessment  Insurance and Status Reviewed  Patient has primary care physician Yes  Home environment has been reviewed home w/ parents  Prior level of function: independent  Prior/Current Home Services No current home services  Social Drivers of Health Review SDOH reviewed no interventions necessary  Readmission risk has been reviewed Yes  Transition of care needs no transition of care needs at this time

## 2024-01-01 NOTE — Progress Notes (Addendum)
 Bailey Reese is a 18 y.o. female with medical history significant of sickle cell disease, chronic pain, history of hemorrhagic stroke in 2017, asthma, PTSD, depression, anxiety, GERD presenting from [facility] ED due to concern for sickle cell pain crisis and possible seizure-like activity.   Patient states she had a seizure at church and bystanders called ambulance and she was brought to the emergency room.  She is alert and oriented this morning and endorses pain of 8/10. MRI of the brain ordered for new onset seizure. No new concerns, continue all medication /sickle cell protocol as prescribed.

## 2024-01-02 ENCOUNTER — Inpatient Hospital Stay (HOSPITAL_COMMUNITY)

## 2024-01-02 LAB — CBC
HCT: 21.9 % — ABNORMAL LOW (ref 36.0–46.0)
Hemoglobin: 7.3 g/dL — ABNORMAL LOW (ref 12.0–15.0)
MCH: 31.9 pg (ref 26.0–34.0)
MCHC: 33.3 g/dL (ref 30.0–36.0)
MCV: 95.6 fL (ref 80.0–100.0)
Platelets: 383 K/uL (ref 150–400)
RBC: 2.29 MIL/uL — ABNORMAL LOW (ref 3.87–5.11)
RDW: 18.7 % — ABNORMAL HIGH (ref 11.5–15.5)
WBC: 10.8 K/uL — ABNORMAL HIGH (ref 4.0–10.5)
nRBC: 4.2 % — ABNORMAL HIGH (ref 0.0–0.2)

## 2024-01-02 MED ORDER — GADOBUTROL 1 MMOL/ML IV SOLN
7.0000 mL | Freq: Once | INTRAVENOUS | Status: AC | PRN
  Administered 2024-01-02: 7 mL via INTRAVENOUS

## 2024-01-02 MED ORDER — HYDROXYZINE HCL 10 MG PO TABS
10.0000 mg | ORAL_TABLET | Freq: Once | ORAL | Status: AC | PRN
  Administered 2024-01-02: 10 mg via ORAL
  Filled 2024-01-02: qty 1

## 2024-01-02 MED ORDER — HYDROXYUREA 500 MG PO CAPS
1500.0000 mg | ORAL_CAPSULE | Freq: Every day | ORAL | Status: DC
  Administered 2024-01-02 – 2024-01-03 (×2): 1500 mg via ORAL
  Filled 2024-01-02 (×2): qty 3

## 2024-01-02 NOTE — Plan of Care (Signed)
°  Problem: Clinical Measurements: Goal: Will remain free from infection Outcome: Progressing   Problem: Activity: Goal: Risk for activity intolerance will decrease Outcome: Progressing   Problem: Nutrition: Goal: Adequate nutrition will be maintained Outcome: Progressing

## 2024-01-02 NOTE — Progress Notes (Addendum)
 Subjective: Bailey Reese  is a 18 y.o. female with medical history significant of sickle cell disease, PTSD, depression with anxiety who usually gets her care at [facility] but was seen at [facility] ER for generalize body aches, Nausea and vomiting. She is admitted to [facility] for sickle cell pain management and possible seizure activity.      Patient is alert and oriented and reports improved pain of 8/10 today.  With no new concerns. She denies cough, fever, headaches, diarrhea.  No urinary symptoms.  Objective:  Vital signs in last 24 hours:  Vitals:   01/02/24 0739 01/02/24 0818 01/02/24 1216 01/02/24 1234  BP:      Pulse:      Resp: 17  17 17   Temp:      TempSrc:      SpO2: 98% 97% 98%   Weight:      Height:        Intake/Output from previous day:   Intake/Output Summary (Last 24 hours) at 01/02/2024 1359 Last data filed at 01/02/2024 0125 Gross per 24 hour  Intake 2178.2 ml  Output --  Net 2178.2 ml    Physical Exam: General: Alert, awake, oriented x3, in no acute distress.  HEENT: [place]/AT PEERL, EOMI Neck: Trachea midline,  no masses, no thyromegal,y no JVD, no carotid bruit OROPHARYNX:  Moist, No exudate/ erythema/lesions.  Heart: Regular rate and rhythm, without murmurs, rubs, gallops, PMI non-displaced, no heaves or thrills on palpation.  Lungs: Clear to auscultation, no wheezing or rhonchi noted. No increased vocal fremitus resonant to percussion  Abdomen: Soft, nontender, nondistended, positive bowel sounds, no masses no hepatosplenomegaly noted..  Neuro: No focal neurological deficits noted cranial nerves II through XII grossly intact. DTRs 2+ bilaterally upper and lower extremities. Strength 5 out of 5 in bilateral upper and lower extremities. Musculoskeletal: Generalized body tenderness  psychiatric: Patient alert and oriented x3, good insight and cognition, good recent to remote recall. Lymph node survey: No cervical axillary or inguinal  lymphadenopathy noted.  Lab Results:  Basic Metabolic Panel:    Component Value Date/Time   NA 136 01/01/2024 0528   NA 135 05/01/2013 1440   K 3.8 01/01/2024 0528   K 4.3 05/01/2013 1440   CL 108 01/01/2024 0528   CL 102 05/01/2013 1440   CO2 21 (L) 01/01/2024 0528   CO2 27 (H) 05/01/2013 1440   BUN 9 01/01/2024 0528   BUN 13 05/01/2013 1440   CREATININE 0.45 01/01/2024 0528   CREATININE 0.40 (L) 05/01/2013 1440   GLUCOSE 89 01/01/2024 0528   GLUCOSE 108 (H) 05/01/2013 1440   CALCIUM 8.7 (L) 01/01/2024 0528   CALCIUM 9.8 05/01/2013 1440   CBC:    Component Value Date/Time   WBC 10.8 (H) 01/02/2024 0651   HGB 7.3 (L) 01/02/2024 0651   HGB 6.6 (L) 05/01/2013 1440   HCT 21.9 (L) 01/02/2024 0651   HCT 20.1 (L) 05/01/2013 1440   PLT 383 01/02/2024 0651   PLT 468 (H) 05/01/2013 1440   MCV 95.6 01/02/2024 0651   MCV 89 05/01/2013 1440   NEUTROABS 14.7 (H) 12/31/2023 2151   NEUTROABS 7.9 05/30/2012 1958   LYMPHSABS 2.3 12/31/2023 2151   LYMPHSABS 5.0 05/30/2012 1958   MONOABS 2.9 (H) 12/31/2023 2151   MONOABS 1.6 (H) 05/30/2012 1958   EOSABS 0.1 12/31/2023 2151   EOSABS 0.6 05/30/2012 1958   BASOSABS 0.1 12/31/2023 2151   BASOSABS 3 04/28/2013 1736   BASOSABS 0.2 (H) 05/30/2012 1958  No results found for this or any previous visit (from the past 240 hours).  Studies/Results: EEG adult Result Date: 01/01/2024 Shelton Arlin KIDD, MD     01/01/2024  8:44 PM Patient Name: Bailey Reese MRN: [id number] Epilepsy Attending: Arlin KIDD Shelton Referring Physician/Provider: Alfornia Madison, MD Date: 01/01/2024 Duration: 27.29 mins Patient history: 18 y.o. female with possible seizure-like activity. EEG to evaluate for seizure. Level of alertness: Awake AEDs during EEG study: None Technical aspects: This EEG study was done with scalp electrodes positioned according to the 10-20 International system of electrode placement. Electrical activity was reviewed with band pass filter  of 1-70Hz , sensitivity of 7 uV/mm, display speed of 71mm/sec with a 60Hz  notched filter applied as appropriate. EEG data were recorded continuously and digitally stored.  Video monitoring was available and reviewed as appropriate. Description: Per EEG tech, could not get all electrodes under 10 ohms due to condition of pts scalp. O1 P3 Pz P4 C3 and Cz will not reach scalp despite multiple attempts. Hyperventilation and photic stimulation were not performed.   IMPRESSION: This study was not interpretable due to significant electrode artifact. Arlin KIDD Shelton   DG Chest 2 View Result Date: 12/31/2023 EXAM: 2 VIEW(S) XRAY OF THE CHEST 12/31/2023 10:02:00 PM COMPARISON: None available. CLINICAL HISTORY: Evaluate for acute chest, sickle cell. Sickle cell. FINDINGS: LUNGS AND PLEURA: No focal pulmonary opacity. No pulmonary edema. No pleural effusion. No pneumothorax. HEART AND MEDIASTINUM: No acute abnormality of the cardiac and mediastinal silhouettes. BONES AND SOFT TISSUES: No acute osseous abnormality. IMPRESSION: 1. No acute process. Electronically signed by: Franky Stanford MD 12/31/2023 10:12 PM EDT RP Workstation: [id number]   CT Head Wo Contrast Result Date: 12/31/2023 EXAM: CT HEAD WITHOUT CONTRAST 12/31/2023 10:07:18 PM TECHNIQUE: CT of the head was performed without the administration of intravenous contrast. Automated exposure control, iterative reconstruction, and/or weight based adjustment of the mA/kV was utilized to reduce the radiation dose to as low as reasonably achievable. COMPARISON: 03/02/2017 CLINICAL HISTORY: Seizure, sickle cell, history of intracerebral hemorrhage (ICH). Patient brought in by EMS for seizure. Patient was at church and not acting normal. Patient was described to be having a seizure by mother. The patient has a history of seizures, sickle cell, and aneurysms causing strokes per EMS. Patient crying and unable to console at triage. FINDINGS: BRAIN AND VENTRICLES: No acute  hemorrhage. Gray-white differentiation is preserved. No hydrocephalus. No extra-axial collection. No mass effect or midline shift. ORBITS: No acute abnormality. SINUSES: No acute abnormality. SOFT TISSUES AND SKULL: No acute soft tissue abnormality. No skull fracture. Cerebellar tonsils extend 12 mm below the foramen magnum consistent with Chiari malformation. IMPRESSION: 1. No acute intracranial abnormality. 2. 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 Electronically signed by: Franky Stanford MD 12/31/2023 10:11 PM EDT RP Workstation: [id number]    Medications: Scheduled Meds:  enoxaparin  (LOVENOX ) injection  40 mg Subcutaneous Q24H   fluticasone  furoate-vilanterol  1 puff Inhalation Daily   HYDROmorphone    Intravenous Q4H   hydroxyurea   1,500 mg Oral Daily   ketorolac   15 mg Intravenous Q6H   senna-docusate  1 tablet Oral BID   Continuous Infusions: PRN Meds:.albuterol , diphenhydrAMINE , hydrOXYzine , naloxone  **AND** sodium chloride  flush, ondansetron  (ZOFRAN ) IV, polyethylene glycol  Consultants: None  Procedures: MRI pending  Antibiotics: None  Assessment/Plan: Principal Problem:   Sickle cell pain crisis (HCC) Active Problems:   Leukocytosis   Anemia of chronic disease   Asthma, chronic   Seizure-like activity (  HCC)   Hb Sickle Cell Disease with Pain crisis: Continue IVF 0.45% Saline KVO, continue weight based Dilaudid  PCA, IV Toradol  15 mg Q 6 H for a total of 5 days, continue oral home pain medications as ordered. Monitor vitals very closely, Re-evaluate pain scale regularly, 2 L of Oxygen by [place]. Patient encouraged to ambulate on the hallway today.  Leukocytosis: Slightly elevated possibly due to vaso-occlusive crisis.  No acute signs or symptoms of infection.  Will continue to monitor daily CBC without antibiotic intervention. Anemia of Chronic Disease: Hemoglobin slightly lower than patient's baseline.  Will continue to monitor  no need for transfusion at this time. Chronic pain Syndrome: Continue oral home pain medication. Asthma, chronic: Patient is stable at this time.  Continue as needed medication as prescribed.   Code Status: Full Code Family Communication: N/A Disposition Plan: Not yet ready for discharge  [name] Cover NP   If 7PM-7AM, please contact night-coverage.  01/02/2024, 1:59 PM  LOS: 1 day

## 2024-01-02 NOTE — Plan of Care (Signed)
  Problem: Education: Goal: Knowledge of General Education information will improve Description: Including pain rating scale, medication(s)/side effects and non-pharmacologic comfort measures Outcome: Progressing   Problem: Health Behavior/Discharge Planning: Goal: Ability to manage health-related needs will improve Outcome: Progressing   Problem: Clinical Measurements: Goal: Ability to maintain clinical measurements within normal limits will improve Outcome: Progressing Goal: Will remain free from infection Outcome: Progressing Goal: Diagnostic test results will improve Outcome: Progressing Goal: Respiratory complications will improve Outcome: Progressing Goal: Cardiovascular complication will be avoided Outcome: Progressing   Problem: Nutrition: Goal: Adequate nutrition will be maintained Outcome: Progressing   Problem: Coping: Goal: Level of anxiety will decrease Outcome: Progressing   Problem: Pain Managment: Goal: General experience of comfort will improve and/or be controlled Outcome: Progressing   Problem: Safety: Goal: Ability to remain free from injury will improve Outcome: Progressing   Problem: Education: Goal: Knowledge of vaso-occlusive preventative measures will improve Outcome: Progressing Goal: Awareness of infection prevention will improve Outcome: Progressing Goal: Awareness of signs and symptoms of anemia will improve Outcome: Progressing Goal: Long-term complications will improve Outcome: Progressing   Problem: Self-Care: Goal: Ability to incorporate actions that prevent/reduce pain crisis will improve Outcome: Progressing   Problem: Bowel/Gastric: Goal: Gut motility will be maintained Outcome: Progressing   Problem: Respiratory: Goal: Pulmonary complications will be avoided or minimized Outcome: Progressing Goal: Acute Chest Syndrome will be identified early to prevent complications Outcome: Progressing   Problem: Fluid Volume: Goal:  Ability to maintain a balanced intake and output will improve Outcome: Progressing   Problem: Sensory: Goal: Pain level will decrease with appropriate interventions Outcome: Progressing

## 2024-01-03 MED ORDER — BUPRENORPHINE 20 MCG/HR TD PTWK: 1.0000 | MEDICATED_PATCH | TRANSDERMAL | Status: DC

## 2024-01-03 MED ORDER — SODIUM CHLORIDE 0.9% FLUSH: 10.0000 mL | INTRAVENOUS | Status: DC | PRN

## 2024-01-03 MED ORDER — SODIUM CHLORIDE 0.9% FLUSH
10.0000 mL | Freq: Two times a day (BID) | INTRAVENOUS | Status: DC
  Administered 2024-01-03 (×2): 10 mL

## 2024-01-03 NOTE — Plan of Care (Signed)
  Problem: Education: Goal: Knowledge of General Education information will improve Description: Including pain rating scale, medication(s)/side effects and non-pharmacologic comfort measures Outcome: Progressing   Problem: Health Behavior/Discharge Planning: Goal: Ability to manage health-related needs will improve Outcome: Progressing   Problem: Clinical Measurements: Goal: Ability to maintain clinical measurements within normal limits will improve Outcome: Progressing Goal: Will remain free from infection Outcome: Progressing Goal: Diagnostic test results will improve Outcome: Progressing Goal: Respiratory complications will improve Outcome: Progressing Goal: Cardiovascular complication will be avoided Outcome: Progressing   Problem: Activity: Goal: Risk for activity intolerance will decrease Outcome: Progressing   Problem: Nutrition: Goal: Adequate nutrition will be maintained Outcome: Progressing   Problem: Elimination: Goal: Will not experience complications related to bowel motility Outcome: Progressing Goal: Will not experience complications related to urinary retention Outcome: Progressing   Problem: Pain Managment: Goal: General experience of comfort will improve and/or be controlled Outcome: Progressing   Problem: Education: Goal: Knowledge of vaso-occlusive preventative measures will improve Outcome: Progressing Goal: Awareness of infection prevention will improve Outcome: Progressing Goal: Awareness of signs and symptoms of anemia will improve Outcome: Progressing Goal: Long-term complications will improve Outcome: Progressing   Problem: Bowel/Gastric: Goal: Gut motility will be maintained Outcome: Progressing   Problem: Tissue Perfusion: Goal: Complications related to inadequate tissue perfusion will be avoided or minimized Outcome: Progressing   Problem: Sensory: Goal: Pain level will decrease with appropriate interventions Outcome:  Progressing   Problem: Health Behavior: Goal: Postive changes in compliance with treatment and prescription regimens will improve Outcome: Progressing

## 2024-01-03 NOTE — Progress Notes (Signed)

## 2024-01-03 NOTE — Discharge Summary (Signed)
 Physician Discharge Summary  KORA GROOM FMW:[id number] DOB: 08/28/2005 DOA: 01/01/2024  PCP: Morgan Slater Pizza, MD  Admit date: 01/01/2024  Discharge date: 01/03/2024  Discharge Diagnoses:  Principal Problem:   Sickle cell pain crisis (HCC) Active Problems:   Leukocytosis   Anemia of chronic disease   Asthma, chronic   Seizure-like activity (HCC)   Discharge Condition: Stable  Disposition:  Pt is discharged home in good condition and is to follow up with Little, Slater Pizza, MD this week to have labs evaluated. [name] is instructed to increase activity slowly and balance with rest for the next few days, and use prescribed medication to complete treatment of pain  Diet: Regular Wt Readings from Last 3 Encounters:  01/01/24 71.7 kg (86%, Z= 1.08)*  12/31/23 65.8 kg (75%, Z= 0.68)*  12/26/23 65.8 kg (75%, Z= 0.68)*   * Growth percentiles are based on CDC (Girls, 2-20 Years) data.    History of present illness:  Bailey Reese is a 18 y.o. female with medical history significant of sickle cell disease, chronic pain, history of hemorrhagic stroke in 2017, asthma, PTSD, depression, anxiety, GERD presenting from [facility] ED due to concern for sickle cell pain crisis and possible seizure-like activity.   Patient states she had a seizure at church and bystanders called ambulance and she was brought to the emergency room.  She reports history of seizures in the past but never started on any medications.  She reports having severe generalized pain all over her body for the past few days.  States she used to be on oxycodone  for her sickle cell pain but her doctor stopped it a few months ago and since then she has been hospitalized multiple times for sickle cell pain.  Reports subjective fevers and chills.  Reports cough and shortness of breath.  Denies nausea, vomiting, diarrhea, or any urinary symptoms.    ED course:  Afebrile.  Labs notable for WBC count 20.4, hemoglobin  close to baseline at 8.3, bicarb 19, anion gap 17, glucose 80, creatinine 0.65, total protein 8.9, AST and T. bili elevated on previous labs as well and no significant change, ALT and alk phos normal.  No acute findings on chest x-ray and head CT.  Patient was given Ativan , Zofran , IV fluids, and multiple rounds of pain meds including IV Toradol  and IV Dilaudid  in the ED. Pain remained unresolved, she was admitted inpatient for ongoing sickle cell pain management.    Hospital Course:  Patient was admitted for sickle cell pain crisis and managed appropriately with IVF, IV Dilaudid  via PCA and IV Toradol , as well as other adjunct therapies per sickle cell pain management protocols. MRI completed for new onset seizure subjectively reported by patient is negative. Patient is reporting only slight improvement to pain, however she insists on going home as she is frustrated with her chronic pain management regimen at home. Patient reports that she is not doing well, her pain is not controlled on the pain patch and would like to change her care provider from [facility] to [facility] . Appointment made and scheduled for 02/09/2024. Patient was therefore discharged home today in a hemodynamically stable condition.   Florabelle will follow-up with PCP within 1 week of this discharge. Rudean was counseled extensively about nonpharmacologic means of pain management, patient verbalized understanding and was appreciative of  the care received during this admission.   We discussed the need for good hydration, monitoring of hydration status, avoidance of heat, cold, stress,  and infection triggers. We discussed the need to be adherent with taking home medications. Patient was reminded of the need to seek medical attention immediately if any symptom of bleeding, anemia, or infection occurs.  Discharge Exam: Vitals:   01/03/24 0856 01/03/24 1122  BP:    Pulse:    Resp:  17  Temp:    SpO2: 99% 100%   Vitals:   01/03/24 0725  01/03/24 0855 01/03/24 0856 01/03/24 1122  BP:      Pulse:      Resp: 13   17  Temp:      TempSrc:      SpO2: 100% 99% 99% 100%  Weight:      Height:        General appearance : Awake, alert, not in any distress. Speech Clear. Not toxic looking HEENT: Atraumatic and Normocephalic, pupils equally reactive to light and accomodation Neck: Supple, no JVD. No cervical lymphadenopathy.  Chest: Good air entry bilaterally, no added sounds  CVS: S1 S2 regular, no murmurs.  Abdomen: Bowel sounds present, Non tender and not distended with no gaurding, rigidity or rebound. Extremities: B/L Lower Ext shows no edema, both legs are warm to touch Neurology: Awake alert, and oriented X 3, CN II-XII intact, Non focal Skin: No Rash  Discharge Instructions  Discharge Instructions     Call MD for:  severe uncontrolled pain   Complete by: As directed    Call MD for:  temperature >100.4   Complete by: As directed    Diet - low sodium heart healthy   Complete by: As directed    Increase activity slowly   Complete by: As directed       Allergies as of 01/03/2024       Reactions   Cherry Anaphylaxis, Itching, Swelling   Olanzapine  Other (See Comments)   Drug-induced liver injury   Other Swelling, Other (See Comments)   Pitted fruits   Peanut-containing Drug Products Anaphylaxis, Itching, Swelling   Plum Pulp Anaphylaxis, Hives   Droperidol  Other (See Comments)   Pt states muscle tension, involuntary movements, eye drift, anxiety   Morphine  And Codeine Hives, Itching   Peach [prunus Persica] Itching, Swelling, Other (See Comments)   Acetaminophen  Nausea And Vomiting   Compazine  [prochlorperazine ] Other (See Comments), Hypertension   Patient stated that this medication causes her HR to increase and well as BP. She also states that it cause hallucinations.   Ibuprofen  Nausea And Vomiting        Medication List     TAKE these medications    budesonide-formoterol  80-4.5 MCG/ACT  inhaler Commonly known as: SYMBICORT Inhale 2 puffs into the lungs in the morning and at bedtime.   buprenorphine  20 MCG/HR Ptwk Commonly known as: BUTRANS  Place 1 patch onto the skin once a week.   CALCIUM 600+D3 PO Take 2 tablets by mouth daily.   Deferasirox  360 MG Tabs Take 360 mg by mouth 2 (two) times daily.   ergocalciferol 1.25 MG (50000 UT) capsule Commonly known as: VITAMIN D2 Take 50,000 Units by mouth once a week.   esomeprazole  40 MG capsule Commonly known as: NEXIUM  Take 1 capsule (40 mg total) by mouth daily as needed (reflux).   fexofenadine 180 MG tablet Commonly known as: ALLEGRA Take 180 mg by mouth daily as needed for allergies.   hydroxyurea  500 MG capsule Commonly known as: HYDREA  Take 1,500 mg by mouth daily. May take with food to minimize GI side effects.   hydrOXYzine  25 MG  tablet Commonly known as: ATARAX  Take 25 mg by mouth See admin instructions. Take 25mg  (1 tablet) by mouth twice daily and an extra tablet if needed.   lubiprostone  24 MCG capsule Commonly known as: AMITIZA  Take 24 mcg by mouth in the morning and at bedtime.   ondansetron  4 MG tablet Commonly known as: ZOFRAN  Take 4 mg by mouth.   Oxycodone  HCl 10 MG Tabs Take 1 tablet (10 mg total) by mouth every 6 (six) hours as needed (pain).   pantoprazole  40 MG tablet Commonly known as: Protonix  Take 1 tablet (40 mg total) by mouth 2 (two) times daily. What changed:  when to take this reasons to take this   prazosin  1 MG capsule Commonly known as: MINIPRESS  Take 1 mg by mouth at bedtime.   pregabalin  150 MG capsule Commonly known as: LYRICA  Take 150 mg by mouth 3 (three) times daily.        The results of significant diagnostics from this hospitalization (including imaging, microbiology, ancillary and laboratory) are listed below for reference.    Significant Diagnostic Studies: MR BRAIN W WO CONTRAST Result Date: 01/02/2024 EXAM: MRI BRAIN WITH AND WITHOUT CONTRAST  01/02/2024 03:45:45 PM TECHNIQUE: Multiplanar multisequence MRI of the head/brain was performed with and without the administration of intravenous contrast. 7mL gadavist  (gadobutrol  1 mmol/mL). COMPARISON: CT head 12/31/2023. CLINICAL HISTORY: Seizure, new-onset, no history of trauma. FINDINGS: BRAIN AND VENTRICLES: No acute infarct. No acute intracranial hemorrhage. No mass effect or midline shift. The ventricles are normal in size. The cerebellar tonsils extend 18 mm below the foramen magnum and are compressed. The CSF spaces at the foramen magnum are effaced. No brain sagging. The brain is normal in signal, and no abnormal enhancement is identified . Dedicated thin section imaging through the temporal lobes demonstrates normal volume and signal of the hippocampi. There is no evidence of heterotopia or cortical dysplasia. ORBITS: No acute abnormality. SINUSES: No acute abnormality. BONES AND SOFT TISSUES: Diffusely diminished bone marrow T1 signal intensity, nonspecific but compatible with the history of sickle cell disease. IMPRESSION: 1. Chiari I malformation. 2. Otherwise negative brain MRI. Electronically signed by: Dasie Hamburg MD 01/02/2024 04:29 PM EDT RP Workstation: [id number]   EEG adult Result Date: 01/01/2024 Shelton Arlin KIDD, MD     01/01/2024  8:44 PM Patient Name: Bailey Reese MRN: [id number] Epilepsy Attending: Arlin KIDD Shelton Referring Physician/Provider: Alfornia Madison, MD Date: 01/01/2024 Duration: 27.29 mins Patient history: 18 y.o. female with possible seizure-like activity. EEG to evaluate for seizure. Level of alertness: Awake AEDs during EEG study: None Technical aspects: This EEG study was done with scalp electrodes positioned according to the 10-20 International system of electrode placement. Electrical activity was reviewed with band pass filter of 1-70Hz , sensitivity of 7 uV/mm, display speed of 37mm/sec with a 60Hz  notched filter applied as appropriate. EEG data were  recorded continuously and digitally stored.  Video monitoring was available and reviewed as appropriate. Description: Per EEG tech, could not get all electrodes under 10 ohms due to condition of pts scalp. O1 P3 Pz P4 C3 and Cz will not reach scalp despite multiple attempts. Hyperventilation and photic stimulation were not performed.   IMPRESSION: This study was not interpretable due to significant electrode artifact. Arlin KIDD Shelton   DG Chest 2 View Result Date: 12/31/2023 EXAM: 2 VIEW(S) XRAY OF THE CHEST 12/31/2023 10:02:00 PM COMPARISON: None available. CLINICAL HISTORY: Evaluate for acute chest, sickle cell. Sickle cell. FINDINGS: LUNGS AND PLEURA: No focal pulmonary opacity.  No pulmonary edema. No pleural effusion. No pneumothorax. HEART AND MEDIASTINUM: No acute abnormality of the cardiac and mediastinal silhouettes. BONES AND SOFT TISSUES: No acute osseous abnormality. IMPRESSION: 1. No acute process. Electronically signed by: Franky Stanford MD 12/31/2023 10:12 PM EDT RP Workstation: [id number]   CT Head Wo Contrast Result Date: 12/31/2023 EXAM: CT HEAD WITHOUT CONTRAST 12/31/2023 10:07:18 PM TECHNIQUE: CT of the head was performed without the administration of intravenous contrast. Automated exposure control, iterative reconstruction, and/or weight based adjustment of the mA/kV was utilized to reduce the radiation dose to as low as reasonably achievable. COMPARISON: 03/02/2017 CLINICAL HISTORY: Seizure, sickle cell, history of intracerebral hemorrhage (ICH). Patient brought in by EMS for seizure. Patient was at church and not acting normal. Patient was described to be having a seizure by mother. The patient has a history of seizures, sickle cell, and aneurysms causing strokes per EMS. Patient crying and unable to console at triage. FINDINGS: BRAIN AND VENTRICLES: No acute hemorrhage. Gray-white differentiation is preserved. No hydrocephalus. No extra-axial collection. No mass effect or midline shift.  ORBITS: No acute abnormality. SINUSES: No acute abnormality. SOFT TISSUES AND SKULL: No acute soft tissue abnormality. No skull fracture. Cerebellar tonsils extend 12 mm below the foramen magnum consistent with Chiari malformation. IMPRESSION: 1. No acute intracranial abnormality. 2. 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 Electronically signed by: Franky Stanford MD 12/31/2023 10:11 PM EDT RP Workstation: [id number]   DG Chest 2 View Result Date: 12/26/2023 EXAM: 2 VIEW(S) XRAY OF THE CHEST 12/26/2023 94:71:71 AM COMPARISON: 07/13/2023 CLINICAL HISTORY: ?acute chest - whole body SSD pain, but with cough and chest pain too. PER ER NOTE; Pt to ED via EMS from home, pt reports pain all over body that is throbbing, pt has hx sickle cell. Pt reports she was seen here for same 2 weeks ago. FINDINGS: LUNGS AND PLEURA: No focal pulmonary opacity. No pulmonary edema. No pleural effusion. No pneumothorax. HEART AND MEDIASTINUM: No acute abnormality of the cardiac and mediastinal silhouettes. BONES AND SOFT TISSUES: No acute osseous abnormality. IMPRESSION: 1. No acute process. Electronically signed by: Waddell Calk MD 12/26/2023 06:56 AM EDT RP Workstation: [id number]   CT Angio Chest PE W and/or Wo Contrast Result Date: 12/11/2023 CLINICAL DATA:  History of sickle cell disease with chest pain, initial encounter EXAM: CT ANGIOGRAPHY CHEST WITH CONTRAST TECHNIQUE: Multidetector CT imaging of the chest was performed using the standard protocol during bolus administration of intravenous contrast. Multiplanar CT image reconstructions and MIPs were obtained to evaluate the vascular anatomy. RADIATION DOSE REDUCTION: This exam was performed according to the departmental dose-optimization program which includes automated exposure control, adjustment of the mA and/or kV according to patient size and/or use of iterative reconstruction technique. CONTRAST:  75mL OMNIPAQUE  IOHEXOL  350  MG/ML SOLN COMPARISON:  05/27/2023 FINDINGS: Cardiovascular: Thoracic aorta and its branches are within normal limits. Heart is mildly prominent but stable. The pulmonary artery shows a normal branching pattern bilaterally. No filling defect to suggest pulmonary embolism is noted. Mediastinum/Nodes: Thoracic inlet is within normal limits. No hilar or mediastinal adenopathy is noted. The esophagus as visualized is within normal limits. Lungs/Pleura: Lungs are well aerated bilaterally. No focal infiltrate or sizable effusion is seen. No sizable nodule is seen. Upper Abdomen: Visualized upper abdomen is within normal limits. Musculoskeletal: No acute abnormality is noted. Mild endplate changes consistent with sickle cell are noted. Review of the MIP images confirms the above findings. IMPRESSION: No evidence of pulmonary  emboli. No acute abnormality noted. Electronically Signed   By: Oneil Devonshire M.D.   On: 12/11/2023 01:12   DG Chest Port 1 View Result Date: 12/10/2023 CLINICAL DATA:  Chest pain.  Sickle cell crisis. EXAM: PORTABLE CHEST 1 VIEW COMPARISON:  12/01/2023 FINDINGS: The cardiac silhouette, mediastinal and hilar contours are within normal limits given the AP projection and portable technique. The lungs are clear. No pleural effusions. The bony thorax is intact. IMPRESSION: No acute cardiopulmonary findings. Electronically Signed   By: MYRTIS Stammer M.D.   On: 12/10/2023 21:59    Microbiology: No results found for this or any previous visit (from the past 240 hours).   Labs: Basic Metabolic Panel: Recent Labs  Lab 12/31/23 2151 01/01/24 0528  NA 137 136  K 4.3 3.8  CL 101 108  CO2 19* 21*  GLUCOSE 80 89  BUN 11 9  CREATININE 0.65 0.45  CALCIUM 9.7 8.7*   Liver Function Tests: Recent Labs  Lab 12/31/23 2151 01/01/24 0528  AST 52* 29  ALT 32 25  ALKPHOS 106 93  BILITOT 5.9* 4.8*  PROT 8.9* 7.1  ALBUMIN 4.7 3.6   No results for input(s): LIPASE, AMYLASE in the last 168  hours. No results for input(s): AMMONIA in the last 168 hours. CBC: Recent Labs  Lab 12/31/23 2151 01/01/24 0528 01/02/24 0651  WBC 20.4* 15.0* 10.8*  NEUTROABS 14.7*  --   --   HGB 8.3* 7.3* 7.3*  HCT 23.9* 22.2* 21.9*  MCV 93.7 97.4 95.6  PLT 364 372 383   Cardiac Enzymes: No results for input(s): CKTOTAL, CKMB, CKMBINDEX, TROPONINI in the last 168 hours. BNP: Invalid input(s): POCBNP CBG: No results for input(s): GLUCAP in the last 168 hours.  Time coordinating discharge: 50 minutes  Signed:  [name] Cover NP   [facility] 01/03/2024, 1:10 PM

## 2024-01-04 NOTE — Progress Notes (Signed)
 Clinical Pharmacist Practitioner: [facility]     Bailey Reese is a 18 y.o. female with hgb SS disease who called for oxycodone  fill after discharge on 01/03/24    Current pain regimen:   Butrans  patch 20 mcg/hour once weekly  Oxycodone  10 mg q6hr prn #20 post discharge for sickle pain crisis  Current hydroxyurea  dose: 1500 mg once daily    Plan:  -ok for oxycodone  fill today #20 with following hospital discharge yesterday (admitted 8/18-8/20 with pain and possible seizure-like activity).  -Continue buprenorphine  20 mcg patch weekly, refill today  -follow-up video visit with me on aug 27th    Prescriptions:  -oxycodone  10 mg #20, prescription sent electronically to patient's preferred pharmacy [facility]      F/u 8/27 with CPP  ___________________________________________________________________    Interim Pain History:        Patient reported following on 11/08/23:  -had not tolerated acetaminophen  and ibuprofen  due to GI side effects  -had tried muscle rub cream with no pain relief  -had difficulty sleeping last night, took 4 tablets of doxylamine (OTC sleep aid) through the night, last dose this morning at 4:30 am, felt groggy and went to the ED this morning, left ED   -removed butrans  patch before shower, had not replaced    Reported Description of Pain:  Location:  back, thigh  Character:  aching        [place]CCS[place] database was reviewed today and it was appropriate.    There are no aberrant drug related behaviors observed.  Discussed medication adherence and safety with patient, who is in agreement with treatment plan as outlined above.     Interim hydroxyurea  history:  Current Hydroxyurea  regimen: 1500 mg once daily  (patient takes in the morning)  Patient reported missing 1 dose in the last week   Side effects: none  MCV on 11/08/23 was 95.4   Reasons for non adherence busy life schedule       Lindajean Door, PharmD, BCOP, CPP  Clinical Pharmacist Practitioner, Benign Hematology      Pain Action Plan Sickle Cell Team Hemoglobin Type   Slater Ly, MD, Belvie Cecil, MD, Jayson Blush, MD, Rexene Fenton, ANP, Maeola Blumenthal, AGPCNP-BC Hemoglobin SS       Emergency numbers:  [phone] (24 hours)     Call 911 or come to the emergency department immediately if:  Chest pain and difficulty breathing  Stroke symptoms  Vision changes      Common triggers for pain crisis and how to prevent:  Cold weather: Dress warmly and in layers  Swim/water activities: Limit time in the water, change out of wet clothes  Dehydration: Drink more liquids  Exercise: Warm up, drink plenty of liquids, take breaks  Hot weather: Drink plenty of fluids, limit time outside  Infection: Wash hands, avoid sick contacts  Stress: Work with family/friends/medical team to improve coping skill and learn techniques to lower stress levels  Emotions: Consider ways to deal with emotions: journaling, art therapy, talking with friends/family  Trauma: Be careful with chosen activities          GREEN ZONE - GO   Symptoms   No pain or pain is at normal level (at baseline)     Advice   Continue all regularly scheduled medications  Continue ways to prevent triggers for pain crisis (refer above)  Check your medication supply on a regular basis and call your hematology team if you are running out of  medications.    Regularly Scheduled  Medications  Folic acid : Helps make new red blood cells  Hydroxyurea : Helps prevent pain and other problems related to sickle cell disease Medications for chronic or nerve pain    Pregabalin  (Lyrica )  Butrans  patch            YELLOW ZONE - CAUTION   Symptoms:  Increased pain, more than baseline pain  Onset of new pain episode     Advice:  Start your ???as needed??? medications  (both can be taken at the same time)  Continue your regular ???Green Zone??? medications   Increase hydration, rest, stay warm, use warm/hot packs  Can also try over the counter topical ointments  Discuss with your hematology team some distraction techniques that you can try at home to help manage your pain   Call your hematology team if you are not improving or you are running out of medications!    Anti-inflammatory medication  Decreases pain and swelling    none    Topical anti-inflammatory or analgesic medication  Placed on the skin to provide pain relief    Capsacin   Lidocaine (gel or patch) Analgesic medication  Decreases pain    Oxycodone  (Roxicodone )  Capsaicin Cream and patch  Lidocaine cream                RED ZONE - DANGER   Symptoms:  Pain not controlled at home with ???as needed??? medications after 24 hours OR pain worsening or becomes severe  Fever  Difficulty breathing or pain in the chest  Extreme fatigue or pallor  Increased spleen size  Signs of stroke - difficulty walking/talking  Signs of a blood clot in the leg - pain, swelling, changes in color     Advice:  CALL your hematology team immediately and/or GO to your nearest hospital.

## 2024-01-05 ENCOUNTER — Emergency Department

## 2024-01-05 ENCOUNTER — Emergency Department
Admission: EM | Admit: 2024-01-05 | Discharge: 2024-01-05 | Discharge status: Against Medical Advice | Attending: Emergency Medicine | Admitting: Emergency Medicine

## 2024-01-05 ENCOUNTER — Other Ambulatory Visit: Payer: Self-pay

## 2024-01-05 DIAGNOSIS — Z5321 Procedure and treatment not carried out due to patient leaving prior to being seen by health care provider: Secondary | ICD-10-CM | POA: Diagnosis not present

## 2024-01-05 DIAGNOSIS — R0602 Shortness of breath: Secondary | ICD-10-CM | POA: Diagnosis present

## 2024-01-05 LAB — CBC
HCT: 23.1 % — ABNORMAL LOW (ref 36.0–46.0)
Hemoglobin: 7.9 g/dL — ABNORMAL LOW (ref 12.0–15.0)
MCH: 32.4 pg (ref 26.0–34.0)
MCHC: 34.2 g/dL (ref 30.0–36.0)
MCV: 94.7 fL (ref 80.0–100.0)
Platelets: 473 K/uL — ABNORMAL HIGH (ref 150–400)
RBC: 2.44 MIL/uL — ABNORMAL LOW (ref 3.87–5.11)
RDW: 19.8 % — ABNORMAL HIGH (ref 11.5–15.5)
WBC: 13.3 K/uL — ABNORMAL HIGH (ref 4.0–10.5)
nRBC: 3.2 % — ABNORMAL HIGH (ref 0.0–0.2)

## 2024-01-05 LAB — BASIC METABOLIC PANEL WITH GFR
Anion gap: 13 (ref 5–15)
BUN: 6 mg/dL (ref 6–20)
CO2: 23 mmol/L (ref 22–32)
Calcium: 9.4 mg/dL (ref 8.9–10.3)
Chloride: 102 mmol/L (ref 98–111)
Creatinine, Ser: 0.49 mg/dL (ref 0.44–1.00)
GFR, Estimated: >60 mL/min (ref >60)
Glucose, Bld: 94 mg/dL (ref 70–99)
Potassium: 3.5 mmol/L (ref 3.5–5.1)
Sodium: 138 mmol/L (ref 135–145)

## 2024-01-05 LAB — TROPONIN I (HIGH SENSITIVITY): Troponin I (High Sensitivity): 4 ng/L (ref <18)

## 2024-01-05 NOTE — ED Triage Notes (Signed)
 First Nurse Note: Patient to ED via [facility] from home for SOB. PT reports being woken up out of sleep SOB. HX pneumonia 1 month ago. Given 324 ASA by EMS   99% RA

## 2024-01-05 NOTE — ED Triage Notes (Signed)
 Pt to ED via [facility] from home for SOB. Pt states unable to sleep and heart was beating funny. Hx pneumonia 1 month ago. Given 324 ASA by EMS.

## 2024-01-05 NOTE — ED Notes (Signed)
 Patient called x3 with no answer from the lobby. Pt not visualized and not in lobby bathroom.

## 2024-01-05 NOTE — ED Notes (Signed)
 Pt called x1 with no answer from the lobby

## 2024-01-15 ENCOUNTER — Other Ambulatory Visit: Payer: Self-pay

## 2024-01-15 ENCOUNTER — Inpatient Hospital Stay (HOSPITAL_COMMUNITY)
Admit: 2024-01-15 | Discharge: 2024-01-26 | DRG: 811 | Disposition: A | Discharge status: Home / Self Care | Source: Other Acute Inpatient Hospital | Attending: Internal Medicine | Admitting: Internal Medicine

## 2024-01-15 ENCOUNTER — Encounter: Payer: Self-pay | Admitting: Emergency Medicine

## 2024-01-15 ENCOUNTER — Encounter (HOSPITAL_COMMUNITY): Payer: Self-pay | Admitting: Internal Medicine

## 2024-01-15 ENCOUNTER — Emergency Department
Admission: EM | Admit: 2024-01-15 | Discharge: 2024-01-15 | Disposition: A | Discharge status: Other Acute Inpatient Hospital

## 2024-01-15 ENCOUNTER — Emergency Department

## 2024-01-15 ENCOUNTER — Encounter (HOSPITAL_COMMUNITY): Payer: Self-pay

## 2024-01-15 DIAGNOSIS — T801XXA Vascular complications following infusion, transfusion and therapeutic injection, initial encounter: Secondary | ICD-10-CM | POA: Diagnosis not present

## 2024-01-15 DIAGNOSIS — I808 Phlebitis and thrombophlebitis of other sites: Secondary | ICD-10-CM | POA: Diagnosis not present

## 2024-01-15 DIAGNOSIS — D5700 Hb-SS disease with crisis, unspecified: Principal | ICD-10-CM | POA: Diagnosis present

## 2024-01-15 DIAGNOSIS — B9561 Methicillin susceptible Staphylococcus aureus infection as the cause of diseases classified elsewhere: Principal | ICD-10-CM

## 2024-01-15 DIAGNOSIS — J45909 Unspecified asthma, uncomplicated: Secondary | ICD-10-CM | POA: Diagnosis present

## 2024-01-15 DIAGNOSIS — Y828 Other medical devices associated with adverse incidents: Secondary | ICD-10-CM | POA: Diagnosis not present

## 2024-01-15 DIAGNOSIS — D638 Anemia in other chronic diseases classified elsewhere: Secondary | ICD-10-CM | POA: Diagnosis present

## 2024-01-15 DIAGNOSIS — G894 Chronic pain syndrome: Secondary | ICD-10-CM | POA: Diagnosis present

## 2024-01-15 DIAGNOSIS — D571 Sickle-cell disease without crisis: Secondary | ICD-10-CM | POA: Diagnosis present

## 2024-01-15 DIAGNOSIS — D72829 Elevated white blood cell count, unspecified: Secondary | ICD-10-CM | POA: Diagnosis present

## 2024-01-15 DIAGNOSIS — Z91010 Allergy to peanuts: Secondary | ICD-10-CM

## 2024-01-15 DIAGNOSIS — F410 Panic disorder [episodic paroxysmal anxiety] without agoraphobia: Secondary | ICD-10-CM | POA: Insufficient documentation

## 2024-01-15 DIAGNOSIS — D849 Immunodeficiency, unspecified: Secondary | ICD-10-CM | POA: Diagnosis present

## 2024-01-15 DIAGNOSIS — F411 Generalized anxiety disorder: Secondary | ICD-10-CM | POA: Diagnosis present

## 2024-01-15 DIAGNOSIS — Z79899 Other long term (current) drug therapy: Secondary | ICD-10-CM

## 2024-01-15 DIAGNOSIS — Z7951 Long term (current) use of inhaled steroids: Secondary | ICD-10-CM | POA: Diagnosis not present

## 2024-01-15 DIAGNOSIS — F4310 Post-traumatic stress disorder, unspecified: Secondary | ICD-10-CM | POA: Diagnosis present

## 2024-01-15 DIAGNOSIS — Z743 Need for continuous supervision: Secondary | ICD-10-CM | POA: Diagnosis not present

## 2024-01-15 DIAGNOSIS — Z888 Allergy status to other drugs, medicaments and biological substances status: Secondary | ICD-10-CM

## 2024-01-15 DIAGNOSIS — Z91018 Allergy to other foods: Secondary | ICD-10-CM

## 2024-01-15 DIAGNOSIS — A4101 Sepsis due to Methicillin susceptible Staphylococcus aureus: Secondary | ICD-10-CM | POA: Diagnosis not present

## 2024-01-15 DIAGNOSIS — D57219 Sickle-cell/Hb-C disease with crisis, unspecified: Secondary | ICD-10-CM | POA: Diagnosis not present

## 2024-01-15 DIAGNOSIS — K219 Gastro-esophageal reflux disease without esophagitis: Secondary | ICD-10-CM | POA: Diagnosis present

## 2024-01-15 DIAGNOSIS — I10 Essential (primary) hypertension: Secondary | ICD-10-CM | POA: Diagnosis present

## 2024-01-15 DIAGNOSIS — Y92239 Unspecified place in hospital as the place of occurrence of the external cause: Secondary | ICD-10-CM | POA: Diagnosis not present

## 2024-01-15 DIAGNOSIS — Z8673 Personal history of transient ischemic attack (TIA), and cerebral infarction without residual deficits: Secondary | ICD-10-CM | POA: Diagnosis not present

## 2024-01-15 DIAGNOSIS — Z885 Allergy status to narcotic agent status: Secondary | ICD-10-CM | POA: Diagnosis not present

## 2024-01-15 DIAGNOSIS — R7881 Bacteremia: Secondary | ICD-10-CM | POA: Diagnosis not present

## 2024-01-15 LAB — CBC
HCT: 23.1 % — ABNORMAL LOW (ref 36.0–46.0)
Hemoglobin: 8.1 g/dL — ABNORMAL LOW (ref 12.0–15.0)
MCH: 33.6 pg (ref 26.0–34.0)
MCHC: 35.1 g/dL (ref 30.0–36.0)
MCV: 95.9 fL (ref 80.0–100.0)
Platelets: 409 K/uL — ABNORMAL HIGH (ref 150–400)
RBC: 2.41 MIL/uL — ABNORMAL LOW (ref 3.87–5.11)
RDW: 22.5 % — ABNORMAL HIGH (ref 11.5–15.5)
WBC: 17.1 K/uL — ABNORMAL HIGH (ref 4.0–10.5)
nRBC: 4.9 % — ABNORMAL HIGH (ref 0.0–0.2)

## 2024-01-15 LAB — CBC WITH DIFFERENTIAL/PLATELET
Abs Immature Granulocytes: 0.22 K/uL — ABNORMAL HIGH (ref 0.00–0.07)
Basophils Absolute: 0.2 K/uL — ABNORMAL HIGH (ref 0.0–0.1)
Basophils Relative: 1 %
Eosinophils Absolute: 0.5 K/uL (ref 0.0–0.5)
Eosinophils Relative: 3 %
HCT: 20.3 % — ABNORMAL LOW (ref 36.0–46.0)
Hemoglobin: 7.2 g/dL — ABNORMAL LOW (ref 12.0–15.0)
Immature Granulocytes: 1 %
Lymphocytes Relative: 26 %
Lymphs Abs: 4.6 K/uL — ABNORMAL HIGH (ref 0.7–4.0)
MCH: 33.5 pg (ref 26.0–34.0)
MCHC: 35.5 g/dL (ref 30.0–36.0)
MCV: 94.4 fL (ref 80.0–100.0)
Monocytes Absolute: 2.5 K/uL — ABNORMAL HIGH (ref 0.1–1.0)
Monocytes Relative: 14 %
Neutro Abs: 9.7 K/uL — ABNORMAL HIGH (ref 1.7–7.7)
Neutrophils Relative %: 55 %
Platelets: 412 K/uL — ABNORMAL HIGH (ref 150–400)
RBC: 2.15 MIL/uL — ABNORMAL LOW (ref 3.87–5.11)
RDW: 22.4 % — ABNORMAL HIGH (ref 11.5–15.5)
Smear Review: NORMAL
WBC: 17.6 K/uL — ABNORMAL HIGH (ref 4.0–10.5)
nRBC: 3.3 % — ABNORMAL HIGH (ref 0.0–0.2)

## 2024-01-15 LAB — BASIC METABOLIC PANEL WITH GFR
Anion gap: 10 (ref 5–15)
BUN: 8 mg/dL (ref 6–20)
CO2: 23 mmol/L (ref 22–32)
Calcium: 9.3 mg/dL (ref 8.9–10.3)
Chloride: 106 mmol/L (ref 98–111)
Creatinine, Ser: 0.4 mg/dL — ABNORMAL LOW (ref 0.44–1.00)
GFR, Estimated: >60 mL/min (ref >60)
Glucose, Bld: 102 mg/dL — ABNORMAL HIGH (ref 70–99)
Potassium: 3.4 mmol/L — ABNORMAL LOW (ref 3.5–5.1)
Sodium: 139 mmol/L (ref 135–145)

## 2024-01-15 LAB — RETICULOCYTES
Immature Retic Fract: 30.6 % — ABNORMAL HIGH (ref 2.3–15.9)
RBC.: 2.85 MIL/uL — ABNORMAL LOW (ref 3.87–5.11)
Retic Count, Absolute: 768.5 K/uL — ABNORMAL HIGH (ref 19.0–186.0)
Retic Ct Pct: 27 % — ABNORMAL HIGH (ref 0.4–3.1)

## 2024-01-15 LAB — LIPASE, BLOOD: Lipase: 28 U/L (ref 11–51)

## 2024-01-15 LAB — CREATININE, SERUM
Creatinine, Ser: 0.47 mg/dL (ref 0.44–1.00)
GFR, Estimated: >60 mL/min (ref >60)

## 2024-01-15 LAB — HCG, QUANTITATIVE, PREGNANCY: hCG, Beta Chain, Quant, S: 1 m[IU]/mL (ref <5)

## 2024-01-15 MED ORDER — HYDROMORPHONE HCL 1 MG/ML IJ SOLN
0.5000 mg | Freq: Once | INTRAMUSCULAR | Status: AC
  Administered 2024-01-15: 0.5 mg via INTRAVENOUS
  Filled 2024-01-15: qty 0.5

## 2024-01-15 MED ORDER — HYDROXYZINE HCL 25 MG PO TABS
25.0000 mg | ORAL_TABLET | Freq: Every day | ORAL | Status: DC | PRN
  Administered 2024-01-23: 25 mg via ORAL
  Filled 2024-01-15: qty 1

## 2024-01-15 MED ORDER — POLYETHYLENE GLYCOL 3350 17 G PO PACK
17.0000 g | PACK | Freq: Every day | ORAL | Status: DC | PRN
  Administered 2024-01-20: 17 g via ORAL
  Filled 2024-01-15 (×2): qty 1

## 2024-01-15 MED ORDER — ONDANSETRON HCL 4 MG/2ML IJ SOLN: 4.0000 mg | Freq: Four times a day (QID) | INTRAMUSCULAR | Status: DC | PRN

## 2024-01-15 MED ORDER — HYDROMORPHONE HCL 1 MG/ML IJ SOLN
1.0000 mg | INTRAMUSCULAR | Status: DC | PRN
  Administered 2024-01-15: 1 mg via INTRAVENOUS
  Filled 2024-01-15: qty 1

## 2024-01-15 MED ORDER — PREGABALIN 75 MG PO CAPS
150.0000 mg | ORAL_CAPSULE | Freq: Three times a day (TID) | ORAL | Status: DC
  Administered 2024-01-15 – 2024-01-26 (×33): 150 mg via ORAL
  Filled 2024-01-15 (×33): qty 2

## 2024-01-15 MED ORDER — PANTOPRAZOLE SODIUM 40 MG PO TBEC
40.0000 mg | DELAYED_RELEASE_TABLET | Freq: Every day | ORAL | Status: DC
  Administered 2024-01-15 – 2024-01-26 (×12): 40 mg via ORAL
  Filled 2024-01-15 (×12): qty 1

## 2024-01-15 MED ORDER — HYDROXYZINE HCL 25 MG PO TABS
25.0000 mg | ORAL_TABLET | Freq: Two times a day (BID) | ORAL | Status: DC
  Administered 2024-01-15 – 2024-01-26 (×22): 25 mg via ORAL
  Filled 2024-01-15 (×22): qty 1

## 2024-01-15 MED ORDER — NALOXONE HCL 0.4 MG/ML IJ SOLN
0.4000 mg | INTRAMUSCULAR | Status: DC | PRN
  Filled 2024-01-15: qty 1

## 2024-01-15 MED ORDER — DIPHENHYDRAMINE HCL 25 MG PO CAPS
25.0000 mg | ORAL_CAPSULE | ORAL | Status: DC | PRN
  Administered 2024-01-18 – 2024-01-24 (×5): 25 mg via ORAL
  Filled 2024-01-15 (×6): qty 1

## 2024-01-15 MED ORDER — KETOROLAC TROMETHAMINE 15 MG/ML IJ SOLN
15.0000 mg | Freq: Four times a day (QID) | INTRAMUSCULAR | Status: DC
  Administered 2024-01-15: 15 mg via INTRAVENOUS
  Filled 2024-01-15: qty 1

## 2024-01-15 MED ORDER — DEFERASIROX 360 MG PO TABS
360.0000 mg | ORAL_TABLET | Freq: Two times a day (BID) | ORAL | Status: DC
  Administered 2024-01-15: 360 mg via ORAL

## 2024-01-15 MED ORDER — ONDANSETRON HCL 4 MG/2ML IJ SOLN
4.0000 mg | Freq: Four times a day (QID) | INTRAMUSCULAR | Status: DC | PRN
  Administered 2024-01-17 – 2024-01-23 (×7): 4 mg via INTRAVENOUS
  Filled 2024-01-15 (×7): qty 2

## 2024-01-15 MED ORDER — ONDANSETRON HCL 4 MG/2ML IJ SOLN
4.0000 mg | Freq: Once | INTRAMUSCULAR | Status: AC
  Administered 2024-01-15: 4 mg via INTRAVENOUS
  Filled 2024-01-15: qty 2

## 2024-01-15 MED ORDER — HYDROMORPHONE 1 MG/ML IV SOLN
INTRAVENOUS | Status: DC
  Administered 2024-01-15: 9 mg via INTRAVENOUS
  Administered 2024-01-15: 30 mg via INTRAVENOUS
  Administered 2024-01-16: 9.5 mg via INTRAVENOUS
  Administered 2024-01-16: 7.5 mg via INTRAVENOUS
  Administered 2024-01-16 (×2): 3 mg via INTRAVENOUS
  Administered 2024-01-16: 9.5 mg via INTRAVENOUS
  Administered 2024-01-16: 4 mg via INTRAVENOUS
  Administered 2024-01-16 – 2024-01-17 (×2): 30 mg via INTRAVENOUS
  Administered 2024-01-17: 8 mg via INTRAVENOUS
  Administered 2024-01-17: 2.5 mg via INTRAVENOUS
  Administered 2024-01-17: 1 mg via INTRAVENOUS
  Administered 2024-01-17: 7.5 mg via INTRAVENOUS
  Administered 2024-01-18: 30 mg via INTRAVENOUS
  Administered 2024-01-18: 10.5 mg via INTRAVENOUS
  Administered 2024-01-18: 10 mg via INTRAVENOUS
  Administered 2024-01-18: 2.5 mg via INTRAVENOUS
  Administered 2024-01-18: 5 mg via INTRAVENOUS
  Administered 2024-01-19: 6 mg via INTRAVENOUS
  Administered 2024-01-19: 11 mg via INTRAVENOUS
  Administered 2024-01-19: 8 mg via INTRAVENOUS
  Administered 2024-01-19: 6.5 mg via INTRAVENOUS
  Administered 2024-01-19: 4.5 mg via INTRAVENOUS
  Administered 2024-01-19 – 2024-01-20 (×2): 30 mg via INTRAVENOUS
  Administered 2024-01-20: 0.5 mg via INTRAVENOUS
  Administered 2024-01-20: 2 mg via INTRAVENOUS
  Administered 2024-01-20: 6 mg via INTRAVENOUS
  Administered 2024-01-20: 8.5 mg via INTRAVENOUS
  Administered 2024-01-20: 2 mg via INTRAVENOUS
  Administered 2024-01-20: 3.5 mg via INTRAVENOUS
  Administered 2024-01-20: 4 mg via INTRAVENOUS
  Administered 2024-01-21 (×2): 30 mg via INTRAVENOUS
  Administered 2024-01-21: 8.5 mg via INTRAVENOUS
  Administered 2024-01-21: 7.5 mg via INTRAVENOUS
  Administered 2024-01-21: 5.5 mg via INTRAVENOUS
  Administered 2024-01-21: 2.5 mg via INTRAVENOUS
  Administered 2024-01-21: 5 mg via INTRAVENOUS
  Administered 2024-01-22: 1.5 mg via INTRAVENOUS
  Administered 2024-01-22: 9.5 mg via INTRAVENOUS
  Administered 2024-01-22: 7 mg via INTRAVENOUS
  Administered 2024-01-22: 30 mg via INTRAVENOUS
  Administered 2024-01-22 (×2): 6 mg via INTRAVENOUS
  Administered 2024-01-22: 7.5 mg via INTRAVENOUS
  Administered 2024-01-22: 2 mg via INTRAVENOUS
  Administered 2024-01-23: 12 mg via INTRAVENOUS
  Administered 2024-01-23: 3 mg via INTRAVENOUS
  Administered 2024-01-23: 3.5 mg via INTRAVENOUS
  Administered 2024-01-23: 2.5 mg via INTRAVENOUS
  Administered 2024-01-23: 9.5 mg via INTRAVENOUS
  Administered 2024-01-23: 30 mg via INTRAVENOUS
  Administered 2024-01-24: 1 mg via INTRAVENOUS
  Administered 2024-01-24: 5 mg via INTRAVENOUS
  Administered 2024-01-24: 4.5 mg via INTRAVENOUS
  Administered 2024-01-24: 0.5 mg via INTRAVENOUS
  Administered 2024-01-24: 1.5 mg via INTRAVENOUS
  Administered 2024-01-25: 2 mg via INTRAVENOUS
  Administered 2024-01-25: 30 mg via INTRAVENOUS
  Administered 2024-01-25: 6 mg via INTRAVENOUS
  Administered 2024-01-25: 3 mg via INTRAVENOUS
  Administered 2024-01-25: 2.5 mg via INTRAVENOUS
  Administered 2024-01-26: 6 mg via INTRAVENOUS
  Administered 2024-01-26: 0.001 mg via INTRAVENOUS
  Administered 2024-01-26: 6.5 mg via INTRAVENOUS
  Administered 2024-01-26: 6 mg via INTRAVENOUS
  Filled 2024-01-15 (×12): qty 30

## 2024-01-15 MED ORDER — BUPRENORPHINE 20 MCG/HR TD PTWK: 1.0000 | MEDICATED_PATCH | TRANSDERMAL | Status: DC

## 2024-01-15 MED ORDER — ENOXAPARIN SODIUM 40 MG/0.4ML IJ SOSY: 40.0000 mg | PREFILLED_SYRINGE | INTRAMUSCULAR | Status: DC

## 2024-01-15 MED ORDER — KETOROLAC TROMETHAMINE 15 MG/ML IJ SOLN
15.0000 mg | Freq: Once | INTRAMUSCULAR | Status: AC
  Administered 2024-01-15: 15 mg via INTRAVENOUS
  Filled 2024-01-15: qty 1

## 2024-01-15 MED ORDER — HYDROMORPHONE 1 MG/ML IV SOLN: INTRAVENOUS | Status: DC

## 2024-01-15 MED ORDER — SENNOSIDES-DOCUSATE SODIUM 8.6-50 MG PO TABS
1.0000 | ORAL_TABLET | Freq: Two times a day (BID) | ORAL | Status: DC
  Administered 2024-01-15 – 2024-01-26 (×22): 1 via ORAL
  Filled 2024-01-15 (×22): qty 1

## 2024-01-15 MED ORDER — ENOXAPARIN SODIUM 40 MG/0.4ML IJ SOSY
40.0000 mg | PREFILLED_SYRINGE | Freq: Every day | INTRAMUSCULAR | Status: DC
  Administered 2024-01-15: 40 mg via SUBCUTANEOUS
  Filled 2024-01-15: qty 0.4

## 2024-01-15 MED ORDER — PRAZOSIN HCL 1 MG PO CAPS
1.0000 mg | ORAL_CAPSULE | Freq: Every day | ORAL | Status: DC
  Administered 2024-01-15 – 2024-01-25 (×11): 1 mg via ORAL
  Filled 2024-01-15 (×11): qty 1

## 2024-01-15 MED ORDER — HYDROXYUREA 500 MG PO CAPS
1500.0000 mg | ORAL_CAPSULE | Freq: Every day | ORAL | Status: DC
  Administered 2024-01-15 – 2024-01-26 (×12): 1500 mg via ORAL
  Filled 2024-01-15 (×12): qty 3

## 2024-01-15 MED ORDER — SODIUM CHLORIDE 0.9% FLUSH: 9.0000 mL | INTRAVENOUS | Status: DC | PRN

## 2024-01-15 MED ORDER — OXYCODONE HCL 5 MG PO TABS
10.0000 mg | ORAL_TABLET | Freq: Four times a day (QID) | ORAL | Status: DC | PRN
  Administered 2024-01-15 – 2024-01-26 (×30): 10 mg via ORAL
  Filled 2024-01-15 (×31): qty 2

## 2024-01-15 MED ORDER — DIPHENHYDRAMINE HCL 25 MG PO CAPS: 25.0000 mg | ORAL_CAPSULE | ORAL | Status: DC | PRN

## 2024-01-15 MED ORDER — ENOXAPARIN SODIUM 40 MG/0.4ML IJ SOSY
40.0000 mg | PREFILLED_SYRINGE | INTRAMUSCULAR | Status: DC
  Administered 2024-01-16: 40 mg via SUBCUTANEOUS
  Filled 2024-01-15 (×5): qty 0.4

## 2024-01-15 MED ORDER — SODIUM CHLORIDE 0.9 % IV BOLUS
1000.0000 mL | Freq: Once | INTRAVENOUS | Status: AC
  Administered 2024-01-15: 1000 mL via INTRAVENOUS

## 2024-01-15 MED ORDER — NALOXONE HCL 0.4 MG/ML IJ SOLN: 0.4000 mg | INTRAMUSCULAR | Status: DC | PRN

## 2024-01-15 MED ORDER — HYDROMORPHONE HCL 1 MG/ML IJ SOLN
1.0000 mg | Freq: Once | INTRAMUSCULAR | Status: AC
  Administered 2024-01-15: 1 mg via INTRAVENOUS
  Filled 2024-01-15: qty 1

## 2024-01-15 NOTE — H&P (Deleted)
   The note originally documented on this encounter has been moved the the encounter in which it belongs.

## 2024-01-15 NOTE — ED Notes (Signed)
 CARELINK  CALLED PER  DR  DAVIS  MD

## 2024-01-15 NOTE — H&P (Signed)
 H&P  Patient Demographics:  Bailey Reese, is a 18 y.o. female  MRN: [id number]   DOB - 2006/04/12  Admit Date - 01/15/2024  Outpatient Primary MD for the patient is Little, Slater Pizza, MD  Chief Complaint  Patient presents with   Sickle Cell Pain Crisis           HPI:   Bailey Reese  is a 18 y.o. female, with medical history significant of sickle cell disease, chronic pain, history of hemorrhagic stroke in 2017, asthma, PTSD, depression, anxiety, GERD presenting from [facility] ED due to concern for sickle cell pain crisis. She states that she went out with friends yesterday night and may have overdid it with physical activity.  Reports that another factor that may be contributing was that she used to be on oxycodone  for sickle cell pain but her primary care physician stopped it a few months ago and subsequently she has been hospitalized since.  Her last hospitalization was at [facility] with a discharge date of 01/01/2024.  Denies any fevers or chills, cough congestion, shortness of breath or changes in urinary bowel habits.    ED Course: Patient was treated in the ED with IVF, IV pain medication with no resolution to pain symptoms. Chest X-ray shows no no acute cardiopulmonary abnormality. Patient is admitted in-patient for unresolved sickle cell pain .  WBC 17.6 (*)       RBC 2.15 (*)      Hemoglobin 7.2 (*)      HCT 20.3 (*)      RDW 22.4 (*)      Platelets 412 (*)      nRBC 3.3 (*)      Neutro Abs 9.7 (*)      Lymphs Abs 4.6 (*)      Monocytes Absolute 2.5 (*)      Basophils Absolute 0.2 (*)      Abs Immature Granulocytes 0.22 (*)      Pulse Rate 01/15/24 0801 (!) 101     Resp 01/15/24 0801 17     Temp 01/15/24 0803 98 F (36.7 C)     Temp Source 01/15/24 0803 Oral     SpO2 01/15/24 0801 96 %     Weight 01/15/24 0802 143 lb 4.8 oz (65 kg)    Review of systems:  In addition to the HPI above, patient reports No fever or chills No Headache, No changes with vision or  hearing No problems swallowing food or liquids No chest pain, cough or shortness of breath No abdominal pain, No nausea or vomiting, Bowel movements are regular No blood in stool or urine No dysuria No new skin rashes or bruises No new joints pains-aches No new weakness, tingling, numbness in any extremity No recent weight gain or loss No polyuria, polydypsia or polyphagia No significant Mental Stressors  A full 10 point Review of Systems was done, except as stated above, all other Review of Systems were negative.  With Past History of the following :   Past Medical History:  Diagnosis Date   Anxiety    Depression    PTSD (post-traumatic stress disorder)    Sickle cell anemia (HCC)       Past Surgical History:  Procedure Laterality Date   BIOPSY  07/04/2023   Procedure: BIOPSY;  Surgeon: Saintclair Jasper, MD;  Location: [facility];  Service: Gastroenterology;;   CHOLECYSTECTOMY     ESOPHAGOGASTRODUODENOSCOPY (EGD) WITH PROPOFOL  N/A 07/04/2023   Procedure: ESOPHAGOGASTRODUODENOSCOPY (EGD) WITH PROPOFOL ;  Surgeon:  Saintclair Jasper, MD;  Location: [facility];  Service: Gastroenterology;  Laterality: N/A;   WRIST SURGERY       Social History:   Social History   Tobacco Use   Smoking status: Never   Smokeless tobacco: Never  Substance Use Topics   Alcohol  use: No     Lives - At home   Family History :   Family History  Problem Relation Age of Onset   Diabetes Paternal Aunt      Home Medications:   Prior to Admission medications   Medication Sig Start Date End Date Taking? Authorizing Provider  budesonide-formoterol  (SYMBICORT) 80-4.5 MCG/ACT inhaler Inhale 2 puffs into the lungs in the morning and at bedtime. 12/29/22 01/01/24  [provider]  buprenorphine  (BUTRANS ) 20 MCG/HR PTWK Place 1 patch onto the skin once a week.    [provider]  Calcium Carb-Cholecalciferol (CALCIUM 600+D3 PO) Take 2 tablets by mouth daily.    [provider]   Deferasirox  360 MG TABS Take 360 mg by mouth 2 (two) times daily. Patient not taking: Reported on 01/01/2024    [provider]  ergocalciferol (VITAMIN D2) 1.25 MG (50000 UT) capsule Take 50,000 Units by mouth once a week.    [provider]  esomeprazole  (NEXIUM ) 40 MG capsule Take 1 capsule (40 mg total) by mouth daily as needed (reflux). 08/29/23   Armanii Urbanik M, NP  fexofenadine (ALLEGRA) 180 MG tablet Take 180 mg by mouth daily as needed for allergies. 12/29/22   [provider]  hydroxyurea  (HYDREA ) 500 MG capsule Take 1,500 mg by mouth daily. May take with food to minimize GI side effects.    [provider]  hydrOXYzine  (ATARAX ) 25 MG tablet Take 25 mg by mouth See admin instructions. Take 25mg  (1 tablet) by mouth twice daily and an extra tablet if needed.    [provider]  lubiprostone  (AMITIZA ) 24 MCG capsule Take 24 mcg by mouth in the morning and at bedtime. Patient not taking: Reported on 01/01/2024 11/12/23   [provider]  ondansetron  (ZOFRAN ) 4 MG tablet Take 4 mg by mouth. Patient not taking: Reported on 01/01/2024 12/29/23   [provider]  Oxycodone  HCl 10 MG TABS Take 1 tablet (10 mg total) by mouth every 6 (six) hours as needed (pain). Patient not taking: Reported on 01/01/2024 07/05/23   Sim Emery CROME, MD  pantoprazole  (PROTONIX ) 40 MG tablet Take 1 tablet (40 mg total) by mouth 2 (two) times daily. Patient taking differently: Take 40 mg by mouth daily as needed (reflux). 07/05/23 07/04/24  Sim Emery CROME, MD  prazosin  (MINIPRESS ) 1 MG capsule Take 1 mg by mouth at bedtime.    [provider]  pregabalin  (LYRICA ) 150 MG capsule Take 150 mg by mouth 3 (three) times daily.    [provider]     Allergies:   Allergies  Allergen Reactions   Cherry Anaphylaxis, Itching and Swelling   Olanzapine  Other (See Comments)    Drug-induced liver injury   Other Swelling and Other (See Comments)     Pitted fruits   Peanut-Containing Drug Products Anaphylaxis, Itching and Swelling   Plum Pulp Anaphylaxis and Hives   Droperidol  Other (See Comments)    Pt states muscle tension, involuntary movements, eye drift, anxiety   Morphine  And Codeine Hives and Itching   Peach [Prunus Persica] Itching, Swelling and Other (See Comments)   Acetaminophen  Nausea And Vomiting   Compazine  [Prochlorperazine ] Other (See Comments) and Hypertension  Patient stated that this medication causes her HR to increase and well as BP. She also states that it cause hallucinations.   Ibuprofen  Nausea And Vomiting     Physical Exam:   Vitals:   Vitals:   01/15/24 1346 01/15/24 1504  BP: 110/67 116/69  Pulse: 87 90  Resp: 18 16  Temp: 97.9 F (36.6 C) 98.4 F (36.9 C)  SpO2: 98% 96%    Physical Exam: Constitutional: Patient appears well-developed and well-nourished. Not in obvious distress. HENT: Normocephalic, atraumatic, External right and left ear normal. Oropharynx is clear and moist.  Eyes: Conjunctivae and EOM are normal. PERRLA, no scleral icterus. Neck: Normal ROM. Neck supple. No JVD. No tracheal deviation. No thyromegaly. CVS: RRR, S1/S2 +, no murmurs, no gallops, no carotid bruit.  Pulmonary: Effort and breath sounds normal, no stridor, rhonchi, wheezes, rales.  Abdominal: Soft. BS +, no distension, tenderness, rebound or guarding.  Musculoskeletal:Generalize body tenderness.  Lymphadenopathy: No lymphadenopathy noted, cervical, inguinal or axillary Neuro: Alert. Normal reflexes, muscle tone coordination. No cranial nerve deficit. Skin: Skin is warm and dry. No rash noted. Not diaphoretic. No erythema. No pallor. Psychiatric: Normal mood and affect. Behavior, judgment, thought content normal.   Data Review:   CBC Recent Labs  Lab 01/15/24 0807  WBC 17.6*  HGB 7.2*  HCT 20.3*  PLT 412*  MCV 94.4  MCH 33.5  MCHC 35.5  RDW 22.4*  LYMPHSABS 4.6*  MONOABS 2.5*  EOSABS 0.5   BASOSABS 0.2*   ------------------------------------------------------------------------------------------------------------------  Chemistries  Recent Labs  Lab 01/15/24 0807  NA 139  K 3.4*  CL 106  CO2 23  GLUCOSE 102*  BUN 8  CREATININE 0.40*  CALCIUM 9.3   ------------------------------------------------------------------------------------------------------------------ estimated creatinine clearance is 97.7 mL/min (A) (by C-G formula based on SCr of 0.4 mg/dL (L)). ------------------------------------------------------------------------------------------------------------------ No results for input(s): TSH, T4TOTAL, T3FREE, THYROIDAB in the last 72 hours.  Invalid input(s): FREET3  Coagulation profile No results for input(s): INR, PROTIME in the last 168 hours. ------------------------------------------------------------------------------------------------------------------- No results for input(s): DDIMER in the last 72 hours. -------------------------------------------------------------------------------------------------------------------  Cardiac Enzymes No results for input(s): CKMB, TROPONINI, MYOGLOBIN in the last 168 hours.  Invalid input(s): CK ------------------------------------------------------------------------------------------------------------------    Component Value Date/Time   BNP 22.4 12/01/2023 2047    ---------------------------------------------------------------------------------------------------------------  Urinalysis    Component Value Date/Time   COLORURINE YELLOW (A) 12/26/2023 0939   APPEARANCEUR CLEAR (A) 12/26/2023 0939   APPEARANCEUR Clear 05/01/2013 1440   LABSPEC 1.011 12/26/2023 0939   LABSPEC 1.016 05/01/2013 1440   PHURINE 5.0 12/26/2023 0939   GLUCOSEU NEGATIVE 12/26/2023 0939   GLUCOSEU Negative 05/01/2013 1440   HGBUR NEGATIVE 12/26/2023 0939   BILIRUBINUR NEGATIVE 12/26/2023 0939    BILIRUBINUR Negative 05/01/2013 1440   KETONESUR NEGATIVE 12/26/2023 0939   PROTEINUR NEGATIVE 12/26/2023 0939   NITRITE NEGATIVE 12/26/2023 0939   LEUKOCYTESUR NEGATIVE 12/26/2023 0939   LEUKOCYTESUR Negative 05/01/2013 1440    ----------------------------------------------------------------------------------------------------------------   Imaging Results:    DG Chest 2 View Result Date: 01/15/2024 CLINICAL DATA:  18 year old female with sickle cell disease and chest pain. EXAM: CHEST - 2 VIEW COMPARISON:  CTA chest 12/11/2023. Chest radiographs 01/05/2024 and earlier. FINDINGS: AP and lateral views of the chest 0820 hours. Lower lung volumes. Stable mild cardiomegaly. Other mediastinal contours are within normal limits. Visualized tracheal air column is within normal limits. No pneumothorax, pleural effusion, pulmonary edema, confluent lung opacity. Stable lung markings. Stable visualized osseous structures. Intermittent H-shaped vertebral body deformities better demonstrated by CTA.  Negative visible bowel gas. IMPRESSION: Lower lung volumes. No acute cardiopulmonary abnormality. Sequelae of sickle cell disease. Electronically Signed   By: VEAR Hurst M.D.   On: 01/15/2024 08:39     Assessment & Plan:  Principal Problem:   Sickle cell anemia with pain (HCC) Active Problems:   Sickle cell pain crisis (HCC)   Chronic pain syndrome   Leukocytosis   GAD (generalized anxiety disorder)   Anemia of chronic disease   Hb Sickle Cell Disease with crisis: Admit patient, start IVF 0.45% Saline @ 125 mls/hour, start weight based Dilaudid  PCA, start IV Toradol  15 mg Q 6 H, Restart oral home pain medications, Monitor vitals very closely, Re-evaluate pain scale regularly, 2 L of Oxygen by [place], Patient will be re-evaluated for pain in the context of function and relationship to baseline as care progresses. Leukocytosis: Slightly elevated, no acute signs/symptoms of infection will continue to monitor daily  CBC Anemia of Chronic Disease: hgb within patients baseline. No need for transfusion. Will continue to monitor baseline Chronic pain Syndrome: Continue oral home pain management GAD: Stable, continue medication as prescribed   DVT Prophylaxis: Subcut Lovenox    AM Labs Ordered, also please review Full Orders  Family Communication: Admission, patient's condition and plan of care including tests being ordered have been discussed with the patient who indicate understanding and agree with the plan and Code Status.  Code Status: Full Code  Consults called: None    Admission status: Inpatient    Time spent in minutes : 50 minutes  [name] Cover NP 01/15/2024 at 3:10 PM

## 2024-01-15 NOTE — Plan of Care (Signed)
   Problem: Education: Goal: Knowledge of General Education information will improve Description: Including pain rating scale, medication(s)/side effects and non-pharmacologic comfort measures Outcome: Progressing   Problem: Clinical Measurements: Goal: Respiratory complications will improve Outcome: Progressing Goal: Cardiovascular complication will be avoided Outcome: Progressing   Problem: Activity: Goal: Risk for activity intolerance will decrease Outcome: Progressing   Problem: Safety: Goal: Ability to remain free from injury will improve Outcome: Progressing

## 2024-01-15 NOTE — ED Triage Notes (Signed)
 Patient to ED via [facility] from home for sickle cell crisis. Started last night. Given 75 mcg of fentanyl  by EMS.

## 2024-01-15 NOTE — ED Provider Notes (Signed)
 [facility] Provider Note    Event Date/Time   First MD Initiated Contact with Patient 01/15/24 [phone]     (approximate)   History   Sickle Cell Pain Crisis (/)   HPI  Bailey Reese is a 18 y.o. female with past medical history of sickle cell disease, chronic pain, hemorrhagic stroke, asthma, PTSD, depression, anxiety and GERD who who presents with 24 hours of worsening generalized pain and chest pain consistent with prior sickle cell crises.  Patient reports compliance with all of her medications except for her buprenorphine  patch which she took off yesterday in the shower.  She states that she went out with friends yesterday night and may have overdid it with physical activity.  Reports that another factor that may be contributing was that she used to be on oxycodone  for sickle cell pain but her primary care physician stopped it a few months ago and subsequently she has been hospitalized since.  Her last hospitalization was at [facility] with a discharge date of 01/01/2024.  Denies any fevers or chills, cough congestion, shortness of breath or changes in urinary bowel habits.  Denies the possibility of pregnancy.  She reports that her symptoms are not as intense as last time I saw her on 12/31/2023     Physical Exam   Triage Vital Signs: ED Triage Vitals  Encounter Vitals Group     BP 01/15/24 0801 (!) 140/89     Girls Systolic BP Percentile --      Girls Diastolic BP Percentile --      Boys Systolic BP Percentile --      Boys Diastolic BP Percentile --      Pulse Rate 01/15/24 0801 (!) 101     Resp 01/15/24 0801 17     Temp 01/15/24 0803 98 F (36.7 C)     Temp Source 01/15/24 0803 Oral     SpO2 01/15/24 0801 96 %     Weight 01/15/24 0802 143 lb 4.8 oz (65 kg)     Height 01/15/24 0802 5' 4 (1.626 m)     Head Circumference --      Peak Flow --      Pain Score 01/15/24 0803 9     Pain Loc --      Pain Education --      Exclude from Growth  Chart --     Most recent vital signs: Vitals:   01/15/24 1015 01/15/24 1030  BP: 113/77 113/69  Pulse: 88 86  Resp: 16 15  Temp:    SpO2: 97% 98%    Nursing Triage Note reviewed. Vital signs reviewed and patients oxygen saturation is normoxic  General: Patient is well nourished, well developed, awake and alert, resting comfortably in no acute distress Head: Normocephalic and atraumatic Eyes: Normal inspection, extraocular muscles intact, no conjunctival pallor Ear, nose, throat: Normal external exam Neck: Normal range of motion Respiratory: Patient is in no respiratory distress, lungs CTAB Cardiovascular: Patient is not tachycardic, RRR without murmur appreciated GI: Abd SNT with no guarding or rebound  Back: Normal inspection of the back with good strength and range of motion throughout all ext Extremities: pulses intact with good cap refills, no LE pitting edema or calf tenderness Neuro: The patient is alert and oriented to person, place, and time, appropriately conversive, with 5/5 bilat UE/LE strength, no gross motor or sensory defects noted. Coordination appears to be adequate. Skin: Warm, dry, and intact Psych: normal mood and affect,  no SI or HI  ED Results / Procedures / Treatments   Labs (all labs ordered are listed, but only abnormal results are displayed) Labs Reviewed  RETICULOCYTES - Abnormal; Notable for the following components:      Result Value   Retic Ct Pct 27.0 (*)    RBC. 2.85 (*)    Retic Count, Absolute 768.5 (*)    Immature Retic Fract 30.6 (*)    All other components within normal limits  CBC WITH DIFFERENTIAL/PLATELET - Abnormal; Notable for the following components:   WBC 17.6 (*)    RBC 2.15 (*)    Hemoglobin 7.2 (*)    HCT 20.3 (*)    RDW 22.4 (*)    Platelets 412 (*)    nRBC 3.3 (*)    Neutro Abs 9.7 (*)    Lymphs Abs 4.6 (*)    Monocytes Absolute 2.5 (*)    Basophils Absolute 0.2 (*)    Abs Immature Granulocytes 0.22 (*)    All  other components within normal limits  BASIC METABOLIC PANEL WITH GFR - Abnormal; Notable for the following components:   Potassium 3.4 (*)    Glucose, Bld 102 (*)    Creatinine, Ser 0.40 (*)    All other components within normal limits  LIPASE, BLOOD  HCG, QUANTITATIVE, PREGNANCY     EKG  None RADIOLOGY XR chest: Unremarkable on my independent review interpretation radiologist agrees    PROCEDURES:  Critical Care performed: No  Procedures   MEDICATIONS ORDERED IN ED: Medications  HYDROmorphone  (DILAUDID ) injection 0.5 mg (0.5 mg Intravenous Given 01/15/24 0811)  sodium chloride  0.9 % bolus 1,000 mL (0 mLs Intravenous Stopped 01/15/24 1124)  ondansetron  (ZOFRAN ) injection 4 mg (4 mg Intravenous Given 01/15/24 0810)  ketorolac  (TORADOL ) 15 MG/ML injection 15 mg (15 mg Intravenous Given 01/15/24 0810)  HYDROmorphone  (DILAUDID ) injection 0.5 mg (0.5 mg Intravenous Given 01/15/24 0904)  HYDROmorphone  (DILAUDID ) injection 1 mg (1 mg Intravenous Given 01/15/24 1015)     IMPRESSION / MDM / ASSESSMENT AND PLAN / ED COURSE                                Differential diagnosis includes, but is not limited to, sickle cell pain crisis, acute chest, pneumonia, electrolyte derangement anemia   ED course: Patient presents in generalized pain similar to prior sickle cell crises.  It does seem that her presentations are becoming more frequent according to chart review.  Multimodal pain control initiated with ketorolac  Dilaudid  and IV fluids.  She does have a slight leukocytosis however she has had this in the past.  Chest x-ray without evidence of acute chest.  She has an appropriate reticulocyte count.  Despite 3 doses of IV Dilaudid  patient endorses continued pain and wants to transfer to [facility].  Case discussed with accepting physician he states that they will work on care plan for the patient in the future.  Plan for transfer to [facility]   Clinical Course as of 01/15/24 1352  Mon Jan 15, 2024  0825 Hemoglobin(!): 7.2 Seems to be at patient's baseline which ranges from 7.9-7 [HD]  0856 DG Chest 2 View No acute abnormality [HD]  0856 Reticulocytes(!) Appropriate reticulocyte count [HD]  0900 I reevaluated the patient.  She is resting comfortably but endorses continued pain.  We reviewed her blood work which is reassuring.  She requests another dose of pain meds which I will  administer. Will continue to reassess.  [HD]  0934 WBC(!): 17.6 WBC seems more elevated.  [HD]  9062 HCG, Beta Chain, Quant, S: 1 Not pregnant [HD]  1010 Although patient is very well-appearing she is still endorsing ongoing pain.  Explained that we will can do 1 more dose of Dilaudid  and if she is still in pain we will plan on transferring reports understanding and agreement [HD]  1036 Patient reevaluated and states that she is still having ongoing pain and that she does not feel comfortable returning home.  Will discuss transfer to [facility].  [HD]  1051 RDW(!): 22.4 Patient discussed with Dr.Ijalola.  They will work on a care plan.  Patient will be transferred to [facility].  Patient in agreement [HD]  1351 [facility] here, resting comfortable, texting on cellphone [HD]    Clinical Course User Index [HD] Nicholaus Rolland BRAVO, MD   -- Risk: 5 This patient has a high risk of morbidity due to further diagnostic testing or treatment. Rationale: This patient's evaluation and management involve a high risk of morbidity due to the potential severity of presenting symptoms, need for diagnostic testing, and/or initiation of treatment that may require close monitoring. The differential includes conditions with potential for significant deterioration or requiring escalation of care. Treatment decisions in the ED, including medication administration, procedural interventions, or disposition planning, reflect this level of risk. Additional Support: -- Drug therapy requiring intensive monitoring for toxicity [ ]  --  Decision regarding elective major surgery with idenitified patient or procedure risk factors [ ]  -- Decision regarding hospitalization or escalation of hospital-level care [ ]  -- Decision not to resuscitate or to de-escalate care because of poor prognosis [ ]  -- Parental controlled substances [X dilaudid   COPA: 5 The patient has a severe exacerbation, progression, or side effect of treatment of the following illness/illnesses: []  OR  The patient has the following acute or chronic illness/injury that poses a possible threat to life or bodily function: [X] : The patient has a potentially serious acute condition or an acute exacerbation of a chronic illness requiring urgent evaluation and management in the Emergency Department. The clinical presentation necessitates immediate consideration of life-threatening or function-threatening diagnoses, even if they are ultimately ruled out.  Data(2/3 categories following were performed): 5 I reviewed or ordered at least three unique tests, external notes, and/or the history required an independent historian as one of the three requirements as following: CBC, BMP, reticulocytes AND  I independently interpreted the following test: X-ray chest OR  I discussed the management of the patient with the following external physician or qualified healthcare provider: []   Suggested E/M Coding Level: 5, 99285, This has been selected based on the 28-Jan-2022 CPT guidelines for E/M codes in the Emergency Department based on 2/3 of the CoPA, Data, and Risk.   FINAL CLINICAL IMPRESSION(S) / ED DIAGNOSES   Final diagnoses:  Sickle cell pain crisis (HCC)     Rx / DC Orders   ED Discharge Orders     None        Note:  This document was prepared using Dragon voice recognition software and may include unintentional dictation errors.   Nicholaus Rolland BRAVO, MD 01/15/24 [phone]

## 2024-01-15 NOTE — ED Notes (Signed)
 EMTALA reviewed by this RN.

## 2024-01-15 NOTE — Consult Note (Signed)
 PHARMACY - ANTICOAGULATION CONSULT NOTE  Pharmacy Consult for Enoxaparin  Indication: VTE prophylaxis  Allergies  Allergen Reactions   Cherry Anaphylaxis, Itching and Swelling   Olanzapine  Other (See Comments)    Drug-induced liver injury   Other Swelling and Other (See Comments)    Pitted fruits   Peanut-Containing Drug Products Anaphylaxis, Itching and Swelling   Plum Pulp Anaphylaxis and Hives   Droperidol  Other (See Comments)    Pt states muscle tension, involuntary movements, eye drift, anxiety   Morphine  And Codeine Hives and Itching   Peach [Prunus Persica] Itching, Swelling and Other (See Comments)   Acetaminophen  Nausea And Vomiting   Compazine  [Prochlorperazine ] Other (See Comments) and Hypertension    Patient stated that this medication causes her HR to increase and well as BP. She also states that it cause hallucinations.   Ibuprofen  Nausea And Vomiting    Patient Measurements: Height: 5' 4 (162.6 cm) Weight: 65 kg (143 lb 4.8 oz) IBW/kg (Calculated) : 54.7 HEPARIN DW (KG): 65  Vital Signs: Temp: 97.7 F (36.5 C) (09/01 1154) Temp Source: Oral (09/01 1154) BP: 112/77 (09/01 1154) Pulse Rate: 89 (09/01 1154)  Labs: Recent Labs    01/15/24 0807  HGB 7.2*  HCT 20.3*  PLT 412*  CREATININE 0.40*    Estimated Creatinine Clearance: 97.7 mL/min (A) (by C-G formula based on SCr of 0.4 mg/dL (L)).   Medical History: Past Medical History:  Diagnosis Date   Anxiety    Depression    PTSD (post-traumatic stress disorder)    Sickle cell anemia (HCC)     Medications:  (Not in a hospital admission)  Scheduled:   HYDROmorphone    Intravenous Q4H   ketorolac   15 mg Intravenous Q6H   Infusions:  PRN: diphenhydrAMINE , HYDROmorphone  (DILAUDID ) injection, naloxone  **AND** sodium chloride  flush, ondansetron  (ZOFRAN ) IV Anti-infectives (From admission, onward)    None       Assessment: Patient is a 18 yo F admitted with sickle cell crisis. PMH  significant for sickle cell disease, chronic pain, hemorrhagic stroke, asthma, PTSD, depression, anxiety and GERD. Patient's BMI is normal. Patient's Hgb is 7.2 (BL 7-9) and PLTs 412 (300-500s). Will continue to monitor.   Goal of Therapy:    Monitor platelets by anticoagulation protocol: Yes   Plan:  - Will start enoxaparin  40 mg q24h - Will monitor Hgb and PLTs    Ransom Blanch PGY-1 Pharmacy Resident  [place] [facility]  01/15/2024 12:51 PM

## 2024-01-16 LAB — CBC
HCT: 21.4 % — ABNORMAL LOW (ref 36.0–46.0)
Hemoglobin: 7.1 g/dL — ABNORMAL LOW (ref 12.0–15.0)
MCH: 32.9 pg (ref 26.0–34.0)
MCHC: 33.2 g/dL (ref 30.0–36.0)
MCV: 99.1 fL (ref 80.0–100.0)
Platelets: 439 K/uL — ABNORMAL HIGH (ref 150–400)
RBC: 2.16 MIL/uL — ABNORMAL LOW (ref 3.87–5.11)
RDW: 23.6 % — ABNORMAL HIGH (ref 11.5–15.5)
WBC: 14.6 K/uL — ABNORMAL HIGH (ref 4.0–10.5)
nRBC: 8.2 % — ABNORMAL HIGH (ref 0.0–0.2)

## 2024-01-16 NOTE — Progress Notes (Signed)
 Patient ID: Bailey Reese, female   DOB: Oct 22, 2005, 18 y.o.   MRN: [id number] Subjective: Jeanenne Licea is a 18 y.o. female, with medical history significant of sickle cell disease, chronic pain, history of hemorrhagic stroke in 2017, asthma, PTSD, depression, anxiety, GERD presenting from [facility] ED due to concern for sickle cell pain crisis.   Today patient is reporting generalized pain of 8/10. Denies fever or chills, cough, congestion, shortness of breath or changes in urinary/bowel habits.   Objective:  Vital signs in last 24 hours:  Vitals:   01/16/24 0532 01/16/24 0820 01/16/24 1026 01/16/24 1151  BP:   121/70   Pulse:   83   Resp: 20 13 15 15   Temp:   98.7 F (37.1 C)   TempSrc:   Oral   SpO2: 98%  97%   Weight:      Height:        Intake/Output from previous day:   Intake/Output Summary (Last 24 hours) at 01/16/2024 1258 Last data filed at 01/15/2024 1530 Gross per 24 hour  Intake 30 ml  Output --  Net 30 ml    Physical Exam: General: Alert, awake, oriented x3, in no acute distress.  HEENT: [place]/AT PEERL, EOMI Neck: Trachea midline,  no masses, no thyromegal,y no JVD, no carotid bruit OROPHARYNX:  Moist, No exudate/ erythema/lesions.  Heart: Regular rate and rhythm, without murmurs, rubs, gallops, PMI non-displaced, no heaves or thrills on palpation.  Lungs: Clear to auscultation, no wheezing or rhonchi noted. No increased vocal fremitus resonant to percussion  Abdomen: Soft, nontender, nondistended, positive bowel sounds, no masses no hepatosplenomegaly noted..  Neuro: No focal neurological deficits noted cranial nerves II through XII grossly intact. DTRs 2+ bilaterally upper and lower extremities. Strength 5 out of 5 in bilateral upper and lower extremities. Musculoskeletal: Generalized body tenderness  psychiatric: Patient alert and oriented x3, good insight and cognition, good recent to remote recall. Lymph node survey: No cervical axillary or inguinal  lymphadenopathy noted.  Lab Results:  Basic Metabolic Panel:    Component Value Date/Time   NA 139 01/15/2024 0807   NA 135 05/01/2013 1440   K 3.4 (L) 01/15/2024 0807   K 4.3 05/01/2013 1440   CL 106 01/15/2024 0807   CL 102 05/01/2013 1440   CO2 23 01/15/2024 0807   CO2 27 (H) 05/01/2013 1440   BUN 8 01/15/2024 0807   BUN 13 05/01/2013 1440   CREATININE 0.47 01/15/2024 1932   CREATININE 0.40 (L) 05/01/2013 1440   GLUCOSE 102 (H) 01/15/2024 0807   GLUCOSE 108 (H) 05/01/2013 1440   CALCIUM 9.3 01/15/2024 0807   CALCIUM 9.8 05/01/2013 1440   CBC:    Component Value Date/Time   WBC 14.6 (H) 01/16/2024 0520   HGB 7.1 (L) 01/16/2024 0520   HGB 6.6 (L) 05/01/2013 1440   HCT 21.4 (L) 01/16/2024 0520   HCT 20.1 (L) 05/01/2013 1440   PLT 439 (H) 01/16/2024 0520   PLT 468 (H) 05/01/2013 1440   MCV 99.1 01/16/2024 0520   MCV 89 05/01/2013 1440   NEUTROABS 9.7 (H) 01/15/2024 0807   NEUTROABS 7.9 05/30/2012 1958   LYMPHSABS 4.6 (H) 01/15/2024 0807   LYMPHSABS 5.0 05/30/2012 1958   MONOABS 2.5 (H) 01/15/2024 0807   MONOABS 1.6 (H) 05/30/2012 1958   EOSABS 0.5 01/15/2024 0807   EOSABS 0.6 05/30/2012 1958   BASOSABS 0.2 (H) 01/15/2024 0807   BASOSABS 3 04/28/2013 1736   BASOSABS 0.2 (H) 05/30/2012 1958  No results found for this or any previous visit (from the past 240 hours).  Studies/Results: DG Chest 2 View Result Date: 01/15/2024 CLINICAL DATA:  18 year old female with sickle cell disease and chest pain. EXAM: CHEST - 2 VIEW COMPARISON:  CTA chest 12/11/2023. Chest radiographs 01/05/2024 and earlier. FINDINGS: AP and lateral views of the chest 0820 hours. Lower lung volumes. Stable mild cardiomegaly. Other mediastinal contours are within normal limits. Visualized tracheal air column is within normal limits. No pneumothorax, pleural effusion, pulmonary edema, confluent lung opacity. Stable lung markings. Stable visualized osseous structures. Intermittent H-shaped vertebral  body deformities better demonstrated by CTA. Negative visible bowel gas. IMPRESSION: Lower lung volumes. No acute cardiopulmonary abnormality. Sequelae of sickle cell disease. Electronically Signed   By: VEAR Hurst M.D.   On: 01/15/2024 08:39    Medications: Scheduled Meds:  Deferasirox   360 mg Oral BID   enoxaparin  (LOVENOX ) injection  40 mg Subcutaneous Q24H   HYDROmorphone    Intravenous Q4H   hydroxyurea   1,500 mg Oral Daily   hydrOXYzine   25 mg Oral BID   pantoprazole   40 mg Oral Daily   prazosin   1 mg Oral QHS   pregabalin   150 mg Oral TID   senna-docusate  1 tablet Oral BID   Continuous Infusions: PRN Meds:.diphenhydrAMINE , hydrOXYzine , naloxone  **AND** sodium chloride  flush, ondansetron  (ZOFRAN ) IV, oxyCODONE , polyethylene glycol  Consultants: None  Procedures: None  Antibiotics: None  Assessment/Plan: Principal Problem:   Sickle cell anemia with pain (HCC) Active Problems:   Sickle cell pain crisis (HCC)   Chronic pain syndrome   Leukocytosis   GAD (generalized anxiety disorder)   Hb Sickle Cell Disease with Pain crisis: Continue IVF 0.45% Saline @ 125 mls/hour, continue weight based Dilaudid  PCA, IV Toradol  15 mg Q 6 H for a total of 5 days, continue oral home pain medications as ordered. Monitor vitals very closely, Re-evaluate pain scale regularly, 2 L of Oxygen by [place]. Patient encouraged to ambulate on the hallway today.  Leukocytosis: Slightly elevated.  Patient is afebrile, no acute signs and symptoms of infection.  Will continue to monitor daily CBC. Anemia of Chronic Disease: Hemoglobin within patient's baseline.  No need for transfusion.  Will continue to monitor daily CBC. Chronic pain Syndrome: Continue oral home pain management. Generalized anxiety disorder: Stable, continue medication as prescribed.  Code Status: Full Code Family Communication: N/A Disposition Plan: Not yet ready for discharge  [name] Cover NP   If 7PM-7AM, please contact  night-coverage.  01/16/2024, 12:58 PM  LOS: 1 day

## 2024-01-16 NOTE — Progress Notes (Signed)
   01/16/24 0838  TOC Brief Assessment  Insurance and Status Reviewed  Patient has primary care physician Yes  Home environment has been reviewed single family home  Prior level of function: independent  Prior/Current Home Services No current home services  Social Drivers of Health Review SDOH reviewed no interventions necessary  Readmission risk has been reviewed Yes  Transition of care needs no transition of care needs at this time    Signed: Heather Saltness, MSW, LCSW Clinical Social Worker Inpatient Care Management 01/16/2024 8:38 AM

## 2024-01-16 NOTE — Plan of Care (Signed)
   Problem: Clinical Measurements: Goal: Diagnostic test results will improve Outcome: Progressing

## 2024-01-16 NOTE — Plan of Care (Signed)

## 2024-01-17 LAB — CBC
HCT: 23 % — ABNORMAL LOW (ref 36.0–46.0)
Hemoglobin: 7.5 g/dL — ABNORMAL LOW (ref 12.0–15.0)
MCH: 32.8 pg (ref 26.0–34.0)
MCHC: 32.6 g/dL (ref 30.0–36.0)
MCV: 100.4 fL — ABNORMAL HIGH (ref 80.0–100.0)
Platelets: 444 K/uL — ABNORMAL HIGH (ref 150–400)
RBC: 2.29 MIL/uL — ABNORMAL LOW (ref 3.87–5.11)
RDW: 23.9 % — ABNORMAL HIGH (ref 11.5–15.5)
WBC: 11.7 K/uL — ABNORMAL HIGH (ref 4.0–10.5)
nRBC: 16 % — ABNORMAL HIGH (ref 0.0–0.2)

## 2024-01-17 NOTE — Progress Notes (Signed)
 Patient ID: Bailey Reese, female   DOB: 01/16/2006, 18 y.o.   MRN: [id number] Subjective: Bailey Reese is a 18 y.o. female, with medical history significant of sickle cell disease, chronic pain, history of hemorrhagic stroke in 2017, asthma, PTSD, depression, anxiety, GERD presenting from [place] ED due to concern for sickle cell pain crisis.   Today patient is reporting generalized pain of 6/10. Denies fever or chills, cough, congestion, shortness of breath or changes in urinary/bowel habits.   Objective:  Vital signs in last 24 hours:  Vitals:   01/17/24 0520 01/17/24 0550 01/17/24 0749 01/17/24 1040  BP:  130/74  116/72  Pulse:  73  93  Resp: 14 18 16 16   Temp:  98.8 F (37.1 C)  98.1 F (36.7 C)  TempSrc:  Oral  Oral  SpO2:  99%  98%  Weight:      Height:        Intake/Output from previous day:   Intake/Output Summary (Last 24 hours) at 01/17/2024 1216 Last data filed at 01/17/2024 0600 Gross per 24 hour  Intake 528 ml  Output --  Net 528 ml    Physical Exam: General: Alert, awake, oriented x3, in no acute distress.  HEENT: /AT [place]ER[place], EOMI Neck: Trachea midline,  no masses, no thyromegal,y no JVD, no carotid bruit OROPHARYNX:  Moist, No exudate/ erythema/lesions.  Heart: Regular rate and rhythm, without murmurs, rubs, gallops, PMI non-displaced, no heaves or thrills on palpation.  Lungs: Clear to auscultation, no wheezing or rhonchi noted. No increased vocal fremitus resonant to percussion  Abdomen: Soft, nontender, nondistended, positive bowel sounds, no masses no hepatosplenomegaly noted..  Neuro: No focal neurological deficits noted cranial nerves II through XII grossly intact. DTRs 2+ bilaterally upper and lower extremities. Strength 5 out of 5 in bilateral upper and lower extremities. Musculoskeletal: Generalized body tenderness  psychiatric: Patient alert and oriented x3, good insight and cognition, good recent to remote recall. Lymph node survey: No  cervical axillary or inguinal lymphadenopathy noted.  Lab Results:  Basic Metabolic Panel:    Component Value Date/Time   NA 139 01/15/2024 0807   NA 135 05/01/2013 1440   K 3.4 (L) 01/15/2024 0807   K 4.3 05/01/2013 1440   CL 106 01/15/2024 0807   CL 102 05/01/2013 1440   CO2 23 01/15/2024 0807   CO2 27 (H) 05/01/2013 1440   BUN 8 01/15/2024 0807   BUN 13 05/01/2013 1440   CREATININE 0.47 01/15/2024 1932   CREATININE 0.40 (L) 05/01/2013 1440   GLUCOSE 102 (H) 01/15/2024 0807   GLUCOSE 108 (H) 05/01/2013 1440   CALCIUM 9.3 01/15/2024 0807   CALCIUM 9.8 05/01/2013 1440   CBC:    Component Value Date/Time   WBC 11.7 (H) 01/17/2024 0558   HGB 7.5 (L) 01/17/2024 0558   HGB 6.6 (L) 05/01/2013 1440   HCT 23.0 (L) 01/17/2024 0558   HCT 20.1 (L) 05/01/2013 1440   PLT 444 (H) 01/17/2024 0558   PLT 468 (H) 05/01/2013 1440   MCV 100.4 (H) 01/17/2024 0558   MCV 89 05/01/2013 1440   NEUTROABS 9.7 (H) 01/15/2024 0807   NEUTROABS 7.9 05/30/2012 1958   LYMPHSABS 4.6 (H) 01/15/2024 0807   LYMPHSABS 5.0 05/30/2012 1958   MONOABS 2.5 (H) 01/15/2024 0807   MONOABS 1.6 (H) 05/30/2012 1958   EOSABS 0.5 01/15/2024 0807   EOSABS 0.6 05/30/2012 1958   BASOSABS 0.2 (H) 01/15/2024 0807   BASOSABS 3 04/28/2013 1736   BASOSABS 0.2 (H) 05/30/2012  1958    No results found for this or any previous visit (from the past 240 hours).  Studies/Results: No results found.   Medications: Scheduled Meds:  Deferasirox   360 mg Oral BID   enoxaparin  (LOVENOX ) injection  40 mg Subcutaneous Q24H   HYDROmorphone    Intravenous Q4H   hydroxyurea   1,500 mg Oral Daily   hydrOXYzine   25 mg Oral BID   pantoprazole   40 mg Oral Daily   prazosin   1 mg Oral QHS   pregabalin   150 mg Oral TID   senna-docusate  1 tablet Oral BID   Continuous Infusions: PRN Meds:.diphenhydrAMINE , hydrOXYzine , naloxone  **AND** sodium chloride  flush, ondansetron  (ZOFRAN ) IV, oxyCODONE , polyethylene  glycol  Consultants: None  Procedures: None  Antibiotics: None  Assessment/Plan: Principal Problem:   Sickle cell anemia with pain (HCC) Active Problems:   Sickle cell pain crisis (HCC)   Chronic pain syndrome   Leukocytosis   GAD (generalized anxiety disorder)   Hb Sickle Cell Disease with Pain crisis: Continue IVF 0.45% Saline @ 125 mls/hour, continue weight based Dilaudid  PCA, IV Toradol  15 mg Q 6 H for a total of 5 days, continue oral home pain medications as ordered. Monitor vitals very closely, Re-evaluate pain scale regularly, 2 L of Oxygen by [place]. Patient encouraged to ambulate on the hallway today.  Leukocytosis: Slightly elevated but improving from yesterday.  Patient is afebrile, no acute signs and symptoms of infection.  Will continue to monitor daily CBC. Anemia of Chronic Disease: Hemoglobin within patient's baseline.  No need for transfusion. Will continue to monitor daily CBC. Chronic pain Syndrome: Continue oral home pain management. Generalized anxiety disorder: Stable, continue medication as prescribed.  Code Status: Full Code Family Communication: N/A Disposition Plan: Not yet ready for discharge  Bailey Reese Cover NP   If 7PM-7AM, please contact night-coverage.  01/17/2024, 12:16 PM  LOS: 2 days

## 2024-01-17 NOTE — Plan of Care (Signed)
  Problem: Education: Goal: Knowledge of General Education information will improve Description: Including pain rating scale, medication(s)/side effects and non-pharmacologic comfort measures Outcome: Progressing   Problem: Clinical Measurements: Goal: Ability to maintain clinical measurements within normal limits will improve Outcome: Progressing Goal: Respiratory complications will improve Outcome: Progressing Goal: Cardiovascular complication will be avoided Outcome: Progressing   Problem: Nutrition: Goal: Adequate nutrition will be maintained Outcome: Progressing   Problem: Coping: Goal: Level of anxiety will decrease Outcome: Progressing   Problem: Elimination: Goal: Will not experience complications related to bowel motility Outcome: Progressing

## 2024-01-18 LAB — CBC
HCT: 24.7 % — ABNORMAL LOW (ref 36.0–46.0)
Hemoglobin: 8 g/dL — ABNORMAL LOW (ref 12.0–15.0)
MCH: 33.9 pg (ref 26.0–34.0)
MCHC: 32.4 g/dL (ref 30.0–36.0)
MCV: 104.7 fL — ABNORMAL HIGH (ref 80.0–100.0)
Platelets: 440 K/uL — ABNORMAL HIGH (ref 150–400)
RBC: 2.36 MIL/uL — ABNORMAL LOW (ref 3.87–5.11)
RDW: 25.2 % — ABNORMAL HIGH (ref 11.5–15.5)
WBC: 13.8 K/uL — ABNORMAL HIGH (ref 4.0–10.5)
nRBC: 13.9 % — ABNORMAL HIGH (ref 0.0–0.2)

## 2024-01-18 NOTE — Progress Notes (Signed)
   01/18/24 1300  Spiritual Encounters  Type of Visit Initial  Care provided to: Patient  Referral source Chaplain team  Reason for visit Routine spiritual support  OnCall Visit No   Chaplain offered support to patient who declined at this time.   [name] [facility]  [phone]

## 2024-01-18 NOTE — Plan of Care (Signed)

## 2024-01-18 NOTE — Progress Notes (Addendum)
 Patient ID: Bailey Reese, female   DOB: 12-27-2005, 18 y.o.   MRN: [id number] Subjective: Bailey Reese is a 18 y.o. female, with medical history significant of sickle cell disease, chronic pain, history of hemorrhagic stroke in 2017, asthma, PTSD, depression, anxiety, GERD presenting from [facility] ED due to concern for sickle cell pain crisis.   Today patient is reporting improved pain of 6-5/10. Denies fever or chills, cough, congestion, shortness of breath or changes in urinary/bowel habits.   Objective:  Vital signs in last 24 hours:  Vitals:   01/18/24 0432 01/18/24 0753 01/18/24 1044 01/18/24 1128  BP: 117/72  112/72   Pulse: (!) 107  (!) 104   Resp: 15 15  16   Temp: 98.1 F (36.7 C)  98.8 F (37.1 C)   TempSrc: Oral  Oral   SpO2: 98%  96%   Weight:      Height:        Intake/Output from previous day:  No intake or output data in the 24 hours ending 01/18/24 1407   Physical Exam: General: Alert, awake, oriented x3, in no acute distress.  HEENT: [place]/AT PEERL, EOMI Neck: Trachea midline,  no masses, no thyromegal,y no JVD, no carotid bruit OROPHARYNX:  Moist, No exudate/ erythema/lesions.  Heart: Regular rate and rhythm, without murmurs, rubs, gallops, PMI non-displaced, no heaves or thrills on palpation.  Lungs: Clear to auscultation, no wheezing or rhonchi noted. No increased vocal fremitus resonant to percussion  Abdomen: Soft, nontender, nondistended, positive bowel sounds, no masses no hepatosplenomegaly noted..  Neuro: No focal neurological deficits noted cranial nerves II through XII grossly intact. DTRs 2+ bilaterally upper and lower extremities. Strength 5 out of 5 in bilateral upper and lower extremities. Musculoskeletal: Generalized body tenderness  psychiatric: Patient alert and oriented x3, good insight and cognition, good recent to remote recall. Lymph node survey: No cervical axillary or inguinal lymphadenopathy noted.  Lab Results:  Basic Metabolic  Panel:    Component Value Date/Time   NA 139 01/15/2024 0807   NA 135 05/01/2013 1440   K 3.4 (L) 01/15/2024 0807   K 4.3 05/01/2013 1440   CL 106 01/15/2024 0807   CL 102 05/01/2013 1440   CO2 23 01/15/2024 0807   CO2 27 (H) 05/01/2013 1440   BUN 8 01/15/2024 0807   BUN 13 05/01/2013 1440   CREATININE 0.47 01/15/2024 1932   CREATININE 0.40 (L) 05/01/2013 1440   GLUCOSE 102 (H) 01/15/2024 0807   GLUCOSE 108 (H) 05/01/2013 1440   CALCIUM 9.3 01/15/2024 0807   CALCIUM 9.8 05/01/2013 1440   CBC:    Component Value Date/Time   WBC 13.8 (H) 01/18/2024 0604   HGB 8.0 (L) 01/18/2024 0604   HGB 6.6 (L) 05/01/2013 1440   HCT 24.7 (L) 01/18/2024 0604   HCT 20.1 (L) 05/01/2013 1440   PLT 440 (H) 01/18/2024 0604   PLT 468 (H) 05/01/2013 1440   MCV 104.7 (H) 01/18/2024 0604   MCV 89 05/01/2013 1440   NEUTROABS 9.7 (H) 01/15/2024 0807   NEUTROABS 7.9 05/30/2012 1958   LYMPHSABS 4.6 (H) 01/15/2024 0807   LYMPHSABS 5.0 05/30/2012 1958   MONOABS 2.5 (H) 01/15/2024 0807   MONOABS 1.6 (H) 05/30/2012 1958   EOSABS 0.5 01/15/2024 0807   EOSABS 0.6 05/30/2012 1958   BASOSABS 0.2 (H) 01/15/2024 0807   BASOSABS 3 04/28/2013 1736   BASOSABS 0.2 (H) 05/30/2012 1958    No results found for this or any previous visit (from the past 240  hours).  Studies/Results: No results found.   Medications: Scheduled Meds:  Deferasirox   360 mg Oral BID   enoxaparin  (LOVENOX ) injection  40 mg Subcutaneous Q24H   HYDROmorphone    Intravenous Q4H   hydroxyurea   1,500 mg Oral Daily   hydrOXYzine   25 mg Oral BID   pantoprazole   40 mg Oral Daily   prazosin   1 mg Oral QHS   pregabalin   150 mg Oral TID   senna-docusate  1 tablet Oral BID   Continuous Infusions: PRN Meds:.diphenhydrAMINE , hydrOXYzine , naloxone  **AND** sodium chloride  flush, ondansetron  (ZOFRAN ) IV, oxyCODONE , polyethylene glycol  Consultants: None  Procedures: None  Antibiotics: None  Assessment/Plan: Principal Problem:    Sickle cell anemia with pain (HCC) Active Problems:   Sickle cell pain crisis (HCC)   Chronic pain syndrome   Leukocytosis   GAD (generalized anxiety disorder)   Hb Sickle Cell Disease with Pain crisis: Continue IVF 0.45% Saline @ KVO, continue weight based Dilaudid  PCA, IV Toradol  15 mg Q 6 H for a total of 5 days, continue oral home pain medications as ordered. Monitor vitals very closely, Re-evaluate pain scale regularly, 2 L of Oxygen by [place]. Patient encouraged to ambulate on the hallway today.  Leukocytosis: Slightly elevated.  Patient is afebrile, no acute signs and symptoms of infection.  Will continue to monitor daily CBC. Anemia of Chronic Disease: Hemoglobin within patient's baseline.  No need for transfusion. Will continue to monitor daily CBC. Chronic pain Syndrome: Continue oral home pain management. Generalized anxiety disorder: Stable, continue medication as prescribed.  Code Status: Full Code Family Communication: N/A Disposition Plan: Not yet ready for discharge  [name] Cover NP   If 7PM-7AM, please contact night-coverage.  01/18/2024, 2:07 PM  LOS: 3 days

## 2024-01-18 NOTE — Plan of Care (Signed)
   Problem: Clinical Measurements: Goal: Ability to maintain clinical measurements within normal limits will improve Outcome: Progressing

## 2024-01-19 NOTE — Progress Notes (Signed)
 Patient ID: Bailey Reese, female   DOB: Aug 09, 2005, 18 y.o.   MRN: [id number] Subjective: Bailey Reese is a 18 y.o. female, with medical history significant of sickle cell disease, chronic pain, history of hemorrhagic stroke in 2017, asthma, PTSD, depression, anxiety, GERD presenting from [facility] ED due to concern for sickle cell pain crisis.   Today patient is reporting improved pain of 6/10. Denies fever or chills, cough, congestion, shortness of breath or changes in urinary/bowel habits.   Objective:  Vital signs in last 24 hours:  Vitals:   01/19/24 1013 01/19/24 1101 01/19/24 1433 01/19/24 1527  BP: 131/76  122/66   Pulse: 78  72   Resp: 16 13 17 13   Temp: 98.1 F (36.7 C)  99.2 F (37.3 C)   TempSrc: Oral  Oral   SpO2: 98%  97%   Weight:      Height:        Intake/Output from previous day:   Intake/Output Summary (Last 24 hours) at 01/19/2024 1648 Last data filed at 01/19/2024 1300 Gross per 24 hour  Intake 480 ml  Output --  Net 480 ml     Physical Exam: General: Alert, awake, oriented x3, in no acute distress.  HEENT: [place]/AT PEERL, EOMI Neck: Trachea midline,  no masses, no thyromegal,y no JVD, no carotid bruit OROPHARYNX:  Moist, No exudate/ erythema/lesions.  Heart: Regular rate and rhythm, without murmurs, rubs, gallops, PMI non-displaced, no heaves or thrills on palpation.  Lungs: Clear to auscultation, no wheezing or rhonchi noted. No increased vocal fremitus resonant to percussion  Abdomen: Soft, nontender, nondistended, positive bowel sounds, no masses no hepatosplenomegaly noted..  Neuro: No focal neurological deficits noted cranial nerves II through XII grossly intact. DTRs 2+ bilaterally upper and lower extremities. Strength 5 out of 5 in bilateral upper and lower extremities. Musculoskeletal: Generalized body tenderness  psychiatric: Patient alert and oriented x3, good insight and cognition, good recent to remote recall. Lymph node survey: No cervical  axillary or inguinal lymphadenopathy noted.  Lab Results:  Basic Metabolic Panel:    Component Value Date/Time   NA 139 01/15/2024 0807   NA 135 05/01/2013 1440   K 3.4 (L) 01/15/2024 0807   K 4.3 05/01/2013 1440   CL 106 01/15/2024 0807   CL 102 05/01/2013 1440   CO2 23 01/15/2024 0807   CO2 27 (H) 05/01/2013 1440   BUN 8 01/15/2024 0807   BUN 13 05/01/2013 1440   CREATININE 0.47 01/15/2024 1932   CREATININE 0.40 (L) 05/01/2013 1440   GLUCOSE 102 (H) 01/15/2024 0807   GLUCOSE 108 (H) 05/01/2013 1440   CALCIUM 9.3 01/15/2024 0807   CALCIUM 9.8 05/01/2013 1440   CBC:    Component Value Date/Time   WBC 13.8 (H) 01/18/2024 0604   HGB 8.0 (L) 01/18/2024 0604   HGB 6.6 (L) 05/01/2013 1440   HCT 24.7 (L) 01/18/2024 0604   HCT 20.1 (L) 05/01/2013 1440   PLT 440 (H) 01/18/2024 0604   PLT 468 (H) 05/01/2013 1440   MCV 104.7 (H) 01/18/2024 0604   MCV 89 05/01/2013 1440   NEUTROABS 9.7 (H) 01/15/2024 0807   NEUTROABS 7.9 05/30/2012 1958   LYMPHSABS 4.6 (H) 01/15/2024 0807   LYMPHSABS 5.0 05/30/2012 1958   MONOABS 2.5 (H) 01/15/2024 0807   MONOABS 1.6 (H) 05/30/2012 1958   EOSABS 0.5 01/15/2024 0807   EOSABS 0.6 05/30/2012 1958   BASOSABS 0.2 (H) 01/15/2024 0807   BASOSABS 3 04/28/2013 1736   BASOSABS 0.2 (H)  05/30/2012 1958    No results found for this or any previous visit (from the past 240 hours).  Studies/Results: No results found.   Medications: Scheduled Meds:  Deferasirox   360 mg Oral BID   enoxaparin  (LOVENOX ) injection  40 mg Subcutaneous Q24H   HYDROmorphone    Intravenous Q4H   hydroxyurea   1,500 mg Oral Daily   hydrOXYzine   25 mg Oral BID   pantoprazole   40 mg Oral Daily   prazosin   1 mg Oral QHS   pregabalin   150 mg Oral TID   senna-docusate  1 tablet Oral BID   Continuous Infusions: PRN Meds:.diphenhydrAMINE , hydrOXYzine , naloxone  **AND** sodium chloride  flush, ondansetron  (ZOFRAN ) IV, oxyCODONE , polyethylene  glycol  Consultants: None  Procedures: None  Antibiotics: None  Assessment/Plan: Principal Problem:   Sickle cell anemia with pain (HCC) Active Problems:   Sickle cell pain crisis (HCC)   Chronic pain syndrome   Leukocytosis   GAD (generalized anxiety disorder)   Hb Sickle Cell Disease with Pain crisis: Continue IVF 0.45% Saline @ KVO, continue weight based Dilaudid  PCA, IV Toradol  15 mg Q 6 H for a total of 5 days, continue oral home pain medications as ordered. Monitor vitals very closely, Re-evaluate pain scale regularly, 2 L of Oxygen by . Patient encouraged to ambulate on the hallway today.  Leukocytosis: Slightly elevated.  Patient is afebrile, no acute signs and symptoms of infection. Will continue to monitor daily CBC. Anemia of Chronic Disease: Hemoglobin within patient's baseline.  No need for transfusion. Will continue to monitor daily CBC. Chronic pain Syndrome: Continue oral home pain management. Generalized anxiety disorder: Stable, continue medication as prescribed.  Code Status: Full Code Family Communication: N/A Disposition Plan: Not yet ready for discharge  Bailey Reese Cover NP   If 7PM-7AM, please contact night-coverage.  01/19/2024, 4:48 PM  LOS: 4 days

## 2024-01-19 NOTE — Plan of Care (Signed)

## 2024-01-19 NOTE — Plan of Care (Signed)
  Problem: Education: Goal: Knowledge of General Education information will improve Description: Including pain rating scale, medication(s)/side effects and non-pharmacologic comfort measures Outcome: Progressing   Problem: Health Behavior/Discharge Planning: Goal: Ability to manage health-related needs will improve Outcome: Progressing   Problem: Clinical Measurements: Goal: Ability to maintain clinical measurements within normal limits will improve Outcome: Progressing Goal: Will remain free from infection Outcome: Progressing Goal: Respiratory complications will improve Outcome: Progressing Goal: Cardiovascular complication will be avoided Outcome: Progressing   Problem: Clinical Measurements: Goal: Diagnostic test results will improve Outcome: Not Progressing

## 2024-01-20 DIAGNOSIS — D5700 Hb-SS disease with crisis, unspecified: Secondary | ICD-10-CM | POA: Diagnosis not present

## 2024-01-20 LAB — COMPREHENSIVE METABOLIC PANEL WITH GFR
ALT: 109 U/L — ABNORMAL HIGH (ref 0–44)
AST: 94 U/L — ABNORMAL HIGH (ref 15–41)
Albumin: 4.4 g/dL (ref 3.5–5.0)
Alkaline Phosphatase: 168 U/L — ABNORMAL HIGH (ref 38–126)
Anion gap: 12 (ref 5–15)
BUN: 10 mg/dL (ref 6–20)
CO2: 23 mmol/L (ref 22–32)
Calcium: 9.8 mg/dL (ref 8.9–10.3)
Chloride: 101 mmol/L (ref 98–111)
Creatinine, Ser: 0.61 mg/dL (ref 0.44–1.00)
GFR, Estimated: >60 mL/min (ref >60)
Glucose, Bld: 94 mg/dL (ref 70–99)
Potassium: 4.6 mmol/L (ref 3.5–5.1)
Sodium: 137 mmol/L (ref 135–145)
Total Bilirubin: 7.9 mg/dL — ABNORMAL HIGH (ref 0.0–1.2)
Total Protein: 8 g/dL (ref 6.5–8.1)

## 2024-01-20 LAB — CBC WITH DIFFERENTIAL/PLATELET
Abs Immature Granulocytes: 0.08 K/uL — ABNORMAL HIGH (ref 0.00–0.07)
Basophils Absolute: 0.1 K/uL (ref 0.0–0.1)
Basophils Relative: 1 %
Eosinophils Absolute: 0.4 K/uL (ref 0.0–0.5)
Eosinophils Relative: 3 %
HCT: 24.4 % — ABNORMAL LOW (ref 36.0–46.0)
Hemoglobin: 8 g/dL — ABNORMAL LOW (ref 12.0–15.0)
Immature Granulocytes: 1 %
Lymphocytes Relative: 15 %
Lymphs Abs: 2.1 K/uL (ref 0.7–4.0)
MCH: 33.2 pg (ref 26.0–34.0)
MCHC: 32.8 g/dL (ref 30.0–36.0)
MCV: 101.2 fL — ABNORMAL HIGH (ref 80.0–100.0)
Monocytes Absolute: 1.8 K/uL — ABNORMAL HIGH (ref 0.1–1.0)
Monocytes Relative: 13 %
Neutro Abs: 9.6 K/uL — ABNORMAL HIGH (ref 1.7–7.7)
Neutrophils Relative %: 67 %
Platelets: 450 K/uL — ABNORMAL HIGH (ref 150–400)
RBC: 2.41 MIL/uL — ABNORMAL LOW (ref 3.87–5.11)
RDW: 24.5 % — ABNORMAL HIGH (ref 11.5–15.5)
Smear Review: NORMAL
WBC: 14 K/uL — ABNORMAL HIGH (ref 4.0–10.5)
nRBC: 7.1 % — ABNORMAL HIGH (ref 0.0–0.2)

## 2024-01-20 MED ORDER — SODIUM CHLORIDE 0.9 % IV BOLUS
1000.0000 mL | Freq: Once | INTRAVENOUS | Status: AC
  Administered 2024-01-20: 1000 mL via INTRAVENOUS

## 2024-01-20 MED ORDER — SODIUM CHLORIDE 0.9 % IV BOLUS: 500.0000 mL | Freq: Once | INTRAVENOUS | Status: DC

## 2024-01-20 MED ORDER — ACETAMINOPHEN 500 MG PO TABS
1000.0000 mg | ORAL_TABLET | Freq: Once | ORAL | Status: AC
Start: 2024-01-21 — End: 2024-01-20
  Administered 2024-01-20: 1000 mg via ORAL
  Filled 2024-01-20: qty 2

## 2024-01-20 MED ORDER — KETOROLAC TROMETHAMINE 15 MG/ML IJ SOLN
15.0000 mg | Freq: Once | INTRAMUSCULAR | Status: AC
  Administered 2024-01-20: 15 mg via INTRAVENOUS
  Filled 2024-01-20: qty 1

## 2024-01-20 NOTE — Plan of Care (Signed)

## 2024-01-20 NOTE — Progress Notes (Signed)
   01/20/24 2052  Assess: MEWS Score  Temp (!) 100.4 F (38 C)  BP (!) 125/91  MAP (mmHg) 102  Pulse Rate (!) 135  Resp 18  SpO2 97 %  O2 Device Room Air  Assess: MEWS Score  MEWS Temp 0  MEWS Systolic 0  MEWS Pulse 3  MEWS RR 0  MEWS LOC 0  MEWS Score 3  MEWS Score Color Yellow  Assess: if the MEWS score is Yellow or Red  Were vital signs accurate and taken at a resting state? Yes  Does the patient meet 2 or more of the SIRS criteria? No  MEWS guidelines implemented  Yes, yellow  Treat  MEWS Interventions Considered administering scheduled or prn medications/treatments as ordered  Take Vital Signs  Increase Vital Sign Frequency  Yellow: Q2hr x1, continue Q4hrs until patient remains green for 12hrs  Escalate  MEWS: Escalate Yellow: Discuss with charge nurse and consider notifying provider and/or RRT  Notify: Charge Nurse/RN  Name of Charge Nurse/RN Notified Coats, RN  Provider Notification  Provider Name/Title Lavanda Horns, NP  Date Provider Notified 01/20/24  Time Provider Notified 2113  Method of Notification Page  Notification Reason Change in status (Yellow MEWS)  Provider response See new orders (Toradol  and Zofran )  Date of Provider Response 01/20/24  Time of Provider Response 2118  Assess: SIRS CRITERIA  SIRS Temperature  0  SIRS Respirations  0  SIRS Pulse 1  SIRS WBC 0  SIRS Score Sum  1

## 2024-01-20 NOTE — Progress Notes (Signed)
   01/20/24 2250  Assess: MEWS Score  Temp (!) 100.9 F (38.3 C)  Pulse Rate (!) 130  Assess: MEWS Score  MEWS Temp 1  MEWS Systolic 0  MEWS Pulse 3  MEWS RR 0  MEWS LOC 0  MEWS Score 4  MEWS Score Color Red  Assess: if the MEWS score is Yellow or Red  Were vital signs accurate and taken at a resting state? Yes  Does the patient meet 2 or more of the SIRS criteria? No  MEWS guidelines implemented  Yes, red  Treat  MEWS Interventions Considered administering scheduled or prn medications/treatments as ordered  Take Vital Signs  Increase Vital Sign Frequency  Red: Q1hr x2, continue Q4hrs until patient remains green for 12hrs  Escalate  MEWS: Escalate Red: Discuss with charge nurse and notify provider. Consider notifying RRT. If remains red for 2 hours consider need for higher level of care  Notify: Charge Nurse/RN  Name of Charge Nurse/RN Notified Blanca, RN  Provider Notification  Provider Name/Title Lavanda Horns, NP  Date Provider Notified 01/20/24  Time Provider Notified 2258  Method of Notification Page  Notification Reason Change in status  Provider response No new orders  Date of Provider Response 01/20/24  Time of Provider Response 2300  Assess: SIRS CRITERIA  SIRS Temperature  0  SIRS Respirations  0  SIRS Pulse 1  SIRS WBC 1  SIRS Score Sum  2

## 2024-01-20 NOTE — Progress Notes (Signed)
 SICKLE CELL SERVICE PROGRESS NOTE  Bailey Reese FMW:[id number] DOB: Bailey Reese DOA: 01/15/2024 PCP: Bailey Slater Pizza, MD  Assessment/Plan: Principal Problem:   Sickle cell anemia with pain (HCC) Active Problems:   Sickle cell pain crisis (HCC)   Chronic pain syndrome   Leukocytosis   GAD (generalized anxiety disorder)  Sickle cell pain crisis: Patient is still complaining of pain despite being on Dilaudid  PCA, Toradol  and oral medications.  She has had chronic pain and difficult to get her pain control.  She says she was feeling better but now has recurrence.  Encouraged to continue using the Dilaudid  PCA.  Adjust her oral medications Anemia of chronic disease: Continue monitoring H&H. Chronic pain syndrome: Continue home regimen PTSD with anxiety: Continue antianxiety medications. GERD: Continue PPIs  Code Status: Full code Family Communication: No family at bedside Disposition Plan: Home  [facility]  Pager [phone]. If 7PM-7AM, please contact night-coverage.  01/20/2024, 5:49 PM  LOS: 5 days   Brief narrative: Bailey Reese is a 18 y.o. female, with medical history significant of sickle cell disease, chronic pain, history of hemorrhagic stroke in 2017, asthma, PTSD, depression, anxiety, GERD presenting from [facility] ED due to concern for sickle cell pain crisis.   Consultants: None  Procedures: Chest x-ray  Antibiotics: None  HPI/Subjective: Patient is complaining of pain at 9 out of 10 today.  She has progressively been complaining of the same level of pain.  Objective: Vitals:   01/20/24 0943 01/20/24 1220 01/20/24 1328 01/20/24 1727  BP: 111/65  119/69   Pulse: (!) 105  (!) 106   Resp: 18 16 18 20   Temp: 98.5 F (36.9 C)  99.6 F (37.6 C)   TempSrc: Oral  Oral   SpO2: 97%  99%   Weight:      Height:       Weight change:  No intake or output data in the 24 hours ending 01/20/24 1749  General: Alert, awake, oriented x3, in no acute distress.   HEENT: [place]/AT PEERL, EOMI Neck: Trachea midline,  no masses, no thyromegal,y no JVD, no carotid bruit OROPHARYNX:  Moist, No exudate/ erythema/lesions.  Heart: Regular rate and rhythm, without murmurs, rubs, gallops, PMI non-displaced, no heaves or thrills on palpation.  Lungs: Clear to auscultation, no wheezing or rhonchi noted. No increased vocal fremitus resonant to percussion  Abdomen: Soft, nontender, nondistended, positive bowel sounds, no masses no hepatosplenomegaly noted..  Neuro: No focal neurological deficits noted cranial nerves II through XII grossly intact. DTRs 2+ bilaterally upper and lower extremities. Strength 5 out of 5 in bilateral upper and lower extremities. Musculoskeletal: No warm swelling or erythema around joints, no spinal tenderness noted. Psychiatric: Patient alert and oriented x3, good insight and cognition, good recent to remote recall. Lymph node survey: No cervical axillary or inguinal lymphadenopathy noted.   Data Reviewed: Basic Metabolic Panel: Recent Labs  Lab 01/15/24 0807 01/15/24 1932 01/20/24 1349  NA 139  --  137  K 3.4*  --  4.6  CL 106  --  101  CO2 23  --  23  GLUCOSE 102*  --  94  BUN 8  --  10  CREATININE 0.40* 0.47 0.61  CALCIUM 9.3  --  9.8   Liver Function Tests: Recent Labs  Lab 01/20/24 1349  AST 94*  ALT 109*  ALKPHOS 168*  BILITOT 7.9*  PROT 8.0  ALBUMIN 4.4   Recent Labs  Lab 01/15/24 0807  LIPASE 28   No results for  input(s): AMMONIA in the last 168 hours. CBC: Recent Labs  Lab 01/15/24 0807 01/15/24 1609 01/16/24 0520 01/17/24 0558 01/18/24 0604 01/20/24 1349  WBC 17.6* 17.1* 14.6* 11.7* 13.8* 14.0*  NEUTROABS 9.7*  --   --   --   --  9.6*  HGB 7.2* 8.1* 7.1* 7.5* 8.0* 8.0*  HCT 20.3* 23.1* 21.4* 23.0* 24.7* 24.4*  MCV 94.4 95.9 99.1 100.4* 104.7* 101.2*  PLT 412* 409* 439* 444* 440* 450*   Cardiac Enzymes: No results for input(s): CKTOTAL, CKMB, CKMBINDEX, TROPONINI in the last 168  hours. BNP (last 3 results) Recent Labs    12/01/23 2047  BNP 22.4    ProBNP (last 3 results) No results for input(s): PROBNP in the last 8760 hours.  CBG: No results for input(s): GLUCAP in the last 168 hours.  No results found for this or any previous visit (from the past 240 hours).   Studies: DG Chest 2 View Result Date: 01/15/2024 CLINICAL DATA:  18 year old female with sickle cell disease and chest pain. EXAM: CHEST - 2 VIEW COMPARISON:  CTA chest 12/11/2023. Chest radiographs 01/05/2024 and earlier. FINDINGS: AP and lateral views of the chest 0820 hours. Lower lung volumes. Stable mild cardiomegaly. Other mediastinal contours are within normal limits. Visualized tracheal air column is within normal limits. No pneumothorax, pleural effusion, pulmonary edema, confluent lung opacity. Stable lung markings. Stable visualized osseous structures. Intermittent H-shaped vertebral body deformities better demonstrated by CTA. Negative visible bowel gas. IMPRESSION: Lower lung volumes. No acute cardiopulmonary abnormality. Sequelae of sickle cell disease. Electronically Signed   By: Bailey Reese M.D.   On: 01/15/2024 08:39   DG Chest 2 View Result Date: 01/05/2024 CLINICAL DATA:  Shortness of breath. EXAM: CHEST - 2 VIEW COMPARISON:  None Available. FINDINGS: The heart size and mediastinal contours are within normal limits. Both lungs are clear. The visualized skeletal structures are unremarkable. IMPRESSION: No active cardiopulmonary disease. Electronically Signed   By: Bailey Reese M.D.   On: 01/05/2024 08:06   MR BRAIN W WO CONTRAST Result Date: 01/02/2024 EXAM: MRI BRAIN WITH AND WITHOUT CONTRAST 01/02/2024 03:45:45 PM TECHNIQUE: Multiplanar multisequence MRI of the head/brain was performed with and without the administration of intravenous contrast. 7mL gadavist  (gadobutrol  1 mmol/mL). COMPARISON: CT head 12/31/2023. CLINICAL HISTORY: Seizure, new-onset, no history of trauma. FINDINGS: BRAIN  AND VENTRICLES: No acute infarct. No acute intracranial hemorrhage. No mass effect or midline shift. The ventricles are normal in size. The cerebellar tonsils extend 18 mm below the foramen magnum and are compressed. The CSF spaces at the foramen magnum are effaced. No brain sagging. The brain is normal in signal, and no abnormal enhancement is identified . Dedicated thin section imaging through the temporal lobes demonstrates normal volume and signal of the hippocampi. There is no evidence of heterotopia or cortical dysplasia. ORBITS: No acute abnormality. SINUSES: No acute abnormality. BONES AND SOFT TISSUES: Diffusely diminished bone marrow T1 signal intensity, nonspecific but compatible with the history of sickle cell disease. IMPRESSION: 1. Chiari I malformation. 2. Otherwise negative brain MRI. Electronically signed by: Dasie Hamburg MD 01/02/2024 04:29 PM EDT RP Workstation: [id number]   EEG adult Result Date: 01/01/2024 Shelton Arlin KIDD, MD     01/01/2024  8:44 PM Patient Name: CRISTA NUON MRN: [id number] Epilepsy Attending: Arlin KIDD Shelton Referring Physician/Provider: Alfornia Madison, MD Date: 01/01/2024 Duration: 27.29 mins Patient history: 18 y.o. female with possible seizure-like activity. EEG to evaluate for seizure. Level of alertness: Awake AEDs during EEG  study: None Technical aspects: This EEG study was done with scalp electrodes positioned according to the 10-20 International system of electrode placement. Electrical activity was reviewed with band pass filter of 1-70Hz , sensitivity of 7 uV/mm, display speed of 23mm/sec with a 60Hz  notched filter applied as appropriate. EEG data were recorded continuously and digitally stored.  Video monitoring was available and reviewed as appropriate. Description: Per EEG tech, could not get all electrodes under 10 ohms due to condition of pts scalp. O1 P3 Pz P4 C3 and Cz will not reach scalp despite multiple attempts. Hyperventilation and photic  stimulation were not performed.   IMPRESSION: This study was not interpretable due to significant electrode artifact. Arlin MALVA Krebs   DG Chest 2 View Result Date: 12/31/2023 EXAM: 2 VIEW(S) XRAY OF THE CHEST 12/31/2023 10:02:00 PM COMPARISON: None available. CLINICAL HISTORY: Evaluate for acute chest, sickle cell. Sickle cell. FINDINGS: LUNGS AND PLEURA: No focal pulmonary opacity. No pulmonary edema. No pleural effusion. No pneumothorax. HEART AND MEDIASTINUM: No acute abnormality of the cardiac and mediastinal silhouettes. BONES AND SOFT TISSUES: No acute osseous abnormality. IMPRESSION: 1. No acute process. Electronically signed by: Franky Stanford MD 12/31/2023 10:12 PM EDT RP Workstation: [id number]   CT Head Wo Contrast Result Date: 12/31/2023 EXAM: CT HEAD WITHOUT CONTRAST 12/31/2023 10:07:18 PM TECHNIQUE: CT of the head was performed without the administration of intravenous contrast. Automated exposure control, iterative reconstruction, and/or weight based adjustment of the mA/kV was utilized to reduce the radiation dose to as low as reasonably achievable. COMPARISON: 03/02/2017 CLINICAL HISTORY: Seizure, sickle cell, history of intracerebral hemorrhage (ICH). Patient brought in by EMS for seizure. Patient was at church and not acting normal. Patient was described to be having a seizure by mother. The patient has a history of seizures, sickle cell, and aneurysms causing strokes per EMS. Patient crying and unable to console at triage. FINDINGS: BRAIN AND VENTRICLES: No acute hemorrhage. Gray-white differentiation is preserved. No hydrocephalus. No extra-axial collection. No mass effect or midline shift. ORBITS: No acute abnormality. SINUSES: No acute abnormality. SOFT TISSUES AND SKULL: No acute soft tissue abnormality. No skull fracture. Cerebellar tonsils extend 12 mm below the foramen magnum consistent with Chiari malformation. IMPRESSION: 1. No acute intracranial abnormality. 2. Chiari I  malformation with cerebellar tonsils extending 12 mm below the foramen magnum. Electronically signed by: Franky Stanford MD 12/31/2023 10:11 PM EDT RP Workstation: [id number]   DG Chest 2 View Result Date: 12/26/2023 EXAM: 2 VIEW(S) XRAY OF THE CHEST 12/26/2023 94:71:71 AM COMPARISON: 07/13/2023 CLINICAL HISTORY: ?acute chest - whole body SSD pain, but with cough and chest pain too. PER ER NOTE; Pt to ED via EMS from home, pt reports pain all over body that is throbbing, pt has hx sickle cell. Pt reports she was seen here for same 2 weeks ago. FINDINGS: LUNGS AND PLEURA: No focal pulmonary opacity. No pulmonary edema. No pleural effusion. No pneumothorax. HEART AND MEDIASTINUM: No acute abnormality of the cardiac and mediastinal silhouettes. BONES AND SOFT TISSUES: No acute osseous abnormality. IMPRESSION: 1. No acute process. Electronically signed by: Waddell Calk MD 12/26/2023 06:56 AM EDT RP Workstation: [id number]    Scheduled Meds:  Deferasirox   360 mg Oral BID   enoxaparin  (LOVENOX ) injection  40 mg Subcutaneous Q24H   HYDROmorphone    Intravenous Q4H   hydroxyurea   1,500 mg Oral Daily   hydrOXYzine   25 mg Oral BID   pantoprazole   40 mg Oral Daily   prazosin   1 mg Oral QHS  pregabalin   150 mg Oral TID   senna-docusate  1 tablet Oral BID   Continuous Infusions:  Principal Problem:   Sickle cell anemia with pain (HCC) Active Problems:   Sickle cell pain crisis (HCC)   Chronic pain syndrome   Leukocytosis   GAD (generalized anxiety disorder)

## 2024-01-21 ENCOUNTER — Inpatient Hospital Stay (HOSPITAL_COMMUNITY)

## 2024-01-21 DIAGNOSIS — D5700 Hb-SS disease with crisis, unspecified: Secondary | ICD-10-CM | POA: Diagnosis not present

## 2024-01-21 LAB — BLOOD CULTURE ID PANEL (REFLEXED) - BCID2

## 2024-01-21 LAB — CBC
HCT: 21.8 % — ABNORMAL LOW (ref 36.0–46.0)
Hemoglobin: 7.3 g/dL — ABNORMAL LOW (ref 12.0–15.0)
MCH: 35.1 pg — ABNORMAL HIGH (ref 26.0–34.0)
MCHC: 33.5 g/dL (ref 30.0–36.0)
MCV: 104.8 fL — ABNORMAL HIGH (ref 80.0–100.0)
Platelets: 375 K/uL (ref 150–400)
RBC: 2.08 MIL/uL — ABNORMAL LOW (ref 3.87–5.11)
RDW: 24.1 % — ABNORMAL HIGH (ref 11.5–15.5)
WBC: 15.6 K/uL — ABNORMAL HIGH (ref 4.0–10.5)
nRBC: 4.6 % — ABNORMAL HIGH (ref 0.0–0.2)

## 2024-01-21 LAB — URINALYSIS, ROUTINE W REFLEX MICROSCOPIC
Bilirubin Urine: NEGATIVE
Glucose, UA: NEGATIVE mg/dL
Hgb urine dipstick: NEGATIVE
Ketones, ur: NEGATIVE mg/dL
Leukocytes,Ua: NEGATIVE
Nitrite: NEGATIVE
Protein, ur: NEGATIVE mg/dL
Specific Gravity, Urine: 1.011 (ref 1.005–1.030)
pH: 5 (ref 5.0–8.0)

## 2024-01-21 LAB — RESPIRATORY PANEL BY PCR

## 2024-01-21 LAB — LACTIC ACID, PLASMA
Lactic Acid, Venous: 1 mmol/L (ref 0.5–1.9)
Lactic Acid, Venous: 1.2 mmol/L (ref 0.5–1.9)

## 2024-01-21 MED ORDER — VANCOMYCIN HCL IN DEXTROSE 1-5 GM/200ML-% IV SOLN
1000.0000 mg | Freq: Two times a day (BID) | INTRAVENOUS | Status: DC
  Administered 2024-01-22: 1000 mg via INTRAVENOUS
  Filled 2024-01-21: qty 200

## 2024-01-21 MED ORDER — ACETAMINOPHEN 325 MG PO TABS
650.0000 mg | ORAL_TABLET | Freq: Four times a day (QID) | ORAL | Status: DC | PRN
  Administered 2024-01-21 – 2024-01-23 (×4): 650 mg via ORAL
  Filled 2024-01-21 (×4): qty 2

## 2024-01-21 MED ORDER — KETOROLAC TROMETHAMINE 15 MG/ML IJ SOLN
15.0000 mg | Freq: Once | INTRAMUSCULAR | Status: AC
  Administered 2024-01-21: 15 mg via INTRAVENOUS
  Filled 2024-01-21: qty 1

## 2024-01-21 MED ORDER — PIPERACILLIN-TAZOBACTAM 3.375 G IVPB
3.3750 g | Freq: Three times a day (TID) | INTRAVENOUS | Status: DC
  Administered 2024-01-21 – 2024-01-22 (×3): 3.375 g via INTRAVENOUS
  Filled 2024-01-21 (×3): qty 50

## 2024-01-21 MED ORDER — ACETAMINOPHEN 500 MG PO TABS
1000.0000 mg | ORAL_TABLET | Freq: Once | ORAL | Status: AC
  Administered 2024-01-21: 1000 mg via ORAL
  Filled 2024-01-21: qty 2

## 2024-01-21 MED ORDER — VANCOMYCIN HCL 1250 MG/250ML IV SOLN
1250.0000 mg | Freq: Once | INTRAVENOUS | Status: AC
  Administered 2024-01-21: 1250 mg via INTRAVENOUS
  Filled 2024-01-21: qty 250

## 2024-01-21 NOTE — Progress Notes (Signed)
 Pharmacy Antibiotic Note  Bailey Reese is a 18 y.o. female admitted on 01/15/2024 with sepsis.  Pharmacy has been consulted for vanc/zosyn  dosing.  Plan: Vanc 1250mg  IV x 1 then 1g IV q12 - goal AUC 400-550 Zosyn  3.375g IV q8 (extended interval infusion) Daily SCr   Height: 5' 4 (162.6 cm) Weight: 65 kg (143 lb 4.8 oz) IBW/kg (Calculated) : 54.7  Temp (24hrs), Avg:100.2 F (37.9 C), Min:98.2 F (36.8 C), Max:102.5 F (39.2 C)  Recent Labs  Lab 01/15/24 0807 01/15/24 1609 01/15/24 1932 01/16/24 0520 01/17/24 0558 01/18/24 0604 01/20/24 1349 01/21/24 0003 01/21/24 0623  WBC 17.6*   < >  --  14.6* 11.7* 13.8* 14.0* 15.6*  --   CREATININE 0.40*  --  0.47  --   --   --  0.61  --   --   LATICACIDVEN  --   --   --   --   --   --   --  1.2 1.0   < > = values in this interval not displayed.    Estimated Creatinine Clearance: 97.7 mL/min (by C-G formula based on SCr of 0.61 mg/dL).    Allergies  Allergen Reactions   Cherry Anaphylaxis, Itching and Swelling   Olanzapine  Other (See Comments)    Drug-induced liver injury   Other Swelling and Other (See Comments)    Pitted fruits   Peanut-Containing Drug Products Anaphylaxis, Itching and Swelling   Plum Pulp Anaphylaxis and Hives   Droperidol  Other (See Comments)    Pt states muscle tension, involuntary movements, eye drift, anxiety   Morphine  And Codeine Hives and Itching   Peach [Prunus Persica] Itching, Swelling and Other (See Comments)   Acetaminophen  Nausea And Vomiting   Compazine  [Prochlorperazine ] Other (See Comments) and Hypertension    Patient stated that this medication causes her HR to increase and well as BP. She also states that it cause hallucinations.   Ibuprofen  Nausea And Vomiting     Thank you for allowing pharmacy to be a part of this patient's care.  Bailey Reese 01/21/2024 1:49 PM

## 2024-01-21 NOTE — Progress Notes (Signed)
 PHARMACY - PHYSICIAN COMMUNICATION CRITICAL VALUE ALERT - BLOOD CULTURE IDENTIFICATION (BCID)  Bailey Reese is an 19 y.o. female who presented to [facility] on 01/15/2024 with a chief complaint of [place] pain crisis  Assessment:  MSSA bacteremia  Name of physician (or Provider) Contacted: Blondie Agent  Current antibiotics: Vancomycin , Zosyn   Changes to prescribed antibiotics recommended:  Continue vanc/Zosyn  today and to readdress ability to narrow tomorrow  Results for orders placed or performed during the hospital encounter of 01/15/24  Blood Culture ID Panel (Reflexed) (Collected: 01/21/2024 12:03 AM)  Result Value Ref Range   Enterococcus faecalis NOT DETECTED NOT DETECTED   Enterococcus Faecium NOT DETECTED NOT DETECTED   Listeria monocytogenes NOT DETECTED NOT DETECTED   Staphylococcus species DETECTED (A) NOT DETECTED   Staphylococcus aureus (BCID) DETECTED (A) NOT DETECTED   Staphylococcus epidermidis NOT DETECTED NOT DETECTED   Staphylococcus lugdunensis NOT DETECTED NOT DETECTED   Streptococcus species NOT DETECTED NOT DETECTED   Streptococcus agalactiae NOT DETECTED NOT DETECTED   Streptococcus pneumoniae NOT DETECTED NOT DETECTED   Streptococcus pyogenes NOT DETECTED NOT DETECTED   A.calcoaceticus-baumannii NOT DETECTED NOT DETECTED   Bacteroides fragilis NOT DETECTED NOT DETECTED   Enterobacterales NOT DETECTED NOT DETECTED   Enterobacter cloacae complex NOT DETECTED NOT DETECTED   Escherichia coli NOT DETECTED NOT DETECTED   Klebsiella aerogenes NOT DETECTED NOT DETECTED   Klebsiella oxytoca NOT DETECTED NOT DETECTED   Klebsiella pneumoniae NOT DETECTED NOT DETECTED   Proteus species NOT DETECTED NOT DETECTED   Salmonella species NOT DETECTED NOT DETECTED   Serratia marcescens NOT DETECTED NOT DETECTED   Haemophilus influenzae NOT DETECTED NOT DETECTED   Neisseria meningitidis NOT DETECTED NOT DETECTED   Pseudomonas aeruginosa NOT DETECTED NOT DETECTED    Stenotrophomonas maltophilia NOT DETECTED NOT DETECTED   Candida albicans NOT DETECTED NOT DETECTED   Candida auris NOT DETECTED NOT DETECTED   Candida glabrata NOT DETECTED NOT DETECTED   Candida krusei NOT DETECTED NOT DETECTED   Candida parapsilosis NOT DETECTED NOT DETECTED   Candida tropicalis NOT DETECTED NOT DETECTED   Cryptococcus neoformans/gattii NOT DETECTED NOT DETECTED   Meth resistant mecA/C and MREJ NOT DETECTED NOT DETECTED    [name] 01/21/2024  8:58 PM

## 2024-01-21 NOTE — Plan of Care (Signed)
  Problem: Clinical Measurements: Goal: Cardiovascular complication will be avoided Outcome: Progressing   Problem: Activity: Goal: Risk for activity intolerance will decrease Outcome: Progressing   Problem: Nutrition: Goal: Adequate nutrition will be maintained Outcome: Progressing   Problem: Health Behavior/Discharge Planning: Goal: Ability to manage health-related needs will improve Outcome: Not Progressing   Problem: Clinical Measurements: Goal: Ability to maintain clinical measurements within normal limits will improve Outcome: Not Progressing Goal: Will remain free from infection Outcome: Not Progressing Goal: Diagnostic test results will improve Outcome: Not Progressing Goal: Respiratory complications will improve Outcome: Not Progressing

## 2024-01-21 NOTE — Progress Notes (Signed)
     Patient Name: Bailey Reese           DOB: 02-May-2006  MRN: [id number]      Admission Date: 01/15/2024  Attending Provider: Sim Emery CROME, MD  Primary Diagnosis: Sickle cell anemia with pain (HCC)   Level of care: Med-Surg   OVERNIGHT EVENT  Febrile (100.9 F), and tachycardic (HR 130s) No acute changes reported. Denies lightheadedness, CP, SOB, cough, sore throat, urinary changes, abdominal discomfort, diarrhea.  Endorses chills, nausea, vomiting.   Last WBC 14, trending up.  Plan: Fluids and antipyretics Collect CBC, blood cultures, UA, lactic acid EKG Monitor fevers and WBC trend Would hold off on antibiotics at this time.  Parks Czajkowski, DNP, ACNPC- AG [facility]

## 2024-01-21 NOTE — Progress Notes (Signed)
 SICKLE CELL SERVICE PROGRESS NOTE  Bailey Reese FMW:[id number] DOB: 08-Oct-2005 DOA: 01/15/2024 PCP: Morgan Slater Pizza, MD  Assessment/Plan: Principal Problem:   Sickle cell anemia with pain (HCC) Active Problems:   Sickle cell pain crisis (HCC)   Chronic pain syndrome   Leukocytosis   GAD (generalized anxiety disorder)  SIRS: Patient had a rapid response last night.  Temperature went to 102.5 with tachycardia.  Also white count 15.6.  She meets size criteria.  No focus for infection from the urine and viral screen negative.  Patient however had an IV came out with possible pus coming out.  Blood cultures have been obtained.  If positive this will turn into bacteremia with sepsis.  Empiric antibiotics started with vancomycin  and cefepime .  Will continue treatment Sickle cell pain crisis: Patient is still complaining of pain despite being on Dilaudid  PCA, Toradol  and oral medications.  She has had chronic pain and difficult to get her pain control.  She says she was feeling better but now has recurrence.  No change in pain medication. Encouraged to continue using the Dilaudid  PCA.  Adjust her oral medications Anemia of chronic disease: Continue monitoring H&H. Chronic pain syndrome: Continue home regimen PTSD with anxiety: Continue antianxiety medications. GERD: Continue PPIs  Code Status: Full code Family Communication: No family at bedside Disposition Plan: Home  [facility]  Pager [phone]. If 7PM-7AM, please contact night-coverage.  01/21/2024, 6:36 AM  LOS: 6 days   Brief narrative: Bailey Reese is a 18 y.o. female, with medical history significant of sickle cell disease, chronic pain, history of hemorrhagic stroke in 2017, asthma, PTSD, depression, anxiety, GERD presenting from [facility] ED due to concern for sickle cell pain crisis.   Consultants: None  Procedures: Chest x-ray  Antibiotics: None  HPI/Subjective: Patient is having significant fever, leukocytosis.   Patient also has continued pain.  No cause identified.  Objective: Vitals:   01/21/24 0313 01/21/24 0409 01/21/24 0431 01/21/24 0534  BP:  123/69    Pulse:  (!) 138  (!) 142  Resp: 16 18 17 16   Temp:  (!) 100.9 F (38.3 C)  (!) 101 F (38.3 C)  TempSrc:  Oral  Oral  SpO2:  95%    Weight:      Height:       Weight change:   Intake/Output Summary (Last 24 hours) at 01/21/2024 0636 Last data filed at 01/21/2024 0500 Gross per 24 hour  Intake 232.5 ml  Output 1700 ml  Net -1467.5 ml    General: Alert, awake, oriented x3, in no acute distress.  HEENT: [place]/AT PEERL, EOMI Neck: Trachea midline,  no masses, no thyromegal,y no JVD, no carotid bruit OROPHARYNX:  Moist, No exudate/ erythema/lesions.  Heart: Regular rate and rhythm, without murmurs, rubs, gallops, PMI non-displaced, no heaves or thrills on palpation.  Lungs: Clear to auscultation, no wheezing or rhonchi noted. No increased vocal fremitus resonant to percussion  Abdomen: Soft, nontender, nondistended, positive bowel sounds, no masses no hepatosplenomegaly noted..  Neuro: No focal neurological deficits noted cranial nerves II through XII grossly intact. DTRs 2+ bilaterally upper and lower extremities. Strength 5 out of 5 in bilateral upper and lower extremities. Musculoskeletal: No warm swelling or erythema around joints, no spinal tenderness noted. Psychiatric: Patient alert and oriented x3, good insight and cognition, good recent to remote recall. Lymph node survey: No cervical axillary or inguinal lymphadenopathy noted.   Data Reviewed: Basic Metabolic Panel: Recent Labs  Lab 01/15/24 0807 01/15/24 1932 01/20/24 1349  NA 139  --  137  K 3.4*  --  4.6  CL 106  --  101  CO2 23  --  23  GLUCOSE 102*  --  94  BUN 8  --  10  CREATININE 0.40* 0.47 0.61  CALCIUM 9.3  --  9.8   Liver Function Tests: Recent Labs  Lab 01/20/24 1349  AST 94*  ALT 109*  ALKPHOS 168*  BILITOT 7.9*  PROT 8.0  ALBUMIN 4.4   Recent  Labs  Lab 01/15/24 0807  LIPASE 28   No results for input(s): AMMONIA in the last 168 hours. CBC: Recent Labs  Lab 01/15/24 0807 01/15/24 1609 01/16/24 0520 01/17/24 0558 01/18/24 0604 01/20/24 1349 01/21/24 0003  WBC 17.6*   < > 14.6* 11.7* 13.8* 14.0* 15.6*  NEUTROABS 9.7*  --   --   --   --  9.6*  --   HGB 7.2*   < > 7.1* 7.5* 8.0* 8.0* 7.3*  HCT 20.3*   < > 21.4* 23.0* 24.7* 24.4* 21.8*  MCV 94.4   < > 99.1 100.4* 104.7* 101.2* 104.8*  PLT 412*   < > 439* 444* 440* 450* 375   < > = values in this interval not displayed.   Cardiac Enzymes: No results for input(s): CKTOTAL, CKMB, CKMBINDEX, TROPONINI in the last 168 hours. BNP (last 3 results) Recent Labs    12/01/23 2047  BNP 22.4    ProBNP (last 3 results) No results for input(s): PROBNP in the last 8760 hours.  CBG: No results for input(s): GLUCAP in the last 168 hours.  Recent Results (from the past 240 hours)  Culture, blood (Routine X 2) w Reflex to ID Panel     Status: None (Preliminary result)   Collection Time: 01/21/24 12:03 AM   Specimen: BLOOD LEFT ARM  Result Value Ref Range Status   Specimen Description   Final    BLOOD LEFT ARM Performed at [facility], [address] [place], [place] [address]    Special Requests   Final    BOTTLES DRAWN AEROBIC AND ANAEROBIC Blood Culture results may not be optimal due to an inadequate volume of blood received in culture bottles Performed at [facility], [address], [place], [place] [address]    Culture PENDING  Incomplete   Report Status PENDING  Incomplete  Culture, blood (Routine X 2) w Reflex to ID Panel     Status: None (Preliminary result)   Collection Time: 01/21/24 12:03 AM   Specimen: BLOOD LEFT HAND  Result Value Ref Range Status   Specimen Description   Final    BLOOD LEFT HAND Performed at [facility], [address], [place], [place] [address]    Special Requests   Final    BOTTLES DRAWN  AEROBIC AND ANAEROBIC Blood Culture results may not be optimal due to an inadequate volume of blood received in culture bottles Performed at [facility], [address] [place], [place] [address]    Culture PENDING  Incomplete   Report Status PENDING  Incomplete     Studies: DG Chest Port 1 View Result Date: 01/21/2024 CLINICAL DATA:  Fever and sepsis EXAM: PORTABLE CHEST 1 VIEW COMPARISON:  01/15/2024 FINDINGS: Cardiopericardial silhouette is at upper limits of normal for size. Streaky opacity in the left base suggest atelectasis. No dense focal airspace consolidation. No pulmonary edema or pleural effusion. No acute bony abnormality. IMPRESSION: Streaky opacity in the  left base suggests atelectasis. Electronically Signed   By: Camellia Candle M.D.   On: 01/21/2024 06:26   DG Chest 2 View Result Date: 01/15/2024 CLINICAL DATA:  18 year old female with sickle cell disease and chest pain. EXAM: CHEST - 2 VIEW COMPARISON:  CTA chest 12/11/2023. Chest radiographs 01/05/2024 and earlier. FINDINGS: AP and lateral views of the chest 0820 hours. Lower lung volumes. Stable mild cardiomegaly. Other mediastinal contours are within normal limits. Visualized tracheal air column is within normal limits. No pneumothorax, pleural effusion, pulmonary edema, confluent lung opacity. Stable lung markings. Stable visualized osseous structures. Intermittent H-shaped vertebral body deformities better demonstrated by CTA. Negative visible bowel gas. IMPRESSION: Lower lung volumes. No acute cardiopulmonary abnormality. Sequelae of sickle cell disease. Electronically Signed   By: VEAR Hurst M.D.   On: 01/15/2024 08:39   DG Chest 2 View Result Date: 01/05/2024 CLINICAL DATA:  Shortness of breath. EXAM: CHEST - 2 VIEW COMPARISON:  None Available. FINDINGS: The heart size and mediastinal contours are within normal limits. Both lungs are clear. The visualized skeletal structures are unremarkable. IMPRESSION: No  active cardiopulmonary disease. Electronically Signed   By: Lynwood Landy Raddle M.D.   On: 01/05/2024 08:06   MR BRAIN W WO CONTRAST Result Date: 01/02/2024 EXAM: MRI BRAIN WITH AND WITHOUT CONTRAST 01/02/2024 03:45:45 PM TECHNIQUE: Multiplanar multisequence MRI of the head/brain was performed with and without the administration of intravenous contrast. 7mL gadavist  (gadobutrol  1 mmol/mL). COMPARISON: CT head 12/31/2023. CLINICAL HISTORY: Seizure, new-onset, no history of trauma. FINDINGS: BRAIN AND VENTRICLES: No acute infarct. No acute intracranial hemorrhage. No mass effect or midline shift. The ventricles are normal in size. The cerebellar tonsils extend 18 mm below the foramen magnum and are compressed. The CSF spaces at the foramen magnum are effaced. No brain sagging. The brain is normal in signal, and no abnormal enhancement is identified . Dedicated thin section imaging through the temporal lobes demonstrates normal volume and signal of the hippocampi. There is no evidence of heterotopia or cortical dysplasia. ORBITS: No acute abnormality. SINUSES: No acute abnormality. BONES AND SOFT TISSUES: Diffusely diminished bone marrow T1 signal intensity, nonspecific but compatible with the history of sickle cell disease. IMPRESSION: 1. Chiari I malformation. 2. Otherwise negative brain MRI. Electronically signed by: Dasie Hamburg MD 01/02/2024 04:29 PM EDT RP Workstation: [id number]   EEG adult Result Date: 01/01/2024 Shelton Arlin KIDD, MD     01/01/2024  8:44 PM Patient Name: Bailey Reese MRN: [id number] Epilepsy Attending: Arlin KIDD Shelton Referring Physician/Provider: Alfornia Madison, MD Date: 01/01/2024 Duration: 27.29 mins Patient history: 18 y.o. female with possible seizure-like activity. EEG to evaluate for seizure. Level of alertness: Awake AEDs during EEG study: None Technical aspects: This EEG study was done with scalp electrodes positioned according to the 10-20 International system of electrode  placement. Electrical activity was reviewed with band pass filter of 1-70Hz , sensitivity of 7 uV/mm, display speed of 4mm/sec with a 60Hz  notched filter applied as appropriate. EEG data were recorded continuously and digitally stored.  Video monitoring was available and reviewed as appropriate. Description: Per EEG tech, could not get all electrodes under 10 ohms due to condition of pts scalp. O1 P3 Pz P4 C3 and Cz will not reach scalp despite multiple attempts. Hyperventilation and photic stimulation were not performed.   IMPRESSION: This study was not interpretable due to significant electrode artifact. Arlin KIDD Shelton   DG Chest 2 View Result Date: 12/31/2023 EXAM: 2 VIEW(S) XRAY OF THE CHEST 12/31/2023 10:02:00  PM COMPARISON: None available. CLINICAL HISTORY: Evaluate for acute chest, sickle cell. Sickle cell. FINDINGS: LUNGS AND PLEURA: No focal pulmonary opacity. No pulmonary edema. No pleural effusion. No pneumothorax. HEART AND MEDIASTINUM: No acute abnormality of the cardiac and mediastinal silhouettes. BONES AND SOFT TISSUES: No acute osseous abnormality. IMPRESSION: 1. No acute process. Electronically signed by: Franky Stanford MD 12/31/2023 10:12 PM EDT RP Workstation: [id number]   CT Head Wo Contrast Result Date: 12/31/2023 EXAM: CT HEAD WITHOUT CONTRAST 12/31/2023 10:07:18 PM TECHNIQUE: CT of the head was performed without the administration of intravenous contrast. Automated exposure control, iterative reconstruction, and/or weight based adjustment of the mA/kV was utilized to reduce the radiation dose to as low as reasonably achievable. COMPARISON: 03/02/2017 CLINICAL HISTORY: Seizure, sickle cell, history of intracerebral hemorrhage (ICH). Patient brought in by EMS for seizure. Patient was at church and not acting normal. Patient was described to be having a seizure by mother. The patient has a history of seizures, sickle cell, and aneurysms causing strokes per EMS. Patient crying and unable to  console at triage. FINDINGS: BRAIN AND VENTRICLES: No acute hemorrhage. Gray-white differentiation is preserved. No hydrocephalus. No extra-axial collection. No mass effect or midline shift. ORBITS: No acute abnormality. SINUSES: No acute abnormality. SOFT TISSUES AND SKULL: No acute soft tissue abnormality. No skull fracture. Cerebellar tonsils extend 12 mm below the foramen magnum consistent with Chiari malformation. IMPRESSION: 1. No acute intracranial abnormality. 2. 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 Electronically signed by: Franky Stanford MD 12/31/2023 10:11 PM EDT RP Workstation: [id number]   DG Chest 2 View Result Date: 12/26/2023 EXAM: 2 VIEW(S) XRAY OF THE CHEST 12/26/2023 94:71:71 AM COMPARISON: 07/13/2023 CLINICAL HISTORY: ?acute chest - whole body SSD pain, but with cough and chest pain too. PER ER NOTE; Pt to ED via EMS from home, pt reports pain all over body that is throbbing, pt has hx sickle cell. Pt reports she was seen here for same 2 weeks ago. FINDINGS: LUNGS AND PLEURA: No focal pulmonary opacity. No pulmonary edema. No pleural effusion. No pneumothorax. HEART AND MEDIASTINUM: No acute abnormality of the cardiac and mediastinal silhouettes. BONES AND SOFT TISSUES: No acute osseous abnormality. IMPRESSION: 1. No acute process. Electronically signed by: Taylor Stroud MD 12/26/2023 06:56 AM EDT RP Workstation: [id number]    Scheduled Meds:  Deferasirox   360 mg Oral BID   enoxaparin  (LOVENOX ) injection  40 mg Subcutaneous Q24H   HYDROmorphone    Intravenous Q4H   hydroxyurea   1,500 mg Oral Daily   hydrOXYzine   25 mg Oral BID   pantoprazole   40 mg Oral Daily   prazosin   1 mg Oral QHS   pregabalin   150 mg Oral TID   senna-docusate  1 tablet Oral BID   Continuous Infusions:  Principal Problem:   Sickle cell anemia with pain (HCC) Active Problems:   Sickle cell pain crisis (HCC)   Chronic pain syndrome   Leukocytosis   GAD  (generalized anxiety disorder)

## 2024-01-21 NOTE — Progress Notes (Signed)
   01/21/24 2123  Assess: MEWS Score  Temp (!) 102.2 F (39 C)  BP 137/84  MAP (mmHg) 98  Pulse Rate (!) 130  Resp 18  O2 Device Room Air  Assess: MEWS Score  MEWS Temp 2  MEWS Systolic 0  MEWS Pulse 3  MEWS RR 0  MEWS LOC 0  MEWS Score 5  MEWS Score Color Red  Assess: if the MEWS score is Yellow or Red  Were vital signs accurate and taken at a resting state? Yes  Does the patient meet 2 or more of the SIRS criteria? Yes  Does the patient have a confirmed or suspected source of infection? Yes  MEWS guidelines implemented  Yes, red  Treat  MEWS Interventions Considered administering scheduled or prn medications/treatments as ordered  Take Vital Signs  Increase Vital Sign Frequency  Red: Q1hr x2, continue Q4hrs until patient remains green for 12hrs  Escalate  MEWS: Escalate Red: Discuss with charge nurse and notify provider. Consider notifying RRT. If remains red for 2 hours consider need for higher level of care  Notify: Charge Nurse/RN  Name of Charge Nurse/RN Notified [place], RN  Provider Notification  Provider Name/Title Lynwood MARLA Kipper, NP  Date Provider Notified 01/21/24  Time Provider Notified 2130  Method of Notification Page  Notification Reason Change in status  Provider response Other (Comment) (Ice packs, blanket removed, rechecked 30 min post)  Date of Provider Response 01/21/24  Time of Provider Response 2137  Assess: SIRS CRITERIA  SIRS Temperature  1  SIRS Respirations  0  SIRS Pulse 1  SIRS WBC 0  SIRS Score Sum  2

## 2024-01-21 NOTE — Progress Notes (Signed)
   01/21/24 0409  Assess: MEWS Score  Temp (!) 100.9 F (38.3 C)  BP 123/69  MAP (mmHg) 86  Pulse Rate (!) 138  Resp 18  SpO2 95 %  O2 Device Room Air  Assess: MEWS Score  MEWS Temp 1  MEWS Systolic 0  MEWS Pulse 3  MEWS RR 0  MEWS LOC 0  MEWS Score 4  MEWS Score Color Red  Assess: if the MEWS score is Yellow or Red  Were vital signs accurate and taken at a resting state? Yes  Does the patient meet 2 or more of the SIRS criteria? No  MEWS guidelines implemented  Yes, red  Treat  MEWS Interventions Considered administering scheduled or prn medications/treatments as ordered  Take Vital Signs  Increase Vital Sign Frequency  Red: Q1hr x2, continue Q4hrs until patient remains green for 12hrs  Escalate  MEWS: Escalate Red: Discuss with charge nurse and notify provider. Consider notifying RRT. If remains red for 2 hours consider need for higher level of care  Notify: Charge Nurse/RN  Name of Charge Nurse/RN Notified [place], RN  Provider Notification  Provider Name/Title Lavanda Horns, NP  Date Provider Notified 01/21/24  Time Provider Notified 0430  Method of Notification Page  Notification Reason Change in status  Provider response See new orders (Tylenol  Ordered)  Date of Provider Response 01/21/24  Time of Provider Response 0435  Assess: SIRS CRITERIA  SIRS Temperature  0  SIRS Respirations  0  SIRS Pulse 1  SIRS WBC 0  SIRS Score Sum  1

## 2024-01-21 NOTE — Progress Notes (Signed)
   01/21/24 0534  Assess: MEWS Score  Temp (!) 101 F (38.3 C)  Pulse Rate (!) 142  Resp 16  Assess: MEWS Score  MEWS Temp 1  MEWS Systolic 0  MEWS Pulse 3  MEWS RR 0  MEWS LOC 0  MEWS Score 4  MEWS Score Color Red  Assess: if the MEWS score is Yellow or Red  Were vital signs accurate and taken at a resting state? Yes  Does the patient meet 2 or more of the SIRS criteria? Yes  Does the patient have a confirmed or suspected source of infection? No  MEWS guidelines implemented  No, previously red, continue vital signs every 4 hours  Notify: Charge Nurse/RN  Name of Charge Nurse/RN Notified [place], RN  Provider Notification  Provider Name/Title Lavanda Horns, NP  Date Provider Notified 01/21/24  Time Provider Notified 0530  Method of Notification Page  Notification Reason Change in status  Provider response See new orders (Toradol  IVP added)  Date of Provider Response 01/21/24  Time of Provider Response 0545  Assess: SIRS CRITERIA  SIRS Temperature  1  SIRS Respirations  0  SIRS Pulse 1  SIRS WBC 1  SIRS Score Sum  3

## 2024-01-22 DIAGNOSIS — D5700 Hb-SS disease with crisis, unspecified: Secondary | ICD-10-CM | POA: Diagnosis not present

## 2024-01-22 DIAGNOSIS — B9561 Methicillin susceptible Staphylococcus aureus infection as the cause of diseases classified elsewhere: Secondary | ICD-10-CM | POA: Diagnosis not present

## 2024-01-22 DIAGNOSIS — D57219 Sickle-cell/Hb-C disease with crisis, unspecified: Secondary | ICD-10-CM | POA: Diagnosis not present

## 2024-01-22 DIAGNOSIS — I808 Phlebitis and thrombophlebitis of other sites: Secondary | ICD-10-CM | POA: Diagnosis not present

## 2024-01-22 DIAGNOSIS — R7881 Bacteremia: Secondary | ICD-10-CM | POA: Diagnosis not present

## 2024-01-22 LAB — CBC
HCT: 21.4 % — ABNORMAL LOW (ref 36.0–46.0)
Hemoglobin: 7 g/dL — ABNORMAL LOW (ref 12.0–15.0)
MCH: 34.3 pg — ABNORMAL HIGH (ref 26.0–34.0)
MCHC: 32.7 g/dL (ref 30.0–36.0)
MCV: 104.9 fL — ABNORMAL HIGH (ref 80.0–100.0)
Platelets: 364 K/uL (ref 150–400)
RBC: 2.04 MIL/uL — ABNORMAL LOW (ref 3.87–5.11)
RDW: 22.7 % — ABNORMAL HIGH (ref 11.5–15.5)
WBC: 12.7 K/uL — ABNORMAL HIGH (ref 4.0–10.5)
nRBC: 2.1 % — ABNORMAL HIGH (ref 0.0–0.2)

## 2024-01-22 LAB — CREATININE, SERUM
Creatinine, Ser: 0.83 mg/dL (ref 0.44–1.00)
GFR, Estimated: >60 mL/min (ref >60)

## 2024-01-22 MED ORDER — CEFAZOLIN SODIUM-DEXTROSE 2-4 GM/100ML-% IV SOLN
2.0000 g | Freq: Three times a day (TID) | INTRAVENOUS | Status: DC
  Administered 2024-01-22 – 2024-01-26 (×13): 2 g via INTRAVENOUS
  Filled 2024-01-22 (×13): qty 100

## 2024-01-22 MED ORDER — HYDROMORPHONE HCL 2 MG/ML IJ SOLN
2.0000 mg | Freq: Once | INTRAMUSCULAR | Status: AC
  Administered 2024-01-22: 2 mg via INTRAVENOUS
  Filled 2024-01-22: qty 1

## 2024-01-22 NOTE — Progress Notes (Signed)
   01/21/24 2333  Assess: MEWS Score  Temp (!) 102.5 F (39.2 C)  Pulse Rate (!) 132  Resp 16  Assess: MEWS Score  MEWS Temp 2  MEWS Systolic 0  MEWS Pulse 3  MEWS RR 0  MEWS LOC 0  MEWS Score 5  MEWS Score Color Red  Assess: if the MEWS score is Yellow or Red  Were vital signs accurate and taken at a resting state? Yes  Does the patient meet 2 or more of the SIRS criteria? Yes  Does the patient have a confirmed or suspected source of infection? Yes  MEWS guidelines implemented  No, previously red, continue vital signs every 4 hours  Provider Notification  Provider Name/Title Lynwood Kipper, NP  Date Provider Notified 01/21/24  Time Provider Notified 2345  Method of Notification Page  Notification Reason Change in status  Provider response See new orders  Date of Provider Response 01/22/24  Time of Provider Response 0010  Assess: SIRS CRITERIA  SIRS Temperature  1  SIRS Respirations  0  SIRS Pulse 1  SIRS WBC 0  SIRS Score Sum  2

## 2024-01-22 NOTE — Progress Notes (Signed)
 Pharmacy Antibiotic Note  Bailey Reese is a 18 y.o. female admitted on 01/15/2024 for sickle cell crisis and found to have bacteremia secondary to peripheral IV line infection.  Pharmacy has been consulted for Cefazolin  dosing to narrow therapy for MSSA bacteremia. Scr is within normal limits.   Plan: Stop Zosyn  Stop Vanc Start Cefazolin  2g IV q8 hours   Height: 5' 4 (162.6 cm) Weight: 65 kg (143 lb 4.8 oz) IBW/kg (Calculated) : 54.7  Temp (24hrs), Avg:100.2 F (37.9 C), Min:98.5 F (36.9 C), Max:102.5 F (39.2 C)  Recent Labs  Lab 01/15/24 1932 01/16/24 0520 01/17/24 0558 01/18/24 0604 01/20/24 1349 01/21/24 0003 01/21/24 0623 01/22/24 0512  WBC  --    < > 11.7* 13.8* 14.0* 15.6*  --  12.7*  CREATININE 0.47  --   --   --  0.61  --   --  0.83  LATICACIDVEN  --   --   --   --   --  1.2 1.0  --    < > = values in this interval not displayed.    Estimated Creatinine Clearance: 94.1 mL/min (by C-G formula based on SCr of 0.83 mg/dL).    Allergies  Allergen Reactions   Cherry Anaphylaxis, Itching and Swelling   Olanzapine  Other (See Comments)    Drug-induced liver injury   Other Swelling and Other (See Comments)    Pitted fruits   Peanut-Containing Drug Products Anaphylaxis, Itching and Swelling   Plum Pulp Anaphylaxis and Hives   Droperidol  Other (See Comments)    Pt states muscle tension, involuntary movements, eye drift, anxiety   Morphine  And Codeine Hives and Itching   Peach [Prunus Persica] Itching, Swelling and Other (See Comments)   Acetaminophen  Nausea And Vomiting   Compazine  [Prochlorperazine ] Other (See Comments) and Hypertension    Patient stated that this medication causes her HR to increase and well as BP. She also states that it cause hallucinations.   Ibuprofen  Nausea And Vomiting    Antimicrobials this admission: Vanc 9/7 >> 9/8 Zosyn  9/7 >> 9/8   Dose adjustments this admission: Cefazolin  2g IV q8 hours 9/8 >> 9/14  Microbiology  results: 9/7 BCx: 4/4 MSSA  Thank you for allowing pharmacy to be a part of this patient's care.  Dionicia Canavan, PharmD, RPh PGY1 Acute Care Pharmacy Resident [facility]  01/22/2024 1:31 PM

## 2024-01-22 NOTE — Consult Note (Signed)
 [facility]    Date of Admission:  01/15/2024     Reason for Consult: mssa bsi    Referring Provider: albertina; Bailey Reese     Lines:  Peripheral iv's  Abx: 9/8-c cefazolin   9/7-c vanc piptazo   Assessment: 18 yo female here with sickle cell crisis, course complicated by nosocomial sepsis in setting of peripheral iv line infection with mssa bacteremia   9/7 bcx mssa 9/7 resp viral pcr negative  She has diffuse body aches/pain with sickle cell crisis and is on a pca pump. Nothing new symptomatically around time of sepsis outside of the right hand. 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 And concern for microthrombus with sickle cell crisis. Despite clear onset of sepsis timing, will plan 4 week abx treatment  Plan: Tte  Repeat bcx tomorrow Narrow abx to cefazolin  When the sickle cell crisis preponderant symptoms resolve, will see if there is any specific area of her bone/joint that is still symptomatic and will recommend scanning mri to r/o involvement due to staph aureus At least 4 weeks abx as mentioned pending lack of involvement of bone / joint or endocarditis Maintain standard isolation precaution Discussed with primary team      ------------------------------------------------ Principal Problem:   Sickle cell anemia with pain (HCC) Active Problems:   Sickle cell pain crisis (HCC)   Chronic pain syndrome   Leukocytosis   GAD (generalized anxiety disorder)    HPI: Bailey Reese is a 18 y.o. female  here with sickle cell crisis, course complicated by nosocomial sepsis in setting of peripheral iv line infection with mssa bacteremia    She came in on 9/1. No sign of infection at that time. On pcp pump and still having diffuse body ache/pain  On hd#7 developed fever. Leukocytosis moderate at admission and remains stable otherwise. This is  in setting of the right hand dorsum pain where a peripheral iv was. The iv had self fallen off and there was some pus at the area  Bcx showed mssa  No cough/dyspnea/dysuria otherwise  Bodyache remains same  No other complaint    Family History  Problem Relation Age of Onset   Diabetes Paternal Aunt     Social History   Tobacco Use   Smoking status: Never   Smokeless tobacco: Never  Vaping Use   Vaping status: Never Used  Substance Use Topics   Alcohol  use: No   Drug use: Never    Allergies  Allergen Reactions   Cherry Anaphylaxis, Itching and Swelling   Olanzapine  Other (See Comments)    Drug-induced liver injury   Other Swelling and Other (See Comments)    Pitted fruits   Peanut-Containing Drug Products Anaphylaxis, Itching and Swelling   Plum Pulp Anaphylaxis and Hives   Droperidol  Other (See Comments)    Pt states muscle tension, involuntary movements, eye drift, anxiety   Morphine  And Codeine Hives and Itching   Peach [Prunus Persica] Itching, Swelling and Other (See Comments)   Acetaminophen  Nausea And Vomiting   Compazine  [Prochlorperazine ] Other (See Comments) and Hypertension    Patient stated that this medication causes her HR to increase and well as BP. She also states that it cause hallucinations.   Ibuprofen  Nausea And Vomiting    Review of Systems: ROS All Other ROS was negative, except mentioned above   Past Medical History:  Diagnosis Date  Anxiety    Depression    PTSD (post-traumatic stress disorder)    Sickle cell anemia (HCC)        Scheduled Meds:  Deferasirox   360 mg Oral BID   enoxaparin  (LOVENOX ) injection  40 mg Subcutaneous Q24H   HYDROmorphone    Intravenous Q4H   hydroxyurea   1,500 mg Oral Daily   hydrOXYzine   25 mg Oral BID   pantoprazole   40 mg Oral Daily   prazosin   1 mg Oral QHS   pregabalin   150 mg Oral TID   senna-docusate  1 tablet Oral BID   Continuous Infusions:   ceFAZolin  (ANCEF ) IV     PRN  Meds:.acetaminophen , diphenhydrAMINE , hydrOXYzine , naloxone  **AND** sodium chloride  flush, ondansetron  (ZOFRAN ) IV, oxyCODONE , polyethylene glycol   OBJECTIVE: Blood pressure 117/69, pulse 100, temperature 98.8 F (37.1 C), temperature source Oral, resp. rate 18, height 5' 4 (1.626 m), weight 65 kg, SpO2 97%.  Physical Exam  General/constitutional: no distress, pleasant HEENT: Normocephalic, PER, Conj Clear, EOMI, Oropharynx clear Neck supple CV: rrr no mrg Lungs: clear to auscultation, normal respiratory effort Abd: Soft, Nontender Ext: no edema Skin: No Rash Neuro: nonfocal MSK: no peripheral joint swelling/tenderness/warmth; back spines nontender    Lab Results Lab Results  Component Value Date   WBC 12.7 (H) 01/22/2024   HGB 7.0 (L) 01/22/2024   HCT 21.4 (L) 01/22/2024   MCV 104.9 (H) 01/22/2024   PLT 364 01/22/2024    Lab Results  Component Value Date   CREATININE 0.83 01/22/2024   BUN 10 01/20/2024   NA 137 01/20/2024   K 4.6 01/20/2024   CL 101 01/20/2024   CO2 23 01/20/2024    Lab Results  Component Value Date   ALT 109 (H) 01/20/2024   AST 94 (H) 01/20/2024   ALKPHOS 168 (H) 01/20/2024   BILITOT 7.9 (H) 01/20/2024      Microbiology: Recent Results (from the past 240 hours)  Culture, blood (Routine X 2) w Reflex to ID Panel     Status: Abnormal (Preliminary result)   Collection Time: 01/21/24 12:03 AM   Specimen: BLOOD LEFT ARM  Result Value Ref Range Status   Specimen Description   Final    BLOOD LEFT ARM Performed at [facility], [address] [place], [place] [address]    Special Requests   Final    BOTTLES DRAWN AEROBIC AND ANAEROBIC Blood Culture results may not be optimal due to an inadequate volume of blood received in culture bottles Performed at [facility], [address] [place], [place] [address]    Culture  Setup Time   Final    GRAM POSITIVE COCCI IN BOTH AEROBIC AND ANAEROBIC BOTTLES CRITICAL  RESULT CALLED TO, READ BACK BY AND VERIFIED WITH: PHARMD JUSTIN L. [id number] AT 2056, ADC    Culture (A)  Final    STAPHYLOCOCCUS AUREUS SUSCEPTIBILITIES TO FOLLOW Performed at [facility], [address], [place], [place] [address]    Report Status PENDING  Incomplete  Culture, blood (Routine X 2) w Reflex to ID Panel     Status: Abnormal (Preliminary result)   Collection Time: 01/21/24 12:03 AM   Specimen: BLOOD LEFT HAND  Result Value Ref Range Status   Specimen Description   Final    BLOOD LEFT HAND Performed at [facility], [address], [place], [place] [address]    Special Requests   Final    BOTTLES DRAWN AEROBIC AND ANAEROBIC Blood Culture  results may not be optimal due to an inadequate volume of blood received in culture bottles Performed at [facility], [address], [place], [place] [address]    Culture  Setup Time   Final    GRAM POSITIVE COCCI IN CLUSTERS IN BOTH AEROBIC AND ANAEROBIC BOTTLES CRITICAL VALUE NOTED.  VALUE IS CONSISTENT WITH PREVIOUSLY REPORTED AND CALLED VALUE. Performed at [facility], [address] [place], [place] [address]    Culture STAPHYLOCOCCUS AUREUS (A)  Final   Report Status PENDING  Incomplete  Blood Culture ID Panel (Reflexed)     Status: Abnormal   Collection Time: 01/21/24 12:03 AM  Result Value Ref Range Status   Enterococcus faecalis NOT DETECTED NOT DETECTED Final   Enterococcus Faecium NOT DETECTED NOT DETECTED Final   Listeria monocytogenes NOT DETECTED NOT DETECTED Final   Staphylococcus species DETECTED (A) NOT DETECTED Final    Comment: CRITICAL RESULT CALLED TO, READ BACK BY AND VERIFIED WITH: PHARMD JUSTIN L. [id number] AT 2056, ADC    Staphylococcus aureus (BCID) DETECTED (A) NOT DETECTED Final    Comment: CRITICAL RESULT CALLED TO, READ BACK BY AND VERIFIED WITH: PHARMD JUSTIN L. [id number] AT 2056, ADC    Staphylococcus epidermidis NOT DETECTED NOT DETECTED Final   Staphylococcus  lugdunensis NOT DETECTED NOT DETECTED Final   Streptococcus species NOT DETECTED NOT DETECTED Final   Streptococcus agalactiae NOT DETECTED NOT DETECTED Final   Streptococcus pneumoniae NOT DETECTED NOT DETECTED Final   Streptococcus pyogenes NOT DETECTED NOT DETECTED Final   A.calcoaceticus-baumannii NOT DETECTED NOT DETECTED Final   Bacteroides fragilis NOT DETECTED NOT DETECTED Final   Enterobacterales NOT DETECTED NOT DETECTED Final   Enterobacter cloacae complex NOT DETECTED NOT DETECTED Final   Escherichia coli NOT DETECTED NOT DETECTED Final   Klebsiella aerogenes NOT DETECTED NOT DETECTED Final   Klebsiella oxytoca NOT DETECTED NOT DETECTED Final   Klebsiella pneumoniae NOT DETECTED NOT DETECTED Final   Proteus species NOT DETECTED NOT DETECTED Final   Salmonella species NOT DETECTED NOT DETECTED Final   Serratia marcescens NOT DETECTED NOT DETECTED Final   Haemophilus influenzae NOT DETECTED NOT DETECTED Final   Neisseria meningitidis NOT DETECTED NOT DETECTED Final   Pseudomonas aeruginosa NOT DETECTED NOT DETECTED Final   Stenotrophomonas maltophilia NOT DETECTED NOT DETECTED Final   Candida albicans NOT DETECTED NOT DETECTED Final   Candida auris NOT DETECTED NOT DETECTED Final   Candida glabrata NOT DETECTED NOT DETECTED Final   Candida krusei NOT DETECTED NOT DETECTED Final   Candida parapsilosis NOT DETECTED NOT DETECTED Final   Candida tropicalis NOT DETECTED NOT DETECTED Final   Cryptococcus neoformans/gattii NOT DETECTED NOT DETECTED Final   Meth resistant mecA/C and MREJ NOT DETECTED NOT DETECTED Final    Comment: Performed at [facility], [address] [place], [place] [address]  Respiratory (~20 pathogens) panel by PCR     Status: None   Collection Time: 01/21/24  7:19 AM   Specimen: Nasopharyngeal Swab; Respiratory  Result Value Ref Range Status   Adenovirus NOT DETECTED NOT DETECTED Final   Coronavirus 229E NOT DETECTED NOT DETECTED Final    Comment:  (NOTE) The Coronavirus on the Respiratory Panel, DOES NOT test for the novel  Coronavirus (2019 nCoV)    Coronavirus HKU1 NOT DETECTED NOT DETECTED Final   Coronavirus NL63 NOT DETECTED NOT DETECTED Final   Coronavirus OC43 NOT DETECTED NOT DETECTED Final   Metapneumovirus NOT DETECTED NOT DETECTED Final  Rhinovirus / Enterovirus NOT DETECTED NOT DETECTED Final   Influenza A NOT DETECTED NOT DETECTED Final   Influenza B NOT DETECTED NOT DETECTED Final   Parainfluenza Virus 1 NOT DETECTED NOT DETECTED Final   Parainfluenza Virus 2 NOT DETECTED NOT DETECTED Final   Parainfluenza Virus 3 NOT DETECTED NOT DETECTED Final   Parainfluenza Virus 4 NOT DETECTED NOT DETECTED Final   Respiratory Syncytial Virus NOT DETECTED NOT DETECTED Final   Bordetella pertussis NOT DETECTED NOT DETECTED Final   Bordetella Parapertussis NOT DETECTED NOT DETECTED Final   Chlamydophila pneumoniae NOT DETECTED NOT DETECTED Final   Mycoplasma pneumoniae NOT DETECTED NOT DETECTED Final    Comment: Performed at [facility], [address] [place], [place] [address]     Serology:    Imaging: If present, new imagings (plain films, ct scans, and mri) have been personally visualized and interpreted; radiology reports have been reviewed. Decision making incorporated into the Impression / Recommendations.   9/7 cxr Streaky opacity in the left base suggests atelectasis.   9/1 cxr 2 view Lower lung volumes. No acute cardiopulmonary abnormality. Sequelae of sickle cell disease     Constance ONEIDA Passer, MD [facility] [phone] pager    01/22/2024, 1:45 PM

## 2024-01-22 NOTE — Plan of Care (Signed)

## 2024-01-22 NOTE — Progress Notes (Signed)
 SICKLE CELL SERVICE PROGRESS NOTE  Bailey Reese FMW:[id number] DOB: [birth year] DOA: 01/15/2024 PCP: Morgan Slater Pizza, MD  Assessment/Plan: Principal Problem:   Sickle cell anemia with pain (HCC) Active Problems:   Sickle cell pain crisis (HCC)   Chronic pain syndrome   Leukocytosis   GAD (generalized anxiety disorder)  Sepsis: Patient had a rapid response 2 nights ago with significant fever, leukocytosis and now blood cultures growing Staph aureus.  Appears to be infection related to her line.  She had an peripheral IV line that apparently was oozing some pus.  Patient has been empirically on vancomycin  and cefepime .  Fever has resolved overnight.   Sickle cell pain crisis: Patient is still complaining of pain despite being on Dilaudid  PCA, Toradol  and oral medications.  She has had chronic pain and difficult to get her pain control.  She says she was feeling better but now has recurrence.  No change in pain medication. Encouraged to continue using the Dilaudid  PCA.  Adjust her oral medications Anemia of chronic disease: Continue monitoring H&H. Chronic pain syndrome: Continue home regimen PTSD with anxiety: Continue antianxiety medications. GERD: Continue PPIs Staph aureus bacteremia: Probably from line infection.  Currently on vancomycin  and cefepime .  Will get TTE.  Follow culture results.  Infectious disease consult.  Code Status: Full code Family Communication: No family at bedside Disposition Plan: Home  [facility]  Pager [phone]. If 7PM-7AM, please contact night-coverage.  01/22/2024, 11:24 AM  LOS: 7 days   Brief narrative: Bailey Reese is a 18 y.o. female, with medical history significant of sickle cell disease, chronic pain, history of hemorrhagic stroke in 2017, asthma, PTSD, depression, anxiety, GERD presenting from [facility] ED due to concern for sickle cell pain crisis.  Patient has been on admission with sickle cell pain crisis.  Now she is get  bacteremia  Consultants: None  Procedures: Chest x-ray  Antibiotics: None  HPI/Subjective: Patient i has positive blood cultures with staph aureus.  Fever is improved.  She is still tachycardic.  Probably in line related infection.  Objective: Vitals:   01/22/24 0307 01/22/24 0638 01/22/24 0721 01/22/24 1025  BP:  115/61  117/69  Pulse:  (!) 109  (!) 111  Resp: 14 18 19 17   Temp:  98.5 F (36.9 C)  98.8 F (37.1 C)  TempSrc:  Oral  Oral  SpO2:  98%  97%  Weight:      Height:       Weight change:   Intake/Output Summary (Last 24 hours) at 01/22/2024 1124 Last data filed at 01/22/2024 0356 Gross per 24 hour  Intake 346.76 ml  Output 500 ml  Net -153.24 ml    General: Alert, awake, oriented x3, in no acute distress.  HEENT: [place]/AT PEERL, EOMI Neck: Trachea midline,  no masses, no thyromegal,y no JVD, no carotid bruit OROPHARYNX:  Moist, No exudate/ erythema/lesions.  Heart: Regular rate and rhythm, without murmurs, rubs, gallops, PMI non-displaced, no heaves or thrills on palpation.  Lungs: Clear to auscultation, no wheezing or rhonchi noted. No increased vocal fremitus resonant to percussion  Abdomen: Soft, nontender, nondistended, positive bowel sounds, no masses no hepatosplenomegaly noted..  Neuro: No focal neurological deficits noted cranial nerves II through XII grossly intact. DTRs 2+ bilaterally upper and lower extremities. Strength 5 out of 5 in bilateral upper and lower extremities. Musculoskeletal: No warm swelling or erythema around joints, no spinal tenderness noted. Psychiatric: Patient alert and oriented x3, good insight and cognition, good recent to remote recall.  Lymph node survey: No cervical axillary or inguinal lymphadenopathy noted.   Data Reviewed: Basic Metabolic Panel: Recent Labs  Lab 01/15/24 1932 01/20/24 1349 01/22/24 0512  NA  --  137  --   K  --  4.6  --   CL  --  101  --   CO2  --  23  --   GLUCOSE  --  94  --   BUN  --  10  --    CREATININE 0.47 0.61 0.83  CALCIUM  --  9.8  --    Liver Function Tests: Recent Labs  Lab 01/20/24 1349  AST 94*  ALT 109*  ALKPHOS 168*  BILITOT 7.9*  PROT 8.0  ALBUMIN 4.4   No results for input(s): LIPASE, AMYLASE in the last 168 hours.  No results for input(s): AMMONIA in the last 168 hours. CBC: Recent Labs  Lab 01/17/24 0558 01/18/24 0604 01/20/24 1349 01/21/24 0003 01/22/24 0512  WBC 11.7* 13.8* 14.0* 15.6* 12.7*  NEUTROABS  --   --  9.6*  --   --   HGB 7.5* 8.0* 8.0* 7.3* 7.0*  HCT 23.0* 24.7* 24.4* 21.8* 21.4*  MCV 100.4* 104.7* 101.2* 104.8* 104.9*  PLT 444* 440* 450* 375 364   Cardiac Enzymes: No results for input(s): CKTOTAL, CKMB, CKMBINDEX, TROPONINI in the last 168 hours. BNP (last 3 results) Recent Labs    12/01/23 2047  BNP 22.4    ProBNP (last 3 results) No results for input(s): PROBNP in the last 8760 hours.  CBG: No results for input(s): GLUCAP in the last 168 hours.  Recent Results (from the past 240 hours)  Culture, blood (Routine X 2) w Reflex to ID Panel     Status: Abnormal (Preliminary result)   Collection Time: 01/21/24 12:03 AM   Specimen: BLOOD LEFT ARM  Result Value Ref Range Status   Specimen Description   Final    BLOOD LEFT ARM Performed at [facility], [address], [place], [place] [address]    Special Requests   Final    BOTTLES DRAWN AEROBIC AND ANAEROBIC Blood Culture results may not be optimal due to an inadequate volume of blood received in culture bottles Performed at [facility], [address], [place], [place] [address]    Culture  Setup Time   Final    GRAM POSITIVE COCCI IN BOTH AEROBIC AND ANAEROBIC BOTTLES CRITICAL RESULT CALLED TO, READ BACK BY AND VERIFIED WITH: PHARMD Bailey L. [id number] AT 2056, ADC    Culture (A)  Final    STAPHYLOCOCCUS AUREUS SUSCEPTIBILITIES TO FOLLOW Performed at [facility], [address] [place], [place] [address]     Report Status PENDING  Incomplete  Culture, blood (Routine X 2) w Reflex to ID Panel     Status: Abnormal (Preliminary result)   Collection Time: 01/21/24 12:03 AM   Specimen: BLOOD LEFT HAND  Result Value Ref Range Status   Specimen Description   Final    BLOOD LEFT HAND Performed at [facility], [address], [place], [place] [address]    Special Requests   Final    BOTTLES DRAWN AEROBIC AND ANAEROBIC Blood Culture results may not be optimal due to an inadequate volume of blood received in culture bottles Performed at [facility], [address], [place], [place] [address]    Culture  Setup Time   Final    GRAM POSITIVE COCCI IN CLUSTERS IN BOTH  AEROBIC AND ANAEROBIC BOTTLES CRITICAL VALUE NOTED.  VALUE IS CONSISTENT WITH PREVIOUSLY REPORTED AND CALLED VALUE. Performed at [facility], [address] [place], [place] [address]    Culture STAPHYLOCOCCUS AUREUS (A)  Final   Report Status PENDING  Incomplete  Blood Culture ID Panel (Reflexed)     Status: Abnormal   Collection Time: 01/21/24 12:03 AM  Result Value Ref Range Status   Enterococcus faecalis NOT DETECTED NOT DETECTED Final   Enterococcus Faecium NOT DETECTED NOT DETECTED Final   Listeria monocytogenes NOT DETECTED NOT DETECTED Final   Staphylococcus species DETECTED (A) NOT DETECTED Final    Comment: CRITICAL RESULT CALLED TO, READ BACK BY AND VERIFIED WITH: PHARMD Bailey L. [id number] AT 2056, ADC    Staphylococcus aureus (BCID) DETECTED (A) NOT DETECTED Final    Comment: CRITICAL RESULT CALLED TO, READ BACK BY AND VERIFIED WITH: PHARMD Bailey L. [id number] AT 2056, ADC    Staphylococcus epidermidis NOT DETECTED NOT DETECTED Final   Staphylococcus lugdunensis NOT DETECTED NOT DETECTED Final   Streptococcus species NOT DETECTED NOT DETECTED Final   Streptococcus agalactiae NOT DETECTED NOT DETECTED Final   Streptococcus pneumoniae NOT DETECTED NOT DETECTED Final   Streptococcus pyogenes  NOT DETECTED NOT DETECTED Final   A.calcoaceticus-baumannii NOT DETECTED NOT DETECTED Final   Bacteroides fragilis NOT DETECTED NOT DETECTED Final   Enterobacterales NOT DETECTED NOT DETECTED Final   Enterobacter cloacae complex NOT DETECTED NOT DETECTED Final   Escherichia coli NOT DETECTED NOT DETECTED Final   Klebsiella aerogenes NOT DETECTED NOT DETECTED Final   Klebsiella oxytoca NOT DETECTED NOT DETECTED Final   Klebsiella pneumoniae NOT DETECTED NOT DETECTED Final   Proteus species NOT DETECTED NOT DETECTED Final   Salmonella species NOT DETECTED NOT DETECTED Final   Serratia marcescens NOT DETECTED NOT DETECTED Final   Haemophilus influenzae NOT DETECTED NOT DETECTED Final   Neisseria meningitidis NOT DETECTED NOT DETECTED Final   Pseudomonas aeruginosa NOT DETECTED NOT DETECTED Final   Stenotrophomonas maltophilia NOT DETECTED NOT DETECTED Final   Candida albicans NOT DETECTED NOT DETECTED Final   Candida auris NOT DETECTED NOT DETECTED Final   Candida glabrata NOT DETECTED NOT DETECTED Final   Candida krusei NOT DETECTED NOT DETECTED Final   Candida parapsilosis NOT DETECTED NOT DETECTED Final   Candida tropicalis NOT DETECTED NOT DETECTED Final   Cryptococcus neoformans/gattii NOT DETECTED NOT DETECTED Final   Meth resistant mecA/C and MREJ NOT DETECTED NOT DETECTED Final    Comment: Performed at [facility], [address], [place], [place] [address]  Respiratory (~20 pathogens) panel by PCR     Status: None   Collection Time: 01/21/24  7:19 AM   Specimen: Nasopharyngeal Swab; Respiratory  Result Value Ref Range Status   Adenovirus NOT DETECTED NOT DETECTED Final   Coronavirus 229E NOT DETECTED NOT DETECTED Final    Comment: (NOTE) The Coronavirus on the Respiratory Panel, DOES NOT test for the novel  Coronavirus (2019 nCoV)    Coronavirus HKU1 NOT DETECTED NOT DETECTED Final   Coronavirus NL63 NOT DETECTED NOT DETECTED Final   Coronavirus OC43 NOT DETECTED NOT  DETECTED Final   Metapneumovirus NOT DETECTED NOT DETECTED Final   Rhinovirus / Enterovirus NOT DETECTED NOT DETECTED Final   Influenza A NOT DETECTED NOT DETECTED Final   Influenza B NOT DETECTED NOT DETECTED Final   Parainfluenza Virus 1 NOT DETECTED NOT DETECTED Final   Parainfluenza Virus 2 NOT DETECTED NOT DETECTED Final  Parainfluenza Virus 3 NOT DETECTED NOT DETECTED Final   Parainfluenza Virus 4 NOT DETECTED NOT DETECTED Final   Respiratory Syncytial Virus NOT DETECTED NOT DETECTED Final   Bordetella pertussis NOT DETECTED NOT DETECTED Final   Bordetella Parapertussis NOT DETECTED NOT DETECTED Final   Chlamydophila pneumoniae NOT DETECTED NOT DETECTED Final   Mycoplasma pneumoniae NOT DETECTED NOT DETECTED Final    Comment: Performed at [facility], [address], [place], [place] [address]     Studies: DG Chest Port 1 View Result Date: 01/21/2024 CLINICAL DATA:  Fever and sepsis EXAM: PORTABLE CHEST 1 VIEW COMPARISON:  01/15/2024 FINDINGS: Cardiopericardial silhouette is at upper limits of normal for size. Streaky opacity in the left base suggest atelectasis. No dense focal airspace consolidation. No pulmonary edema or pleural effusion. No acute bony abnormality. IMPRESSION: Streaky opacity in the left base suggests atelectasis. Electronically Signed   By: Camellia Candle M.D.   On: 01/21/2024 06:26   DG Chest 2 View Result Date: 01/15/2024 CLINICAL DATA:  18 year old female with sickle cell disease and chest pain. EXAM: CHEST - 2 VIEW COMPARISON:  CTA chest 12/11/2023. Chest radiographs 01/05/2024 and earlier. FINDINGS: AP and lateral views of the chest 0820 hours. Lower lung volumes. Stable mild cardiomegaly. Other mediastinal contours are within normal limits. Visualized tracheal air column is within normal limits. No pneumothorax, pleural effusion, pulmonary edema, confluent lung opacity. Stable lung markings. Stable visualized osseous structures. Intermittent H-shaped  vertebral body deformities better demonstrated by CTA. Negative visible bowel gas. IMPRESSION: Lower lung volumes. No acute cardiopulmonary abnormality. Sequelae of sickle cell disease. Electronically Signed   By: VEAR Hurst M.D.   On: 01/15/2024 08:39   DG Chest 2 View Result Date: 01/05/2024 CLINICAL DATA:  Shortness of breath. EXAM: CHEST - 2 VIEW COMPARISON:  None Available. FINDINGS: The heart size and mediastinal contours are within normal limits. Both lungs are clear. The visualized skeletal structures are unremarkable. IMPRESSION: No active cardiopulmonary disease. Electronically Signed   By: Lynwood Landy Raddle M.D.   On: 01/05/2024 08:06   MR BRAIN W WO CONTRAST Result Date: 01/02/2024 EXAM: MRI BRAIN WITH AND WITHOUT CONTRAST 01/02/2024 03:45:45 PM TECHNIQUE: Multiplanar multisequence MRI of the head/brain was performed with and without the administration of intravenous contrast. 7mL gadavist  (gadobutrol  1 mmol/mL). COMPARISON: CT head 12/31/2023. CLINICAL HISTORY: Seizure, new-onset, no history of trauma. FINDINGS: BRAIN AND VENTRICLES: No acute infarct. No acute intracranial hemorrhage. No mass effect or midline shift. The ventricles are normal in size. The cerebellar tonsils extend 18 mm below the foramen magnum and are compressed. The CSF spaces at the foramen magnum are effaced. No brain sagging. The brain is normal in signal, and no abnormal enhancement is identified . Dedicated thin section imaging through the temporal lobes demonstrates normal volume and signal of the hippocampi. There is no evidence of heterotopia or cortical dysplasia. ORBITS: No acute abnormality. SINUSES: No acute abnormality. BONES AND SOFT TISSUES: Diffusely diminished bone marrow T1 signal intensity, nonspecific but compatible with the history of sickle cell disease. IMPRESSION: 1. Chiari I malformation. 2. Otherwise negative brain MRI. Electronically signed by: Dasie Hamburg MD 01/02/2024 04:29 PM EDT RP Workstation:  [id number]   EEG adult Result Date: 01/01/2024 Shelton Arlin KIDD, MD     01/01/2024  8:44 PM Patient Name: Bailey Reese MRN: [id number] Epilepsy Attending: Arlin KIDD Shelton Referring Physician/Provider: Alfornia Madison, MD Date: 01/01/2024 Duration: 27.29 mins Patient history: 18 y.o. female with possible seizure-like activity. EEG to evaluate  for seizure. Level of alertness: Awake AEDs during EEG study: None Technical aspects: This EEG study was done with scalp electrodes positioned according to the 10-20 International system of electrode placement. Electrical activity was reviewed with band pass filter of 1-70Hz , sensitivity of 7 uV/mm, display speed of 59mm/sec with a 60Hz  notched filter applied as appropriate. EEG data were recorded continuously and digitally stored.  Video monitoring was available and reviewed as appropriate. Description: Per EEG tech, could not get all electrodes under 10 ohms due to condition of pts scalp. O1 P3 Pz P4 C3 and Cz will not reach scalp despite multiple attempts. Hyperventilation and photic stimulation were not performed.   IMPRESSION: This study was not interpretable due to significant electrode artifact. Arlin MALVA Krebs   DG Chest 2 View Result Date: 12/31/2023 EXAM: 2 VIEW(S) XRAY OF THE CHEST 12/31/2023 10:02:00 PM COMPARISON: None available. CLINICAL HISTORY: Evaluate for acute chest, sickle cell. Sickle cell. FINDINGS: LUNGS AND PLEURA: No focal pulmonary opacity. No pulmonary edema. No pleural effusion. No pneumothorax. HEART AND MEDIASTINUM: No acute abnormality of the cardiac and mediastinal silhouettes. BONES AND SOFT TISSUES: No acute osseous abnormality. IMPRESSION: 1. No acute process. Electronically signed by: Franky Stanford MD 12/31/2023 10:12 PM EDT RP Workstation: [id number]   CT Head Wo Contrast Result Date: 12/31/2023 EXAM: CT HEAD WITHOUT CONTRAST 12/31/2023 10:07:18 PM TECHNIQUE: CT of the head was performed without the administration of  intravenous contrast. Automated exposure control, iterative reconstruction, and/or weight based adjustment of the mA/kV was utilized to reduce the radiation dose to as low as reasonably achievable. COMPARISON: 03/02/2017 CLINICAL HISTORY: Seizure, sickle cell, history of intracerebral hemorrhage (ICH). Patient brought in by EMS for seizure. Patient was at church and not acting normal. Patient was described to be having a seizure by mother. The patient has a history of seizures, sickle cell, and aneurysms causing strokes per EMS. Patient crying and unable to console at triage. FINDINGS: BRAIN AND VENTRICLES: No acute hemorrhage. Gray-white differentiation is preserved. No hydrocephalus. No extra-axial collection. No mass effect or midline shift. ORBITS: No acute abnormality. SINUSES: No acute abnormality. SOFT TISSUES AND SKULL: No acute soft tissue abnormality. No skull fracture. Cerebellar tonsils extend 12 mm below the foramen magnum consistent with Chiari malformation. IMPRESSION: 1. No acute intracranial abnormality. 2. 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 Electronically signed by: Franky Stanford MD 12/31/2023 10:11 PM EDT RP Workstation: [id number]   DG Chest 2 View Result Date: 12/26/2023 EXAM: 2 VIEW(S) XRAY OF THE CHEST 12/26/2023 94:71:71 AM COMPARISON: 07/13/2023 CLINICAL HISTORY: ?acute chest - whole body SSD pain, but with cough and chest pain too. PER ER NOTE; Pt to ED via EMS from home, pt reports pain all over body that is throbbing, pt has hx sickle cell. Pt reports she was seen here for same 2 weeks ago. FINDINGS: LUNGS AND PLEURA: No focal pulmonary opacity. No pulmonary edema. No pleural effusion. No pneumothorax. HEART AND MEDIASTINUM: No acute abnormality of the cardiac and mediastinal silhouettes. BONES AND SOFT TISSUES: No acute osseous abnormality. IMPRESSION: 1. No acute process. Electronically signed by: Waddell Calk MD 12/26/2023 06:56  AM EDT RP Workstation: [id number]    Scheduled Meds:  Deferasirox   360 mg Oral BID   enoxaparin  (LOVENOX ) injection  40 mg Subcutaneous Q24H   HYDROmorphone    Intravenous Q4H   hydroxyurea   1,500 mg Oral Daily   hydrOXYzine   25 mg Oral BID   pantoprazole   40 mg Oral Daily  prazosin   1 mg Oral QHS   pregabalin   150 mg Oral TID   senna-docusate  1 tablet Oral BID   Continuous Infusions:  piperacillin -tazobactam (ZOSYN )  IV 3.375 g (01/22/24 0606)   vancomycin  Stopped (01/22/24 0406)    Principal Problem:   Sickle cell anemia with pain (HCC) Active Problems:   Sickle cell pain crisis (HCC)   Chronic pain syndrome   Leukocytosis   GAD (generalized anxiety disorder)

## 2024-01-23 ENCOUNTER — Inpatient Hospital Stay (HOSPITAL_COMMUNITY)

## 2024-01-23 DIAGNOSIS — R7881 Bacteremia: Secondary | ICD-10-CM | POA: Diagnosis not present

## 2024-01-23 DIAGNOSIS — D5700 Hb-SS disease with crisis, unspecified: Secondary | ICD-10-CM | POA: Diagnosis not present

## 2024-01-23 LAB — CBC
HCT: 19.1 % — ABNORMAL LOW (ref 36.0–46.0)
Hemoglobin: 6.2 g/dL — CL (ref 12.0–15.0)
MCH: 33.9 pg (ref 26.0–34.0)
MCHC: 32.5 g/dL (ref 30.0–36.0)
MCV: 104.4 fL — ABNORMAL HIGH (ref 80.0–100.0)
Platelets: 343 K/uL (ref 150–400)
RBC: 1.83 MIL/uL — ABNORMAL LOW (ref 3.87–5.11)
RDW: 22.1 % — ABNORMAL HIGH (ref 11.5–15.5)
WBC: 10.5 K/uL (ref 4.0–10.5)
nRBC: 4.8 % — ABNORMAL HIGH (ref 0.0–0.2)

## 2024-01-23 LAB — COMPREHENSIVE METABOLIC PANEL WITH GFR
ALT: 74 U/L — ABNORMAL HIGH (ref 0–44)
AST: 63 U/L — ABNORMAL HIGH (ref 15–41)
Albumin: 3.7 g/dL (ref 3.5–5.0)
Alkaline Phosphatase: 154 U/L — ABNORMAL HIGH (ref 38–126)
Anion gap: 11 (ref 5–15)
BUN: 12 mg/dL (ref 6–20)
CO2: 22 mmol/L (ref 22–32)
Calcium: 9.1 mg/dL (ref 8.9–10.3)
Chloride: 104 mmol/L (ref 98–111)
Creatinine, Ser: 0.5 mg/dL (ref 0.44–1.00)
GFR, Estimated: >60 mL/min (ref >60)
Glucose, Bld: 99 mg/dL (ref 70–99)
Potassium: 4.4 mmol/L (ref 3.5–5.1)
Sodium: 137 mmol/L (ref 135–145)
Total Bilirubin: 3.5 mg/dL — ABNORMAL HIGH (ref 0.0–1.2)
Total Protein: 7.2 g/dL (ref 6.5–8.1)

## 2024-01-23 LAB — CULTURE, BLOOD (ROUTINE X 2)

## 2024-01-23 LAB — ECHOCARDIOGRAM COMPLETE
AR max vel: 2.51 cm2
AV Area VTI: 2.57 cm2
AV Area mean vel: 2.43 cm2
AV Mean grad: 6 mmHg
AV Peak grad: 10.6 mmHg
Ao pk vel: 1.63 m/s
Height: 64 in
S' Lateral: 3.1 cm
Weight: 2292.78 [oz_av]

## 2024-01-23 LAB — PREPARE RBC (CROSSMATCH)

## 2024-01-23 LAB — CREATININE, SERUM
Creatinine, Ser: 0.55 mg/dL (ref 0.44–1.00)
GFR, Estimated: >60 mL/min (ref >60)

## 2024-01-23 MED ORDER — SODIUM CHLORIDE 0.9% IV SOLUTION: Freq: Once | INTRAVENOUS | Status: AC

## 2024-01-23 NOTE — Plan of Care (Signed)

## 2024-01-23 NOTE — Plan of Care (Signed)
  Problem: Activity: Goal: Risk for activity intolerance will decrease Outcome: Progressing   Problem: Nutrition: Goal: Adequate nutrition will be maintained Outcome: Progressing   Problem: Coping: Goal: Level of anxiety will decrease Outcome: Progressing   Problem: Skin Integrity: Goal: Risk for impaired skin integrity will decrease Outcome: Progressing   Problem: Education: Goal: Awareness of infection prevention will improve Outcome: Progressing

## 2024-01-23 NOTE — Progress Notes (Signed)
 SICKLE CELL SERVICE PROGRESS NOTE  TNIA ANGLADA FMW:[id number] DOB: 2006/04/13 DOA: 01/15/2024 PCP: Morgan Slater Pizza, MD  Assessment/Plan: Principal Problem:   Sickle cell anemia with pain (HCC) Active Problems:   Sickle cell pain crisis (HCC)   Chronic pain syndrome   Leukocytosis   GAD (generalized anxiety disorder)  Sepsis: Patient has bacteremia secondary to MSSA.   Appears to be infection related to her line.  She had an peripheral IV line that apparently was oozing some pus.  Patient has been empirically on vancomycin  and cefepime .  Fever has resolved overnight.   Sickle cell pain crisis: Patient is still complaining of pain despite being on Dilaudid  PCA, Toradol  and oral medications.  She has had chronic pain and difficult to get her pain control.  She says she was feeling better but now has recurrence.  No change in pain medication. Encouraged to continue using the Dilaudid  PCA.  Adjust her oral medications Anemia of chronic disease: Hemoglobin has dropped down to 6.2 today.  Probably in the setting of bacteremia and sepsis.  We will transfuse 1 unit of packed red blood cells. Continue monitoring H&H. Chronic pain syndrome: Continue home regimen PTSD with anxiety: Continue antianxiety medications. GERD: Continue PPIs Staph aureus bacteremia: Probably from line infection.  Currently on Ancef  per infectious disease.  Will get TTE.  Follow culture results.  Infectious disease to drive for further treatment.  Code Status: Full code Family Communication: No family at bedside Disposition Plan: Home  [facility]  Pager [phone]. If 7PM-7AM, please contact night-coverage.  01/23/2024, 11:53 AM  LOS: 8 days   Brief narrative: Bailey Reese is a 18 y.o. female, with medical history significant of sickle cell disease, chronic pain, history of hemorrhagic stroke in 2017, asthma, PTSD, depression, anxiety, GERD presenting from [facility] ED due to concern for sickle cell pain crisis.   Patient has been on admission with sickle cell pain crisis.  Now she is get bacteremia  Consultants: None  Procedures: Chest x-ray  Antibiotics: None  HPI/Subjective: Patient remains afebrile.  White count is improving, back to normal however patient's hemoglobin dropped to 6.2 today.  TTE shows no vegetation Objective: Vitals:   01/23/24 0439 01/23/24 0957 01/23/24 1001 01/23/24 1024  BP:   102/68   Pulse:   92   Resp: 15 15 15 12   Temp:   98.8 F (37.1 C)   TempSrc:   Oral   SpO2: 95%  98%   Weight:      Height:       Weight change:   Intake/Output Summary (Last 24 hours) at 01/23/2024 1153 Last data filed at 01/22/2024 1525 Gross per 24 hour  Intake 87.47 ml  Output --  Net 87.47 ml    General: Alert, awake, oriented x3, in no acute distress.  HEENT: Brown/AT PEERL, EOMI Neck: Trachea midline,  no masses, no thyromegal,y no JVD, no carotid bruit OROPHARYNX:  Moist, No exudate/ erythema/lesions.  Heart: Regular rate and rhythm, without murmurs, rubs, gallops, PMI non-displaced, no heaves or thrills on palpation.  Lungs: Clear to auscultation, no wheezing or rhonchi noted. No increased vocal fremitus resonant to percussion  Abdomen: Soft, nontender, nondistended, positive bowel sounds, no masses no hepatosplenomegaly noted..  Neuro: No focal neurological deficits noted cranial nerves II through XII grossly intact. DTRs 2+ bilaterally upper and lower extremities. Strength 5 out of 5 in bilateral upper and lower extremities. Musculoskeletal: No warm swelling or erythema around joints, no spinal tenderness noted. Psychiatric: Patient alert  and oriented x3, good insight and cognition, good recent to remote recall. Lymph node survey: No cervical axillary or inguinal lymphadenopathy noted.   Data Reviewed: Basic Metabolic Panel: Recent Labs  Lab 01/20/24 1349 01/22/24 0512 01/23/24 0554 01/23/24 0849  NA 137  --   --  137  K 4.6  --   --  4.4  CL 101  --   --  104  CO2  23  --   --  22  GLUCOSE 94  --   --  99  BUN 10  --   --  12  CREATININE 0.61 0.83 0.55 0.50  CALCIUM 9.8  --   --  9.1   Liver Function Tests: Recent Labs  Lab 01/20/24 1349 01/23/24 0849  AST 94* 63*  ALT 109* 74*  ALKPHOS 168* 154*  BILITOT 7.9* 3.5*  PROT 8.0 7.2  ALBUMIN 4.4 3.7   No results for input(s): LIPASE, AMYLASE in the last 168 hours.  No results for input(s): AMMONIA in the last 168 hours. CBC: Recent Labs  Lab 01/18/24 0604 01/20/24 1349 01/21/24 0003 01/22/24 0512 01/23/24 0849  WBC 13.8* 14.0* 15.6* 12.7* 10.5  NEUTROABS  --  9.6*  --   --   --   HGB 8.0* 8.0* 7.3* 7.0* 6.2*  HCT 24.7* 24.4* 21.8* 21.4* 19.1*  MCV 104.7* 101.2* 104.8* 104.9* 104.4*  PLT 440* 450* 375 364 343   Cardiac Enzymes: No results for input(s): CKTOTAL, CKMB, CKMBINDEX, TROPONINI in the last 168 hours. BNP (last 3 results) Recent Labs    12/01/23 2047  BNP 22.4    ProBNP (last 3 results) No results for input(s): PROBNP in the last 8760 hours.  CBG: No results for input(s): GLUCAP in the last 168 hours.  Recent Results (from the past 240 hours)  Culture, blood (Routine X 2) w Reflex to ID Panel     Status: Abnormal   Collection Time: 01/21/24 12:03 AM   Specimen: BLOOD LEFT ARM  Result Value Ref Range Status   Specimen Description   Final    BLOOD LEFT ARM Performed at [facility], [address], [place], [place] [address]    Special Requests   Final    BOTTLES DRAWN AEROBIC AND ANAEROBIC Blood Culture results may not be optimal due to an inadequate volume of blood received in culture bottles Performed at [facility], [address], [place], [place] [address]    Culture  Setup Time   Final    GRAM POSITIVE COCCI IN BOTH AEROBIC AND ANAEROBIC BOTTLES CRITICAL RESULT CALLED TO, READ BACK BY AND VERIFIED WITH: PHARMD JUSTIN FREDRIK [id number] AT 2056, ADC Performed at [facility], [address], [place], [place]  [address]    Culture STAPHYLOCOCCUS AUREUS (A)  Final   Report Status 01/23/2024 FINAL  Final   Organism ID, Bacteria STAPHYLOCOCCUS AUREUS  Final      Susceptibility   Staphylococcus aureus - MIC*    CIPROFLOXACIN <=0.5 SENSITIVE Sensitive     ERYTHROMYCIN >=8 RESISTANT Resistant     GENTAMICIN <=0.5 SENSITIVE Sensitive     OXACILLIN <=0.25 SENSITIVE Sensitive     TETRACYCLINE <=1 SENSITIVE Sensitive     VANCOMYCIN  1 SENSITIVE Sensitive     TRIMETH/SULFA <=10 SENSITIVE Sensitive     CLINDAMYCIN RESISTANT Resistant     RIFAMPIN <=0.5 SENSITIVE Sensitive     Inducible Clindamycin POSITIVE Resistant     LINEZOLID  2 SENSITIVE Sensitive     *  STAPHYLOCOCCUS AUREUS  Culture, blood (Routine X 2) w Reflex to ID Panel     Status: Abnormal   Collection Time: 01/21/24 12:03 AM   Specimen: BLOOD LEFT HAND  Result Value Ref Range Status   Specimen Description   Final    BLOOD LEFT HAND Performed at [facility], [address], [place], [place] [address]    Special Requests   Final    BOTTLES DRAWN AEROBIC AND ANAEROBIC Blood Culture results may not be optimal due to an inadequate volume of blood received in culture bottles Performed at [facility], [address], [place], [place] [address]    Culture  Setup Time   Final    GRAM POSITIVE COCCI IN CLUSTERS IN BOTH AEROBIC AND ANAEROBIC BOTTLES CRITICAL VALUE NOTED.  VALUE IS CONSISTENT WITH PREVIOUSLY REPORTED AND CALLED VALUE.    Culture (A)  Final    STAPHYLOCOCCUS AUREUS SUSCEPTIBILITIES PERFORMED ON PREVIOUS CULTURE WITHIN THE LAST 5 DAYS. Performed at [facility], [address] [place], [place] [address]    Report Status 01/23/2024 FINAL  Final  Blood Culture ID Panel (Reflexed)     Status: Abnormal   Collection Time: 01/21/24 12:03 AM  Result Value Ref Range Status   Enterococcus faecalis NOT DETECTED NOT DETECTED Final   Enterococcus Faecium NOT DETECTED NOT DETECTED Final   Listeria  monocytogenes NOT DETECTED NOT DETECTED Final   Staphylococcus species DETECTED (A) NOT DETECTED Final    Comment: CRITICAL RESULT CALLED TO, READ BACK BY AND VERIFIED WITH: PHARMD JUSTIN L. [id number] AT 2056, ADC    Staphylococcus aureus (BCID) DETECTED (A) NOT DETECTED Final    Comment: CRITICAL RESULT CALLED TO, READ BACK BY AND VERIFIED WITH: PHARMD JUSTIN L. [id number] AT 2056, ADC    Staphylococcus epidermidis NOT DETECTED NOT DETECTED Final   Staphylococcus lugdunensis NOT DETECTED NOT DETECTED Final   Streptococcus species NOT DETECTED NOT DETECTED Final   Streptococcus agalactiae NOT DETECTED NOT DETECTED Final   Streptococcus pneumoniae NOT DETECTED NOT DETECTED Final   Streptococcus pyogenes NOT DETECTED NOT DETECTED Final   A.calcoaceticus-baumannii NOT DETECTED NOT DETECTED Final   Bacteroides fragilis NOT DETECTED NOT DETECTED Final   Enterobacterales NOT DETECTED NOT DETECTED Final   Enterobacter cloacae complex NOT DETECTED NOT DETECTED Final   Escherichia coli NOT DETECTED NOT DETECTED Final   Klebsiella aerogenes NOT DETECTED NOT DETECTED Final   Klebsiella oxytoca NOT DETECTED NOT DETECTED Final   Klebsiella pneumoniae NOT DETECTED NOT DETECTED Final   Proteus species NOT DETECTED NOT DETECTED Final   Salmonella species NOT DETECTED NOT DETECTED Final   Serratia marcescens NOT DETECTED NOT DETECTED Final   Haemophilus influenzae NOT DETECTED NOT DETECTED Final   Neisseria meningitidis NOT DETECTED NOT DETECTED Final   Pseudomonas aeruginosa NOT DETECTED NOT DETECTED Final   Stenotrophomonas maltophilia NOT DETECTED NOT DETECTED Final   Candida albicans NOT DETECTED NOT DETECTED Final   Candida auris NOT DETECTED NOT DETECTED Final   Candida glabrata NOT DETECTED NOT DETECTED Final   Candida krusei NOT DETECTED NOT DETECTED Final   Candida parapsilosis NOT DETECTED NOT DETECTED Final   Candida tropicalis NOT DETECTED NOT DETECTED Final   Cryptococcus neoformans/gattii  NOT DETECTED NOT DETECTED Final   Meth resistant mecA/C and MREJ NOT DETECTED NOT DETECTED Final    Comment: Performed at [facility], [address] [place], [place] [address]  Respiratory (~20 pathogens) panel by PCR  Status: None   Collection Time: 01/21/24  7:19 AM   Specimen: Nasopharyngeal Swab; Respiratory  Result Value Ref Range Status   Adenovirus NOT DETECTED NOT DETECTED Final   Coronavirus 229E NOT DETECTED NOT DETECTED Final    Comment: (NOTE) The Coronavirus on the Respiratory Panel, DOES NOT test for the novel  Coronavirus (2019 nCoV)    Coronavirus HKU1 NOT DETECTED NOT DETECTED Final   Coronavirus NL63 NOT DETECTED NOT DETECTED Final   Coronavirus OC43 NOT DETECTED NOT DETECTED Final   Metapneumovirus NOT DETECTED NOT DETECTED Final   Rhinovirus / Enterovirus NOT DETECTED NOT DETECTED Final   Influenza A NOT DETECTED NOT DETECTED Final   Influenza B NOT DETECTED NOT DETECTED Final   Parainfluenza Virus 1 NOT DETECTED NOT DETECTED Final   Parainfluenza Virus 2 NOT DETECTED NOT DETECTED Final   Parainfluenza Virus 3 NOT DETECTED NOT DETECTED Final   Parainfluenza Virus 4 NOT DETECTED NOT DETECTED Final   Respiratory Syncytial Virus NOT DETECTED NOT DETECTED Final   Bordetella pertussis NOT DETECTED NOT DETECTED Final   Bordetella Parapertussis NOT DETECTED NOT DETECTED Final   Chlamydophila pneumoniae NOT DETECTED NOT DETECTED Final   Mycoplasma pneumoniae NOT DETECTED NOT DETECTED Final    Comment: Performed at [facility], [address] [place], [place] [address]  Culture, blood (Routine X 2) w Reflex to ID Panel     Status: None (Preliminary result)   Collection Time: 01/23/24  5:54 AM   Specimen: BLOOD LEFT HAND  Result Value Ref Range Status   Specimen Description   Final    BLOOD LEFT HAND Performed at [facility], [address], [place], [place] [address]    Special Requests   Final    BOTTLES DRAWN AEROBIC AND ANAEROBIC Blood  Culture results may not be optimal due to an inadequate volume of blood received in culture bottles Performed at [facility], [address], [place], [place] [address]    Culture PENDING  Incomplete   Report Status PENDING  Incomplete  Culture, blood (Routine X 2) w Reflex to ID Panel     Status: None (Preliminary result)   Collection Time: 01/23/24  6:00 AM   Specimen: BLOOD LEFT HAND  Result Value Ref Range Status   Specimen Description   Final    BLOOD LEFT HAND Performed at [facility], [address] [place], [place] [address]    Special Requests   Final    BOTTLES DRAWN AEROBIC AND ANAEROBIC Blood Culture results may not be optimal due to an inadequate volume of blood received in culture bottles Performed at [facility], [address] [place], [place] [address]    Culture PENDING  Incomplete   Report Status PENDING  Incomplete     Studies: DG Chest Port 1 View Result Date: 01/21/2024 CLINICAL DATA:  Fever and sepsis EXAM: PORTABLE CHEST 1 VIEW COMPARISON:  01/15/2024 FINDINGS: Cardiopericardial silhouette is at upper limits of normal for size. Streaky opacity in the left base suggest atelectasis. No dense focal airspace consolidation. No pulmonary edema or pleural effusion. No acute bony abnormality. IMPRESSION: Streaky opacity in the left base suggests atelectasis. Electronically Signed   By: Camellia Candle M.D.   On: 01/21/2024 06:26   DG Chest 2 View Result Date: 01/15/2024 CLINICAL DATA:  18 year old female with sickle cell disease and chest pain. EXAM: CHEST - 2 VIEW COMPARISON:  CTA chest 12/11/2023. Chest radiographs 01/05/2024 and earlier.  FINDINGS: AP and lateral views of the chest 0820 hours. Lower lung volumes. Stable mild cardiomegaly. Other mediastinal contours are within normal limits. Visualized tracheal air column is within normal limits. No pneumothorax, pleural effusion, pulmonary edema, confluent lung opacity. Stable  lung markings. Stable visualized osseous structures. Intermittent H-shaped vertebral body deformities better demonstrated by CTA. Negative visible bowel gas. IMPRESSION: Lower lung volumes. No acute cardiopulmonary abnormality. Sequelae of sickle cell disease. Electronically Signed   By: VEAR Hurst M.D.   On: 01/15/2024 08:39   DG Chest 2 View Result Date: 01/05/2024 CLINICAL DATA:  Shortness of breath. EXAM: CHEST - 2 VIEW COMPARISON:  None Available. FINDINGS: The heart size and mediastinal contours are within normal limits. Both lungs are clear. The visualized skeletal structures are unremarkable. IMPRESSION: No active cardiopulmonary disease. Electronically Signed   By: Lynwood Landy Raddle M.D.   On: 01/05/2024 08:06   MR BRAIN W WO CONTRAST Result Date: 01/02/2024 EXAM: MRI BRAIN WITH AND WITHOUT CONTRAST 01/02/2024 03:45:45 PM TECHNIQUE: Multiplanar multisequence MRI of the head/brain was performed with and without the administration of intravenous contrast. 7mL gadavist  (gadobutrol  1 mmol/mL). COMPARISON: CT head 12/31/2023. CLINICAL HISTORY: Seizure, new-onset, no history of trauma. FINDINGS: BRAIN AND VENTRICLES: No acute infarct. No acute intracranial hemorrhage. No mass effect or midline shift. The ventricles are normal in size. The cerebellar tonsils extend 18 mm below the foramen magnum and are compressed. The CSF spaces at the foramen magnum are effaced. No brain sagging. The brain is normal in signal, and no abnormal enhancement is identified . Dedicated thin section imaging through the temporal lobes demonstrates normal volume and signal of the hippocampi. There is no evidence of heterotopia or cortical dysplasia. ORBITS: No acute abnormality. SINUSES: No acute abnormality. BONES AND SOFT TISSUES: Diffusely diminished bone marrow T1 signal intensity, nonspecific but compatible with the history of sickle cell disease. IMPRESSION: 1. Chiari I malformation. 2. Otherwise negative brain MRI. Electronically  signed by: Dasie Hamburg MD 01/02/2024 04:29 PM EDT RP Workstation: [id number]   EEG adult Result Date: 01/01/2024 Shelton Arlin KIDD, MD     01/01/2024  8:44 PM Patient Name: Bailey Reese MRN: [id number] Epilepsy Attending: Arlin KIDD Shelton Referring Physician/Provider: Alfornia Madison, MD Date: 01/01/2024 Duration: 27.29 mins Patient history: 18 y.o. female with possible seizure-like activity. EEG to evaluate for seizure. Level of alertness: Awake AEDs during EEG study: None Technical aspects: This EEG study was done with scalp electrodes positioned according to the 10-20 International system of electrode placement. Electrical activity was reviewed with band pass filter of 1-70Hz , sensitivity of 7 uV/mm, display speed of 20mm/sec with a 60Hz  notched filter applied as appropriate. EEG data were recorded continuously and digitally stored.  Video monitoring was available and reviewed as appropriate. Description: Per EEG tech, could not get all electrodes under 10 ohms due to condition of pts scalp. O1 P3 Pz P4 C3 and Cz will not reach scalp despite multiple attempts. Hyperventilation and photic stimulation were not performed.   IMPRESSION: This study was not interpretable due to significant electrode artifact. Arlin KIDD Shelton   DG Chest 2 View Result Date: 12/31/2023 EXAM: 2 VIEW(S) XRAY OF THE CHEST 12/31/2023 10:02:00 PM COMPARISON: None available. CLINICAL HISTORY: Evaluate for acute chest, sickle cell. Sickle cell. FINDINGS: LUNGS AND PLEURA: No focal pulmonary opacity. No pulmonary edema. No pleural effusion. No pneumothorax. HEART AND MEDIASTINUM: No acute abnormality of the cardiac and mediastinal silhouettes. BONES AND SOFT TISSUES: No acute osseous abnormality. IMPRESSION: 1. No acute  process. Electronically signed by: Franky Stanford MD 12/31/2023 10:12 PM EDT RP Workstation: [id number]   CT Head Wo Contrast Result Date: 12/31/2023 EXAM: CT HEAD WITHOUT CONTRAST 12/31/2023 10:07:18 PM TECHNIQUE:  CT of the head was performed without the administration of intravenous contrast. Automated exposure control, iterative reconstruction, and/or weight based adjustment of the mA/kV was utilized to reduce the radiation dose to as low as reasonably achievable. COMPARISON: 03/02/2017 CLINICAL HISTORY: Seizure, sickle cell, history of intracerebral hemorrhage (ICH). Patient brought in by EMS for seizure. Patient was at church and not acting normal. Patient was described to be having a seizure by mother. The patient has a history of seizures, sickle cell, and aneurysms causing strokes per EMS. Patient crying and unable to console at triage. FINDINGS: BRAIN AND VENTRICLES: No acute hemorrhage. Gray-white differentiation is preserved. No hydrocephalus. No extra-axial collection. No mass effect or midline shift. ORBITS: No acute abnormality. SINUSES: No acute abnormality. SOFT TISSUES AND SKULL: No acute soft tissue abnormality. No skull fracture. Cerebellar tonsils extend 12 mm below the foramen magnum consistent with Chiari malformation. IMPRESSION: 1. No acute intracranial abnormality. 2. 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 Electronically signed by: Franky Stanford MD 12/31/2023 10:11 PM EDT RP Workstation: [id number]   DG Chest 2 View Result Date: 12/26/2023 EXAM: 2 VIEW(S) XRAY OF THE CHEST 12/26/2023 94:71:71 AM COMPARISON: 07/13/2023 CLINICAL HISTORY: ?acute chest - whole body SSD pain, but with cough and chest pain too. PER ER NOTE; Pt to ED via EMS from home, pt reports pain all over body that is throbbing, pt has hx sickle cell. Pt reports she was seen here for same 2 weeks ago. FINDINGS: LUNGS AND PLEURA: No focal pulmonary opacity. No pulmonary edema. No pleural effusion. No pneumothorax. HEART AND MEDIASTINUM: No acute abnormality of the cardiac and mediastinal silhouettes. BONES AND SOFT TISSUES: No acute osseous abnormality. IMPRESSION: 1. No acute process.  Electronically signed by: Taylor Stroud MD 12/26/2023 06:56 AM EDT RP Workstation: [id number]    Scheduled Meds:  sodium chloride    Intravenous Once   Deferasirox   360 mg Oral BID   enoxaparin  (LOVENOX ) injection  40 mg Subcutaneous Q24H   HYDROmorphone    Intravenous Q4H   hydroxyurea   1,500 mg Oral Daily   hydrOXYzine   25 mg Oral BID   pantoprazole   40 mg Oral Daily   prazosin   1 mg Oral QHS   pregabalin   150 mg Oral TID   senna-docusate  1 tablet Oral BID   Continuous Infusions:   ceFAZolin  (ANCEF ) IV 2 g (01/23/24 0600)    Principal Problem:   Sickle cell anemia with pain (HCC) Active Problems:   Sickle cell pain crisis (HCC)   Chronic pain syndrome   Leukocytosis   GAD (generalized anxiety disorder)

## 2024-01-24 ENCOUNTER — Other Ambulatory Visit (HOSPITAL_COMMUNITY): Payer: Self-pay

## 2024-01-24 ENCOUNTER — Telehealth (HOSPITAL_COMMUNITY): Payer: Self-pay | Admitting: Pharmacy Technician

## 2024-01-24 DIAGNOSIS — D5700 Hb-SS disease with crisis, unspecified: Secondary | ICD-10-CM | POA: Diagnosis not present

## 2024-01-24 DIAGNOSIS — R7881 Bacteremia: Secondary | ICD-10-CM

## 2024-01-24 DIAGNOSIS — I808 Phlebitis and thrombophlebitis of other sites: Secondary | ICD-10-CM | POA: Diagnosis not present

## 2024-01-24 DIAGNOSIS — D57219 Sickle-cell/Hb-C disease with crisis, unspecified: Secondary | ICD-10-CM | POA: Diagnosis not present

## 2024-01-24 DIAGNOSIS — B9561 Methicillin susceptible Staphylococcus aureus infection as the cause of diseases classified elsewhere: Secondary | ICD-10-CM

## 2024-01-24 LAB — TYPE AND SCREEN
ABO/RH(D): B POS
Antibody Screen: NEGATIVE
Unit division: 0

## 2024-01-24 LAB — CBC
HCT: 24 % — ABNORMAL LOW (ref 36.0–46.0)
Hemoglobin: 8.4 g/dL — ABNORMAL LOW (ref 12.0–15.0)
MCH: 31.6 pg (ref 26.0–34.0)
MCHC: 35 g/dL (ref 30.0–36.0)
MCV: 90.2 fL (ref 80.0–100.0)
Platelets: 408 K/uL — ABNORMAL HIGH (ref 150–400)
RBC: 2.66 MIL/uL — ABNORMAL LOW (ref 3.87–5.11)
RDW: 25.7 % — ABNORMAL HIGH (ref 11.5–15.5)
WBC: 10.2 K/uL (ref 4.0–10.5)
nRBC: 15 % — ABNORMAL HIGH (ref 0.0–0.2)

## 2024-01-24 LAB — CULTURE, BLOOD (ROUTINE X 2)

## 2024-01-24 LAB — BPAM RBC
Blood Product Expiration Date: 202510092359
ISSUE DATE / TIME: 202509091727
Unit Type and Rh: 7300

## 2024-01-24 LAB — CREATININE, SERUM
Creatinine, Ser: 0.53 mg/dL (ref 0.44–1.00)
GFR, Estimated: >60 mL/min (ref >60)

## 2024-01-24 MED ORDER — FLUCONAZOLE 150 MG PO TABS
150.0000 mg | ORAL_TABLET | Freq: Every day | ORAL | Status: AC
  Administered 2024-01-24 – 2024-01-25 (×2): 150 mg via ORAL
  Filled 2024-01-24 (×2): qty 1

## 2024-01-24 NOTE — Progress Notes (Signed)
 No acute events overnight, recd x 1 dose of prn pain med at 2200, tolerated IV ATB, voiced no other complaints overnight, pain level still remains above 7/10

## 2024-01-24 NOTE — Plan of Care (Signed)
  Problem: Education: Goal: Knowledge of General Education information will improve Description: Including pain rating scale, medication(s)/side effects and non-pharmacologic comfort measures Outcome: Progressing   Problem: Health Behavior/Discharge Planning: Goal: Ability to manage health-related needs will improve Outcome: Progressing   Problem: Clinical Measurements: Goal: Diagnostic test results will improve Outcome: Progressing   Problem: Clinical Measurements: Goal: Ability to maintain clinical measurements within normal limits will improve Outcome: Not Progressing Goal: Will remain free from infection Outcome: Not Progressing

## 2024-01-24 NOTE — Plan of Care (Signed)

## 2024-01-24 NOTE — Progress Notes (Signed)
 PHARMACY CONSULT NOTE FOR:  OUTPATIENT  PARENTERAL ANTIBIOTIC THERAPY (OPAT)  Indication: MSSA bacteremia Regimen: Cefazolin  2g IV every 8 hours End date: 02[birth year]06[birth year]  After 9/23, the patient should transition to linezolid  600 mg po every 12 hours for an additional 2 weeks thru 10/7  IV antibiotic discharge orders are pended. To discharging provider:  please sign these orders via discharge navigator,  Select New Orders & click on the button choice - Manage This Unsigned Work.     Thank you for allowing pharmacy to be a part of this patient's care.  Almarie Lunger, PharmD, BCPS, BCIDP Infectious Diseases Clinical Pharmacist 01/25/2024 2:58 PM   **Pharmacist phone directory can now be found on [email or web address] ([email or web address]).  Listed under [facility].

## 2024-01-24 NOTE — Telephone Encounter (Signed)
 Patient Product/process development scientist completed.    The patient is insured through [facility] [place].     Ran test claim for linezolid  600 mg and the current 21 day co-pay is $0.00.   This test claim was processed through [facility]- copay amounts may vary at other pharmacies due to pharmacy/plan contracts, or as the patient moves through the different stages of their insurance plan.     Reyes Sharps, CPHT Pharmacy Technician III Certified Patient Advocate [facility] Patient Advocate Team Direct Number: [phone]  Fax: [phone]

## 2024-01-24 NOTE — Progress Notes (Signed)
 [facility]  Date of Admission:  01/15/2024     Lines:  Peripheral iv's   Abx: 9/8-c cefazolin    9/7-c vanc piptazo     Assessment: 18 yo female here with sickle cell crisis, course complicated by nosocomial sepsis in setting of peripheral iv line infection with mssa bacteremia    9/7 bcx mssa 9/7 resp viral pcr negative   She has diffuse body aches/pain with sickle cell crisis and is on a pca pump. Nothing new symptomatically around time of sepsis outside of the right hand. 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 And concern for microthrombus with sickle cell crisis. Despite clear onset of sepsis timing, will plan 4 week abx treatment  -------- 9/10 id assessment Repeat bcx ngtd Tte negative Sepsis had resolved Thrombophlebitis site looking lot better  Planned 4 weeks abx     Plan:  Continue cefazolin ; plan 7-14 days iv abx and then transition to linezolid  to finish the reset of the course of 4 weeks starting 9/09 until 10/07 Id clinic f/u for lab check and clinical reevaluation on 10/2 @ 1010 If patient discharge before the 7 days from 9/09 please reengage ID to provide opat order Id will sign off Maintain standard isolation precaution Discussed with primary team   Principal Problem:   Sickle cell anemia with pain (HCC) Active Problems:   Sickle cell pain crisis (HCC)   Chronic pain syndrome   Leukocytosis   GAD (generalized anxiety disorder)   Allergies  Allergen Reactions   Cherry Anaphylaxis, Itching and Swelling   Olanzapine  Other (See Comments)    Drug-induced liver injury   Other Swelling and Other (See Comments)    Pitted fruits   Peanut-Containing Drug Products Anaphylaxis, Itching and Swelling   Plum Pulp Anaphylaxis and Hives   Droperidol  Other (See Comments)    Pt states muscle tension, involuntary movements, eye drift,  anxiety   Morphine  And Codeine Hives and Itching   Peach [Prunus Persica] Itching, Swelling and Other (See Comments)   Acetaminophen  Nausea And Vomiting   Compazine  [Prochlorperazine ] Other (See Comments) and Hypertension    Patient stated that this medication causes her HR to increase and well as BP. She also states that it cause hallucinations.   Ibuprofen  Nausea And Vomiting    Scheduled Meds:  Deferasirox   360 mg Oral BID   enoxaparin  (LOVENOX ) injection  40 mg Subcutaneous Q24H   fluconazole   150 mg Oral Daily   HYDROmorphone    Intravenous Q4H   hydroxyurea   1,500 mg Oral Daily   hydrOXYzine   25 mg Oral BID   pantoprazole   40 mg Oral Daily   prazosin   1 mg Oral QHS   pregabalin   150 mg Oral TID   senna-docusate  1 tablet Oral BID   Continuous Infusions:   ceFAZolin  (ANCEF ) IV 2 g (01/24/24 1306)   PRN Meds:.acetaminophen , diphenhydrAMINE , hydrOXYzine , naloxone  **AND** sodium chloride  flush, ondansetron  (ZOFRAN ) IV, oxyCODONE , polyethylene glycol   SUBJECTIVE: No complaint  Said feeling better but still on pca for ss crisis Afebrile Bcx repeat ngtd   Review of Systems: ROS All other ROS was negative, except mentioned above     OBJECTIVE: Vitals:   01/24/24 0952 01/24/24 0956 01/24/24 1123 01/24/24 1309  BP:  119/66  106/64  Pulse:  69  83  Resp: 16 15 16 17   Temp:  98.4 F (36.9 C)  98.1 F (36.7 C)  TempSrc:  Oral  Oral  SpO2:  100%  100%  Weight:      Height:       Body mass index is 24.6 kg/m.  Physical Exam General/constitutional: no distress, pleasant HEENT: Normocephalic, PER, Conj Clear, EOMI, Oropharynx clear Neck supple CV: rrr no mrg Lungs: clear to auscultation, normal respiratory effort Abd: Soft, Nontender Ext: no edema Skin: No Rash -- the dorsum arm site where the peripheral iv/thrombophlebitis was is almost completely unoticeable Neuro: nonfocal MSK: no peripheral joint swelling/tenderness/warmth; back spines nontender    Lab  Results Lab Results  Component Value Date   WBC 10.2 01/24/2024   HGB 8.4 (L) 01/24/2024   HCT 24.0 (L) 01/24/2024   MCV 90.2 01/24/2024   PLT 408 (H) 01/24/2024    Lab Results  Component Value Date   CREATININE 0.53 01/24/2024   BUN 12 01/23/2024   NA 137 01/23/2024   K 4.4 01/23/2024   CL 104 01/23/2024   CO2 22 01/23/2024    Lab Results  Component Value Date   ALT 74 (H) 01/23/2024   AST 63 (H) 01/23/2024   ALKPHOS 154 (H) 01/23/2024   BILITOT 3.5 (H) 01/23/2024      Microbiology: Recent Results (from the past 240 hours)  Culture, blood (Routine X 2) w Reflex to ID Panel     Status: Abnormal   Collection Time: 01/21/24 12:03 AM   Specimen: BLOOD LEFT ARM  Result Value Ref Range Status   Specimen Description   Final    BLOOD LEFT ARM Performed at [facility], [address] [place], [place] [address]    Special Requests   Final    BOTTLES DRAWN AEROBIC AND ANAEROBIC Blood Culture results may not be optimal due to an inadequate volume of blood received in culture bottles Performed at [facility], [address], [place], [place] [address]    Culture  Setup Time   Final    GRAM POSITIVE COCCI IN BOTH AEROBIC AND ANAEROBIC BOTTLES CRITICAL RESULT CALLED TO, READ BACK BY AND VERIFIED WITH: PHARMD JUSTIN FREDRIK [id number] AT 2056, ADC Performed at [facility], [address], [place], [place] [address]    Culture STAPHYLOCOCCUS AUREUS (A)  Final   Report Status 01/24/2024 FINAL  Final   Organism ID, Bacteria STAPHYLOCOCCUS AUREUS  Final      Susceptibility   Staphylococcus aureus - MIC*    CIPROFLOXACIN <=0.5 SENSITIVE Sensitive     ERYTHROMYCIN >=8 RESISTANT Resistant     GENTAMICIN <=0.5 SENSITIVE Sensitive     OXACILLIN <=0.25 SENSITIVE Sensitive     TETRACYCLINE <=1 SENSITIVE Sensitive     VANCOMYCIN  1 SENSITIVE Sensitive     TRIMETH/SULFA <=10 SENSITIVE Sensitive     CLINDAMYCIN RESISTANT Resistant     RIFAMPIN <=0.5 SENSITIVE  Sensitive     Inducible Clindamycin POSITIVE Resistant     LINEZOLID  2 SENSITIVE Sensitive     * STAPHYLOCOCCUS AUREUS  Culture, blood (Routine X 2) w Reflex to ID Panel     Status: Abnormal   Collection Time: 01/21/24 12:03 AM   Specimen: BLOOD LEFT HAND  Result Value Ref Range Status   Specimen Description   Final    BLOOD LEFT HAND Performed at [facility], [address], [place], [place] [address]    Special Requests   Final    BOTTLES DRAWN AEROBIC AND ANAEROBIC Blood Culture results may not be optimal  due to an inadequate volume of blood received in culture bottles Performed at [facility], [address] [place], [place] [address]    Culture  Setup Time   Final    GRAM POSITIVE COCCI IN CLUSTERS IN BOTH AEROBIC AND ANAEROBIC BOTTLES CRITICAL VALUE NOTED.  VALUE IS CONSISTENT WITH PREVIOUSLY REPORTED AND CALLED VALUE.    Culture (A)  Final    STAPHYLOCOCCUS AUREUS SUSCEPTIBILITIES PERFORMED ON PREVIOUS CULTURE WITHIN THE LAST 5 DAYS. Performed at [facility], [address] [place], [place] [address]    Report Status 01/23/2024 FINAL  Final  Blood Culture ID Panel (Reflexed)     Status: Abnormal   Collection Time: 01/21/24 12:03 AM  Result Value Ref Range Status   Enterococcus faecalis NOT DETECTED NOT DETECTED Final   Enterococcus Faecium NOT DETECTED NOT DETECTED Final   Listeria monocytogenes NOT DETECTED NOT DETECTED Final   Staphylococcus species DETECTED (A) NOT DETECTED Final    Comment: CRITICAL RESULT CALLED TO, READ BACK BY AND VERIFIED WITH: PHARMD JUSTIN L. [id number] AT 2056, ADC    Staphylococcus aureus (BCID) DETECTED (A) NOT DETECTED Final    Comment: CRITICAL RESULT CALLED TO, READ BACK BY AND VERIFIED WITH: PHARMD JUSTIN L. [id number] AT 2056, ADC    Staphylococcus epidermidis NOT DETECTED NOT DETECTED Final   Staphylococcus lugdunensis NOT DETECTED NOT DETECTED Final   Streptococcus species NOT DETECTED NOT DETECTED  Final   Streptococcus agalactiae NOT DETECTED NOT DETECTED Final   Streptococcus pneumoniae NOT DETECTED NOT DETECTED Final   Streptococcus pyogenes NOT DETECTED NOT DETECTED Final   A.calcoaceticus-baumannii NOT DETECTED NOT DETECTED Final   Bacteroides fragilis NOT DETECTED NOT DETECTED Final   Enterobacterales NOT DETECTED NOT DETECTED Final   Enterobacter cloacae complex NOT DETECTED NOT DETECTED Final   Escherichia coli NOT DETECTED NOT DETECTED Final   Klebsiella aerogenes NOT DETECTED NOT DETECTED Final   Klebsiella oxytoca NOT DETECTED NOT DETECTED Final   Klebsiella pneumoniae NOT DETECTED NOT DETECTED Final   Proteus species NOT DETECTED NOT DETECTED Final   Salmonella species NOT DETECTED NOT DETECTED Final   Serratia marcescens NOT DETECTED NOT DETECTED Final   Haemophilus influenzae NOT DETECTED NOT DETECTED Final   Neisseria meningitidis NOT DETECTED NOT DETECTED Final   Pseudomonas aeruginosa NOT DETECTED NOT DETECTED Final   Stenotrophomonas maltophilia NOT DETECTED NOT DETECTED Final   Candida albicans NOT DETECTED NOT DETECTED Final   Candida auris NOT DETECTED NOT DETECTED Final   Candida glabrata NOT DETECTED NOT DETECTED Final   Candida krusei NOT DETECTED NOT DETECTED Final   Candida parapsilosis NOT DETECTED NOT DETECTED Final   Candida tropicalis NOT DETECTED NOT DETECTED Final   Cryptococcus neoformans/gattii NOT DETECTED NOT DETECTED Final   Meth resistant mecA/C and MREJ NOT DETECTED NOT DETECTED Final    Comment: Performed at [facility], [address], [place], [place] [address]  Respiratory (~20 pathogens) panel by PCR     Status: None   Collection Time: 01/21/24  7:19 AM   Specimen: Nasopharyngeal Swab; Respiratory  Result Value Ref Range Status   Adenovirus NOT DETECTED NOT DETECTED Final   Coronavirus 229E NOT DETECTED NOT DETECTED Final    Comment: (NOTE) The Coronavirus on the Respiratory Panel, DOES NOT test for the novel  Coronavirus  (2019 nCoV)    Coronavirus HKU1 NOT DETECTED NOT DETECTED Final   Coronavirus NL63 NOT DETECTED NOT DETECTED Final   Coronavirus OC43 NOT DETECTED NOT DETECTED  Final   Metapneumovirus NOT DETECTED NOT DETECTED Final   Rhinovirus / Enterovirus NOT DETECTED NOT DETECTED Final   Influenza A NOT DETECTED NOT DETECTED Final   Influenza B NOT DETECTED NOT DETECTED Final   Parainfluenza Virus 1 NOT DETECTED NOT DETECTED Final   Parainfluenza Virus 2 NOT DETECTED NOT DETECTED Final   Parainfluenza Virus 3 NOT DETECTED NOT DETECTED Final   Parainfluenza Virus 4 NOT DETECTED NOT DETECTED Final   Respiratory Syncytial Virus NOT DETECTED NOT DETECTED Final   Bordetella pertussis NOT DETECTED NOT DETECTED Final   Bordetella Parapertussis NOT DETECTED NOT DETECTED Final   Chlamydophila pneumoniae NOT DETECTED NOT DETECTED Final   Mycoplasma pneumoniae NOT DETECTED NOT DETECTED Final    Comment: Performed at [facility], [address], [place], [place] [address]  Culture, blood (Routine X 2) w Reflex to ID Panel     Status: None (Preliminary result)   Collection Time: 01/23/24  5:54 AM   Specimen: BLOOD LEFT HAND  Result Value Ref Range Status   Specimen Description   Final    BLOOD LEFT HAND Performed at [facility], [address] [place], [place] [address]    Special Requests   Final    BOTTLES DRAWN AEROBIC AND ANAEROBIC Blood Culture results may not be optimal due to an inadequate volume of blood received in culture bottles Performed at [facility], [address] [place], [place] [address]    Culture   Final    NO GROWTH 1 DAY Performed at [facility], [address], [place], [place] [address]    Report Status PENDING  Incomplete  Culture, blood (Routine X 2) w Reflex to ID Panel     Status: None (Preliminary result)   Collection Time: 01/23/24  6:00 AM   Specimen: BLOOD LEFT HAND  Result Value Ref Range Status   Specimen Description   Final     BLOOD LEFT HAND Performed at [facility], [address] [place], [place] [address]    Special Requests   Final    BOTTLES DRAWN AEROBIC AND ANAEROBIC Blood Culture results may not be optimal due to an inadequate volume of blood received in culture bottles Performed at [facility], [address] [place], [place] [address]    Culture   Final    NO GROWTH 1 DAY Performed at [facility], [address], [place], [place] [address]    Report Status PENDING  Incomplete     Serology:   Imaging: If present, new imagings (plain films, ct scans, and mri) have been personally visualized and interpreted; radiology reports have been reviewed. Decision making incorporated into the Impression / Recommendations.  9/9 tte Conclusion(s)/Recommendation(s): No evidence of valvular vegetations on  this transthoracic echocardiogram. Consider a transesophageal  echocardiogram to exclude infective endocarditis if clinically indicated.   Constance ONEIDA Passer, MD [facility] [phone] pager    01/24/2024, 2:29 PM

## 2024-01-25 ENCOUNTER — Other Ambulatory Visit: Payer: Self-pay

## 2024-01-25 DIAGNOSIS — D5700 Hb-SS disease with crisis, unspecified: Secondary | ICD-10-CM | POA: Diagnosis not present

## 2024-01-25 DIAGNOSIS — R7881 Bacteremia: Secondary | ICD-10-CM | POA: Diagnosis not present

## 2024-01-25 DIAGNOSIS — B9561 Methicillin susceptible Staphylococcus aureus infection as the cause of diseases classified elsewhere: Secondary | ICD-10-CM | POA: Diagnosis not present

## 2024-01-25 LAB — CREATININE, SERUM
Creatinine, Ser: 0.53 mg/dL (ref 0.44–1.00)
GFR, Estimated: >60 mL/min (ref >60)

## 2024-01-25 MED ORDER — SODIUM CHLORIDE 0.9% FLUSH
10.0000 mL | Freq: Two times a day (BID) | INTRAVENOUS | Status: DC
  Administered 2024-01-25 – 2024-01-26 (×3): 10 mL

## 2024-01-25 MED ORDER — SODIUM CHLORIDE 0.9% FLUSH
10.0000 mL | INTRAVENOUS | Status: DC | PRN
  Administered 2024-01-26: 10 mL

## 2024-01-25 MED ORDER — CHLORHEXIDINE GLUCONATE CLOTH 2 % EX PADS
6.0000 | MEDICATED_PAD | Freq: Every day | CUTANEOUS | Status: DC
  Administered 2024-01-26: 6 via TOPICAL

## 2024-01-25 NOTE — TOC CM/SW Note (Signed)
 Pt requiring IV ABX. Pam from [employer] [facility]. [facility] will provide HHRN.

## 2024-01-25 NOTE — Progress Notes (Signed)
 SICKLE CELL SERVICE PROGRESS NOTE  Bailey Reese FMW:[id number] DOB: 2005-11-15 DOA: 01/15/2024 PCP: Morgan Slater Pizza, MD  Assessment/Plan: Principal Problem:   Sickle cell anemia with pain (HCC) Active Problems:   Sickle cell pain crisis (HCC)   Chronic pain syndrome   Leukocytosis   GAD (generalized anxiety disorder)  Sepsis: Patient has bacteremia secondary to MSSA.   Appears to be infection related to her line.  She had an peripheral IV line that apparently was oozing some pus.  Patient on Ancef ..  Infectious disease following.  Will continue treatment. Sickle cell pain crisis: Patient is still complaining of pain despite being on Dilaudid  PCA, has completed Toradol  and patient is currently stable as far as sickle cell pain is concerned.  She has had chronic pain and difficult to get her pain control.  She says she was feeling better but now has recurrence.  No change in pain medication. Encouraged to continue using the Dilaudid  PCA.  Adjust her oral medications Anemia of chronic disease: Hemoglobin has dropped down to 6.2 today.  Probably in the setting of bacteremia and sepsis.  We will transfuse 1 unit of packed red blood cells. Continue monitoring H&H. Chronic pain syndrome: Continue home regimen PTSD with anxiety: Continue antianxiety medications. GERD: Continue PPIs Staph aureus bacteremia: Probably from line infection.  Currently on Ancef  per infectious disease.  Repeat cultures are negative so far.  TTE showed no vegetation.  Infectious disease recommendation is to continue IV antibiotics for total of 14 days then changed to oral.  Code Status: Full code Family Communication: No family at bedside Disposition Plan: Home  [facility]  Pager [phone]. If 7PM-7AM, please contact night-coverage.  01/25/2024, 6:50 AM  LOS: 10 days   Brief narrative: Bailey Reese is a 17 y.o. female, with medical history significant of sickle cell disease, chronic pain, history of  hemorrhagic stroke in 2017, asthma, PTSD, depression, anxiety, GERD presenting from [facility] ED due to concern for sickle cell pain crisis.  Patient has been on admission with sickle cell pain crisis.  Now she is get bacteremia  Consultants: None  Procedures: Chest x-ray  Antibiotics: None  HPI/Subjective: Patient remains afebrile.  White count is improving, back to normal however patient's hemoglobin dropped to 6.2 today.  TTE shows no vegetation Objective: Vitals:   01/24/24 2133 01/24/24 2315 01/25/24 0350 01/25/24 0532  BP: 113/61   (!) 107/55  Pulse:    86  Resp:  15 16 16   Temp:    98.1 F (36.7 C)  TempSrc:    Oral  SpO2:    100%  Weight:      Height:       Weight change:  No intake or output data in the 24 hours ending 01/25/24 0650   General: Alert, awake, oriented x3, in no acute distress.  HEENT: Rogers/AT PEERL, EOMI Neck: Trachea midline,  no masses, no thyromegal,y no JVD, no carotid bruit OROPHARYNX:  Moist, No exudate/ erythema/lesions.  Heart: Regular rate and rhythm, without murmurs, rubs, gallops, PMI non-displaced, no heaves or thrills on palpation.  Lungs: Clear to auscultation, no wheezing or rhonchi noted. No increased vocal fremitus resonant to percussion  Abdomen: Soft, nontender, nondistended, positive bowel sounds, no masses no hepatosplenomegaly noted..  Neuro: No focal neurological deficits noted cranial nerves II through XII grossly intact. DTRs 2+ bilaterally upper and lower extremities. Strength 5 out of 5 in bilateral upper and lower extremities. Musculoskeletal: No warm swelling or erythema around joints, no spinal tenderness  noted. Psychiatric: Patient alert and oriented x3, good insight and cognition, good recent to remote recall. Lymph node survey: No cervical axillary or inguinal lymphadenopathy noted.   Data Reviewed: Basic Metabolic Panel: Recent Labs  Lab 01/20/24 1349 01/22/24 0512 01/23/24 0554 01/23/24 0849 01/24/24 0558  01/25/24 0527  NA 137  --   --  137  --   --   K 4.6  --   --  4.4  --   --   CL 101  --   --  104  --   --   CO2 23  --   --  22  --   --   GLUCOSE 94  --   --  99  --   --   BUN 10  --   --  12  --   --   CREATININE 0.61 0.83 0.55 0.50 0.53 0.53  CALCIUM 9.8  --   --  9.1  --   --    Liver Function Tests: Recent Labs  Lab 01/20/24 1349 01/23/24 0849  AST 94* 63*  ALT 109* 74*  ALKPHOS 168* 154*  BILITOT 7.9* 3.5*  PROT 8.0 7.2  ALBUMIN 4.4 3.7   No results for input(s): LIPASE, AMYLASE in the last 168 hours.  No results for input(s): AMMONIA in the last 168 hours. CBC: Recent Labs  Lab 01/20/24 1349 01/21/24 0003 01/22/24 0512 01/23/24 0849 01/24/24 0558  WBC 14.0* 15.6* 12.7* 10.5 10.2  NEUTROABS 9.6*  --   --   --   --   HGB 8.0* 7.3* 7.0* 6.2* 8.4*  HCT 24.4* 21.8* 21.4* 19.1* 24.0*  MCV 101.2* 104.8* 104.9* 104.4* 90.2  PLT 450* 375 364 343 408*   Cardiac Enzymes: No results for input(s): CKTOTAL, CKMB, CKMBINDEX, TROPONINI in the last 168 hours. BNP (last 3 results) Recent Labs    12/01/23 2047  BNP 22.4    ProBNP (last 3 results) No results for input(s): PROBNP in the last 8760 hours.  CBG: No results for input(s): GLUCAP in the last 168 hours.  Recent Results (from the past 240 hours)  Culture, blood (Routine X 2) w Reflex to ID Panel     Status: Abnormal   Collection Time: 01/21/24 12:03 AM   Specimen: BLOOD LEFT ARM  Result Value Ref Range Status   Specimen Description   Final    BLOOD LEFT ARM Performed at [facility], [address], [place], [place] [address]    Special Requests   Final    BOTTLES DRAWN AEROBIC AND ANAEROBIC Blood Culture results may not be optimal due to an inadequate volume of blood received in culture bottles Performed at [facility], [address], [place], [place] [address]    Culture  Setup Time   Final    GRAM POSITIVE COCCI IN BOTH AEROBIC AND ANAEROBIC  BOTTLES CRITICAL RESULT CALLED TO, READ BACK BY AND VERIFIED WITH: PHARMD JUSTIN FREDRIK [id number] AT 2056, ADC Performed at [facility], [address], [place], [place] [address]    Culture STAPHYLOCOCCUS AUREUS (A)  Final   Report Status 01/24/2024 FINAL  Final   Organism ID, Bacteria STAPHYLOCOCCUS AUREUS  Final      Susceptibility   Staphylococcus aureus - MIC*    CIPROFLOXACIN <=0.5 SENSITIVE Sensitive     ERYTHROMYCIN >=8 RESISTANT Resistant     GENTAMICIN <=0.5 SENSITIVE Sensitive     OXACILLIN <=0.25 SENSITIVE Sensitive     TETRACYCLINE <=  1 SENSITIVE Sensitive     VANCOMYCIN  1 SENSITIVE Sensitive     TRIMETH/SULFA <=10 SENSITIVE Sensitive     CLINDAMYCIN RESISTANT Resistant     RIFAMPIN <=0.5 SENSITIVE Sensitive     Inducible Clindamycin POSITIVE Resistant     LINEZOLID  2 SENSITIVE Sensitive     * STAPHYLOCOCCUS AUREUS  Culture, blood (Routine X 2) w Reflex to ID Panel     Status: Abnormal   Collection Time: 01/21/24 12:03 AM   Specimen: BLOOD LEFT HAND  Result Value Ref Range Status   Specimen Description   Final    BLOOD LEFT HAND Performed at [facility], [address] [place], [place] [address]    Special Requests   Final    BOTTLES DRAWN AEROBIC AND ANAEROBIC Blood Culture results may not be optimal due to an inadequate volume of blood received in culture bottles Performed at [facility], [address] [place], [place] [address]    Culture  Setup Time   Final    GRAM POSITIVE COCCI IN CLUSTERS IN BOTH AEROBIC AND ANAEROBIC BOTTLES CRITICAL VALUE NOTED.  VALUE IS CONSISTENT WITH PREVIOUSLY REPORTED AND CALLED VALUE.    Culture (A)  Final    STAPHYLOCOCCUS AUREUS SUSCEPTIBILITIES PERFORMED ON PREVIOUS CULTURE WITHIN THE LAST 5 DAYS. Performed at [facility], [address] [place], [place] [address]    Report Status 01/23/2024 FINAL  Final  Blood Culture ID Panel (Reflexed)     Status: Abnormal   Collection Time: 01/21/24  12:03 AM  Result Value Ref Range Status   Enterococcus faecalis NOT DETECTED NOT DETECTED Final   Enterococcus Faecium NOT DETECTED NOT DETECTED Final   Listeria monocytogenes NOT DETECTED NOT DETECTED Final   Staphylococcus species DETECTED (A) NOT DETECTED Final    Comment: CRITICAL RESULT CALLED TO, READ BACK BY AND VERIFIED WITH: PHARMD JUSTIN L. [id number] AT 2056, ADC    Staphylococcus aureus (BCID) DETECTED (A) NOT DETECTED Final    Comment: CRITICAL RESULT CALLED TO, READ BACK BY AND VERIFIED WITH: PHARMD JUSTIN L. [id number] AT 2056, ADC    Staphylococcus epidermidis NOT DETECTED NOT DETECTED Final   Staphylococcus lugdunensis NOT DETECTED NOT DETECTED Final   Streptococcus species NOT DETECTED NOT DETECTED Final   Streptococcus agalactiae NOT DETECTED NOT DETECTED Final   Streptococcus pneumoniae NOT DETECTED NOT DETECTED Final   Streptococcus pyogenes NOT DETECTED NOT DETECTED Final   A.calcoaceticus-baumannii NOT DETECTED NOT DETECTED Final   Bacteroides fragilis NOT DETECTED NOT DETECTED Final   Enterobacterales NOT DETECTED NOT DETECTED Final   Enterobacter cloacae complex NOT DETECTED NOT DETECTED Final   Escherichia coli NOT DETECTED NOT DETECTED Final   Klebsiella aerogenes NOT DETECTED NOT DETECTED Final   Klebsiella oxytoca NOT DETECTED NOT DETECTED Final   Klebsiella pneumoniae NOT DETECTED NOT DETECTED Final   Proteus species NOT DETECTED NOT DETECTED Final   Salmonella species NOT DETECTED NOT DETECTED Final   Serratia marcescens NOT DETECTED NOT DETECTED Final   Haemophilus influenzae NOT DETECTED NOT DETECTED Final   Neisseria meningitidis NOT DETECTED NOT DETECTED Final   Pseudomonas aeruginosa NOT DETECTED NOT DETECTED Final   Stenotrophomonas maltophilia NOT DETECTED NOT DETECTED Final   Candida albicans NOT DETECTED NOT DETECTED Final   Candida auris NOT DETECTED NOT DETECTED Final   Candida glabrata NOT DETECTED NOT DETECTED Final   Candida krusei NOT  DETECTED NOT DETECTED Final   Candida parapsilosis NOT DETECTED NOT DETECTED Final   Candida  tropicalis NOT DETECTED NOT DETECTED Final   Cryptococcus neoformans/gattii NOT DETECTED NOT DETECTED Final   Meth resistant mecA/C and MREJ NOT DETECTED NOT DETECTED Final    Comment: Performed at [facility], [address], [place], [place] [address]  Respiratory (~20 pathogens) panel by PCR     Status: None   Collection Time: 01/21/24  7:19 AM   Specimen: Nasopharyngeal Swab; Respiratory  Result Value Ref Range Status   Adenovirus NOT DETECTED NOT DETECTED Final   Coronavirus 229E NOT DETECTED NOT DETECTED Final    Comment: (NOTE) The Coronavirus on the Respiratory Panel, DOES NOT test for the novel  Coronavirus (2019 nCoV)    Coronavirus HKU1 NOT DETECTED NOT DETECTED Final   Coronavirus NL63 NOT DETECTED NOT DETECTED Final   Coronavirus OC43 NOT DETECTED NOT DETECTED Final   Metapneumovirus NOT DETECTED NOT DETECTED Final   Rhinovirus / Enterovirus NOT DETECTED NOT DETECTED Final   Influenza A NOT DETECTED NOT DETECTED Final   Influenza B NOT DETECTED NOT DETECTED Final   Parainfluenza Virus 1 NOT DETECTED NOT DETECTED Final   Parainfluenza Virus 2 NOT DETECTED NOT DETECTED Final   Parainfluenza Virus 3 NOT DETECTED NOT DETECTED Final   Parainfluenza Virus 4 NOT DETECTED NOT DETECTED Final   Respiratory Syncytial Virus NOT DETECTED NOT DETECTED Final   Bordetella pertussis NOT DETECTED NOT DETECTED Final   Bordetella Parapertussis NOT DETECTED NOT DETECTED Final   Chlamydophila pneumoniae NOT DETECTED NOT DETECTED Final   Mycoplasma pneumoniae NOT DETECTED NOT DETECTED Final    Comment: Performed at [facility], [address] [place], [place] [address]  Culture, blood (Routine X 2) w Reflex to ID Panel     Status: None (Preliminary result)   Collection Time: 01/23/24  5:54 AM   Specimen: BLOOD LEFT HAND  Result Value Ref Range Status   Specimen Description   Final     BLOOD LEFT HAND Performed at [facility], [address] [place], [place] [address]    Special Requests   Final    BOTTLES DRAWN AEROBIC AND ANAEROBIC Blood Culture results may not be optimal due to an inadequate volume of blood received in culture bottles Performed at [facility], [address], [place], [place] [address]    Culture   Final    NO GROWTH 1 DAY Performed at [facility], [address], [place], [place] [address]    Report Status PENDING  Incomplete  Culture, blood (Routine X 2) w Reflex to ID Panel     Status: None (Preliminary result)   Collection Time: 01/23/24  6:00 AM   Specimen: BLOOD LEFT HAND  Result Value Ref Range Status   Specimen Description   Final    BLOOD LEFT HAND Performed at [facility], [address], [place], [place] [address]    Special Requests   Final    BOTTLES DRAWN AEROBIC AND ANAEROBIC Blood Culture results may not be optimal due to an inadequate volume of blood received in culture bottles Performed at [facility], [address], [place], [place] [address]    Culture   Final    NO GROWTH 1 DAY Performed at [facility], [address], [place], [place] [address]    Report Status PENDING  Incomplete     Studies: ECHOCARDIOGRAM COMPLETE Result Date: 01/23/2024    ECHOCARDIOGRAM REPORT   Patient Name:   Bailey Reese  Dauphine Date of Exam: 01/23/2024 Medical Rec #:  [id number]          Height:       64.0 in Accession #:    [id number]         Weight:       143.3 lb Date of Birth:  [birth year]          BSA:          1.698 m Patient Age:    17 years           BP:           105/52 mmHg Patient Gender: F                  HR:           90 bpm. Exam Location:  Inpatient Procedure: 2D Echo, Cardiac Doppler and Color Doppler (Both Spectral and Color            Flow Doppler were utilized during procedure). Indications:    Bacteremia  History:        Patient has no prior history of  Echocardiogram examinations.                 Sickle Cell Anemia.  Sonographer:    Therisa Crouch Referring Phys: [id number] Guinevere Stephenson L [place] Droll IMPRESSIONS  1. Left ventricular ejection fraction, by estimation, is 55 to 60%. The left ventricle has normal function. The left ventricle has no regional wall motion abnormalities. Indeterminate diastolic filling due to E-A fusion.  2. Right ventricular systolic function is normal. The right ventricular size is normal. There is normal pulmonary artery systolic pressure. The estimated right ventricular systolic pressure is 25.8 mmHg.  3. The mitral valve is normal in structure. Trivial mitral valve regurgitation.  4. The aortic valve is tricuspid. Aortic valve regurgitation is not visualized. No aortic stenosis is present.  5. The inferior vena cava is normal in size with greater than 50% respiratory variability, suggesting right atrial pressure of 3 mmHg. Conclusion(s)/Recommendation(s): No evidence of valvular vegetations on this transthoracic echocardiogram. Consider a transesophageal echocardiogram to exclude infective endocarditis if clinically indicated. FINDINGS  Left Ventricle: Left ventricular ejection fraction, by estimation, is 55 to 60%. The left ventricle has normal function. The left ventricle has no regional wall motion abnormalities. The left ventricular internal cavity size was normal in size. There is  no left ventricular hypertrophy. Indeterminate diastolic filling due to E-A fusion. Right Ventricle: The right ventricular size is normal. No increase in right ventricular wall thickness. Right ventricular systolic function is normal. There is normal pulmonary artery systolic pressure. The tricuspid regurgitant velocity is 2.39 m/s, and  with an assumed right atrial pressure of 3 mmHg, the estimated right ventricular systolic pressure is 25.8 mmHg. Left Atrium: Left atrial size was normal in size. Right Atrium: Right atrial size was normal in size. Pericardium: There  is no evidence of pericardial effusion. Mitral Valve: The mitral valve is normal in structure. Trivial mitral valve regurgitation. Tricuspid Valve: The tricuspid valve is normal in structure. Tricuspid valve regurgitation is mild. Aortic Valve: The aortic valve is tricuspid. Aortic valve regurgitation is not visualized. No aortic stenosis is present. Aortic valve mean gradient measures 6.0 mmHg. Aortic valve peak gradient measures 10.6 mmHg. Aortic valve area, by VTI measures 2.57  cm. Pulmonic Valve: The pulmonic valve was normal in structure. Pulmonic valve regurgitation is trivial. No evidence of pulmonic stenosis. Aorta: The aortic root and ascending aorta are structurally normal,  with no evidence of dilitation. Venous: The inferior vena cava is normal in size with greater than 50% respiratory variability, suggesting right atrial pressure of 3 mmHg. IAS/Shunts: No atrial level shunt detected by color flow Doppler.  LEFT VENTRICLE PLAX 2D LVIDd:         5.10 cm   Diastology LVIDs:         3.10 cm   LV e' lateral:   11.30 cm/s LV PW:         0.90 cm   LV E/e' lateral: 1.1 LV IVS:        1.00 cm LVOT diam:     2.10 cm LV SV:         75 LV SV Index:   44 LVOT Area:     3.46 cm  RIGHT VENTRICLE            IVC RV Basal diam:  3.90 cm    IVC diam: 1.50 cm RV S prime:     9.90 cm/s TAPSE (M-mode): 2.0 cm LEFT ATRIUM             Index        RIGHT ATRIUM           Index LA diam:        3.20 cm 1.88 cm/m   RA Area:     14.90 cm LA Vol (A2C):   43.8 ml 25.80 ml/m  RA Volume:   38.30 ml  22.56 ml/m LA Vol (A4C):   41.7 ml 24.56 ml/m LA Biplane Vol: 46.1 ml 27.15 ml/m  AORTIC VALVE AV Area (Vmax):    2.51 cm AV Area (Vmean):   2.43 cm AV Area (VTI):     2.57 cm AV Vmax:           163.00 cm/s AV Vmean:          114.000 cm/s AV VTI:            0.291 m AV Peak Grad:      10.6 mmHg AV Mean Grad:      6.0 mmHg LVOT Vmax:         118.00 cm/s LVOT Vmean:        79.900 cm/s LVOT VTI:          0.216 m LVOT/AV VTI ratio:  0.74  AORTA Ao Root diam: 2.60 cm Ao Asc diam:  2.70 cm MV E velocity: 12.80 cm/s  TRICUSPID VALVE                            TR Peak grad:   22.8 mmHg                            TR Vmax:        239.00 cm/s                             SHUNTS                            Systemic VTI:  0.22 m                            Systemic Diam: 2.10 cm Jerel Croitoru MD Electronically signed by Jerel Balding MD Signature Date/Time: 01/23/2024/1:10:19 PM  Final    DG Chest Port 1 View Result Date: 01/21/2024 CLINICAL DATA:  Fever and sepsis EXAM: PORTABLE CHEST 1 VIEW COMPARISON:  01/15/2024 FINDINGS: Cardiopericardial silhouette is at upper limits of normal for size. Streaky opacity in the left base suggest atelectasis. No dense focal airspace consolidation. No pulmonary edema or pleural effusion. No acute bony abnormality. IMPRESSION: Streaky opacity in the left base suggests atelectasis. Electronically Signed   By: Camellia Candle M.D.   On: 01/21/2024 06:26   DG Chest 2 View Result Date: 01/15/2024 CLINICAL DATA:  18 year old female with sickle cell disease and chest pain. EXAM: CHEST - 2 VIEW COMPARISON:  CTA chest 12/11/2023. Chest radiographs 01/05/2024 and earlier. FINDINGS: AP and lateral views of the chest 0820 hours. Lower lung volumes. Stable mild cardiomegaly. Other mediastinal contours are within normal limits. Visualized tracheal air column is within normal limits. No pneumothorax, pleural effusion, pulmonary edema, confluent lung opacity. Stable lung markings. Stable visualized osseous structures. Intermittent H-shaped vertebral body deformities better demonstrated by CTA. Negative visible bowel gas. IMPRESSION: Lower lung volumes. No acute cardiopulmonary abnormality. Sequelae of sickle cell disease. Electronically Signed   By: VEAR Hurst M.D.   On: 01/15/2024 08:39   DG Chest 2 View Result Date: 01/05/2024 CLINICAL DATA:  Shortness of breath. EXAM: CHEST - 2 VIEW COMPARISON:  None Available. FINDINGS: The  heart size and mediastinal contours are within normal limits. Both lungs are clear. The visualized skeletal structures are unremarkable. IMPRESSION: No active cardiopulmonary disease. Electronically Signed   By: Lynwood Landy Raddle M.D.   On: 01/05/2024 08:06   MR BRAIN W WO CONTRAST Result Date: 01/02/2024 EXAM: MRI BRAIN WITH AND WITHOUT CONTRAST 01/02/2024 03:45:45 PM TECHNIQUE: Multiplanar multisequence MRI of the head/brain was performed with and without the administration of intravenous contrast. 7mL gadavist  (gadobutrol  1 mmol/mL). COMPARISON: CT head 12/31/2023. CLINICAL HISTORY: Seizure, new-onset, no history of trauma. FINDINGS: BRAIN AND VENTRICLES: No acute infarct. No acute intracranial hemorrhage. No mass effect or midline shift. The ventricles are normal in size. The cerebellar tonsils extend 18 mm below the foramen magnum and are compressed. The CSF spaces at the foramen magnum are effaced. No brain sagging. The brain is normal in signal, and no abnormal enhancement is identified . Dedicated thin section imaging through the temporal lobes demonstrates normal volume and signal of the hippocampi. There is no evidence of heterotopia or cortical dysplasia. ORBITS: No acute abnormality. SINUSES: No acute abnormality. BONES AND SOFT TISSUES: Diffusely diminished bone marrow T1 signal intensity, nonspecific but compatible with the history of sickle cell disease. IMPRESSION: 1. Chiari I malformation. 2. Otherwise negative brain MRI. Electronically signed by: Dasie Hamburg MD 01/02/2024 04:29 PM EDT RP Workstation: [id number]   EEG adult Result Date: 01/01/2024 Shelton Arlin KIDD, MD     01/01/2024  8:44 PM Patient Name: Bailey Reese MRN: [id number] Epilepsy Attending: Arlin KIDD Shelton Referring Physician/Provider: Alfornia Madison, MD Date: 01/01/2024 Duration: 27.29 mins Patient history: 18 y.o. female with possible seizure-like activity. EEG to evaluate for seizure. Level of alertness: Awake AEDs  during EEG study: None Technical aspects: This EEG study was done with scalp electrodes positioned according to the 10-20 International system of electrode placement. Electrical activity was reviewed with band pass filter of 1-70Hz , sensitivity of 7 uV/mm, display speed of 97mm/sec with a 60Hz  notched filter applied as appropriate. EEG data were recorded continuously and digitally stored.  Video monitoring was available and reviewed as appropriate. Description: Per EEG tech, could not get all  electrodes under 10 ohms due to condition of pts scalp. O1 P3 Pz P4 C3 and Cz will not reach scalp despite multiple attempts. Hyperventilation and photic stimulation were not performed.   IMPRESSION: This study was not interpretable due to significant electrode artifact. Arlin MALVA Krebs   DG Chest 2 View Result Date: 12/31/2023 EXAM: 2 VIEW(S) XRAY OF THE CHEST 12/31/2023 10:02:00 PM COMPARISON: None available. CLINICAL HISTORY: Evaluate for acute chest, sickle cell. Sickle cell. FINDINGS: LUNGS AND PLEURA: No focal pulmonary opacity. No pulmonary edema. No pleural effusion. No pneumothorax. HEART AND MEDIASTINUM: No acute abnormality of the cardiac and mediastinal silhouettes. BONES AND SOFT TISSUES: No acute osseous abnormality. IMPRESSION: 1. No acute process. Electronically signed by: Franky Stanford MD 12/31/2023 10:12 PM EDT RP Workstation: [id number]   CT Head Wo Contrast Result Date: 12/31/2023 EXAM: CT HEAD WITHOUT CONTRAST 12/31/2023 10:07:18 PM TECHNIQUE: CT of the head was performed without the administration of intravenous contrast. Automated exposure control, iterative reconstruction, and/or weight based adjustment of the mA/kV was utilized to reduce the radiation dose to as low as reasonably achievable. COMPARISON: 03/02/2017 CLINICAL HISTORY: Seizure, sickle cell, history of intracerebral hemorrhage (ICH). Patient brought in by EMS for seizure. Patient was at church and not acting normal. Patient was  described to be having a seizure by mother. The patient has a history of seizures, sickle cell, and aneurysms causing strokes per EMS. Patient crying and unable to console at triage. FINDINGS: BRAIN AND VENTRICLES: No acute hemorrhage. Gray-white differentiation is preserved. No hydrocephalus. No extra-axial collection. No mass effect or midline shift. ORBITS: No acute abnormality. SINUSES: No acute abnormality. SOFT TISSUES AND SKULL: No acute soft tissue abnormality. No skull fracture. Cerebellar tonsils extend 12 mm below the foramen magnum consistent with Chiari malformation. IMPRESSION: 1. No acute intracranial abnormality. 2. 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 Electronically signed by: Franky Stanford MD 12/31/2023 10:11 PM EDT RP Workstation: [id number]    Scheduled Meds:  Deferasirox   360 mg Oral BID   enoxaparin  (LOVENOX ) injection  40 mg Subcutaneous Q24H   fluconazole   150 mg Oral Daily   HYDROmorphone    Intravenous Q4H   hydroxyurea   1,500 mg Oral Daily   hydrOXYzine   25 mg Oral BID   pantoprazole   40 mg Oral Daily   prazosin   1 mg Oral QHS   pregabalin   150 mg Oral TID   senna-docusate  1 tablet Oral BID   Continuous Infusions:   ceFAZolin  (ANCEF ) IV 2 g (01/25/24 0508)    Principal Problem:   Sickle cell anemia with pain (HCC) Active Problems:   Sickle cell pain crisis (HCC)   Chronic pain syndrome   Leukocytosis   GAD (generalized anxiety disorder)

## 2024-01-25 NOTE — Progress Notes (Signed)
 Peripherally Inserted Central Catheter Placement  The IV Nurse has discussed with the patient and/or persons authorized to consent for the patient, the purpose of this procedure and the potential benefits and risks involved with this procedure.  The benefits include less needle sticks, lab draws from the catheter, and the patient may be discharged home with the catheter. Risks include, but not limited to, infection, bleeding, blood clot (thrombus formation), and puncture of an artery; nerve damage and irregular heartbeat and possibility to perform a PICC exchange if needed/ordered by physician.  Alternatives to this procedure were also discussed.  Bard Power PICC patient education guide, fact sheet on infection prevention and patient information card has been provided to patient /or left at bedside.    PICC Placement Documentation  PICC Single Lumen 01/25/24 Left Brachial 40 cm 0 cm (Active)  Indication for Insertion or Continuance of Line Prolonged intravenous therapies 01/25/24 1053  Exposed Catheter (cm) 0 cm 01/25/24 1053  Site Assessment Clean, Dry, Intact 01/25/24 1053  Line Status Flushed;Saline locked;Blood return noted 01/25/24 1053  Dressing Type Transparent;Securing device 01/25/24 1053  Dressing Status Antimicrobial disc/dressing in place;Clean, Dry, Intact 01/25/24 1053  Line Care Connections checked and tightened 01/25/24 1053  Line Adjustment (NICU/IV Team Only) No 01/25/24 1053  Dressing Intervention New dressing;Adhesive placed at insertion site (IV team only) 01/25/24 1053  Dressing Change Due 02/01/24 01/25/24 1053       Bailey Reese 01/25/2024, 10:54 AM

## 2024-01-25 NOTE — Plan of Care (Signed)

## 2024-01-25 NOTE — Progress Notes (Signed)
 SICKLE CELL SERVICE PROGRESS NOTE  Bailey Reese FMW:[id number] DOB: 09/21/2005 DOA: 01/15/2024 PCP: Morgan Slater Pizza, MD  Assessment/Plan: Principal Problem:   Sickle cell anemia with pain (HCC) Active Problems:   Sickle cell pain crisis (HCC)   Chronic pain syndrome   Leukocytosis   GAD (generalized anxiety disorder)  Sepsis: Patient has bacteremia secondary to MSSA.   Appears to be infection related to her line.  She had an peripheral IV line that apparently was oozing some pus.  Patient on Ancef . Plan is to treat patient for 2 weeks of IV Ancef  and then switch to orals.  Infectious disease has recommended midline but not today tomorrow prior to discharge..  Infectious disease following.  Will continue treatment. Sickle cell pain crisis: Patient is still complaining of pain despite being on Dilaudid  PCA, has completed Toradol  and patient is currently stable as far as sickle cell pain is concerned.  She has had chronic pain and difficult to get her pain control.  She says she was feeling better but now has recurrence.  No change in pain medication. Encouraged to continue using the Dilaudid  PCA.  Adjust her oral medications Anemia of chronic disease: Hemoglobin has dropped down to 6.2 today.  Probably in the setting of bacteremia and sepsis.  We will transfuse 1 unit of packed red blood cells. Continue monitoring H&H. Chronic pain syndrome: Continue home regimen PTSD with anxiety: Continue antianxiety medications. GERD: Continue PPIs Staph aureus bacteremia: Probably from line infection.  Currently on Ancef  per infectious disease.  Repeat cultures are negative so far.  TTE showed no vegetation.  Infectious disease recommendation is to continue IV antibiotics for total of 14 days then changed to oral.  Repeat cultures will be finalized hopefully tomorrow if he still negative we will insert a midline and discharge patient  Code Status: Full code Family Communication: No family at  bedside Disposition Plan: Home  [facility]  Pager [phone]. If 7PM-7AM, please contact night-coverage.  01/25/2024, 6:58 AM  LOS: 10 days   Brief narrative: Bailey Reese is a 18 y.o. female, with medical history significant of sickle cell disease, chronic pain, history of hemorrhagic stroke in 2017, asthma, PTSD, depression, anxiety, GERD presenting from [facility] ED due to concern for sickle cell pain crisis.  Patient has been on admission with sickle cell pain crisis.  Now she is get bacteremia  Consultants: None  Procedures: Chest x-ray  Antibiotics: None  HPI/Subjective: Patient remains afebrile.  White count is improving, back to normal however patient's hemoglobin dropped to 6.2 today.  TTE shows no vegetation Objective: Vitals:   01/24/24 2133 01/24/24 2315 01/25/24 0350 01/25/24 0532  BP: 113/61   (!) 107/55  Pulse:    86  Resp:  15 16 16   Temp:    98.1 F (36.7 C)  TempSrc:    Oral  SpO2:    100%  Weight:      Height:       Weight change:  No intake or output data in the 24 hours ending 01/25/24 0658   General: Alert, awake, oriented x3, in no acute distress.  HEENT: [place]/AT PEERL, EOMI Neck: Trachea midline,  no masses, no thyromegal,y no JVD, no carotid bruit OROPHARYNX:  Moist, No exudate/ erythema/lesions.  Heart: Regular rate and rhythm, without murmurs, rubs, gallops, PMI non-displaced, no heaves or thrills on palpation.  Lungs: Clear to auscultation, no wheezing or rhonchi noted. No increased vocal fremitus resonant to percussion  Abdomen: Soft, nontender, nondistended, positive bowel  sounds, no masses no hepatosplenomegaly noted..  Neuro: No focal neurological deficits noted cranial nerves II through XII grossly intact. DTRs 2+ bilaterally upper and lower extremities. Strength 5 out of 5 in bilateral upper and lower extremities. Musculoskeletal: No warm swelling or erythema around joints, no spinal tenderness noted. Psychiatric: Patient alert and  oriented x3, good insight and cognition, good recent to remote recall. Lymph node survey: No cervical axillary or inguinal lymphadenopathy noted.   Data Reviewed: Basic Metabolic Panel: Recent Labs  Lab 01/20/24 1349 01/22/24 0512 01/23/24 0554 01/23/24 0849 01/24/24 0558 01/25/24 0527  NA 137  --   --  137  --   --   K 4.6  --   --  4.4  --   --   CL 101  --   --  104  --   --   CO2 23  --   --  22  --   --   GLUCOSE 94  --   --  99  --   --   BUN 10  --   --  12  --   --   CREATININE 0.61 0.83 0.55 0.50 0.53 0.53  CALCIUM 9.8  --   --  9.1  --   --    Liver Function Tests: Recent Labs  Lab 01/20/24 1349 01/23/24 0849  AST 94* 63*  ALT 109* 74*  ALKPHOS 168* 154*  BILITOT 7.9* 3.5*  PROT 8.0 7.2  ALBUMIN 4.4 3.7   No results for input(s): LIPASE, AMYLASE in the last 168 hours.  No results for input(s): AMMONIA in the last 168 hours. CBC: Recent Labs  Lab 01/20/24 1349 01/21/24 0003 01/22/24 0512 01/23/24 0849 01/24/24 0558  WBC 14.0* 15.6* 12.7* 10.5 10.2  NEUTROABS 9.6*  --   --   --   --   HGB 8.0* 7.3* 7.0* 6.2* 8.4*  HCT 24.4* 21.8* 21.4* 19.1* 24.0*  MCV 101.2* 104.8* 104.9* 104.4* 90.2  PLT 450* 375 364 343 408*   Cardiac Enzymes: No results for input(s): CKTOTAL, CKMB, CKMBINDEX, TROPONINI in the last 168 hours. BNP (last 3 results) Recent Labs    12/01/23 2047  BNP 22.4    ProBNP (last 3 results) No results for input(s): PROBNP in the last 8760 hours.  CBG: No results for input(s): GLUCAP in the last 168 hours.  Recent Results (from the past 240 hours)  Culture, blood (Routine X 2) w Reflex to ID Panel     Status: Abnormal   Collection Time: 01/21/24 12:03 AM   Specimen: BLOOD LEFT ARM  Result Value Ref Range Status   Specimen Description   Final    BLOOD LEFT ARM Performed at [facility], [address] [place], [place] [address]    Special Requests   Final    BOTTLES DRAWN AEROBIC AND ANAEROBIC Blood  Culture results may not be optimal due to an inadequate volume of blood received in culture bottles Performed at [facility], [address], [place], [place] [address]    Culture  Setup Time   Final    GRAM POSITIVE COCCI IN BOTH AEROBIC AND ANAEROBIC BOTTLES CRITICAL RESULT CALLED TO, READ BACK BY AND VERIFIED WITH: PHARMD JUSTIN FREDRIK [id number] AT 2056, ADC Performed at [facility], [address] [place], [place] [address]    Culture STAPHYLOCOCCUS AUREUS (A)  Final   Report Status 01/24/2024 FINAL  Final   Organism ID, Bacteria STAPHYLOCOCCUS AUREUS  Final      Susceptibility   Staphylococcus aureus - MIC*    CIPROFLOXACIN <=0.5 SENSITIVE Sensitive     ERYTHROMYCIN >=8 RESISTANT Resistant     GENTAMICIN <=0.5 SENSITIVE Sensitive     OXACILLIN <=0.25 SENSITIVE Sensitive     TETRACYCLINE <=1 SENSITIVE Sensitive     VANCOMYCIN  1 SENSITIVE Sensitive     TRIMETH/SULFA <=10 SENSITIVE Sensitive     CLINDAMYCIN RESISTANT Resistant     RIFAMPIN <=0.5 SENSITIVE Sensitive     Inducible Clindamycin POSITIVE Resistant     LINEZOLID  2 SENSITIVE Sensitive     * STAPHYLOCOCCUS AUREUS  Culture, blood (Routine X 2) w Reflex to ID Panel     Status: Abnormal   Collection Time: 01/21/24 12:03 AM   Specimen: BLOOD LEFT HAND  Result Value Ref Range Status   Specimen Description   Final    BLOOD LEFT HAND Performed at [facility], [address], [place], [place] [address]    Special Requests   Final    BOTTLES DRAWN AEROBIC AND ANAEROBIC Blood Culture results may not be optimal due to an inadequate volume of blood received in culture bottles Performed at [facility], [address], [place], [place] [address]    Culture  Setup Time   Final    GRAM POSITIVE COCCI IN CLUSTERS IN BOTH AEROBIC AND ANAEROBIC BOTTLES CRITICAL VALUE NOTED.  VALUE IS CONSISTENT WITH PREVIOUSLY REPORTED AND CALLED VALUE.    Culture (A)  Final    STAPHYLOCOCCUS  AUREUS SUSCEPTIBILITIES PERFORMED ON PREVIOUS CULTURE WITHIN THE LAST 5 DAYS. Performed at [facility], [address] [place], [place] [address]    Report Status 01/23/2024 FINAL  Final  Blood Culture ID Panel (Reflexed)     Status: Abnormal   Collection Time: 01/21/24 12:03 AM  Result Value Ref Range Status   Enterococcus faecalis NOT DETECTED NOT DETECTED Final   Enterococcus Faecium NOT DETECTED NOT DETECTED Final   Listeria monocytogenes NOT DETECTED NOT DETECTED Final   Staphylococcus species DETECTED (A) NOT DETECTED Final    Comment: CRITICAL RESULT CALLED TO, READ BACK BY AND VERIFIED WITH: PHARMD JUSTIN L. [id number] AT 2056, ADC    Staphylococcus aureus (BCID) DETECTED (A) NOT DETECTED Final    Comment: CRITICAL RESULT CALLED TO, READ BACK BY AND VERIFIED WITH: PHARMD JUSTIN L. [id number] AT 2056, ADC    Staphylococcus epidermidis NOT DETECTED NOT DETECTED Final   Staphylococcus lugdunensis NOT DETECTED NOT DETECTED Final   Streptococcus species NOT DETECTED NOT DETECTED Final   Streptococcus agalactiae NOT DETECTED NOT DETECTED Final   Streptococcus pneumoniae NOT DETECTED NOT DETECTED Final   Streptococcus pyogenes NOT DETECTED NOT DETECTED Final   A.calcoaceticus-baumannii NOT DETECTED NOT DETECTED Final   Bacteroides fragilis NOT DETECTED NOT DETECTED Final   Enterobacterales NOT DETECTED NOT DETECTED Final   Enterobacter cloacae complex NOT DETECTED NOT DETECTED Final   Escherichia coli NOT DETECTED NOT DETECTED Final   Klebsiella aerogenes NOT DETECTED NOT DETECTED Final   Klebsiella oxytoca NOT DETECTED NOT DETECTED Final   Klebsiella pneumoniae NOT DETECTED NOT DETECTED Final   Proteus species NOT DETECTED NOT DETECTED Final   Salmonella species NOT DETECTED NOT DETECTED Final   Serratia marcescens NOT DETECTED NOT DETECTED Final   Haemophilus influenzae NOT DETECTED NOT DETECTED Final   Neisseria meningitidis NOT DETECTED NOT DETECTED Final   Pseudomonas  aeruginosa NOT DETECTED NOT DETECTED Final   Stenotrophomonas maltophilia NOT DETECTED NOT DETECTED  Final   Candida albicans NOT DETECTED NOT DETECTED Final   Candida auris NOT DETECTED NOT DETECTED Final   Candida glabrata NOT DETECTED NOT DETECTED Final   Candida krusei NOT DETECTED NOT DETECTED Final   Candida parapsilosis NOT DETECTED NOT DETECTED Final   Candida tropicalis NOT DETECTED NOT DETECTED Final   Cryptococcus neoformans/gattii NOT DETECTED NOT DETECTED Final   Meth resistant mecA/C and MREJ NOT DETECTED NOT DETECTED Final    Comment: Performed at [facility], [address] [place], [place] [address]  Respiratory (~20 pathogens) panel by PCR     Status: None   Collection Time: 01/21/24  7:19 AM   Specimen: Nasopharyngeal Swab; Respiratory  Result Value Ref Range Status   Adenovirus NOT DETECTED NOT DETECTED Final   Coronavirus 229E NOT DETECTED NOT DETECTED Final    Comment: (NOTE) The Coronavirus on the Respiratory Panel, DOES NOT test for the novel  Coronavirus (2019 nCoV)    Coronavirus HKU1 NOT DETECTED NOT DETECTED Final   Coronavirus NL63 NOT DETECTED NOT DETECTED Final   Coronavirus OC43 NOT DETECTED NOT DETECTED Final   Metapneumovirus NOT DETECTED NOT DETECTED Final   Rhinovirus / Enterovirus NOT DETECTED NOT DETECTED Final   Influenza A NOT DETECTED NOT DETECTED Final   Influenza B NOT DETECTED NOT DETECTED Final   Parainfluenza Virus 1 NOT DETECTED NOT DETECTED Final   Parainfluenza Virus 2 NOT DETECTED NOT DETECTED Final   Parainfluenza Virus 3 NOT DETECTED NOT DETECTED Final   Parainfluenza Virus 4 NOT DETECTED NOT DETECTED Final   Respiratory Syncytial Virus NOT DETECTED NOT DETECTED Final   Bordetella pertussis NOT DETECTED NOT DETECTED Final   Bordetella Parapertussis NOT DETECTED NOT DETECTED Final   Chlamydophila pneumoniae NOT DETECTED NOT DETECTED Final   Mycoplasma pneumoniae NOT DETECTED NOT DETECTED Final    Comment: Performed at  [facility], [address] [place], [place] [address]  Culture, blood (Routine X 2) w Reflex to ID Panel     Status: None (Preliminary result)   Collection Time: 01/23/24  5:54 AM   Specimen: BLOOD LEFT HAND  Result Value Ref Range Status   Specimen Description   Final    BLOOD LEFT HAND Performed at [facility], [address] [place], [place] [address]    Special Requests   Final    BOTTLES DRAWN AEROBIC AND ANAEROBIC Blood Culture results may not be optimal due to an inadequate volume of blood received in culture bottles Performed at [facility], [address] [place], [place] [address]    Culture   Final    NO GROWTH 1 DAY Performed at [facility], [address], [place], [place] [address]    Report Status PENDING  Incomplete  Culture, blood (Routine X 2) w Reflex to ID Panel     Status: None (Preliminary result)   Collection Time: 01/23/24  6:00 AM   Specimen: BLOOD LEFT HAND  Result Value Ref Range Status   Specimen Description   Final    BLOOD LEFT HAND Performed at [facility], [address] [place], [place] [address]    Special Requests   Final    BOTTLES DRAWN AEROBIC AND ANAEROBIC Blood Culture results may not be optimal due to an inadequate volume of blood received in culture bottles Performed at [facility], [address] [place], [place] [address]    Culture   Final  NO GROWTH 1 DAY Performed at [facility], [address], [place], [place] [address]    Report Status PENDING  Incomplete     Studies: ECHOCARDIOGRAM COMPLETE Result Date: 01/23/2024    ECHOCARDIOGRAM REPORT   Patient Name:   Bailey Reese Date of Exam: 01/23/2024 Medical Rec #:  [id number]          Height:       64.0 in Accession #:    [id number]         Weight:       143.3 lb Date of Birth:  Dec 28, 2005          BSA:          1.698 m Patient Age:    17 years           BP:           105/52 mmHg Patient Gender: F                   HR:           90 bpm. Exam Location:  Inpatient Procedure: 2D Echo, Cardiac Doppler and Color Doppler (Both Spectral and Color            Flow Doppler were utilized during procedure). Indications:    Bacteremia  History:        Patient has no prior history of Echocardiogram examinations.                 Sickle Cell Anemia.  Sonographer:    Therisa Crouch Referring Phys: [id number] K[address] L Takira Sherrin IMPRESSIONS  1. Left ventricular ejection fraction, by estimation, is 55 to 60%. The left ventricle has normal function. The left ventricle has no regional wall motion abnormalities. Indeterminate diastolic filling due to E-A fusion.  2. Right ventricular systolic function is normal. The right ventricular size is normal. There is normal pulmonary artery systolic pressure. The estimated right ventricular systolic pressure is 25.8 mmHg.  3. The mitral valve is normal in structure. Trivial mitral valve regurgitation.  4. The aortic valve is tricuspid. Aortic valve regurgitation is not visualized. No aortic stenosis is present.  5. The inferior vena cava is normal in size with greater than 50% respiratory variability, suggesting right atrial pressure of 3 mmHg. Conclusion(s)/Recommendation(s): No evidence of valvular vegetations on this transthoracic echocardiogram. Consider a transesophageal echocardiogram to exclude infective endocarditis if clinically indicated. FINDINGS  Left Ventricle: Left ventricular ejection fraction, by estimation, is 55 to 60%. The left ventricle has normal function. The left ventricle has no regional wall motion abnormalities. The left ventricular internal cavity size was normal in size. There is  no left ventricular hypertrophy. Indeterminate diastolic filling due to E-A fusion. Right Ventricle: The right ventricular size is normal. No increase in right ventricular wall thickness. Right ventricular systolic function is normal. There is normal pulmonary artery systolic pressure. The tricuspid  regurgitant velocity is 2.39 m/s, and  with an assumed right atrial pressure of 3 mmHg, the estimated right ventricular systolic pressure is 25.8 mmHg. Left Atrium: Left atrial size was normal in size. Right Atrium: Right atrial size was normal in size. Pericardium: There is no evidence of pericardial effusion. Mitral Valve: The mitral valve is normal in structure. Trivial mitral valve regurgitation. Tricuspid Valve: The tricuspid valve is normal in structure. Tricuspid valve regurgitation is mild. Aortic Valve: The aortic valve is tricuspid. Aortic valve regurgitation is not visualized. No aortic stenosis is present. Aortic valve mean gradient  measures 6.0 mmHg. Aortic valve peak gradient measures 10.6 mmHg. Aortic valve area, by VTI measures 2.57  cm. Pulmonic Valve: The pulmonic valve was normal in structure. Pulmonic valve regurgitation is trivial. No evidence of pulmonic stenosis. Aorta: The aortic root and ascending aorta are structurally normal, with no evidence of dilitation. Venous: The inferior vena cava is normal in size with greater than 50% respiratory variability, suggesting right atrial pressure of 3 mmHg. IAS/Shunts: No atrial level shunt detected by color flow Doppler.  LEFT VENTRICLE PLAX 2D LVIDd:         5.10 cm   Diastology LVIDs:         3.10 cm   LV e' lateral:   11.30 cm/s LV PW:         0.90 cm   LV E/e' lateral: 1.1 LV IVS:        1.00 cm LVOT diam:     2.10 cm LV SV:         75 LV SV Index:   44 LVOT Area:     3.46 cm  RIGHT VENTRICLE            IVC RV Basal diam:  3.90 cm    IVC diam: 1.50 cm RV S prime:     9.90 cm/s TAPSE (M-mode): 2.0 cm LEFT ATRIUM             Index        RIGHT ATRIUM           Index LA diam:        3.20 cm 1.88 cm/m   RA Area:     14.90 cm LA Vol (A2C):   43.8 ml 25.80 ml/m  RA Volume:   38.30 ml  22.56 ml/m LA Vol (A4C):   41.7 ml 24.56 ml/m LA Biplane Vol: 46.1 ml 27.15 ml/m  AORTIC VALVE AV Area (Vmax):    2.51 cm AV Area (Vmean):   2.43 cm AV Area  (VTI):     2.57 cm AV Vmax:           163.00 cm/s AV Vmean:          114.000 cm/s AV VTI:            0.291 m AV Peak Grad:      10.6 mmHg AV Mean Grad:      6.0 mmHg LVOT Vmax:         118.00 cm/s LVOT Vmean:        79.900 cm/s LVOT VTI:          0.216 m LVOT/AV VTI ratio: 0.74  AORTA Ao Root diam: 2.60 cm Ao Asc diam:  2.70 cm MV E velocity: 12.80 cm/s  TRICUSPID VALVE                            TR Peak grad:   22.8 mmHg                            TR Vmax:        239.00 cm/s                             SHUNTS                            Systemic  VTI:  0.22 m                            Systemic Diam: 2.10 cm Jerel Balding MD Electronically signed by Jerel Balding MD Signature Date/Time: 01/23/2024/1:10:19 PM    Final    DG Chest Port 1 View Result Date: 01/21/2024 CLINICAL DATA:  Fever and sepsis EXAM: PORTABLE CHEST 1 VIEW COMPARISON:  01/15/2024 FINDINGS: Cardiopericardial silhouette is at upper limits of normal for size. Streaky opacity in the left base suggest atelectasis. No dense focal airspace consolidation. No pulmonary edema or pleural effusion. No acute bony abnormality. IMPRESSION: Streaky opacity in the left base suggests atelectasis. Electronically Signed   By: Camellia Candle M.D.   On: 01/21/2024 06:26   DG Chest 2 View Result Date: 01/15/2024 CLINICAL DATA:  18 year old female with sickle cell disease and chest pain. EXAM: CHEST - 2 VIEW COMPARISON:  CTA chest 12/11/2023. Chest radiographs 01/05/2024 and earlier. FINDINGS: AP and lateral views of the chest 0820 hours. Lower lung volumes. Stable mild cardiomegaly. Other mediastinal contours are within normal limits. Visualized tracheal air column is within normal limits. No pneumothorax, pleural effusion, pulmonary edema, confluent lung opacity. Stable lung markings. Stable visualized osseous structures. Intermittent H-shaped vertebral body deformities better demonstrated by CTA. Negative visible bowel gas. IMPRESSION: Lower lung volumes. No acute  cardiopulmonary abnormality. Sequelae of sickle cell disease. Electronically Signed   By: VEAR Hurst M.D.   On: 01/15/2024 08:39   DG Chest 2 View Result Date: 01/05/2024 CLINICAL DATA:  Shortness of breath. EXAM: CHEST - 2 VIEW COMPARISON:  None Available. FINDINGS: The heart size and mediastinal contours are within normal limits. Both lungs are clear. The visualized skeletal structures are unremarkable. IMPRESSION: No active cardiopulmonary disease. Electronically Signed   By: Lynwood Landy Raddle M.D.   On: 01/05/2024 08:06   MR BRAIN W WO CONTRAST Result Date: 01/02/2024 EXAM: MRI BRAIN WITH AND WITHOUT CONTRAST 01/02/2024 03:45:45 PM TECHNIQUE: Multiplanar multisequence MRI of the head/brain was performed with and without the administration of intravenous contrast. 7mL gadavist  (gadobutrol  1 mmol/mL). COMPARISON: CT head 12/31/2023. CLINICAL HISTORY: Seizure, new-onset, no history of trauma. FINDINGS: BRAIN AND VENTRICLES: No acute infarct. No acute intracranial hemorrhage. No mass effect or midline shift. The ventricles are normal in size. The cerebellar tonsils extend 18 mm below the foramen magnum and are compressed. The CSF spaces at the foramen magnum are effaced. No brain sagging. The brain is normal in signal, and no abnormal enhancement is identified . Dedicated thin section imaging through the temporal lobes demonstrates normal volume and signal of the hippocampi. There is no evidence of heterotopia or cortical dysplasia. ORBITS: No acute abnormality. SINUSES: No acute abnormality. BONES AND SOFT TISSUES: Diffusely diminished bone marrow T1 signal intensity, nonspecific but compatible with the history of sickle cell disease. IMPRESSION: 1. Chiari I malformation. 2. Otherwise negative brain MRI. Electronically signed by: Dasie Hamburg MD 01/02/2024 04:29 PM EDT RP Workstation: [id number]   EEG adult Result Date: 01/01/2024 Shelton Arlin KIDD, MD     01/01/2024  8:44 PM Patient Name: NALINA YEATMAN MRN:  [id number] Epilepsy Attending: Arlin KIDD Shelton Referring Physician/Provider: Alfornia Madison, MD Date: 01/01/2024 Duration: 27.29 mins Patient history: 18 y.o. female with possible seizure-like activity. EEG to evaluate for seizure. Level of alertness: Awake AEDs during EEG study: None Technical aspects: This EEG study was done with scalp electrodes positioned according to the 10-20 International system of electrode placement.  Electrical activity was reviewed with band pass filter of 1-70Hz , sensitivity of 7 uV/mm, display speed of 42mm/sec with a 60Hz  notched filter applied as appropriate. EEG data were recorded continuously and digitally stored.  Video monitoring was available and reviewed as appropriate. Description: Per EEG tech, could not get all electrodes under 10 ohms due to condition of pts scalp. O1 P3 Pz P4 C3 and Cz will not reach scalp despite multiple attempts. Hyperventilation and photic stimulation were not performed.   IMPRESSION: This study was not interpretable due to significant electrode artifact. Arlin MALVA Krebs   DG Chest 2 View Result Date: 12/31/2023 EXAM: 2 VIEW(S) XRAY OF THE CHEST 12/31/2023 10:02:00 PM COMPARISON: None available. CLINICAL HISTORY: Evaluate for acute chest, sickle cell. Sickle cell. FINDINGS: LUNGS AND PLEURA: No focal pulmonary opacity. No pulmonary edema. No pleural effusion. No pneumothorax. HEART AND MEDIASTINUM: No acute abnormality of the cardiac and mediastinal silhouettes. BONES AND SOFT TISSUES: No acute osseous abnormality. IMPRESSION: 1. No acute process. Electronically signed by: Franky Stanford MD 12/31/2023 10:12 PM EDT RP Workstation: [id number]   CT Head Wo Contrast Result Date: 12/31/2023 EXAM: CT HEAD WITHOUT CONTRAST 12/31/2023 10:07:18 PM TECHNIQUE: CT of the head was performed without the administration of intravenous contrast. Automated exposure control, iterative reconstruction, and/or weight based adjustment of the mA/kV was utilized to  reduce the radiation dose to as low as reasonably achievable. COMPARISON: 03/02/2017 CLINICAL HISTORY: Seizure, sickle cell, history of intracerebral hemorrhage (ICH). Patient brought in by EMS for seizure. Patient was at church and not acting normal. Patient was described to be having a seizure by mother. The patient has a history of seizures, sickle cell, and aneurysms causing strokes per EMS. Patient crying and unable to console at triage. FINDINGS: BRAIN AND VENTRICLES: No acute hemorrhage. Gray-white differentiation is preserved. No hydrocephalus. No extra-axial collection. No mass effect or midline shift. ORBITS: No acute abnormality. SINUSES: No acute abnormality. SOFT TISSUES AND SKULL: No acute soft tissue abnormality. No skull fracture. Cerebellar tonsils extend 12 mm below the foramen magnum consistent with Chiari malformation. IMPRESSION: 1. No acute intracranial abnormality. 2. 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 Electronically signed by: Kevin Herman MD 12/31/2023 10:11 PM EDT RP Workstation: [id number]    Scheduled Meds:  Deferasirox   360 mg Oral BID   enoxaparin  (LOVENOX ) injection  40 mg Subcutaneous Q24H   fluconazole   150 mg Oral Daily   HYDROmorphone    Intravenous Q4H   hydroxyurea   1,500 mg Oral Daily   hydrOXYzine   25 mg Oral BID   pantoprazole   40 mg Oral Daily   prazosin   1 mg Oral QHS   pregabalin   150 mg Oral TID   senna-docusate  1 tablet Oral BID   Continuous Infusions:   ceFAZolin  (ANCEF ) IV 2 g (01/25/24 0508)    Principal Problem:   Sickle cell anemia with pain (HCC) Active Problems:   Sickle cell pain crisis (HCC)   Chronic pain syndrome   Leukocytosis   GAD (generalized anxiety disorder)

## 2024-01-25 NOTE — Plan of Care (Signed)
  Problem: Education: Goal: Knowledge of General Education information will improve Description: Including pain rating scale, medication(s)/side effects and non-pharmacologic comfort measures Outcome: Progressing   Problem: Clinical Measurements: Goal: Ability to maintain clinical measurements within normal limits will improve Outcome: Progressing Goal: Will remain free from infection Outcome: Progressing Goal: Diagnostic test results will improve Outcome: Progressing Goal: Cardiovascular complication will be avoided Outcome: Progressing   Problem: Health Behavior/Discharge Planning: Goal: Ability to manage health-related needs will improve Outcome: Not Progressing   Problem: Clinical Measurements: Goal: Respiratory complications will improve Outcome: Not Progressing

## 2024-01-26 ENCOUNTER — Other Ambulatory Visit (HOSPITAL_COMMUNITY): Payer: Self-pay

## 2024-01-26 DIAGNOSIS — D5700 Hb-SS disease with crisis, unspecified: Secondary | ICD-10-CM | POA: Diagnosis not present

## 2024-01-26 LAB — CBC
HCT: 25.1 % — ABNORMAL LOW (ref 36.0–46.0)
Hemoglobin: 8.4 g/dL — ABNORMAL LOW (ref 12.0–15.0)
MCH: 31.7 pg (ref 26.0–34.0)
MCHC: 33.5 g/dL (ref 30.0–36.0)
MCV: 94.7 fL (ref 80.0–100.0)
Platelets: 424 K/uL — ABNORMAL HIGH (ref 150–400)
RBC: 2.65 MIL/uL — ABNORMAL LOW (ref 3.87–5.11)
RDW: 26.6 % — ABNORMAL HIGH (ref 11.5–15.5)
WBC: 11.2 K/uL — ABNORMAL HIGH (ref 4.0–10.5)
nRBC: 6.9 % — ABNORMAL HIGH (ref 0.0–0.2)

## 2024-01-26 LAB — COMPREHENSIVE METABOLIC PANEL WITH GFR
ALT: 99 U/L — ABNORMAL HIGH (ref 0–44)
AST: 115 U/L — ABNORMAL HIGH (ref 15–41)
Albumin: 4 g/dL (ref 3.5–5.0)
Alkaline Phosphatase: 208 U/L — ABNORMAL HIGH (ref 38–126)
Anion gap: 12 (ref 5–15)
BUN: 10 mg/dL (ref 6–20)
CO2: 24 mmol/L (ref 22–32)
Calcium: 9.7 mg/dL (ref 8.9–10.3)
Chloride: 101 mmol/L (ref 98–111)
Creatinine, Ser: 0.51 mg/dL (ref 0.44–1.00)
GFR, Estimated: >60 mL/min (ref >60)
Glucose, Bld: 115 mg/dL — ABNORMAL HIGH (ref 70–99)
Potassium: 4.1 mmol/L (ref 3.5–5.1)
Sodium: 137 mmol/L (ref 135–145)
Total Bilirubin: 3 mg/dL — ABNORMAL HIGH (ref 0.0–1.2)
Total Protein: 7.6 g/dL (ref 6.5–8.1)

## 2024-01-26 LAB — CREATININE, SERUM
Creatinine, Ser: 0.51 mg/dL (ref 0.44–1.00)
GFR, Estimated: >60 mL/min (ref >60)

## 2024-01-26 MED ORDER — CEFAZOLIN IV (FOR PTA / DISCHARGE USE ONLY): 2.0000 g | Freq: Three times a day (TID) | INTRAVENOUS | 0 refills | Status: AC

## 2024-01-26 MED ORDER — HEPARIN SOD (PORK) LOCK FLUSH 100 UNIT/ML IV SOLN
250.0000 [IU] | INTRAVENOUS | Status: AC | PRN
  Administered 2024-01-26: 250 [IU]

## 2024-01-26 MED ORDER — LINEZOLID 600 MG PO TABS
600.0000 mg | ORAL_TABLET | Freq: Two times a day (BID) | ORAL | 0 refills | Status: DC
Start: 2024-02-07 — End: 2024-02-29
  Filled 2024-01-26: qty 28, 14d supply, fill #0

## 2024-01-26 NOTE — Progress Notes (Signed)
 PCA discontinued due to discharge, notified to hold discharge temporarily by [facility] to make sure pt has everything she needs for at home antibiotics, IV team consult placed for cap and flush of PICC line.

## 2024-01-26 NOTE — Discharge Summary (Signed)
 Physician Discharge Summary   Patient: Bailey Reese MRN: [id number] DOB: 07/31/2005  Admit date:     01/15/2024  Discharge date: 01/26/2024  Discharge Physician: SIM KNOLL   PCP: Morgan Slater Pizza, MD   Recommendations at discharge:   Please complete IV antibiotics and follow-up with infectious disease as recommended  Discharge Diagnoses: Principal Problem:   Sickle cell anemia with pain (HCC) Active Problems:   Sickle cell pain crisis (HCC)   Chronic pain syndrome   Leukocytosis   GAD (generalized anxiety disorder)  Resolved Problems:   * No resolved hospital problems. *  Hospital Course: Bailey Reese  is a 18 y.o. female, with medical history significant of sickle cell disease, chronic pain, history of hemorrhagic stroke in 2017, asthma, PTSD, depression, anxiety, GERD presenting from [facility] ED due to concern for sickle cell pain crisis. She states that she went out with friends yesterday night and may have overdid it with physical activity.  Reports that another factor that may be contributing was that she used to be on oxycodone  for sickle cell pain but her primary care physician stopped it a few months ago and subsequently she has been hospitalized since.  Her last hospitalization was at [facility] with a discharge date of 01/01/2024.  Denies any fevers or chills, cough congestion, shortness of breath or changes in urinary bowel habits.   Patient was admitted and started on treatment for sickle cell pain crisis.  She was on Dilaudid  PCA, Toradol , oral oxycodone .  Patient was doing much better and was getting ready for discharge when she became septic.  She had a temperature of 103 with leukocytosis and tachycardia.  Subsequently blood cultures were obtained.  She was empirically started on vancomycin  and cefepime .  Her IV line was removed at that point she was noted to have purulent materials from the IV site.  Blood cultures grew MSSA.  TTE was performed and infectious  disease consulted.  Subsequently continued on vancomycin  but later switched to Ancef .  Respiratory viral screen was negative.  Repeat blood cultures were done which was negative.  Sepsis resolved.  Appears to have acute thrombophlebitis which also improved.  Infectious disease recommended 4 weeks of antibiotics treatment.  She will continue IV cefazolin  at home for 14 days.  After that she will transition to linezolid  to complete 4 weeks treatment.  She has an appointment in the infectious disease clinic on October 2.  Home health was set up.  Patient given instructions.  Assessment and Plan: No notes have been filed under this hospital service. Service: Hospitalist        Consultants: Infectious disease Dr. Overton Procedures performed: Chest x-ray x 2 Disposition: Home health Diet recommendation:  Discharge Diet Orders (From admission, onward)     Start     Ordered   01/26/24 0000  Diet - low sodium heart healthy        01/26/24 1443           Regular diet DISCHARGE MEDICATION: Allergies as of 01/26/2024       Reactions   Cherry Anaphylaxis, Itching, Swelling   Olanzapine  Other (See Comments)   Drug-induced liver injury   Other Swelling, Other (See Comments)   Pitted fruits   Peanut-containing Drug Products Anaphylaxis, Itching, Swelling   Plum Pulp Anaphylaxis, Hives   Droperidol  Other (See Comments)   Pt states muscle tension, involuntary movements, eye drift, anxiety   Morphine  And Codeine Hives, Itching   Peach [prunus Persica] Itching, Swelling, Other (See  Comments)   Acetaminophen  Nausea And Vomiting   Compazine  [prochlorperazine ] Other (See Comments), Hypertension   Patient stated that this medication causes her HR to increase and well as BP. She also states that it cause hallucinations.   Ibuprofen  Nausea And Vomiting        Medication List     TAKE these medications    budesonide-formoterol  80-4.5 MCG/ACT inhaler Commonly known as: SYMBICORT Inhale 2  puffs into the lungs in the morning and at bedtime.   buprenorphine  20 MCG/HR Ptwk Commonly known as: BUTRANS  Place 1 patch onto the skin once a week.   CALCIUM 600+D3 PO Take 2 tablets by mouth daily.   ceFAZolin  IVPB Commonly known as: ANCEF  Inject 2 g into the vein every 8 (eight) hours for 11 days. Indication:  MSSA bacteremia First Dose: Yes Last Day of Therapy:  02/06/24 Labs - Once weekly:  CBC/D and BMP, Labs - Once weekly: ESR and CRP Method of administration: IV Push Method of administration may be changed at the discretion of home infusion pharmacist based upon assessment of the patient and/or caregiver's ability to self-administer the medication ordered.   Deferasirox  360 MG Tabs Take 360 mg by mouth 2 (two) times daily.   ergocalciferol 1.25 MG (50000 UT) capsule Commonly known as: VITAMIN D2 Take 50,000 Units by mouth once a week.   esomeprazole  40 MG capsule Commonly known as: NEXIUM  Take 1 capsule (40 mg total) by mouth daily as needed (reflux).   fexofenadine 180 MG tablet Commonly known as: ALLEGRA Take 180 mg by mouth daily as needed for allergies.   hydroxyurea  500 MG capsule Commonly known as: HYDREA  Take 1,500 mg by mouth daily. May take with food to minimize GI side effects.   hydrOXYzine  25 MG tablet Commonly known as: ATARAX  Take 25 mg by mouth See admin instructions. Take 25mg  (1 tablet) by mouth twice daily and an extra tablet if needed.   linezolid  600 MG tablet Commonly known as: ZYVOX  Take 1 tablet (600 mg total) by mouth 2 (two) times daily for 14 days. Start on 9/24 after IV antibiotics completed Start taking on: February 07, 2024   lubiprostone  24 MCG capsule Commonly known as: AMITIZA  Take 24 mcg by mouth in the morning and at bedtime.   ondansetron  4 MG tablet Commonly known as: ZOFRAN  Take 4 mg by mouth.   Oxycodone  HCl 10 MG Tabs Take 1 tablet (10 mg total) by mouth every 6 (six) hours as needed (pain).   pantoprazole  40  MG tablet Commonly known as: Protonix  Take 1 tablet (40 mg total) by mouth 2 (two) times daily. What changed:  when to take this reasons to take this   prazosin  1 MG capsule Commonly known as: MINIPRESS  Take 1 mg by mouth at bedtime.   pregabalin  150 MG capsule Commonly known as: LYRICA  Take 150 mg by mouth 3 (three) times daily.   scopolamine 1 MG/3DAYS Commonly known as: TRANSDERM-SCOP Place 1 patch onto the skin every 3 (three) days.               Discharge Care Instructions  (From admission, onward)           Start     Ordered   01/26/24 0000  Change dressing on IV access line weekly and PRN  (Home infusion instructions - [facility] )        01/26/24 1321            Discharge Exam: [employer]  01/15/24 1537 01/15/24 1559  Weight: 65 kg 65 kg   Constitutional: NAD, calm, comfortable Eyes: PERRL, lids and conjunctivae normal ENMT: Mucous membranes are moist. Posterior pharynx clear of any exudate or lesions.Normal dentition.  Neck: normal, supple, no masses, no thyromegaly Respiratory: clear to auscultation bilaterally, no wheezing, no crackles. Normal respiratory effort. No accessory muscle use.  Cardiovascular: Regular rate and rhythm, no murmurs / rubs / gallops. No extremity edema. 2+ pedal pulses. No carotid bruits.  Abdomen: no tenderness, no masses palpated. No hepatosplenomegaly. Bowel sounds positive.  Musculoskeletal: Good range of motion, no joint swelling or tenderness, Skin: no rashes, lesions, ulcers. No induration Neurologic: CN 2-12 grossly intact. Sensation intact, DTR normal. Strength 5/5 in all 4.  Psychiatric: Normal judgment and insight. Alert and oriented x 3. Normal mood   Condition at discharge: good  The results of significant diagnostics from this hospitalization (including imaging, microbiology, ancillary and laboratory) are listed below for reference.   Imaging Studies: US  EKG SITE RITE Result Date:  01/25/2024 If Site Rite image not attached, placement could not be confirmed due to current cardiac rhythm.  ECHOCARDIOGRAM COMPLETE Result Date: 01/23/2024    ECHOCARDIOGRAM REPORT   Patient Name:   KEYARAH MCROY Date of Exam: 01/23/2024 Medical Rec #:  [id number]          Height:       64.0 in Accession #:    [id number]         Weight:       143.3 lb Date of Birth:  [birth year]          BSA:          1.698 m Patient Age:    17 years           BP:           105/52 mmHg Patient Gender: F                  HR:           90 bpm. Exam Location:  Inpatient Procedure: 2D Echo, Cardiac Doppler and Color Doppler (Both Spectral and Color            Flow Doppler were utilized during procedure). Indications:    Bacteremia  History:        Patient has no prior history of Echocardiogram examinations.                 Sickle Cell Anemia.  Sonographer:    Therisa Crouch Referring Phys: [id number] Miki Blank L Sruthi Maurer IMPRESSIONS  1. Left ventricular ejection fraction, by estimation, is 55 to 60%. The left ventricle has normal function. The left ventricle has no regional wall motion abnormalities. Indeterminate diastolic filling due to E-A fusion.  2. Right ventricular systolic function is normal. The right ventricular size is normal. There is normal pulmonary artery systolic pressure. The estimated right ventricular systolic pressure is 25.8 mmHg.  3. The mitral valve is normal in structure. Trivial mitral valve regurgitation.  4. The aortic valve is tricuspid. Aortic valve regurgitation is not visualized. No aortic stenosis is present.  5. The inferior vena cava is normal in size with greater than 50% respiratory variability, suggesting right atrial pressure of 3 mmHg. Conclusion(s)/Recommendation(s): No evidence of valvular vegetations on this transthoracic echocardiogram. Consider a transesophageal echocardiogram to exclude infective endocarditis if clinically indicated. FINDINGS  Left Ventricle: Left ventricular ejection fraction, by  estimation, is 55 to 60%. The left ventricle has normal function. The  left ventricle has no regional wall motion abnormalities. The left ventricular internal cavity size was normal in size. There is  no left ventricular hypertrophy. Indeterminate diastolic filling due to E-A fusion. Right Ventricle: The right ventricular size is normal. No increase in right ventricular wall thickness. Right ventricular systolic function is normal. There is normal pulmonary artery systolic pressure. The tricuspid regurgitant velocity is 2.39 m/s, and  with an assumed right atrial pressure of 3 mmHg, the estimated right ventricular systolic pressure is 25.8 mmHg. Left Atrium: Left atrial size was normal in size. Right Atrium: Right atrial size was normal in size. Pericardium: There is no evidence of pericardial effusion. Mitral Valve: The mitral valve is normal in structure. Trivial mitral valve regurgitation. Tricuspid Valve: The tricuspid valve is normal in structure. Tricuspid valve regurgitation is mild. Aortic Valve: The aortic valve is tricuspid. Aortic valve regurgitation is not visualized. No aortic stenosis is present. Aortic valve mean gradient measures 6.0 mmHg. Aortic valve peak gradient measures 10.6 mmHg. Aortic valve area, by VTI measures 2.57  cm. Pulmonic Valve: The pulmonic valve was normal in structure. Pulmonic valve regurgitation is trivial. No evidence of pulmonic stenosis. Aorta: The aortic root and ascending aorta are structurally normal, with no evidence of dilitation. Venous: The inferior vena cava is normal in size with greater than 50% respiratory variability, suggesting right atrial pressure of 3 mmHg. IAS/Shunts: No atrial level shunt detected by color flow Doppler.  LEFT VENTRICLE PLAX 2D LVIDd:         5.10 cm   Diastology LVIDs:         3.10 cm   LV e' lateral:   11.30 cm/s LV PW:         0.90 cm   LV E/e' lateral: 1.1 LV IVS:        1.00 cm LVOT diam:     2.10 cm LV SV:         75 LV SV Index:   44  LVOT Area:     3.46 cm  RIGHT VENTRICLE            IVC RV Basal diam:  3.90 cm    IVC diam: 1.50 cm RV S prime:     9.90 cm/s TAPSE (M-mode): 2.0 cm LEFT ATRIUM             Index        RIGHT ATRIUM           Index LA diam:        3.20 cm 1.88 cm/m   RA Area:     14.90 cm LA Vol (A2C):   43.8 ml 25.80 ml/m  RA Volume:   38.30 ml  22.56 ml/m LA Vol (A4C):   41.7 ml 24.56 ml/m LA Biplane Vol: 46.1 ml 27.15 ml/m  AORTIC VALVE AV Area (Vmax):    2.51 cm AV Area (Vmean):   2.43 cm AV Area (VTI):     2.57 cm AV Vmax:           163.00 cm/s AV Vmean:          114.000 cm/s AV VTI:            0.291 m AV Peak Grad:      10.6 mmHg AV Mean Grad:      6.0 mmHg LVOT Vmax:         118.00 cm/s LVOT Vmean:        79.900 cm/s LVOT VTI:  0.216 m LVOT/AV VTI ratio: 0.74  AORTA Ao Root diam: 2.60 cm Ao Asc diam:  2.70 cm MV E velocity: 12.80 cm/s  TRICUSPID VALVE                            TR Peak grad:   22.8 mmHg                            TR Vmax:        239.00 cm/s                             SHUNTS                            Systemic VTI:  0.22 m                            Systemic Diam: 2.10 cm Jerel Croitoru MD Electronically signed by Jerel Balding MD Signature Date/Time: 01/23/2024/1:10:19 PM    Final    DG Chest Port 1 View Result Date: 01/21/2024 CLINICAL DATA:  Fever and sepsis EXAM: PORTABLE CHEST 1 VIEW COMPARISON:  01/15/2024 FINDINGS: Cardiopericardial silhouette is at upper limits of normal for size. Streaky opacity in the left base suggest atelectasis. No dense focal airspace consolidation. No pulmonary edema or pleural effusion. No acute bony abnormality. IMPRESSION: Streaky opacity in the left base suggests atelectasis. Electronically Signed   By: Camellia Candle M.D.   On: 01/21/2024 06:26   DG Chest 2 View Result Date: 01/15/2024 CLINICAL DATA:  18 year old female with sickle cell disease and chest pain. EXAM: CHEST - 2 VIEW COMPARISON:  CTA chest 12/11/2023. Chest radiographs 01/05/2024 and  earlier. FINDINGS: AP and lateral views of the chest 0820 hours. Lower lung volumes. Stable mild cardiomegaly. Other mediastinal contours are within normal limits. Visualized tracheal air column is within normal limits. No pneumothorax, pleural effusion, pulmonary edema, confluent lung opacity. Stable lung markings. Stable visualized osseous structures. Intermittent H-shaped vertebral body deformities better demonstrated by CTA. Negative visible bowel gas. IMPRESSION: Lower lung volumes. No acute cardiopulmonary abnormality. Sequelae of sickle cell disease. Electronically Signed   By: VEAR Hurst M.D.   On: 01/15/2024 08:39   DG Chest 2 View Result Date: 01/05/2024 CLINICAL DATA:  Shortness of breath. EXAM: CHEST - 2 VIEW COMPARISON:  None Available. FINDINGS: The heart size and mediastinal contours are within normal limits. Both lungs are clear. The visualized skeletal structures are unremarkable. IMPRESSION: No active cardiopulmonary disease. Electronically Signed   By: Lynwood Landy Raddle M.D.   On: 01/05/2024 08:06   MR BRAIN W WO CONTRAST Result Date: 01/02/2024 EXAM: MRI BRAIN WITH AND WITHOUT CONTRAST 01/02/2024 03:45:45 PM TECHNIQUE: Multiplanar multisequence MRI of the head/brain was performed with and without the administration of intravenous contrast. 7mL gadavist  (gadobutrol  1 mmol/mL). COMPARISON: CT head 12/31/2023. CLINICAL HISTORY: Seizure, new-onset, no history of trauma. FINDINGS: BRAIN AND VENTRICLES: No acute infarct. No acute intracranial hemorrhage. No mass effect or midline shift. The ventricles are normal in size. The cerebellar tonsils extend 18 mm below the foramen magnum and are compressed. The CSF spaces at the foramen magnum are effaced. No brain sagging. The brain is normal in signal, and no abnormal enhancement is identified . Dedicated thin section imaging through the  temporal lobes demonstrates normal volume and signal of the hippocampi. There is no evidence of heterotopia or cortical  dysplasia. ORBITS: No acute abnormality. SINUSES: No acute abnormality. BONES AND SOFT TISSUES: Diffusely diminished bone marrow T1 signal intensity, nonspecific but compatible with the history of sickle cell disease. IMPRESSION: 1. Chiari I malformation. 2. Otherwise negative brain MRI. Electronically signed by: Dasie Hamburg MD 01/02/2024 04:29 PM EDT RP Workstation: [id number]   EEG adult Result Date: 01/01/2024 Shelton Arlin KIDD, MD     01/01/2024  8:44 PM Patient Name: ANNIBELLE BRAZIE MRN: [id number] Epilepsy Attending: Arlin KIDD Shelton Referring Physician/Provider: Alfornia Madison, MD Date: 01/01/2024 Duration: 27.29 mins Patient history: 18 y.o. female with possible seizure-like activity. EEG to evaluate for seizure. Level of alertness: Awake AEDs during EEG study: None Technical aspects: This EEG study was done with scalp electrodes positioned according to the 10-20 International system of electrode placement. Electrical activity was reviewed with band pass filter of 1-70Hz , sensitivity of 7 uV/mm, display speed of 75mm/sec with a 60Hz  notched filter applied as appropriate. EEG data were recorded continuously and digitally stored.  Video monitoring was available and reviewed as appropriate. Description: Per EEG tech, could not get all electrodes under 10 ohms due to condition of pts scalp. O1 P3 Pz P4 C3 and Cz will not reach scalp despite multiple attempts. Hyperventilation and photic stimulation were not performed.   IMPRESSION: This study was not interpretable due to significant electrode artifact. Arlin KIDD Shelton   DG Chest 2 View Result Date: 12/31/2023 EXAM: 2 VIEW(S) XRAY OF THE CHEST 12/31/2023 10:02:00 PM COMPARISON: None available. CLINICAL HISTORY: Evaluate for acute chest, sickle cell. Sickle cell. FINDINGS: LUNGS AND PLEURA: No focal pulmonary opacity. No pulmonary edema. No pleural effusion. No pneumothorax. HEART AND MEDIASTINUM: No acute abnormality of the cardiac and mediastinal  silhouettes. BONES AND SOFT TISSUES: No acute osseous abnormality. IMPRESSION: 1. No acute process. Electronically signed by: Franky Stanford MD 12/31/2023 10:12 PM EDT RP Workstation: [id number]   CT Head Wo Contrast Result Date: 12/31/2023 EXAM: CT HEAD WITHOUT CONTRAST 12/31/2023 10:07:18 PM TECHNIQUE: CT of the head was performed without the administration of intravenous contrast. Automated exposure control, iterative reconstruction, and/or weight based adjustment of the mA/kV was utilized to reduce the radiation dose to as low as reasonably achievable. COMPARISON: 03/02/2017 CLINICAL HISTORY: Seizure, sickle cell, history of intracerebral hemorrhage (ICH). Patient brought in by EMS for seizure. Patient was at church and not acting normal. Patient was described to be having a seizure by mother. The patient has a history of seizures, sickle cell, and aneurysms causing strokes per EMS. Patient crying and unable to console at triage. FINDINGS: BRAIN AND VENTRICLES: No acute hemorrhage. Gray-white differentiation is preserved. No hydrocephalus. No extra-axial collection. No mass effect or midline shift. ORBITS: No acute abnormality. SINUSES: No acute abnormality. SOFT TISSUES AND SKULL: No acute soft tissue abnormality. No skull fracture. Cerebellar tonsils extend 12 mm below the foramen magnum consistent with Chiari malformation. IMPRESSION: 1. No acute intracranial abnormality. 2. 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 Electronically signed by: Franky Stanford MD 12/31/2023 10:11 PM EDT RP Workstation: [id number]    Microbiology: Results for orders placed or performed during the hospital encounter of 01/15/24  Culture, blood (Routine X 2) w Reflex to ID Panel     Status: Abnormal   Collection Time: 01/21/24 12:03 AM   Specimen: BLOOD LEFT ARM  Result Value Ref Range Status   Specimen Description  Final    BLOOD LEFT ARM Performed at [facility],  [address], [place], [place] [address]    Special Requests   Final    BOTTLES DRAWN AEROBIC AND ANAEROBIC Blood Culture results may not be optimal due to an inadequate volume of blood received in culture bottles Performed at [facility], [address] [place], [place] [address]    Culture  Setup Time   Final    GRAM POSITIVE COCCI IN BOTH AEROBIC AND ANAEROBIC BOTTLES CRITICAL RESULT CALLED TO, READ BACK BY AND VERIFIED WITH: PHARMD JUSTIN FREDRIK [id number] AT 2056, ADC Performed at [facility], [address], [place], [place] [address]    Culture STAPHYLOCOCCUS AUREUS (A)  Final   Report Status 01/24/2024 FINAL  Final   Organism ID, Bacteria STAPHYLOCOCCUS AUREUS  Final      Susceptibility   Staphylococcus aureus - MIC*    CIPROFLOXACIN <=0.5 SENSITIVE Sensitive     ERYTHROMYCIN >=8 RESISTANT Resistant     GENTAMICIN <=0.5 SENSITIVE Sensitive     OXACILLIN <=0.25 SENSITIVE Sensitive     TETRACYCLINE <=1 SENSITIVE Sensitive     VANCOMYCIN  1 SENSITIVE Sensitive     TRIMETH/SULFA <=10 SENSITIVE Sensitive     CLINDAMYCIN RESISTANT Resistant     RIFAMPIN <=0.5 SENSITIVE Sensitive     Inducible Clindamycin POSITIVE Resistant     LINEZOLID  2 SENSITIVE Sensitive     * STAPHYLOCOCCUS AUREUS  Culture, blood (Routine X 2) w Reflex to ID Panel     Status: Abnormal   Collection Time: 01/21/24 12:03 AM   Specimen: BLOOD LEFT HAND  Result Value Ref Range Status   Specimen Description   Final    BLOOD LEFT HAND Performed at [facility], [address] [place], [place] [address]    Special Requests   Final    BOTTLES DRAWN AEROBIC AND ANAEROBIC Blood Culture results may not be optimal due to an inadequate volume of blood received in culture bottles Performed at [facility], [address], [place], [place] [address]    Culture  Setup Time   Final    GRAM POSITIVE COCCI IN CLUSTERS IN BOTH AEROBIC AND ANAEROBIC BOTTLES CRITICAL VALUE NOTED.   VALUE IS CONSISTENT WITH PREVIOUSLY REPORTED AND CALLED VALUE.    Culture (A)  Final    STAPHYLOCOCCUS AUREUS SUSCEPTIBILITIES PERFORMED ON PREVIOUS CULTURE WITHIN THE LAST 5 DAYS. Performed at [facility], [address] [place], [place] [address]    Report Status 01/23/2024 FINAL  Final  Blood Culture ID Panel (Reflexed)     Status: Abnormal   Collection Time: 01/21/24 12:03 AM  Result Value Ref Range Status   Enterococcus faecalis NOT DETECTED NOT DETECTED Final   Enterococcus Faecium NOT DETECTED NOT DETECTED Final   Listeria monocytogenes NOT DETECTED NOT DETECTED Final   Staphylococcus species DETECTED (A) NOT DETECTED Final    Comment: CRITICAL RESULT CALLED TO, READ BACK BY AND VERIFIED WITH: PHARMD JUSTIN L. [id number] AT 2056, ADC    Staphylococcus aureus (BCID) DETECTED (A) NOT DETECTED Final    Comment: CRITICAL RESULT CALLED TO, READ BACK BY AND VERIFIED WITH: PHARMD JUSTIN L. [id number] AT 2056, ADC    Staphylococcus epidermidis NOT DETECTED NOT DETECTED Final   Staphylococcus lugdunensis NOT DETECTED NOT DETECTED Final   Streptococcus species NOT DETECTED NOT DETECTED Final   Streptococcus agalactiae NOT DETECTED NOT DETECTED Final   Streptococcus pneumoniae NOT DETECTED NOT  DETECTED Final   Streptococcus pyogenes NOT DETECTED NOT DETECTED Final   A.calcoaceticus-baumannii NOT DETECTED NOT DETECTED Final   Bacteroides fragilis NOT DETECTED NOT DETECTED Final   Enterobacterales NOT DETECTED NOT DETECTED Final   Enterobacter cloacae complex NOT DETECTED NOT DETECTED Final   Escherichia coli NOT DETECTED NOT DETECTED Final   Klebsiella aerogenes NOT DETECTED NOT DETECTED Final   Klebsiella oxytoca NOT DETECTED NOT DETECTED Final   Klebsiella pneumoniae NOT DETECTED NOT DETECTED Final   Proteus species NOT DETECTED NOT DETECTED Final   Salmonella species NOT DETECTED NOT DETECTED Final   Serratia marcescens NOT DETECTED NOT DETECTED Final   Haemophilus influenzae NOT  DETECTED NOT DETECTED Final   Neisseria meningitidis NOT DETECTED NOT DETECTED Final   Pseudomonas aeruginosa NOT DETECTED NOT DETECTED Final   Stenotrophomonas maltophilia NOT DETECTED NOT DETECTED Final   Candida albicans NOT DETECTED NOT DETECTED Final   Candida auris NOT DETECTED NOT DETECTED Final   Candida glabrata NOT DETECTED NOT DETECTED Final   Candida krusei NOT DETECTED NOT DETECTED Final   Candida parapsilosis NOT DETECTED NOT DETECTED Final   Candida tropicalis NOT DETECTED NOT DETECTED Final   Cryptococcus neoformans/gattii NOT DETECTED NOT DETECTED Final   Meth resistant mecA/C and MREJ NOT DETECTED NOT DETECTED Final    Comment: Performed at [facility] Lab, [address], [place], [place] [address]  Respiratory (~20 pathogens) panel by PCR     Status: None   Collection Time: 01/21/24  7:19 AM   Specimen: Nasopharyngeal Swab; Respiratory  Result Value Ref Range Status   Adenovirus NOT DETECTED NOT DETECTED Final   Coronavirus 229E NOT DETECTED NOT DETECTED Final    Comment: (NOTE) The Coronavirus on the Respiratory Panel, DOES NOT test for the novel  Coronavirus (2019 nCoV)    Coronavirus HKU1 NOT DETECTED NOT DETECTED Final   Coronavirus NL63 NOT DETECTED NOT DETECTED Final   Coronavirus OC43 NOT DETECTED NOT DETECTED Final   Metapneumovirus NOT DETECTED NOT DETECTED Final   Rhinovirus / Enterovirus NOT DETECTED NOT DETECTED Final   Influenza A NOT DETECTED NOT DETECTED Final   Influenza B NOT DETECTED NOT DETECTED Final   Parainfluenza Virus 1 NOT DETECTED NOT DETECTED Final   Parainfluenza Virus 2 NOT DETECTED NOT DETECTED Final   Parainfluenza Virus 3 NOT DETECTED NOT DETECTED Final   Parainfluenza Virus 4 NOT DETECTED NOT DETECTED Final   Respiratory Syncytial Virus NOT DETECTED NOT DETECTED Final   Bordetella pertussis NOT DETECTED NOT DETECTED Final   Bordetella Parapertussis NOT DETECTED NOT DETECTED Final   Chlamydophila pneumoniae NOT DETECTED NOT  DETECTED Final   Mycoplasma pneumoniae NOT DETECTED NOT DETECTED Final    Comment: Performed at [facility] Lab, [address], [place], [place] [address]  Culture, blood (Routine X 2) w Reflex to ID Panel     Status: None   Collection Time: 01/23/24  5:54 AM   Specimen: BLOOD LEFT HAND  Result Value Ref Range Status   Specimen Description   Final    BLOOD LEFT HAND Performed at [facility], [address] [place], [place] [address]    Special Requests   Final    BOTTLES DRAWN AEROBIC AND ANAEROBIC Blood Culture results may not be optimal due to an inadequate volume of blood received in culture bottles Performed at [facility], [address], [place], [place] [address]    Culture   Final    NO GROWTH 5  DAYS Performed at [facility], [address] [place], [place] [address]    Report Status 01/28/2024 FINAL  Final  Culture, blood (Routine X 2) w Reflex to ID Panel     Status: None   Collection Time: 01/23/24  6:00 AM   Specimen: BLOOD LEFT HAND  Result Value Ref Range Status   Specimen Description   Final    BLOOD LEFT HAND Performed at [facility], [address] [place], [place] [address]    Special Requests   Final    BOTTLES DRAWN AEROBIC AND ANAEROBIC Blood Culture results may not be optimal due to an inadequate volume of blood received in culture bottles Performed at [facility], [address] [place], [place] [address]    Culture   Final    NO GROWTH 5 DAYS Performed at [facility] Lab, [address], [place], [place] [address]    Report Status 01/28/2024 FINAL  Final    Labs: CBC: Recent Labs  Lab 01/23/24 0849 01/24/24 0558 01/26/24 0813  WBC 10.5 10.2 11.2*  HGB 6.2* 8.4* 8.4*  HCT 19.1* 24.0* 25.1*  MCV 104.4* 90.2 94.7  PLT 343 408* 424*   Basic Metabolic Panel: Recent Labs  Lab 01/23/24 0849 01/24/24 0558 01/25/24 0527 01/26/24 0813 01/26/24 0814  NA 137  --   --  137   --   K 4.4  --   --  4.1  --   CL 104  --   --  101  --   CO2 22  --   --  24  --   GLUCOSE 99  --   --  115*  --   BUN 12  --   --  10  --   CREATININE 0.50 0.53 0.53 0.51 0.51  CALCIUM 9.1  --   --  9.7  --    Liver Function Tests: Recent Labs  Lab 01/23/24 0849 01/26/24 0813  AST 63* 115*  ALT 74* 99*  ALKPHOS 154* 208*  BILITOT 3.5* 3.0*  PROT 7.2 7.6  ALBUMIN 3.7 4.0   CBG: No results for input(s): GLUCAP in the last 168 hours.  Discharge time spent: greater than 30 minutes.  SignedBETHA SIM KNOLL, MD [facility] 01/29/2024

## 2024-01-26 NOTE — TOC Transition Note (Addendum)
 Transition of Care [facility]) - Discharge Note   Patient Details  Name: Bailey Reese MRN: [id number] Date of Birth: 12-Apr-2006  Transition of Care [facility]) CM/SW Contact:  Toy LITTIE Agar, RN Phone Number:[phone]  01/26/2024, 3:08 PM   Clinical Narrative:    Patient with discharge orders. CM has confirmed with Pam with [facility] that patient is set up for home IV abx therapy. No other needs noted at this time.      Barriers to Discharge: No Barriers Identified   Patient Goals and CMS Choice            Discharge Placement                       Discharge Plan and Services Additional resources added to the After Visit Summary for                                       Social Drivers of Health (SDOH) Interventions SDOH Screenings   Food Insecurity: Food Insecurity Present (01/15/2024)  Housing: High Risk (01/15/2024)  Transportation Needs: Unmet Transportation Needs (01/15/2024)  Utilities: At Risk (01/15/2024)  Financial Resource Strain: Low Risk  (07/25/2023)   Received from [facility]  Physical Activity: Insufficiently Active (12/10/2021)   Received from [facility]  Social Connections: Socially Isolated (01/15/2024)  Stress: Stress Concern Present (12/10/2021)   Received from [facility]  Tobacco Use: Low Risk  (01/15/2024)     Readmission Risk Interventions    01/16/2024    8:38 AM 01/01/2024    9:35 PM 12/11/2023    3:08 PM  Readmission Risk Prevention Plan  Transportation Screening Complete Complete Complete  Medication Review (RN Care Manager) Complete Complete Referral to Pharmacy  PCP or Specialist appointment within 3-5 days of discharge Complete Complete Complete  HRI or Home Care Consult Complete Complete Complete  SW Recovery Care/Counseling Consult Complete Complete Patient refused  Palliative Care Screening Not Applicable Not Applicable Not Applicable  Skilled Nursing Facility Not Applicable Not  Applicable Not Applicable

## 2024-01-26 NOTE — Progress Notes (Signed)
 Discharge meds in a secure bag delivered to pt in room by this RN

## 2024-01-28 LAB — CULTURE, BLOOD (ROUTINE X 2)
Culture: NO GROWTH
Culture: NO GROWTH

## 2024-01-29 NOTE — Hospital Course (Signed)
 Bailey Reese  is a 18 y.o. female, with medical history significant of sickle cell disease, chronic pain, history of hemorrhagic stroke in 2017, asthma, PTSD, depression, anxiety, GERD presenting from [facility] ED due to concern for sickle cell pain crisis. She states that she went out with friends yesterday night and may have overdid it with physical activity.  Reports that another factor that may be contributing was that she used to be on oxycodone  for sickle cell pain but her primary care physician stopped it a few months ago and subsequently she has been hospitalized since.  Her last hospitalization was at [facility] with a discharge date of 01/01/2024.  Denies any fevers or chills, cough congestion, shortness of breath or changes in urinary bowel habits.   Patient was admitted and started on treatment for sickle cell pain crisis.  She was on Dilaudid  PCA, Toradol , oral oxycodone .  Patient was doing much better and was getting ready for discharge when she became septic.  She had a temperature of 103 with leukocytosis and tachycardia.  Subsequently blood cultures were obtained.  She was empirically started on vancomycin  and cefepime .  Her IV line was removed at that point she was noted to have purulent materials from the IV site.  Blood cultures grew MSSA.  TTE was performed and infectious disease consulted.  Subsequently continued on vancomycin  but later switched to Ancef .  Respiratory viral screen was negative.  Repeat blood cultures were done which was negative.  Sepsis resolved.  Appears to have acute thrombophlebitis which also improved.  Infectious disease recommended 4 weeks of antibiotics treatment.  She will continue IV cefazolin  at home for 14 days.  After that she will transition to linezolid  to complete 4 weeks treatment.  She has an appointment in the infectious disease clinic on October 2.  Home health was set up.  Patient given instructions.

## 2024-01-31 ENCOUNTER — Emergency Department

## 2024-01-31 ENCOUNTER — Emergency Department
Admission: EM | Admit: 2024-01-31 | Discharge: 2024-01-31 | Disposition: A | Discharge status: Home / Self Care | Attending: Emergency Medicine | Admitting: Emergency Medicine

## 2024-01-31 ENCOUNTER — Other Ambulatory Visit: Payer: Self-pay

## 2024-01-31 DIAGNOSIS — B349 Viral infection, unspecified: Secondary | ICD-10-CM | POA: Insufficient documentation

## 2024-01-31 DIAGNOSIS — R0789 Other chest pain: Secondary | ICD-10-CM | POA: Diagnosis not present

## 2024-01-31 DIAGNOSIS — D5700 Hb-SS disease with crisis, unspecified: Secondary | ICD-10-CM | POA: Insufficient documentation

## 2024-01-31 LAB — CBC
HCT: 23.8 % — ABNORMAL LOW (ref 36.0–46.0)
Hemoglobin: 8 g/dL — ABNORMAL LOW (ref 12.0–15.0)
MCH: 31.6 pg (ref 26.0–34.0)
MCHC: 33.6 g/dL (ref 30.0–36.0)
MCV: 94.1 fL (ref 80.0–100.0)
Platelets: 487 K/uL — ABNORMAL HIGH (ref 150–400)
RBC: 2.53 MIL/uL — ABNORMAL LOW (ref 3.87–5.11)
RDW: 23.9 % — ABNORMAL HIGH (ref 11.5–15.5)
WBC: 8.6 K/uL (ref 4.0–10.5)
nRBC: 1.5 % — ABNORMAL HIGH (ref 0.0–0.2)

## 2024-01-31 LAB — TROPONIN I (HIGH SENSITIVITY): Troponin I (High Sensitivity): 3 ng/L (ref <18)

## 2024-01-31 LAB — BASIC METABOLIC PANEL WITH GFR
Anion gap: 7 (ref 5–15)
BUN: 7 mg/dL (ref 6–20)
CO2: 23 mmol/L (ref 22–32)
Calcium: 8.8 mg/dL — ABNORMAL LOW (ref 8.9–10.3)
Chloride: 107 mmol/L (ref 98–111)
Creatinine, Ser: 0.41 mg/dL — ABNORMAL LOW (ref 0.44–1.00)
GFR, Estimated: >60 mL/min (ref >60)
Glucose, Bld: 102 mg/dL — ABNORMAL HIGH (ref 70–99)
Potassium: 3.6 mmol/L (ref 3.5–5.1)
Sodium: 137 mmol/L (ref 135–145)

## 2024-01-31 LAB — RESP PANEL BY RT-PCR (RSV, FLU A&B, COVID)  RVPGX2
Influenza A by PCR: NEGATIVE
Influenza B by PCR: NEGATIVE
Resp Syncytial Virus by PCR: NEGATIVE
SARS Coronavirus 2 by RT PCR: NEGATIVE

## 2024-01-31 MED ORDER — HYDROMORPHONE HCL 1 MG/ML IJ SOLN
1.0000 mg | Freq: Once | INTRAMUSCULAR | Status: AC
  Administered 2024-01-31: 1 mg via INTRAVENOUS
  Filled 2024-01-31: qty 1

## 2024-01-31 MED ORDER — ONDANSETRON HCL 4 MG/2ML IJ SOLN
4.0000 mg | Freq: Once | INTRAMUSCULAR | Status: AC
  Administered 2024-01-31: 4 mg via INTRAVENOUS
  Filled 2024-01-31: qty 2

## 2024-01-31 NOTE — ED Provider Notes (Signed)
 [facility] Provider Note    Event Date/Time   First MD Initiated Contact with Patient 01/31/24 1800     (approximate)   History   Chief Complaint Chest Pain   HPI  Bailey Reese is a 18 y.o. female with past medical history of sickle cell disease, chronic pain syndrome, hemorrhagic stroke, and MSSA bacteremia who presents to the ED complaining of chest pain.  Patient reports that since waking up this morning she has been dealing with a dry cough, sore throat, and generalized fatigue.  She is not aware of any fevers and denies any shortness of breath, does complain of pain all over worsening since the onset of her illness.  She was recently admitted to the hospital for MSSA bacteremia secondary to peripheral IV, has been receiving antibiotics via PICC line.  She denies any issues with antibiotic administration, had been feeling well until this morning.  She is not aware of any sick contacts, denies any abdominal pain, nausea, vomiting, diarrhea, or dysuria.     Physical Exam   Triage Vital Signs: ED Triage Vitals  Encounter Vitals Group     BP 01/31/24 1757 119/77     Girls Systolic BP Percentile --      Girls Diastolic BP Percentile --      Boys Systolic BP Percentile --      Boys Diastolic BP Percentile --      Pulse Rate 01/31/24 1757 71     Resp 01/31/24 1757 16     Temp 01/31/24 1757 98.3 F (36.8 C)     Temp Source 01/31/24 1757 Oral     SpO2 01/31/24 1757 98 %     Weight --      Height --      Head Circumference --      Peak Flow --      Pain Score 01/31/24 1827 7     Pain Loc --      Pain Education --      Exclude from Growth Chart --     Most recent vital signs: Vitals:   01/31/24 1930 01/31/24 2031  BP:  113/70  Pulse: 65   Resp:    Temp:    SpO2: 100%     Constitutional: Alert and oriented. Eyes: Conjunctivae are normal. Head: Atraumatic. Nose: No congestion/rhinnorhea. Mouth/Throat: Mucous membranes are moist.   Posterior oropharynx without erythema, edema, or exudates. Cardiovascular: Normal rate, regular rhythm. Grossly normal heart sounds.  2+ radial pulses bilaterally.  PICC line site intact without erythema, edema, or warmth. Respiratory: Normal respiratory effort.  No retractions. Lungs CTAB. Gastrointestinal: Soft and nontender. No distention. Musculoskeletal: No lower extremity tenderness nor edema.  Neurologic:  Normal speech and language. No gross focal neurologic deficits are appreciated.    ED Results / Procedures / Treatments   Labs (all labs ordered are listed, but only abnormal results are displayed) Labs Reviewed  BASIC METABOLIC PANEL WITH GFR - Abnormal; Notable for the following components:      Result Value   Glucose, Bld 102 (*)    Creatinine, Ser 0.41 (*)    Calcium 8.8 (*)    All other components within normal limits  CBC - Abnormal; Notable for the following components:   RBC 2.53 (*)    Hemoglobin 8.0 (*)    HCT 23.8 (*)    RDW 23.9 (*)    Platelets 487 (*)    nRBC 1.5 (*)    All other  components within normal limits  RESP PANEL BY RT-PCR (RSV, FLU A&B, COVID)  [id number]  POC URINE PREG, ED  TROPONIN I (HIGH SENSITIVITY)     EKG  ED ECG REPORT I, Carlin Palin, the attending physician, personally viewed and interpreted this ECG.   Date: 01/31/2024  EKG Time: 9:15  Rate: 66  Rhythm: normal sinus rhythm  Axis: Normal  Intervals:none  ST&T Change: None  RADIOLOGY Chest x-ray reviewed and interpreted by me with no infiltrate, Dema, or effusion.  PROCEDURES:  Critical Care performed: No  Procedures   MEDICATIONS ORDERED IN ED: Medications  HYDROmorphone  (DILAUDID ) injection 1 mg (1 mg Intravenous Given 01/31/24 1856)  ondansetron  (ZOFRAN ) injection 4 mg (4 mg Intravenous Given 01/31/24 1937)  HYDROmorphone  (DILAUDID ) injection 1 mg (1 mg Intravenous Given 01/31/24 2031)  HYDROmorphone  (DILAUDID ) injection 1 mg (1 mg Intravenous Given 01/31/24 2108)      IMPRESSION / MDM / ASSESSMENT AND PLAN / ED COURSE  I reviewed the triage vital signs and the nursing notes.                              18 y.o. female with past medical history of sickle cell disease, chronic pain syndrome, hemorrhagic stroke, and recent MSSA bacteremia from peripheral IV placement who presents to the ED complaining of cough, sore throat, fatigue, and acute on chronic pain since this morning.  Patient's presentation is most consistent with acute presentation with potential threat to life or bodily function.  Differential diagnosis includes, but is not limited to, COVID-19, influenza, pneumonia, viral syndrome, pharyngitis, ACS, PE, anemia, electrolyte abnormality, AKI, sickle cell pain crisis, chronic pain syndrome.  Patient nontoxic-appearing and in no acute distress, vital signs are reassuring and do not appear concerning for sepsis.  Low suspicion for recurrent bacteremia given reassuring vital signs, symptoms seem most consistent with viral syndrome but will screen chest x-ray given cough.  COVID and flu testing is pending, labs also pending at this time.  We will treat pain with IV Dilaudid  and reassess following results.  Chest x-ray is unremarkable, COVID and flu testing is negative.  Labs with stable chronic anemia and no significant leukocytosis, electrolyte abnormality, or AKI.  Troponin within normal limits and I doubt ACS or PE, suspect acute on chronic pain due to sickle cell disease, potentially brought on by viral illness.  Pain improving following additional doses of IV pain medication and patient appropriate for discharge home with outpatient hematology follow-up.  She was counseled to return to the ED for new or worsening symptoms, patient agrees with plan.      FINAL CLINICAL IMPRESSION(S) / ED DIAGNOSES   Final diagnoses:  Atypical chest pain  Viral syndrome  Sickle cell pain crisis (HCC)     Rx / DC Orders   ED Discharge Orders     None         Note:  This document was prepared using Dragon voice recognition software and may include unintentional dictation errors.   Palin Carlin, MD 01/31/24 2126

## 2024-01-31 NOTE — ED Triage Notes (Addendum)
 Pt to ED via [facility] from home. Pt reports pain all over. Hx of sickle cell. Pt reports does not feel like sickle cell crisis.   Hx of stroke and seizures. PICC line ion left arm  CBG 128 98.3 oral 116/56 77 HR  96% RA

## 2024-01-31 NOTE — ED Triage Notes (Signed)
 C/o CP that started this am, constant in nature. Pt was dx with MRSA, doing IV ABX @home  in her PICC. Pt reports new onset cough and fatigue. GCS 15. Pt reports generalized pain. PMH: sickle cell, seizure, hemorrhagic stroke In 2017. GCS 15

## 2024-02-09 ENCOUNTER — Encounter: Payer: Self-pay | Admitting: Nurse Practitioner

## 2024-02-09 ENCOUNTER — Ambulatory Visit (INDEPENDENT_AMBULATORY_CARE_PROVIDER_SITE_OTHER): Payer: Self-pay | Admitting: Nurse Practitioner

## 2024-02-09 VITALS — BP 100/57 | HR 78 | Temp 97.5°F | Ht 64.0 in | Wt 155.0 lb

## 2024-02-09 DIAGNOSIS — D571 Sickle-cell disease without crisis: Secondary | ICD-10-CM

## 2024-02-09 MED ORDER — OXYCODONE HCL 10 MG PO TABS: 10.0000 mg | ORAL_TABLET | Freq: Four times a day (QID) | ORAL | 0 refills | Status: DC | PRN

## 2024-02-09 MED ORDER — PREGABALIN 150 MG PO CAPS: 150.0000 mg | ORAL_CAPSULE | Freq: Three times a day (TID) | ORAL | 0 refills | Status: DC

## 2024-02-09 NOTE — Progress Notes (Signed)
 Subjective   Patient ID: Bailey Reese, female    DOB: Jun 13, 2005, 18 y.o.   MRN: [id number]  Chief Complaint  Patient presents with   Establish Care    Pt presents today to establish care.    Medical Management of Chronic Issues    Pt would like to discuss pain management and long term care      Referring provider: Little, Slater Pizza, MD  Bailey Reese is a 18 y.o. female with Past Medical History: No date: Anxiety No date: Depression No date: PTSD (post-traumatic stress disorder) No date: Sickle cell anemia (HCC)  HPI  Patient presents today to establish care.  She was a previous patient with [facility] for sickle cell.  Patient was previously on oxycodone  for sickle cell pain.  Patient is currently on buprenorphine  patches for pain.  She states that these are not working.  We discussed that she does need a referral to hematology and we will try to refer her to [facility].  We did discuss the process of going to the day hospital with her. Denies f/c/s, n/v/d, hemoptysis, PND, leg swelling Denies chest pain or edema     Allergies  Allergen Reactions   Cherry Anaphylaxis, Itching and Swelling   Olanzapine  Other (See Comments)    Drug-induced liver injury   Other Swelling and Other (See Comments)    Pitted fruits   Peanut-Containing Drug Products Anaphylaxis, Itching and Swelling   Plum Pulp Anaphylaxis and Hives   Droperidol  Other (See Comments)    Pt states muscle tension, involuntary movements, eye drift, anxiety   Morphine  And Codeine Hives and Itching   Peach [Prunus Persica] Itching, Swelling and Other (See Comments)   Acetaminophen  Nausea And Vomiting   Compazine  [Prochlorperazine ] Other (See Comments) and Hypertension    Patient stated that this medication causes her HR to increase and well as BP. She also states that it cause hallucinations.   Ibuprofen  Nausea And Vomiting    Immunization History  Administered Date(s) Administered   Dtap, Unspecified  05/18/2004, 07/16/2004, 09/17/2004, 06/23/2005, 08/25/2009   HIB, Unspecified 05/18/2004, 07/19/2004, 03/23/2005   Hep A, Unspecified 09/20/2005, 03/22/2006   Hep B, Unspecified 08-Jul-2003, 05/18/2004, 07/16/2004, 09/17/2004   IPV 05/18/2004, 07/16/2004, 09/17/2004, 05/13/2009   Influenza-Unspecified 02/13/2021   MMR 06/23/2005, 08/25/2009   Meningococcal Mcv4,unspecified 09/27/2006, 10/28/2010   Pneumococcal Conjugate PCV 7 05/18/2004, 07/16/2004, 09/17/2004, 03/23/2005   Pneumococcal Conjugate-13 08/25/2009, 10/28/2010   Pneumococcal Polysaccharide-23 03/29/2006, 05/13/2009   Pneumococcal-Unspecified 05/13/2009   Varicella 03/23/2005, 08/25/2009    Tobacco History: Social History   Tobacco Use  Smoking Status Never  Smokeless Tobacco Never   Counseling given: Not Answered   Outpatient Encounter Medications as of 02/09/2024  Medication Sig   budesonide-formoterol  (SYMBICORT) 80-4.5 MCG/ACT inhaler Inhale 2 puffs into the lungs in the morning and at bedtime.   buprenorphine  (BUTRANS ) 20 MCG/HR PTWK Place 1 patch onto the skin once a week.   Calcium Carb-Cholecalciferol (CALCIUM 600+D3 PO) Take 2 tablets by mouth daily.   Deferasirox  360 MG TABS Take 360 mg by mouth 2 (two) times daily.   ergocalciferol (VITAMIN D2) 1.25 MG (50000 UT) capsule Take 50,000 Units by mouth once a week.   esomeprazole  (NEXIUM ) 40 MG capsule Take 1 capsule (40 mg total) by mouth daily as needed (reflux).   fexofenadine (ALLEGRA) 180 MG tablet Take 180 mg by mouth daily as needed for allergies.   hydroxyurea  (HYDREA ) 500 MG capsule Take 1,500 mg by mouth daily. May take with  food to minimize GI side effects.   hydrOXYzine  (ATARAX ) 25 MG tablet Take 25 mg by mouth See admin instructions. Take 25mg  (1 tablet) by mouth twice daily and an extra tablet if needed.   linezolid  (ZYVOX ) 600 MG tablet Take 1 tablet (600 mg total) by mouth 2 (two) times daily for 14 days. Start on 9/24 after IV antibiotics completed    lubiprostone  (AMITIZA ) 24 MCG capsule Take 24 mcg by mouth in the morning and at bedtime.   ondansetron  (ZOFRAN ) 4 MG tablet Take 4 mg by mouth.   pantoprazole  (PROTONIX ) 40 MG tablet Take 1 tablet (40 mg total) by mouth 2 (two) times daily. (Patient taking differently: Take 40 mg by mouth daily as needed (reflux).)   prazosin  (MINIPRESS ) 1 MG capsule Take 1 mg by mouth at bedtime.   scopolamine (TRANSDERM-SCOP) 1 MG/3DAYS Place 1 patch onto the skin every 3 (three) days.   [DISCONTINUED] Oxycodone  HCl 10 MG TABS Take 1 tablet (10 mg total) by mouth every 6 (six) hours as needed (pain).   [DISCONTINUED] pregabalin  (LYRICA ) 150 MG capsule Take 150 mg by mouth 3 (three) times daily.   Oxycodone  HCl 10 MG TABS Take 1 tablet (10 mg total) by mouth every 6 (six) hours as needed (pain).   pregabalin  (LYRICA ) 150 MG capsule Take 1 capsule (150 mg total) by mouth 3 (three) times daily.   No facility-administered encounter medications on file as of 02/09/2024.    Review of Systems  Review of Systems  Constitutional: Negative.   HENT: Negative.    Cardiovascular: Negative.   Gastrointestinal: Negative.   Allergic/Immunologic: Negative.   Neurological: Negative.   Psychiatric/Behavioral: Negative.       Objective:   BP (!) 100/57 (BP Location: Right Arm, Patient Position: Sitting, Cuff Size: Normal)   Pulse 78   Temp (!) 97.5 F (36.4 C) (Oral)   Ht 5' 4 (1.626 m)   Wt 155 lb (70.3 kg)   LMP  (LMP Unknown)   SpO2 99%   BMI 26.61 kg/m   Wt Readings from Last 5 Encounters:  02/09/24 155 lb (70.3 kg) (84%, Z= 0.99)*  01/15/24 143 lb 4.8 oz (65 kg) (73%, Z= 0.62)*  01/15/24 143 lb 4.8 oz (65 kg) (73%, Z= 0.62)*  01/05/24 145 lb (65.8 kg) (75%, Z= 0.68)*  01/01/24 158 lb 1.1 oz (71.7 kg) (86%, Z= 1.08)*   * Growth percentiles are based on CDC (Girls, 2-20 Years) data.     Physical Exam Vitals and nursing note reviewed.  Constitutional:      General: She is not in acute  distress.    Appearance: She is well-developed.  Cardiovascular:     Rate and Rhythm: Normal rate and regular rhythm.  Pulmonary:     Effort: Pulmonary effort is normal.     Breath sounds: Normal breath sounds.  Neurological:     Mental Status: She is alert and oriented to person, place, and time.       Assessment & Plan:   Sickle cell disease without crisis [facility]) -     Ambulatory referral to Hematology / Oncology -     ToxAssure Flex 15, Ur -     Sickle Cell Panel -     oxyCODONE  HCl; Take 1 tablet (10 mg total) by mouth every 6 (six) hours as needed (pain).  Dispense: 60 tablet; Refill: 0 -     Microalbumin / creatinine urine ratio  Other orders -     Pregabalin ; Take  1 capsule (150 mg total) by mouth 3 (three) times daily.  Dispense: 90 capsule; Refill: 0     Return in about 4 weeks (around 03/08/2024).   Bascom GORMAN Borer, NP 02/09/2024

## 2024-02-10 LAB — CMP14+CBC/D/PLT+FER+RETIC+V...
ALT: 21 IU/L (ref 0–32)
AST: 28 IU/L (ref 0–40)
Albumin: 4.6 g/dL (ref 4.0–5.0)
Alkaline Phosphatase: 201 IU/L — ABNORMAL HIGH (ref 42–106)
BUN/Creatinine Ratio: 12 (ref 9–23)
BUN: 7 mg/dL (ref 6–20)
Basophils Absolute: 0.1 x10E3/uL (ref 0.0–0.2)
Basos: 1 %
Bilirubin Total: 6.2 mg/dL — ABNORMAL HIGH (ref 0.0–1.2)
CO2: 20 mmol/L (ref 20–29)
Calcium: 9.6 mg/dL (ref 8.7–10.2)
Chloride: 102 mmol/L (ref 96–106)
Creatinine, Ser: 0.6 mg/dL (ref 0.57–1.00)
EOS (ABSOLUTE): 0.3 x10E3/uL (ref 0.0–0.4)
Eos: 3 %
Ferritin: 4407 ng/mL — ABNORMAL HIGH (ref 15–77)
Globulin, Total: 3.2 g/dL (ref 1.5–4.5)
Glucose: 86 mg/dL (ref 70–99)
Hematocrit: 25.2 % — ABNORMAL LOW (ref 34.0–46.6)
Hemoglobin: 8.3 g/dL — ABNORMAL LOW (ref 11.1–15.9)
Immature Grans (Abs): 0 x10E3/uL (ref 0.0–0.1)
Immature Granulocytes: 0 %
Lymphocytes Absolute: 2.8 x10E3/uL (ref 0.7–3.1)
Lymphs: 22 %
MCH: 32.3 pg (ref 26.6–33.0)
MCHC: 32.9 g/dL (ref 31.5–35.7)
MCV: 98 fL — ABNORMAL HIGH (ref 79–97)
Monocytes Absolute: 2.1 x10E3/uL — ABNORMAL HIGH (ref 0.1–0.9)
Monocytes: 16 %
NRBC: 2 % — ABNORMAL HIGH (ref 0–0)
Neutrophils Absolute: 7.3 x10E3/uL — ABNORMAL HIGH (ref 1.4–7.0)
Neutrophils: 58 %
Platelets: 359 x10E3/uL (ref 150–450)
Potassium: 4.5 mmol/L (ref 3.5–5.2)
RBC: 2.57 x10E6/uL — CL (ref 3.77–5.28)
RDW: 24 % — ABNORMAL HIGH (ref 11.7–15.4)
Retic Ct Pct: 12.9 % — ABNORMAL HIGH (ref 0.6–2.6)
Sodium: 139 mmol/L (ref 134–144)
Total Protein: 7.8 g/dL (ref 6.0–8.5)
Vit D, 25-Hydroxy: 4.1 ng/mL — ABNORMAL LOW (ref 30.0–100.0)
WBC: 12.7 x10E3/uL — ABNORMAL HIGH (ref 3.4–10.8)
eGFR: 133 mL/min/1.73 (ref >59)

## 2024-02-10 LAB — MICROALBUMIN / CREATININE URINE RATIO
Creatinine, Urine: 80.4 mg/dL
Microalb/Creat Ratio: 15 mg/g{creat} (ref 0–29)
Microalbumin, Urine: 11.8 ug/mL

## 2024-02-12 ENCOUNTER — Ambulatory Visit: Payer: Self-pay | Admitting: Nurse Practitioner

## 2024-02-12 MED ORDER — ERGOCALCIFEROL 1.25 MG (50000 UT) PO CAPS: 50000.0000 [IU] | ORAL_CAPSULE | ORAL | 2 refills | Status: AC

## 2024-02-14 LAB — TOXASSURE FLEX 15, UR
6-ACETYLMORPHINE IA: NEGATIVE ng/mL
7-aminoclonazepam: NOT DETECTED ng/mg{creat}
AMPHETAMINES IA: NEGATIVE ng/mL
Alpha-hydroxyalprazolam: NOT DETECTED ng/mg{creat}
Alpha-hydroxymidazolam: NOT DETECTED ng/mg{creat}
Alpha-hydroxytriazolam: NOT DETECTED ng/mg{creat}
Alprazolam: NOT DETECTED ng/mg{creat}
BARBITURATES IA: NEGATIVE ng/mL
Buprenorphine: NOT DETECTED ng/mg{creat}
CANNABINOIDS IA: NEGATIVE ng/mL
COCAINE METABOLITE IA: NEGATIVE ng/mL
Clonazepam: NOT DETECTED ng/mg{creat}
Creatinine: 84 mg/dL (ref >=20)
Desalkylflurazepam: NOT DETECTED ng/mg{creat}
Desmethyldiazepam: NOT DETECTED ng/mg{creat}
Desmethylflunitrazepam: NOT DETECTED ng/mg{creat}
Diazepam: NOT DETECTED ng/mg{creat}
ETHYL ALCOHOL Enzymatic: NEGATIVE g/dL
Fentanyl: NOT DETECTED ng/mg{creat}
Flunitrazepam: NOT DETECTED ng/mg{creat}
Lorazepam: NOT DETECTED ng/mg{creat}
METHADONE IA: NEGATIVE ng/mL
METHADONE MTB IA: NEGATIVE ng/mL
Midazolam: NOT DETECTED ng/mg{creat}
Norbuprenorphine: NOT DETECTED ng/mg{creat}
Norfentanyl: NOT DETECTED ng/mg{creat}
OPIATE CLASS IA: NEGATIVE ng/mL
OXYCODONE CLASS IA: NEGATIVE ng/mL
Oxazepam: NOT DETECTED ng/mg{creat}
PHENCYCLIDINE IA: NEGATIVE ng/mL
TAPENTADOL, IA: NEGATIVE ng/mL
TRAMADOL IA: NEGATIVE ng/mL
Temazepam: NOT DETECTED ng/mg{creat}

## 2024-02-15 ENCOUNTER — Inpatient Hospital Stay: Admitting: Internal Medicine

## 2024-02-17 ENCOUNTER — Other Ambulatory Visit: Payer: Self-pay

## 2024-02-17 ENCOUNTER — Emergency Department
Admission: EM | Admit: 2024-02-17 | Discharge: 2024-02-17 | Disposition: A | Discharge status: Home / Self Care | Attending: Emergency Medicine | Admitting: Emergency Medicine

## 2024-02-17 DIAGNOSIS — Z8673 Personal history of transient ischemic attack (TIA), and cerebral infarction without residual deficits: Secondary | ICD-10-CM | POA: Diagnosis not present

## 2024-02-17 DIAGNOSIS — G894 Chronic pain syndrome: Secondary | ICD-10-CM | POA: Insufficient documentation

## 2024-02-17 DIAGNOSIS — Z452 Encounter for adjustment and management of vascular access device: Secondary | ICD-10-CM | POA: Insufficient documentation

## 2024-02-17 LAB — COMPREHENSIVE METABOLIC PANEL WITH GFR
ALT: 22 U/L (ref 0–44)
AST: 31 U/L (ref 15–41)
Albumin: 4.4 g/dL (ref 3.5–5.0)
Alkaline Phosphatase: 145 U/L — ABNORMAL HIGH (ref 38–126)
Anion gap: 10 (ref 5–15)
BUN: 8 mg/dL (ref 6–20)
CO2: 23 mmol/L (ref 22–32)
Calcium: 8.9 mg/dL (ref 8.9–10.3)
Chloride: 104 mmol/L (ref 98–111)
Creatinine, Ser: 0.67 mg/dL (ref 0.44–1.00)
GFR, Estimated: >60 mL/min (ref >60)
Glucose, Bld: 90 mg/dL (ref 70–99)
Potassium: 3.6 mmol/L (ref 3.5–5.1)
Sodium: 137 mmol/L (ref 135–145)
Total Bilirubin: 4.7 mg/dL — ABNORMAL HIGH (ref 0.0–1.2)
Total Protein: 8.7 g/dL — ABNORMAL HIGH (ref 6.5–8.1)

## 2024-02-17 LAB — CBC WITH DIFFERENTIAL/PLATELET
Abs Immature Granulocytes: 0.08 K/uL — ABNORMAL HIGH (ref 0.00–0.07)
Basophils Absolute: 0.1 K/uL (ref 0.0–0.1)
Basophils Relative: 1 %
Eosinophils Absolute: 0.2 K/uL (ref 0.0–0.5)
Eosinophils Relative: 1 %
HCT: 23.1 % — ABNORMAL LOW (ref 36.0–46.0)
Hemoglobin: 7.8 g/dL — ABNORMAL LOW (ref 12.0–15.0)
Immature Granulocytes: 1 %
Lymphocytes Relative: 32 %
Lymphs Abs: 4.7 K/uL — ABNORMAL HIGH (ref 0.7–4.0)
MCH: 31.6 pg (ref 26.0–34.0)
MCHC: 33.8 g/dL (ref 30.0–36.0)
MCV: 93.5 fL (ref 80.0–100.0)
Monocytes Absolute: 2.2 K/uL — ABNORMAL HIGH (ref 0.1–1.0)
Monocytes Relative: 15 %
Neutro Abs: 7.4 K/uL (ref 1.7–7.7)
Neutrophils Relative %: 50 %
Platelets: 332 K/uL (ref 150–400)
RBC: 2.47 MIL/uL — ABNORMAL LOW (ref 3.87–5.11)
RDW: 23.8 % — ABNORMAL HIGH (ref 11.5–15.5)
Smear Review: NORMAL
WBC: 14.6 K/uL — ABNORMAL HIGH (ref 4.0–10.5)
nRBC: 1.5 % — ABNORMAL HIGH (ref 0.0–0.2)

## 2024-02-17 LAB — RETICULOCYTES
Immature Retic Fract: 44.7 % — ABNORMAL HIGH (ref 2.3–15.9)
RBC.: 2.46 MIL/uL — ABNORMAL LOW (ref 3.87–5.11)
Retic Count, Absolute: 413 K/uL — ABNORMAL HIGH (ref 19.0–186.0)
Retic Ct Pct: 16.8 % — ABNORMAL HIGH (ref 0.4–3.1)

## 2024-02-17 MED ORDER — DIPHENHYDRAMINE HCL 50 MG/ML IJ SOLN
25.0000 mg | Freq: Once | INTRAMUSCULAR | Status: AC
  Administered 2024-02-17: 25 mg via INTRAVENOUS
  Filled 2024-02-17: qty 1

## 2024-02-17 MED ORDER — KETOROLAC TROMETHAMINE 30 MG/ML IJ SOLN
15.0000 mg | Freq: Once | INTRAMUSCULAR | Status: AC
  Administered 2024-02-17: 15 mg via INTRAVENOUS
  Filled 2024-02-17: qty 1

## 2024-02-17 MED ORDER — ONDANSETRON HCL 4 MG/2ML IJ SOLN
4.0000 mg | Freq: Once | INTRAMUSCULAR | Status: AC
  Administered 2024-02-17: 4 mg via INTRAVENOUS
  Filled 2024-02-17: qty 2

## 2024-02-17 MED ORDER — OXYCODONE HCL 5 MG PO TABS
15.0000 mg | ORAL_TABLET | Freq: Once | ORAL | Status: AC
  Administered 2024-02-17: 15 mg via ORAL
  Filled 2024-02-17: qty 3

## 2024-02-17 MED ORDER — HYDROMORPHONE HCL 1 MG/ML IJ SOLN
2.0000 mg | Freq: Once | INTRAMUSCULAR | Status: AC
  Administered 2024-02-17: 2 mg via INTRAVENOUS
  Filled 2024-02-17: qty 2

## 2024-02-17 NOTE — ED Provider Notes (Signed)
 [facility] Provider Note    Event Date/Time   First MD Initiated Contact with Patient 02/17/24 1836     (approximate)   History   Vascular Access Problem   HPI  Bailey Reese is a 18 y.o. female who presents to the ED for evaluation of Vascular Access Problem   I reviewed 9/12 medical DC summary from [facility].  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  Developed a fever while she was hospitalized, developing signs of sepsis, blood cultures with MSSA.  Started on Ancef  for 2 weeks and to finish with 2 additional weeks of linezolid  until approximately 10/8.  Patient presents because she reports she may have accidentally pulled out her PICC line.  Morbidly got snagged on the couch when she was moving positions.  Secondary to this she reports feeling mild to, chronic generalized pain.  Reports that she was supposed to go out tonight with some friends but she is uncertain if she will be able to go out with them because she is not feeling 100%.  Reports her oxycodone  does not seem like it is working as well as it used to  Physical Exam   Triage Vital Signs: ED Triage Vitals  Encounter Vitals Group     BP 02/17/24 1828 96/76     Girls Systolic BP Percentile --      Girls Diastolic BP Percentile --      Boys Systolic BP Percentile --      Boys Diastolic BP Percentile --      Pulse Rate 02/17/24 1828 97     Resp 02/17/24 1828 17     Temp 02/17/24 1828 98.3 F (36.8 C)     Temp Source 02/17/24 1828 Oral     SpO2 02/17/24 1828 99 %     Weight 02/17/24 1826 155 lb (70.3 kg)     Height 02/17/24 1826 5' 4 (1.626 m)     Head Circumference --      Peak Flow --      Pain Score 02/17/24 1826 8     Pain Loc --      Pain Education --      Exclude from Growth Chart --     Most recent vital signs: Vitals:   02/17/24 1828  BP: 96/76  Pulse: 97  Resp: 17  Temp: 98.3 F (36.8 C)  SpO2: 99%     General: Awake, no distress.  CV:  Good peripheral perfusion.  Resp:  Normal effort.  Abd:  No distention.  MSK:  No deformity noted.  Neuro:  No focal deficits appreciated. Other:  PICC line with an intact but somewhat dirty dressing to the left upper extremity.  Sterilely I gently remove some of this dressing and looks like the PICC line is intact and pulled out perhaps 1 cm, very minimal displacement.  Utilizing a sterile flushing technique it flushes and draws without apparent issue.  ED Results / Procedures / Treatments   Labs (all labs ordered are listed, but only abnormal results are displayed) Labs Reviewed  COMPREHENSIVE METABOLIC PANEL WITH GFR - Abnormal; Notable for the following components:      Result Value   Total Protein 8.7 (*)    Alkaline Phosphatase 145 (*)    Total Bilirubin 4.7 (*)    All other components within normal limits  CBC WITH DIFFERENTIAL/PLATELET - Abnormal; Notable for the following components:   WBC 14.6 (*)  RBC 2.47 (*)    Hemoglobin 7.8 (*)    HCT 23.1 (*)    RDW 23.8 (*)    nRBC 1.5 (*)    Lymphs Abs 4.7 (*)    Monocytes Absolute 2.2 (*)    Abs Immature Granulocytes 0.08 (*)    All other components within normal limits  RETICULOCYTES - Abnormal; Notable for the following components:   Retic Ct Pct 16.8 (*)    RBC. 2.46 (*)    Retic Count, Absolute 413.0 (*)    Immature Retic Fract 44.7 (*)    All other components within normal limits    EKG Sinus rhythm with a rate of 98 bpm.  Normal axis and intervals without clear signs of acute ischemia.  RADIOLOGY   Official radiology report(s): No results found.  PROCEDURES and INTERVENTIONS:  Procedures  Medications  ketorolac  (TORADOL ) 30 MG/ML injection 15 mg (15 mg Intravenous Given 02/17/24 1917)  ondansetron  (ZOFRAN ) injection 4 mg (4 mg Intravenous Given 02/17/24 1914)  diphenhydrAMINE  (BENADRYL ) injection 25 mg (25 mg Intravenous Given 02/17/24 1912)  oxyCODONE  (Oxy  IR/ROXICODONE ) immediate release tablet 15 mg (15 mg Oral Given 02/17/24 1911)     IMPRESSION / MDM / ASSESSMENT AND PLAN / ED COURSE  I reviewed the triage vital signs and the nursing notes.  Differential diagnosis includes, but is not limited to, aplastic crisis, chronic pain syndrome, displaced catheter,  {Patient presents with symptoms of an acute illness or injury that is potentially life-threatening.  Patient presents with concerns for displaced PICC line but it appears to be intact and functioning appropriately.  We will replace the dressing with a fresh and clean 1, provide medications for her chronic pain and plan for outpatient management.  Reassuring blood work with chronic anemia, appropriately elevated reticulocytes without acute metabolic derangements.  Clinical Course as of 02/17/24 1940  Sat Feb 17, 2024  1859 I troubleshoot her PICC line, seem to be functioning normally only pulled out about 1 cm and not significantly dislodged.  I discussed plan of care with the patient [DS]    Clinical Course User Index [DS] Bailey Rover, MD     FINAL CLINICAL IMPRESSION(S) / ED DIAGNOSES   Final diagnoses:  Encounter for assessment of peripherally inserted central catheter (PICC)  Chronic pain syndrome     Rx / DC Orders   ED Discharge Orders     None        Note:  This document was prepared using Dragon voice recognition software and may include unintentional dictation errors.   Bailey Rover, MD 02/17/24 [phone]

## 2024-02-17 NOTE — ED Triage Notes (Addendum)
 Pt arrives via [facility] from home for a PICC line problem and a bad day of sickle cell. Pt diagnosed with MRSA last month and has been doing antibiotics at home. Pts PICC line pulled out some after it got caught on her couch.  EMS vitals: 119/53 BP 88 HR

## 2024-02-17 NOTE — ED Notes (Addendum)
 Pt stated her PICC dressing had not been changed since it was placed. Old dressing brown in color, not clean, and edges are off.

## 2024-02-18 ENCOUNTER — Other Ambulatory Visit: Payer: Self-pay

## 2024-02-18 ENCOUNTER — Encounter: Payer: Self-pay | Admitting: Emergency Medicine

## 2024-02-18 DIAGNOSIS — M545 Low back pain, unspecified: Secondary | ICD-10-CM | POA: Diagnosis present

## 2024-02-18 DIAGNOSIS — D5700 Hb-SS disease with crisis, unspecified: Secondary | ICD-10-CM | POA: Insufficient documentation

## 2024-02-18 NOTE — ED Triage Notes (Signed)
 Pt to ED via [facility] with c/o sickle cell crisis. Pt seen last night in this ED, refused admission last night. Per EMS pt was at church and overdid it. Per EMS pt c/o generalized body pain. Pt visualized in NAD upon arrival to lobby.    Took home oxy at 1830 97.6  130/76 98% RA 98HR

## 2024-02-18 NOTE — ED Notes (Signed)
 Spoke with Dr. Viviann regarding labs. Per Dr. Viviann, no repeat labs at this time.

## 2024-02-19 ENCOUNTER — Encounter (HOSPITAL_COMMUNITY): Payer: Self-pay

## 2024-02-19 ENCOUNTER — Encounter (HOSPITAL_COMMUNITY): Payer: Self-pay | Admitting: Internal Medicine

## 2024-02-19 ENCOUNTER — Inpatient Hospital Stay (HOSPITAL_COMMUNITY)
Admission: AD | Admit: 2024-02-19 | Discharge: 2024-02-29 | DRG: 812 | Disposition: A | Discharge status: Home / Self Care | Source: Other Acute Inpatient Hospital | Attending: Internal Medicine | Admitting: Internal Medicine

## 2024-02-19 ENCOUNTER — Emergency Department
Admission: EM | Admit: 2024-02-19 | Discharge: 2024-02-19 | Disposition: A | Discharge status: Other Acute Inpatient Hospital | Attending: Emergency Medicine | Admitting: Emergency Medicine

## 2024-02-19 DIAGNOSIS — Z91010 Allergy to peanuts: Secondary | ICD-10-CM

## 2024-02-19 DIAGNOSIS — Z833 Family history of diabetes mellitus: Secondary | ICD-10-CM | POA: Diagnosis not present

## 2024-02-19 DIAGNOSIS — F32A Depression, unspecified: Secondary | ICD-10-CM | POA: Diagnosis present

## 2024-02-19 DIAGNOSIS — K5900 Constipation, unspecified: Secondary | ICD-10-CM | POA: Diagnosis present

## 2024-02-19 DIAGNOSIS — D5700 Hb-SS disease with crisis, unspecified: Principal | ICD-10-CM | POA: Diagnosis present

## 2024-02-19 DIAGNOSIS — Z888 Allergy status to other drugs, medicaments and biological substances status: Secondary | ICD-10-CM

## 2024-02-19 DIAGNOSIS — R399 Unspecified symptoms and signs involving the genitourinary system: Secondary | ICD-10-CM | POA: Insufficient documentation

## 2024-02-19 DIAGNOSIS — B9561 Methicillin susceptible Staphylococcus aureus infection as the cause of diseases classified elsewhere: Secondary | ICD-10-CM | POA: Diagnosis present

## 2024-02-19 DIAGNOSIS — K219 Gastro-esophageal reflux disease without esophagitis: Secondary | ICD-10-CM | POA: Diagnosis present

## 2024-02-19 DIAGNOSIS — Z743 Need for continuous supervision: Secondary | ICD-10-CM | POA: Diagnosis not present

## 2024-02-19 DIAGNOSIS — Z7951 Long term (current) use of inhaled steroids: Secondary | ICD-10-CM | POA: Diagnosis not present

## 2024-02-19 DIAGNOSIS — Z8619 Personal history of other infectious and parasitic diseases: Secondary | ICD-10-CM

## 2024-02-19 DIAGNOSIS — Z8673 Personal history of transient ischemic attack (TIA), and cerebral infarction without residual deficits: Secondary | ICD-10-CM | POA: Diagnosis not present

## 2024-02-19 DIAGNOSIS — F4310 Post-traumatic stress disorder, unspecified: Secondary | ICD-10-CM | POA: Diagnosis present

## 2024-02-19 DIAGNOSIS — F411 Generalized anxiety disorder: Secondary | ICD-10-CM | POA: Diagnosis present

## 2024-02-19 DIAGNOSIS — Z91018 Allergy to other foods: Secondary | ICD-10-CM

## 2024-02-19 DIAGNOSIS — Z886 Allergy status to analgesic agent status: Secondary | ICD-10-CM | POA: Diagnosis not present

## 2024-02-19 DIAGNOSIS — Z79899 Other long term (current) drug therapy: Secondary | ICD-10-CM | POA: Diagnosis not present

## 2024-02-19 DIAGNOSIS — G629 Polyneuropathy, unspecified: Secondary | ICD-10-CM | POA: Diagnosis present

## 2024-02-19 DIAGNOSIS — D638 Anemia in other chronic diseases classified elsewhere: Secondary | ICD-10-CM | POA: Diagnosis present

## 2024-02-19 DIAGNOSIS — J4489 Other specified chronic obstructive pulmonary disease: Secondary | ICD-10-CM | POA: Diagnosis present

## 2024-02-19 DIAGNOSIS — Z885 Allergy status to narcotic agent status: Secondary | ICD-10-CM

## 2024-02-19 DIAGNOSIS — D72829 Elevated white blood cell count, unspecified: Secondary | ICD-10-CM | POA: Diagnosis present

## 2024-02-19 DIAGNOSIS — G894 Chronic pain syndrome: Secondary | ICD-10-CM | POA: Diagnosis present

## 2024-02-19 DIAGNOSIS — R7881 Bacteremia: Secondary | ICD-10-CM | POA: Diagnosis present

## 2024-02-19 DIAGNOSIS — Z1152 Encounter for screening for COVID-19: Secondary | ICD-10-CM

## 2024-02-19 DIAGNOSIS — D571 Sickle-cell disease without crisis: Secondary | ICD-10-CM | POA: Diagnosis not present

## 2024-02-19 LAB — RETICULOCYTES
Immature Retic Fract: 32 % — ABNORMAL HIGH (ref 2.3–15.9)
RBC.: 1.9 MIL/uL — ABNORMAL LOW (ref 3.87–5.11)
Retic Count, Absolute: 334 K/uL — ABNORMAL HIGH (ref 19.0–186.0)
Retic Ct Pct: 17.6 % — ABNORMAL HIGH (ref 0.4–3.1)

## 2024-02-19 LAB — CBC WITH DIFFERENTIAL/PLATELET
Abs Immature Granulocytes: 0.25 K/uL — ABNORMAL HIGH (ref 0.00–0.07)
Basophils Absolute: 0.2 K/uL — ABNORMAL HIGH (ref 0.0–0.1)
Basophils Relative: 1 %
Eosinophils Absolute: 0.5 K/uL (ref 0.0–0.5)
Eosinophils Relative: 3 %
HCT: 19.6 % — ABNORMAL LOW (ref 36.0–46.0)
Hemoglobin: 6.5 g/dL — ABNORMAL LOW (ref 12.0–15.0)
Immature Granulocytes: 1 %
Lymphocytes Relative: 32 %
Lymphs Abs: 5.7 K/uL — ABNORMAL HIGH (ref 0.7–4.0)
MCH: 31.1 pg (ref 26.0–34.0)
MCHC: 33.2 g/dL (ref 30.0–36.0)
MCV: 93.8 fL (ref 80.0–100.0)
Monocytes Absolute: 2.1 K/uL — ABNORMAL HIGH (ref 0.1–1.0)
Monocytes Relative: 12 %
Neutro Abs: 9.2 K/uL — ABNORMAL HIGH (ref 1.7–7.7)
Neutrophils Relative %: 51 %
Platelets: 324 K/uL (ref 150–400)
RBC: 2.09 MIL/uL — ABNORMAL LOW (ref 3.87–5.11)
RDW: 23.9 % — ABNORMAL HIGH (ref 11.5–15.5)
Smear Review: NORMAL
WBC: 17.9 K/uL — ABNORMAL HIGH (ref 4.0–10.5)
nRBC: 2.2 % — ABNORMAL HIGH (ref 0.0–0.2)

## 2024-02-19 LAB — BASIC METABOLIC PANEL WITH GFR
Anion gap: 13 (ref 5–15)
BUN: 8 mg/dL (ref 6–20)
CO2: 22 mmol/L (ref 22–32)
Calcium: 8.9 mg/dL (ref 8.9–10.3)
Chloride: 104 mmol/L (ref 98–111)
Creatinine, Ser: 0.51 mg/dL (ref 0.44–1.00)
GFR, Estimated: >60 mL/min (ref >60)
Glucose, Bld: 84 mg/dL (ref 70–99)
Potassium: 3.5 mmol/L (ref 3.5–5.1)
Sodium: 139 mmol/L (ref 135–145)

## 2024-02-19 LAB — CK: Total CK: 94 U/L (ref 38–234)

## 2024-02-19 LAB — PREPARE RBC (CROSSMATCH)

## 2024-02-19 MED ORDER — IPRATROPIUM-ALBUTEROL 0.5-2.5 (3) MG/3ML IN SOLN: 3.0000 mL | Freq: Four times a day (QID) | RESPIRATORY_TRACT | Status: DC | PRN

## 2024-02-19 MED ORDER — HYDROMORPHONE HCL 1 MG/ML IJ SOLN
1.5000 mg | INTRAMUSCULAR | Status: DC | PRN
  Administered 2024-02-19 (×3): 1.5 mg via INTRAVENOUS
  Filled 2024-02-19 (×4): qty 1.5

## 2024-02-19 MED ORDER — SODIUM CHLORIDE 0.45 % IV SOLN: INTRAVENOUS | Status: AC

## 2024-02-19 MED ORDER — HYDROXYZINE HCL 25 MG PO TABS
25.0000 mg | ORAL_TABLET | Freq: Three times a day (TID) | ORAL | Status: DC | PRN
  Administered 2024-02-20 – 2024-02-24 (×4): 25 mg via ORAL
  Filled 2024-02-19 (×5): qty 1

## 2024-02-19 MED ORDER — SODIUM CHLORIDE 0.9% FLUSH: 9.0000 mL | INTRAVENOUS | Status: DC | PRN

## 2024-02-19 MED ORDER — HYDROMORPHONE HCL 1 MG/ML IJ SOLN
1.0000 mg | INTRAMUSCULAR | Status: AC | PRN
  Administered 2024-02-19 (×3): 1 mg via INTRAVENOUS
  Filled 2024-02-19 (×3): qty 1

## 2024-02-19 MED ORDER — ONDANSETRON HCL 4 MG/2ML IJ SOLN
4.0000 mg | INTRAMUSCULAR | Status: DC | PRN
  Administered 2024-02-22 – 2024-02-29 (×11): 4 mg via INTRAVENOUS
  Filled 2024-02-19 (×12): qty 2

## 2024-02-19 MED ORDER — HYDROXYUREA 500 MG PO CAPS
1500.0000 mg | ORAL_CAPSULE | Freq: Every day | ORAL | Status: DC
  Administered 2024-02-20 – 2024-02-29 (×10): 1500 mg via ORAL
  Filled 2024-02-19 (×10): qty 3

## 2024-02-19 MED ORDER — LINEZOLID 600 MG PO TABS
600.0000 mg | ORAL_TABLET | Freq: Two times a day (BID) | ORAL | Status: DC
  Administered 2024-02-19: 600 mg via ORAL
  Filled 2024-02-19 (×2): qty 1

## 2024-02-19 MED ORDER — SENNOSIDES-DOCUSATE SODIUM 8.6-50 MG PO TABS
1.0000 | ORAL_TABLET | Freq: Two times a day (BID) | ORAL | Status: DC
  Administered 2024-02-19 – 2024-02-29 (×19): 1 via ORAL
  Filled 2024-02-19 (×19): qty 1

## 2024-02-19 MED ORDER — CAPSAICIN 0.025 % EX CREA
TOPICAL_CREAM | Freq: Two times a day (BID) | CUTANEOUS | Status: AC
  Filled 2024-02-19: qty 60

## 2024-02-19 MED ORDER — CAPSAICIN 0.075 % EX CREA
TOPICAL_CREAM | Freq: Two times a day (BID) | CUTANEOUS | Status: DC
Start: 2024-02-19 — End: 2024-02-19
  Filled 2024-02-19: qty 57

## 2024-02-19 MED ORDER — ACETAMINOPHEN 500 MG PO TABS
1000.0000 mg | ORAL_TABLET | Freq: Once | ORAL | Status: AC
  Administered 2024-02-19: 1000 mg via ORAL
  Filled 2024-02-19: qty 2

## 2024-02-19 MED ORDER — PREGABALIN 75 MG PO CAPS
150.0000 mg | ORAL_CAPSULE | Freq: Three times a day (TID) | ORAL | Status: DC
  Administered 2024-02-19 – 2024-02-29 (×30): 150 mg via ORAL
  Filled 2024-02-19 (×30): qty 2

## 2024-02-19 MED ORDER — HYDROMORPHONE HCL 1 MG/ML IJ SOLN
1.5000 mg | Freq: Once | INTRAMUSCULAR | Status: AC
  Administered 2024-02-19: 1.5 mg via INTRAVENOUS
  Filled 2024-02-19: qty 1.5

## 2024-02-19 MED ORDER — SODIUM CHLORIDE 0.9 % IV BOLUS
1000.0000 mL | Freq: Once | INTRAVENOUS | Status: AC
  Administered 2024-02-19: 1000 mL via INTRAVENOUS

## 2024-02-19 MED ORDER — FLUTICASONE FUROATE-VILANTEROL 100-25 MCG/ACT IN AEPB
1.0000 | INHALATION_SPRAY | Freq: Every day | RESPIRATORY_TRACT | Status: DC
  Administered 2024-02-20 – 2024-02-29 (×10): 1 via RESPIRATORY_TRACT
  Filled 2024-02-19: qty 28

## 2024-02-19 MED ORDER — LUBIPROSTONE 24 MCG PO CAPS
24.0000 ug | ORAL_CAPSULE | Freq: Every day | ORAL | Status: DC
  Administered 2024-02-20 – 2024-02-29 (×10): 24 ug via ORAL
  Filled 2024-02-19 (×10): qty 1

## 2024-02-19 MED ORDER — NALOXONE HCL 0.4 MG/ML IJ SOLN: 0.4000 mg | INTRAMUSCULAR | Status: DC | PRN

## 2024-02-19 MED ORDER — HYDROMORPHONE 1 MG/ML IV SOLN
INTRAVENOUS | Status: DC
  Administered 2024-02-19: 8.5 mg via INTRAVENOUS
  Administered 2024-02-19 – 2024-02-20 (×2): 30 mg via INTRAVENOUS
  Administered 2024-02-20: 4 mg via INTRAVENOUS
  Administered 2024-02-20: 5.5 mg via INTRAVENOUS
  Administered 2024-02-20: 3.5 mg via INTRAVENOUS
  Administered 2024-02-21: 6.5 mg via INTRAVENOUS
  Administered 2024-02-21: 2 mg via INTRAVENOUS
  Administered 2024-02-21: 8 mg via INTRAVENOUS
  Administered 2024-02-21: 11.5 mg via INTRAVENOUS
  Administered 2024-02-21: 1 mg via INTRAVENOUS
  Administered 2024-02-21 – 2024-02-22 (×2): 30 mg via INTRAVENOUS
  Administered 2024-02-22: 7.5 mg via INTRAVENOUS
  Administered 2024-02-22: 3 mg via INTRAVENOUS
  Administered 2024-02-22: 6.5 mg via INTRAVENOUS
  Administered 2024-02-22: 6 mg via INTRAVENOUS
  Administered 2024-02-22: 6.5 mg via INTRAVENOUS
  Administered 2024-02-23: 2.5 mg via INTRAVENOUS
  Administered 2024-02-23: 30 mg via INTRAVENOUS
  Administered 2024-02-23 (×3): 7 mg via INTRAVENOUS
  Administered 2024-02-23 (×2): 5.5 mg via INTRAVENOUS
  Administered 2024-02-24: 7 mg via INTRAVENOUS
  Administered 2024-02-24: 3.5 mg via INTRAVENOUS
  Administered 2024-02-24: 30 mg via INTRAVENOUS
  Administered 2024-02-24: 1.5 mg via INTRAVENOUS
  Filled 2024-02-19 (×6): qty 30

## 2024-02-19 MED ORDER — KETOROLAC TROMETHAMINE 15 MG/ML IJ SOLN
15.0000 mg | Freq: Once | INTRAMUSCULAR | Status: AC
  Administered 2024-02-19: 15 mg via INTRAVENOUS
  Filled 2024-02-19: qty 1

## 2024-02-19 MED ORDER — ONDANSETRON HCL 4 MG/2ML IJ SOLN
4.0000 mg | Freq: Once | INTRAMUSCULAR | Status: AC
  Administered 2024-02-19: 4 mg via INTRAVENOUS
  Filled 2024-02-19: qty 2

## 2024-02-19 MED ORDER — POLYETHYLENE GLYCOL 3350 17 G PO PACK: 17.0000 g | PACK | Freq: Every day | ORAL | Status: DC | PRN

## 2024-02-19 MED ORDER — SODIUM CHLORIDE 0.9 % IV SOLN: Freq: Once | INTRAVENOUS | Status: AC

## 2024-02-19 MED ORDER — KETOROLAC TROMETHAMINE 15 MG/ML IJ SOLN
15.0000 mg | Freq: Three times a day (TID) | INTRAMUSCULAR | Status: AC
  Administered 2024-02-19 – 2024-02-24 (×15): 15 mg via INTRAVENOUS
  Filled 2024-02-19 (×15): qty 1

## 2024-02-19 MED ORDER — LORATADINE 10 MG PO TABS
10.0000 mg | ORAL_TABLET | Freq: Once | ORAL | Status: AC
  Administered 2024-02-19: 10 mg via ORAL
  Filled 2024-02-19: qty 1

## 2024-02-19 MED ORDER — PANTOPRAZOLE SODIUM 40 MG PO TBEC
40.0000 mg | DELAYED_RELEASE_TABLET | Freq: Two times a day (BID) | ORAL | Status: DC
  Administered 2024-02-19 – 2024-02-29 (×20): 40 mg via ORAL
  Filled 2024-02-19 (×20): qty 1

## 2024-02-19 MED ORDER — ONDANSETRON HCL 4 MG PO TABS: 4.0000 mg | ORAL_TABLET | ORAL | Status: DC | PRN

## 2024-02-19 MED ORDER — LINEZOLID 600 MG PO TABS
600.0000 mg | ORAL_TABLET | Freq: Two times a day (BID) | ORAL | Status: AC
  Administered 2024-02-19 – 2024-02-21 (×5): 600 mg via ORAL
  Filled 2024-02-19 (×5): qty 1

## 2024-02-19 MED ORDER — DIPHENHYDRAMINE HCL 50 MG/ML IJ SOLN
50.0000 mg | Freq: Once | INTRAMUSCULAR | Status: AC
  Administered 2024-02-19: 50 mg via INTRAVENOUS
  Filled 2024-02-19: qty 1

## 2024-02-19 NOTE — ED Provider Notes (Signed)
-----------------------------------------   10:12 AM on 02/19/2024 ----------------------------------------- Fully oriented.  Patient reports that pain medications tend to make her itch.  Will give dose of Claritin .  Patient agreeable.  She is currently about two thirds the way through her blood transfusion but is doing well without any notable side effects or concerns.  Patient understanding agreeable with plan to transfer to [facility] Darra Dicky Oneil, MD 02/19/24 1012

## 2024-02-19 NOTE — H&P (Signed)
 History and Physical    Patient: Bailey Reese FMW:[id number] DOB: 07-31-2005 DOA: 02/19/2024 DOS: the patient was seen and examined on 02/19/2024 PCP: Morgan Slater Pizza, MD   Patient coming from: Home  Chief Complaint: No chief complaint on file.  HPI: Bailey Reese is a 18 y.o. female with medical history significant of depression/anxiety, sickle cell anemia, gastroesophageal reflux disease, prior history of hemorrhagic stroke and recent MSSA bacteremia (actively receiving treatment with linezolid ); who presented to the hospital secondary to acute pain crisis.  Workup demonstrated hemoglobin down to 6.5 with an acute transfusion provided prior to her transfer to [facility] (1 unit PRBC given).  Patient reports generalized pain characteristic of her pain crisis.  Despite using home analgesic therapy she expressed inability to manage discomfort.  No fever, no chills, no shortness of breath, no urinary changes, no diarrhea, no melena, no hematochezia, no hematuria, no abdominal pain, no nausea, no vomiting or sick contacts.  TRH contacted to place patient in the hospital for further evaluation and management of her pain crisis. Patient's care to be assumed by sickle cell team in am (02/20/24)  Review of Systems: As mentioned in the history of present illness. All other systems reviewed and are negative. Past Medical History:  Diagnosis Date   Anxiety    Depression    PTSD (post-traumatic stress disorder)    Sickle cell anemia (HCC)    Past Surgical History:  Procedure Laterality Date   BIOPSY  07/04/2023   Procedure: BIOPSY;  Surgeon: Saintclair Jasper, MD;  Location: [facility];  Service: Gastroenterology;;   CHOLECYSTECTOMY     ESOPHAGOGASTRODUODENOSCOPY (EGD) WITH PROPOFOL  N/A 07/04/2023   Procedure: ESOPHAGOGASTRODUODENOSCOPY (EGD) WITH PROPOFOL ;  Surgeon: Saintclair Jasper, MD;  Location: [facility];  Service: Gastroenterology;  Laterality: N/A;   WRIST SURGERY     Social History:   reports that she has never smoked. She has never used smokeless tobacco. She reports that she does not drink alcohol  and does not use drugs.  Allergies  Allergen Reactions   Cherry Anaphylaxis, Itching and Swelling   Olanzapine  Other (See Comments)    Drug-induced liver injury   Other Swelling and Other (See Comments)    Pitted fruits   Peanut-Containing Drug Products Anaphylaxis, Itching and Swelling   Plum Pulp Anaphylaxis and Hives   Droperidol  Other (See Comments)    Pt states muscle tension, involuntary movements, eye drift, anxiety   Morphine  And Codeine Hives and Itching   Peach [Prunus Persica] Itching, Swelling and Other (See Comments)   Acetaminophen  Nausea And Vomiting   Compazine  [Prochlorperazine ] Other (See Comments) and Hypertension    Patient stated that this medication causes her HR to increase and well as BP. She also states that it cause hallucinations.   Ibuprofen  Nausea And Vomiting    Family History  Problem Relation Age of Onset   Diabetes Paternal Aunt     Prior to Admission medications   Medication Sig Start Date End Date Taking? Authorizing Provider  budesonide-formoterol  (SYMBICORT) 80-4.5 MCG/ACT inhaler Inhale 2 puffs into the lungs in the morning and at bedtime. 12/29/22 02/09/24  [provider]  buprenorphine  (BUTRANS ) 20 MCG/HR PTWK Place 1 patch onto the skin once a week.    [provider]  Calcium Carb-Cholecalciferol (CALCIUM 600+D3 PO) Take 2 tablets by mouth daily.    [provider]  Deferasirox  360 MG TABS Take 360 mg by mouth 2 (two) times daily.    [provider]  ergocalciferol (VITAMIN D2) 1.25  MG (50000 UT) capsule Take 1 capsule (50,000 Units total) by mouth once a week. 02/12/24   Oley Bascom RAMAN, NP  esomeprazole  (NEXIUM ) 40 MG capsule Take 1 capsule (40 mg total) by mouth daily as needed (reflux). 08/29/23   Ijaola, Onyeje M, NP  fexofenadine (ALLEGRA) 180 MG tablet Take 180 mg by mouth daily as  needed for allergies. 12/29/22   [provider]  hydroxyurea  (HYDREA ) 500 MG capsule Take 1,500 mg by mouth daily. May take with food to minimize GI side effects.    [provider]  hydrOXYzine  (ATARAX ) 25 MG tablet Take 25 mg by mouth See admin instructions. Take 25mg  (1 tablet) by mouth twice daily and an extra tablet if needed. Patient not taking: Reported on 02/19/2024    [provider]  linezolid  (ZYVOX ) 600 MG tablet Take 1 tablet (600 mg total) by mouth 2 (two) times daily for 14 days. Start on 9/24 after IV antibiotics completed 02/07/24 02/21/24  Overton Faith T, MD  lubiprostone  (AMITIZA ) 24 MCG capsule Take 24 mcg by mouth in the morning and at bedtime. 11/12/23   [provider]  ondansetron  (ZOFRAN ) 4 MG tablet Take 4 mg by mouth. Patient not taking: Reported on 02/19/2024 12/29/23   [provider]  Oxycodone  HCl 10 MG TABS Take 1 tablet (10 mg total) by mouth every 6 (six) hours as needed (pain). 02/09/24   Oley Bascom RAMAN, NP  pantoprazole  (PROTONIX ) 40 MG tablet Take 1 tablet (40 mg total) by mouth 2 (two) times daily. Patient taking differently: Take 40 mg by mouth daily as needed (reflux). 07/05/23 07/04/24  Sim Emery CROME, MD  prazosin  (MINIPRESS ) 1 MG capsule Take 1 mg by mouth at bedtime. Patient not taking: Reported on 02/19/2024    [provider]  pregabalin  (LYRICA ) 150 MG capsule Take 1 capsule (150 mg total) by mouth 3 (three) times daily. 02/09/24   Nichols, Tonya S, NP  scopolamine (TRANSDERM-SCOP) 1 MG/3DAYS Place 1 patch onto the skin every 3 (three) days. 01/03/24   [provider]    Physical Exam: Vitals:   02/19/24 1714 02/19/24 1826 02/19/24 2022  Resp:  18   Weight: 70.4 kg  70.4 kg  Height: 5' 4 (1.626 m)  5' 4 (1.626 m)   General exam: Alert, awake, oriented x 3; complaining of severe generalized pain.  In no major distress. Respiratory system: Good saturation on room air; no using accessory  muscles.  No wheezing or crackles. Cardiovascular system:RRR. No rubs or gallops; no JVD. Gastrointestinal system: Abdomen is nondistended, soft and nontender. No organomegaly or masses felt. Normal bowel sounds heard. Central nervous system: No focal neurological deficits. Extremities: No cyanosis or clubbing. Skin: No petechiae. Psychiatry: Judgement and insight appear normal. Mood & affect appropriate.   Data Reviewed: Basic metabolic panel: Sodium 139, potassium 3.5, chloride 104, bicarb 22, BUN 8, creatinine 0.51, GFR >60 CBC with differential: WBCs 17.9, hemoglobin 6.5, MCV 93.8, platelet count 324K, absolute immature granulocyte 0.25 CK: 94 Reticulocytes: Reticulocyte count percentage 17.6, RBC 1.90, absolute reticulocyte count 334 and immature reticulocyte fraction of 32.0.  Assessment and Plan: 1-sickle cell anemia/sickle cell crisis -1 unit PRBCs provided at [facility] prior to transfer - No signs of acute bleeding - Will follow hemoglobin trend. - Fluid resuscitation per sickle cell admission protocol will be provided - Analgesic therapy using schedule Toradol , Lyrica  and Dilaudid  PCA pump initiated - Follow clinical response and resume the use of Hydrea . - Sickle cell  service will assume patient's care on 02/20/2019 and provide further management.  2-gastroesophageal flux disease -continue PPI.  3-history of MSSA bacteremia - Continue management with Zyvox  - Plan of care is to finish antibiotics on 02/21/2024  4-chronic pain syndrome/neuropathy - Continue current analgesic therapy - Continue treatment with Lyrica   5-history of asthma/COPD -resume home Breo Ellipta  - As needed DuoNeb has also been ordered - No acute exacerbation currently appreciated.  6-constipation/bowel regimen -continue amitiza , Senokot and MiraLAX  - Maintain adequate hydration.  7-anxiety - Continue use of Atarax .   Advance Care Planning:   Code Status: Full Code     Consults: none   Family Communication: No family at bedside.  Severity of Illness: The appropriate patient status for this patient is INPATIENT. Inpatient status is judged to be reasonable and necessary in order to provide the required intensity of service to ensure the patient's safety. The patient's presenting symptoms, physical exam findings, and initial radiographic and laboratory data in the context of their chronic comorbidities is felt to place them at high risk for further clinical deterioration. Furthermore, it is not anticipated that the patient will be medically stable for discharge from the hospital within 2 midnights of admission.   * I certify that at the point of admission it is my clinical judgment that the patient will require inpatient hospital care spanning beyond 2 midnights from the point of admission due to high intensity of service, high risk for further deterioration and high frequency of surveillance required.*  Author: Eric Nunnery, MD 02/19/2024 9:17 PM  For on call review [email or web address].

## 2024-02-19 NOTE — ED Notes (Signed)
 MD made aware that pt reports previous reaction to blood transfusion 5 years ago. Per MD, good to proceed with transfusion.

## 2024-02-19 NOTE — ED Notes (Signed)
 EMTALA reviewed by this RN.

## 2024-02-19 NOTE — ED Provider Notes (Signed)
 [facility] Provider Note    Event Date/Time   First MD Initiated Contact with Patient 02/19/24 0016     (approximate)   History   Sickle Cell Pain Crisis   HPI  Bailey Reese is a 18 y.o. female   Past medical history of sickle cell disease, with PICC line in place for treatment of MSSA bacteremia, recently transition to oral antibiotics, here with sickle cell pain.  Typical pain crisis for her involving the upper and lower back, pelvis area, chest.  Denies respiratory infectious symptoms like cough or fever.  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  No direct trauma.   Independent Historian contributed to assessment above: Mother at bedside corroborates information above  External Medical Documents Reviewed: Prior hospital notes      Physical Exam   Triage Vital Signs: ED Triage Vitals  Encounter Vitals Group     BP 02/18/24 2128 114/67     Girls Systolic BP Percentile --      Girls Diastolic BP Percentile --      Boys Systolic BP Percentile --      Boys Diastolic BP Percentile --      Pulse Rate 02/18/24 2128 90     Resp 02/18/24 2128 18     Temp 02/18/24 2128 98.4 F (36.9 C)     Temp Source 02/18/24 2128 Oral     SpO2 02/18/24 2128 95 %     Weight 02/18/24 2125 155 lb (70.3 kg)     Height 02/18/24 2125 5' 4 (1.626 m)     Head Circumference --      Peak Flow --      Pain Score --      Pain Loc --      Pain Education --      Exclude from Growth Chart --     Most recent vital signs: Vitals:   02/19/24 0132 02/19/24 0205  BP: 116/83   Pulse: 90   Resp: 16   Temp: 97.6 F (36.4 C)   SpO2: 98% 97%    General: Awake, no distress.  CV:  Good peripheral perfusion.  Resp:  Normal effort.  Abd:  No distention.  Other:  Awake alert comfortable appearing nontoxic normal vital signs and afebrile.  Neck supple full range of motion.   Soft benign abdominal exam clear lungs and normal heart sounds.  PICC line site appears normal and noninfected.   ED Results / Procedures / Treatments   Labs (all labs ordered are listed, but only abnormal results are displayed) Labs Reviewed  CBC WITH DIFFERENTIAL/PLATELET - Abnormal; Notable for the following components:      Result Value   WBC 17.9 (*)    RBC 2.09 (*)    Hemoglobin 6.5 (*)    HCT 19.6 (*)    RDW 23.9 (*)    nRBC 2.2 (*)    Neutro Abs 9.2 (*)    Lymphs Abs 5.7 (*)    Monocytes Absolute 2.1 (*)    Basophils Absolute 0.2 (*)    Abs Immature Granulocytes 0.25 (*)    All other components within normal limits  RETICULOCYTES - Abnormal; Notable for the following components:   Retic Ct Pct 17.6 (*)    RBC. 1.90 (*)    Retic Count, Absolute 334.0 (*)    Immature Retic Fract 32.0 (*)    All other  components within normal limits  BASIC METABOLIC PANEL WITH GFR  CK  PREPARE RBC (CROSSMATCH)  TYPE AND SCREEN     I ordered and reviewed the above labs they are notable for white blood cell count elevated 17, hemoglobin 6.5.    PROCEDURES:  Critical Care performed: Yes, see critical care procedure note(s)  .Critical Care  Performed by: Cyrena Mylar, MD Authorized by: Cyrena Mylar, MD   Critical care provider statement:    Critical care time (minutes):  30   Critical care was time spent personally by me on the following activities:  Development of treatment plan with patient or surrogate, discussions with consultants, evaluation of patient's response to treatment, examination of patient, ordering and review of laboratory studies, ordering and review of radiographic studies, ordering and performing treatments and interventions, pulse oximetry, re-evaluation of patient's condition and review of old charts    MEDICATIONS ORDERED IN ED: Medications  HYDROmorphone  (DILAUDID ) injection 1.5 mg (has no administration in time range)  capsicum (ZOSTRIX) 0.075 % cream  (has no administration in time range)  sodium chloride  0.9 % bolus 1,000 mL (0 mLs Intravenous Stopped 02/19/24 0417)  ketorolac  (TORADOL ) 15 MG/ML injection 15 mg (15 mg Intravenous Given 02/19/24 0101)  acetaminophen  (TYLENOL ) tablet 1,000 mg (1,000 mg Oral Given 02/19/24 0101)  HYDROmorphone  (DILAUDID ) injection 1 mg (1 mg Intravenous Given 02/19/24 0219)  diphenhydrAMINE  (BENADRYL ) injection 50 mg (50 mg Intravenous Given 02/19/24 0218)  ondansetron  (ZOFRAN ) injection 4 mg (4 mg Intravenous Given 02/19/24 0219)  HYDROmorphone  (DILAUDID ) injection 1.5 mg (1.5 mg Intravenous Given 02/19/24 0324)  0.9 %  sodium chloride  infusion ( Intravenous New Bag/Given 02/19/24 0420)    External physician / consultants:  I spoke with admitting hospitalist at [facility] regarding care plan for this patient.   IMPRESSION / MDM / ASSESSMENT AND PLAN / ED COURSE  I reviewed the triage vital signs and the nursing notes.                                Patient's presentation is most consistent with acute presentation with potential threat to life or bodily function.  Differential diagnosis includes, but is not limited to, sickle cell pain crisis, anemia, considered but less likely acute chest, infection   The patient is on the cardiac monitor to evaluate for evidence of arrhythmia and/or significant heart rate changes.  MDM:    Acute pain crisis with anemia down to the sixes, administer blood transfusion.  Low clinical suspicion for acute chest.  MSSA bacteremia being treated, PICC line in place that appears noninfected, does not appear septic at this time.  Refractory to initial pain medications in ED will transfer to [facility] for ongoing treatment.      FINAL CLINICAL IMPRESSION(S) / ED DIAGNOSES   Final diagnoses:  Sickle cell pain crisis (HCC)  Anemia, sickle cell with crisis (HCC)     Rx / DC Orders   ED Discharge Orders     None        Note:  This document was prepared using  Dragon voice recognition software and may include unintentional dictation errors.    Cyrena Mylar, MD 02/19/24 [phone]

## 2024-02-19 NOTE — ED Provider Notes (Signed)
 Vitals:   02/19/24 1246 02/19/24 1442  BP:  126/81  Pulse: 77 83  Resp:  16  Temp:  98 F (36.7 C)  SpO2: 99% 94%     ----------------------------------------- 2:44 PM on 02/19/2024 -----------------------------------------   Patient currently alert oriented understanding agreeable with plan to transfer.  She is in no distress, just finishing utilizing restroom.  Appears stable for transfer   Dicky Anes, MD 02/19/24 1444

## 2024-02-19 NOTE — Progress Notes (Signed)
 Admissions notified of patients arrival at 1554. Patient in bed, no acute distress, complaining of 10/10 pain. Awaiting MD to be assigned to notify.

## 2024-02-20 DIAGNOSIS — R399 Unspecified symptoms and signs involving the genitourinary system: Secondary | ICD-10-CM | POA: Insufficient documentation

## 2024-02-20 LAB — TYPE AND SCREEN
ABO/RH(D): B POS
Antibody Screen: NEGATIVE
Unit division: 0

## 2024-02-20 LAB — BPAM RBC
Blood Product Expiration Date: 202510232359
ISSUE DATE / TIME: 202510060748
Unit Type and Rh: 5100

## 2024-02-20 MED ORDER — SODIUM CHLORIDE 0.9% FLUSH: 10.0000 mL | INTRAVENOUS | Status: DC | PRN

## 2024-02-20 MED ORDER — CHLORHEXIDINE GLUCONATE CLOTH 2 % EX PADS
6.0000 | MEDICATED_PAD | Freq: Every day | CUTANEOUS | Status: DC
  Administered 2024-02-21 – 2024-02-29 (×7): 6 via TOPICAL

## 2024-02-20 MED ORDER — SODIUM CHLORIDE 0.9% FLUSH
10.0000 mL | Freq: Two times a day (BID) | INTRAVENOUS | Status: DC
  Administered 2024-02-21 (×2): 10 mL
  Administered 2024-02-21: 20 mL
  Administered 2024-02-22 – 2024-02-29 (×11): 10 mL

## 2024-02-20 NOTE — Progress Notes (Signed)
 Patient ID: Bailey Reese, female   DOB: [birth year], 18 y.o.   MRN: [id number] Subjective: Bailey Reese is a 18 y.o. female, with medical history significant of sickle cell disease, chronic pain, history of hemorrhagic stroke in 2017, asthma, PTSD, depression, anxiety, GERD presenting from [facility] ED due to concern for sickle cell pain crisis.   Patient is endorsing pain of 8/10. Slight burning and discharge with urination .    Objective:  Vital signs in last 24 hours:  Vitals:   02/19/24 2257 02/20/24 0501 02/20/24 0804 02/20/24 0938  BP:    108/72  Pulse:    81  Resp: 17 13 13 20   SpO2: 98% 98%  95%  Weight:      Height:        Intake/Output from previous day:   Intake/Output Summary (Last 24 hours) at 02/20/2024 1030 Last data filed at 02/20/2024 0346 Gross per 24 hour  Intake 727.86 ml  Output --  Net 727.86 ml    Physical Exam: General: Alert, awake, oriented x3, in no acute distress.  HEENT: [place]/AT [place]ER[place], EOMI Neck: Trachea midline,  no masses, no thyromegal,y no JVD, no carotid bruit OROPHARYNX:  Moist, No exudate/ erythema/lesions.  Heart: Regular rate and rhythm, without murmurs, rubs, gallops, PMI non-displaced, no heaves or thrills on palpation.  Lungs: Clear to auscultation, no wheezing or rhonchi noted. No increased vocal fremitus resonant to percussion  Abdomen: Soft, nontender, nondistended, positive bowel sounds, no masses no hepatosplenomegaly noted..  Neuro: No focal neurological deficits noted cranial nerves II through XII grossly intact. DTRs 2+ bilaterally upper and lower extremities. Strength 5 out of 5 in bilateral upper and lower extremities. Musculoskeletal: No warm swelling or erythema around joints, no spinal tenderness noted. Psychiatric: Patient alert and oriented x3, good insight and cognition, good recent to remote recall. Lymph node survey: No cervical axillary or inguinal lymphadenopathy noted.  Lab Results:  Basic Metabolic Panel:     Component Value Date/Time   NA 139 02/19/2024 0100   NA 139 02/09/2024 1433   NA 135 05/01/2013 1440   K 3.5 02/19/2024 0100   K 4.3 05/01/2013 1440   CL 104 02/19/2024 0100   CL 102 05/01/2013 1440   CO2 22 02/19/2024 0100   CO2 27 (H) 05/01/2013 1440   BUN 8 02/19/2024 0100   BUN 7 02/09/2024 1433   BUN 13 05/01/2013 1440   CREATININE 0.51 02/19/2024 0100   CREATININE 0.40 (L) 05/01/2013 1440   GLUCOSE 84 02/19/2024 0100   GLUCOSE 108 (H) 05/01/2013 1440   CALCIUM 8.9 02/19/2024 0100   CALCIUM 9.8 05/01/2013 1440   CBC:    Component Value Date/Time   WBC 17.9 (H) 02/19/2024 0100   HGB 6.5 (L) 02/19/2024 0100   HGB 8.3 (L) 02/09/2024 1433   HCT 19.6 (L) 02/19/2024 0100   HCT 25.2 (L) 02/09/2024 1433   PLT 324 02/19/2024 0100   PLT 359 02/09/2024 1433   MCV 93.8 02/19/2024 0100   MCV 98 (H) 02/09/2024 1433   MCV 89 05/01/2013 1440   NEUTROABS 9.2 (H) 02/19/2024 0100   NEUTROABS 7.3 (H) 02/09/2024 1433   NEUTROABS 7.9 05/30/2012 1958   LYMPHSABS 5.7 (H) 02/19/2024 0100   LYMPHSABS 2.8 02/09/2024 1433   LYMPHSABS 5.0 05/30/2012 1958   MONOABS 2.1 (H) 02/19/2024 0100   MONOABS 1.6 (H) 05/30/2012 1958   EOSABS 0.5 02/19/2024 0100   EOSABS 0.3 02/09/2024 1433   EOSABS 0.6 05/30/2012 1958   BASOSABS 0.2 (H)  02/19/2024 0100   BASOSABS 0.1 02/09/2024 1433   BASOSABS 3 04/28/2013 1736   BASOSABS 0.2 (H) 05/30/2012 1958    No results found for this or any previous visit (from the past 240 hours).  Studies/Results: No results found.  Medications: Scheduled Meds:  fluticasone  furoate-vilanterol  1 puff Inhalation Daily   HYDROmorphone    Intravenous Q4H   hydroxyurea   1,500 mg Oral Daily   ketorolac   15 mg Intravenous Q8H   linezolid   600 mg Oral BID   lubiprostone   24 mcg Oral Q breakfast   pantoprazole   40 mg Oral BID   pregabalin   150 mg Oral TID   senna-docusate  1 tablet Oral BID   Continuous Infusions:  sodium chloride  50 mL/hr at 02/19/24 1725   PRN  Meds:.hydrOXYzine , ipratropium-albuterol , naloxone  **AND** sodium chloride  flush, ondansetron  **OR** ondansetron  (ZOFRAN ) IV, polyethylene glycol  Consultants: None  Procedures: None  Antibiotics: None  Assessment/Plan: Principal Problem:   Sickle cell crisis (HCC) Active Problems:   Chronic pain syndrome   Leukocytosis   GAD (generalized anxiety disorder)   Anemia of chronic disease   UTI symptoms   Hb Sickle Cell Disease with Pain crisis: ongoing pain, Continue IVF @ KVO, continue weight based Dilaudid  PCA at current dose, IV Toradol  15 mg Q 6 H for a total of 5 days, continue oral home pain medications as ordered. Monitor vitals very closely, Re-evaluate pain scale regularly, 2 L of Oxygen by [place]. Patient encouraged to ambulate on the hallway today.  UTI Symptoms: urinalysis pending.  Leukocytosis: slightly elevated, patient has no s/s of infection, will continue to monitor without antibiotics. Anemia of Chronic Disease: hgb below baseline, will monitor closely and transfuse appropriately. Chronic pain Syndrome: Continue oral home pain medication GAD ( generalized anxiety disorder): stable, continue medication/follow up as scheduled.   Code Status: Full Code Family Communication: N/A Disposition Plan: Not yet ready for discharge  [name] Cover NP  If 7PM-7AM, please contact night-coverage.  02/20/2024, 10:30 AM  LOS: 1 day

## 2024-02-20 NOTE — Plan of Care (Signed)
  Problem: Education: Goal: Knowledge of General Education information will improve Description: Including pain rating scale, medication(s)/side effects and non-pharmacologic comfort measures Outcome: Progressing   Problem: Health Behavior/Discharge Planning: Goal: Ability to manage health-related needs will improve Outcome: Progressing   Problem: Clinical Measurements: Goal: Ability to maintain clinical measurements within normal limits will improve Outcome: Progressing Goal: Will remain free from infection Outcome: Progressing Goal: Diagnostic test results will improve Outcome: Progressing Goal: Respiratory complications will improve Outcome: Progressing Goal: Cardiovascular complication will be avoided Outcome: Progressing   Problem: Activity: Goal: Risk for activity intolerance will decrease Outcome: Progressing   Problem: Nutrition: Goal: Adequate nutrition will be maintained Outcome: Progressing   Problem: Coping: Goal: Level of anxiety will decrease Outcome: Progressing   Problem: Elimination: Goal: Will not experience complications related to bowel motility Outcome: Progressing Goal: Will not experience complications related to urinary retention Outcome: Progressing   Problem: Pain Managment: Goal: General experience of comfort will improve and/or be controlled Outcome: Progressing   Problem: Safety: Goal: Ability to remain free from injury will improve Outcome: Progressing   Problem: Education: Goal: Knowledge of vaso-occlusive preventative measures will improve Outcome: Progressing Goal: Awareness of infection prevention will improve Outcome: Progressing Goal: Awareness of signs and symptoms of anemia will improve Outcome: Progressing Goal: Long-term complications will improve Outcome: Progressing   Problem: Self-Care: Goal: Ability to incorporate actions that prevent/reduce pain crisis will improve Outcome: Progressing   Problem:  Bowel/Gastric: Goal: Gut motility will be maintained Outcome: Progressing   Problem: Tissue Perfusion: Goal: Complications related to inadequate tissue perfusion will be avoided or minimized Outcome: Progressing   Problem: Respiratory: Goal: Pulmonary complications will be avoided or minimized Outcome: Progressing Goal: Acute Chest Syndrome will be identified early to prevent complications Outcome: Progressing   Problem: Sensory: Goal: Pain level will decrease with appropriate interventions Outcome: Progressing   Problem: Health Behavior: Goal: Postive changes in compliance with treatment and prescription regimens will improve Outcome: Progressing

## 2024-02-20 NOTE — Progress Notes (Signed)
   02/20/24 0941  TOC Brief Assessment  Insurance and Status Reviewed  Patient has primary care physician Yes  Home environment has been reviewed home w/ family  Prior level of function: independent  Prior/Current Home Services No current home services  Social Drivers of Health Review SDOH reviewed no interventions necessary  Readmission risk has been reviewed Yes  Transition of care needs no transition of care needs at this time

## 2024-02-20 NOTE — Plan of Care (Signed)
  Problem: Education: Goal: Knowledge of General Education information will improve Description: Including pain rating scale, medication(s)/side effects and non-pharmacologic comfort measures Outcome: Progressing   Problem: Activity: Goal: Risk for activity intolerance will decrease Outcome: Progressing   Problem: Nutrition: Goal: Adequate nutrition will be maintained Outcome: Progressing   Problem: Elimination: Goal: Will not experience complications related to bowel motility Outcome: Progressing   Problem: Pain Managment: Goal: General experience of comfort will improve and/or be controlled Outcome: Progressing   Problem: Safety: Goal: Ability to remain free from injury will improve Outcome: Progressing

## 2024-02-21 LAB — URINALYSIS, COMPLETE (UACMP) WITH MICROSCOPIC
Bilirubin Urine: NEGATIVE
Glucose, UA: NEGATIVE mg/dL
Hgb urine dipstick: NEGATIVE
Ketones, ur: NEGATIVE mg/dL
Leukocytes,Ua: NEGATIVE
Nitrite: NEGATIVE
Protein, ur: NEGATIVE mg/dL
Specific Gravity, Urine: 1.01 (ref 1.005–1.030)
pH: 6 (ref 5.0–8.0)

## 2024-02-21 LAB — CBC
HCT: 25 % — ABNORMAL LOW (ref 36.0–46.0)
Hemoglobin: 8.5 g/dL — ABNORMAL LOW (ref 12.0–15.0)
MCH: 31.6 pg (ref 26.0–34.0)
MCHC: 34 g/dL (ref 30.0–36.0)
MCV: 92.9 fL (ref 80.0–100.0)
Platelets: 397 K/uL (ref 150–400)
RBC: 2.69 MIL/uL — ABNORMAL LOW (ref 3.87–5.11)
RDW: 23.2 % — ABNORMAL HIGH (ref 11.5–15.5)
WBC: 10.5 K/uL (ref 4.0–10.5)
nRBC: 3.6 % — ABNORMAL HIGH (ref 0.0–0.2)

## 2024-02-21 NOTE — Plan of Care (Signed)

## 2024-02-21 NOTE — Progress Notes (Cosign Needed Addendum)
 Patient ID: Bailey Reese, female   DOB: [birth year], 18 y.o.   MRN: [id number] Subjective: Bailey Reese is a 18 y.o. female, with medical history significant of sickle cell disease, chronic pain, history of hemorrhagic stroke in 2017, asthma, PTSD, depression, anxiety, GERD presenting from [facility] ED due to concern for sickle cell pain crisis.   Patient continues to endorse pain of 8/10 with no changes from yesterday.  Slight burning and discharge with urination, denies subjective fever, Nausea, vomiting , diarrhea.    Objective:  Vital signs in last 24 hours:  Vitals:   02/21/24 0400 02/21/24 0646 02/21/24 0827 02/21/24 0843  BP:  129/85    Pulse:  94    Resp: 16 16  14   Temp:  98.5 F (36.9 C)    TempSrc:  Oral    SpO2: 94% 91% 93%   Weight:      Height:        Intake/Output from previous day:   Intake/Output Summary (Last 24 hours) at 02/21/2024 0908 Last data filed at 02/21/2024 0020 Gross per 24 hour  Intake 617.97 ml  Output 2000 ml  Net -1382.03 ml    Physical Exam: General: Alert, awake, oriented x3, in no acute distress.  HEENT: [place]/AT PEERL, EOMI Neck: Trachea midline,  no masses, no thyromegal,y no JVD, no carotid bruit OROPHARYNX:  Moist, No exudate/ erythema/lesions.  Heart: Regular rate and rhythm, without murmurs, rubs, gallops, PMI non-displaced, no heaves or thrills on palpation.  Lungs: Clear to auscultation, no wheezing or rhonchi noted. No increased vocal fremitus resonant to percussion  Abdomen: Soft, nontender, nondistended, positive bowel sounds, no masses no hepatosplenomegaly noted..  Neuro: No focal neurological deficits noted cranial nerves II through XII grossly intact. DTRs 2+ bilaterally upper and lower extremities. Strength 5 out of 5 in bilateral upper and lower extremities. Musculoskeletal: Generalize body tenderness Psychiatric: Patient alert and oriented x3, good insight and cognition, good recent to remote recall. Lymph node survey:  No cervical axillary or inguinal lymphadenopathy noted.  Lab Results:  Basic Metabolic Panel:    Component Value Date/Time   NA 139 02/19/2024 0100   NA 139 02/09/2024 1433   NA 135 05/01/2013 1440   K 3.5 02/19/2024 0100   K 4.3 05/01/2013 1440   CL 104 02/19/2024 0100   CL 102 05/01/2013 1440   CO2 22 02/19/2024 0100   CO2 27 (H) 05/01/2013 1440   BUN 8 02/19/2024 0100   BUN 7 02/09/2024 1433   BUN 13 05/01/2013 1440   CREATININE 0.51 02/19/2024 0100   CREATININE 0.40 (L) 05/01/2013 1440   GLUCOSE 84 02/19/2024 0100   GLUCOSE 108 (H) 05/01/2013 1440   CALCIUM 8.9 02/19/2024 0100   CALCIUM 9.8 05/01/2013 1440   CBC:    Component Value Date/Time   WBC 10.5 02/21/2024 0416   HGB 8.5 (L) 02/21/2024 0416   HGB 8.3 (L) 02/09/2024 1433   HCT 25.0 (L) 02/21/2024 0416   HCT 25.2 (L) 02/09/2024 1433   PLT 397 02/21/2024 0416   PLT 359 02/09/2024 1433   MCV 92.9 02/21/2024 0416   MCV 98 (H) 02/09/2024 1433   MCV 89 05/01/2013 1440   NEUTROABS 9.2 (H) 02/19/2024 0100   NEUTROABS 7.3 (H) 02/09/2024 1433   NEUTROABS 7.9 05/30/2012 1958   LYMPHSABS 5.7 (H) 02/19/2024 0100   LYMPHSABS 2.8 02/09/2024 1433   LYMPHSABS 5.0 05/30/2012 1958   MONOABS 2.1 (H) 02/19/2024 0100   MONOABS 1.6 (H) 05/30/2012 1958   EOSABS  0.5 02/19/2024 0100   EOSABS 0.3 02/09/2024 1433   EOSABS 0.6 05/30/2012 1958   BASOSABS 0.2 (H) 02/19/2024 0100   BASOSABS 0.1 02/09/2024 1433   BASOSABS 3 04/28/2013 1736   BASOSABS 0.2 (H) 05/30/2012 1958    No results found for this or any previous visit (from the past 240 hours).  Studies/Results: No results found.  Medications: Scheduled Meds:  Chlorhexidine  Gluconate Cloth  6 each Topical Daily   fluticasone  furoate-vilanterol  1 puff Inhalation Daily   HYDROmorphone    Intravenous Q4H   hydroxyurea   1,500 mg Oral Daily   ketorolac   15 mg Intravenous Q8H   linezolid   600 mg Oral BID   lubiprostone   24 mcg Oral Q breakfast   pantoprazole   40 mg Oral  BID   pregabalin   150 mg Oral TID   senna-docusate  1 tablet Oral BID   sodium chloride  flush  10-40 mL Intracatheter Q12H   Continuous Infusions:   PRN Meds:.hydrOXYzine , ipratropium-albuterol , naloxone  **AND** sodium chloride  flush, ondansetron  **OR** ondansetron  (ZOFRAN ) IV, polyethylene glycol, sodium chloride  flush  Consultants: None  Procedures: None  Antibiotics: None  Assessment/Plan: Principal Problem:   Sickle cell crisis (HCC) Active Problems:   Chronic pain syndrome   Leukocytosis   GAD (generalized anxiety disorder)   Anemia of chronic disease   UTI symptoms   Hb Sickle Cell Disease with Pain crisis: pain is not improving, Continue IVF @ KVO, continue Dilaudid  PCA at current dose, IV Toradol  15 mg Q 6 H for a total of 5 days, continue oral home pain medications as ordered. Monitor vitals very closely, Re-evaluate pain scale regularly, 2 L of Oxygen by . Patient encouraged to ambulate on the hallway today.  UTI Symptoms: urinalysis pending. Patient is unable to give sample, will try again today.  Leukocytosis: Resolved Anemia of Chronic Disease: hgb below baseline, will monitor closely and transfuse appropriately. Chronic pain Syndrome: Continue oral home pain medication GAD ( generalized anxiety disorder): stable, continue medication/follow up as scheduled.   Code Status: Full Code Family Communication: N/A Disposition Plan: Not yet ready for discharge  [name] Cover NP  If 7PM-7AM, please contact night-coverage.  02/21/2024, 9:08 AM  LOS: 2 days

## 2024-02-21 NOTE — Plan of Care (Signed)
  Problem: Education: Goal: Knowledge of General Education information will improve Description: Including pain rating scale, medication(s)/side effects and non-pharmacologic comfort measures Outcome: Progressing   Problem: Health Behavior/Discharge Planning: Goal: Ability to manage health-related needs will improve Outcome: Progressing   Problem: Clinical Measurements: Goal: Ability to maintain clinical measurements within normal limits will improve Outcome: Progressing Goal: Will remain free from infection Outcome: Progressing Goal: Diagnostic test results will improve Outcome: Progressing Goal: Respiratory complications will improve Outcome: Progressing Goal: Cardiovascular complication will be avoided Outcome: Progressing   Problem: Nutrition: Goal: Adequate nutrition will be maintained Outcome: Progressing   Problem: Coping: Goal: Level of anxiety will decrease Outcome: Progressing   Problem: Elimination: Goal: Will not experience complications related to bowel motility Outcome: Progressing   

## 2024-02-22 LAB — CBC
HCT: 25.6 % — ABNORMAL LOW (ref 36.0–46.0)
Hemoglobin: 8.2 g/dL — ABNORMAL LOW (ref 12.0–15.0)
MCH: 30 pg (ref 26.0–34.0)
MCHC: 32 g/dL (ref 30.0–36.0)
MCV: 93.8 fL (ref 80.0–100.0)
Platelets: 409 K/uL — ABNORMAL HIGH (ref 150–400)
RBC: 2.73 MIL/uL — ABNORMAL LOW (ref 3.87–5.11)
RDW: 22.9 % — ABNORMAL HIGH (ref 11.5–15.5)
WBC: 8.5 K/uL (ref 4.0–10.5)
nRBC: 2.4 % — ABNORMAL HIGH (ref 0.0–0.2)

## 2024-02-22 NOTE — Progress Notes (Signed)
 Patient ID: Bailey Reese, female   DOB: 2006-04-07, 18 y.o.   MRN: [id number] Subjective: Bailey Reese is a 18 y.o. female, with medical history significant of sickle cell disease, chronic pain, history of hemorrhagic stroke in 2017, asthma, PTSD, depression, anxiety, GERD presenting from [facility] ED due to concern for sickle cell pain crisis.   Patient says her pain is better today and rates it 6-7/10 with no changes from yesterday. Urinalysis negative for UTI. denies subjective fever, Nausea, vomiting , diarrhea.    Objective:  Vital signs in last 24 hours:  Vitals:   02/22/24 0357 02/22/24 0753 02/22/24 0812 02/22/24 1056  BP:    125/74  Pulse:    98  Resp: 17 14  20   Temp:    97.9 F (36.6 C)  TempSrc:    Oral  SpO2: 97%  95% 94%  Weight:      Height:        Intake/Output from previous day:   Intake/Output Summary (Last 24 hours) at 02/22/2024 1141 Last data filed at 02/22/2024 1100 Gross per 24 hour  Intake 604 ml  Output 3000 ml  Net -2396 ml    Physical Exam: General: Alert, awake, oriented x3, in no acute distress.  HEENT: [place]/AT [place]ER[place], EOMI Neck: Trachea midline,  no masses, no thyromegal,y no JVD, no carotid bruit OROPHARYNX:  Moist, No exudate/ erythema/lesions.  Heart: Regular rate and rhythm, without murmurs, rubs, gallops, PMI non-displaced, no heaves or thrills on palpation.  Lungs: Clear to auscultation, no wheezing or rhonchi noted. No increased vocal fremitus resonant to percussion  Abdomen: Soft, nontender, nondistended, positive bowel sounds, no masses no hepatosplenomegaly noted..  Neuro: No focal neurological deficits noted cranial nerves II through XII grossly intact. DTRs 2+ bilaterally upper and lower extremities. Strength 5 out of 5 in bilateral upper and lower extremities. Musculoskeletal: Generalize body tenderness Psychiatric: Patient alert and oriented x3, good insight and cognition, good recent to remote recall. Lymph node survey: No  cervical axillary or inguinal lymphadenopathy noted.  Lab Results:  Basic Metabolic Panel:    Component Value Date/Time   NA 139 02/19/2024 0100   NA 139 02/09/2024 1433   NA 135 05/01/2013 1440   K 3.5 02/19/2024 0100   K 4.3 05/01/2013 1440   CL 104 02/19/2024 0100   CL 102 05/01/2013 1440   CO2 22 02/19/2024 0100   CO2 27 (H) 05/01/2013 1440   BUN 8 02/19/2024 0100   BUN 7 02/09/2024 1433   BUN 13 05/01/2013 1440   CREATININE 0.51 02/19/2024 0100   CREATININE 0.40 (L) 05/01/2013 1440   GLUCOSE 84 02/19/2024 0100   GLUCOSE 108 (H) 05/01/2013 1440   CALCIUM 8.9 02/19/2024 0100   CALCIUM 9.8 05/01/2013 1440   CBC:    Component Value Date/Time   WBC 8.5 02/22/2024 0544   HGB 8.2 (L) 02/22/2024 0544   HGB 8.3 (L) 02/09/2024 1433   HCT 25.6 (L) 02/22/2024 0544   HCT 25.2 (L) 02/09/2024 1433   PLT 409 (H) 02/22/2024 0544   PLT 359 02/09/2024 1433   MCV 93.8 02/22/2024 0544   MCV 98 (H) 02/09/2024 1433   MCV 89 05/01/2013 1440   NEUTROABS 9.2 (H) 02/19/2024 0100   NEUTROABS 7.3 (H) 02/09/2024 1433   NEUTROABS 7.9 05/30/2012 1958   LYMPHSABS 5.7 (H) 02/19/2024 0100   LYMPHSABS 2.8 02/09/2024 1433   LYMPHSABS 5.0 05/30/2012 1958   MONOABS 2.1 (H) 02/19/2024 0100   MONOABS 1.6 (H) 05/30/2012 1958  EOSABS 0.5 02/19/2024 0100   EOSABS 0.3 02/09/2024 1433   EOSABS 0.6 05/30/2012 1958   BASOSABS 0.2 (H) 02/19/2024 0100   BASOSABS 0.1 02/09/2024 1433   BASOSABS 3 04/28/2013 1736   BASOSABS 0.2 (H) 05/30/2012 1958    No results found for this or any previous visit (from the past 240 hours).  Studies/Results: No results found.  Medications: Scheduled Meds:  Chlorhexidine  Gluconate Cloth  6 each Topical Daily   fluticasone  furoate-vilanterol  1 puff Inhalation Daily   HYDROmorphone    Intravenous Q4H   hydroxyurea   1,500 mg Oral Daily   ketorolac   15 mg Intravenous Q8H   lubiprostone   24 mcg Oral Q breakfast   pantoprazole   40 mg Oral BID   pregabalin   150 mg  Oral TID   senna-docusate  1 tablet Oral BID   sodium chloride  flush  10-40 mL Intracatheter Q12H   Continuous Infusions:   PRN Meds:.hydrOXYzine , ipratropium-albuterol , naloxone  **AND** sodium chloride  flush, ondansetron  **OR** ondansetron  (ZOFRAN ) IV, polyethylene glycol, sodium chloride  flush  Consultants: None  Procedures: None  Antibiotics: None  Assessment/Plan: Principal Problem:   Sickle cell crisis (HCC) Active Problems:   Chronic pain syndrome   Leukocytosis   GAD (generalized anxiety disorder)   Anemia of chronic disease   UTI symptoms   Hb Sickle Cell Disease with Pain crisis: pain is not improving, Continue IVF @ KVO, continue Dilaudid  PCA at current dose, IV Toradol  15 mg Q 6 H for a total of 5 days, continue oral home pain medications as ordered. Monitor vitals very closely, Re-evaluate pain scale regularly, 2 L of Oxygen by [place]. Patient encouraged to ambulate on the hallway today.  UTI Symptoms: Urinalysis negative for UTI Leukocytosis: Resolved Anemia of Chronic Disease: hgb below baseline, will monitor closely and transfuse appropriately. Chronic pain Syndrome: Continue oral home pain medication GAD ( generalized anxiety disorder): stable, continue medication/follow up as scheduled.   Code Status: Full Code Family Communication: N/A Disposition Plan: Not yet ready for discharge  [name] Cover NP  If 7PM-7AM, please contact night-coverage.  02/22/2024, 11:41 AM  LOS: 3 days

## 2024-02-22 NOTE — Plan of Care (Signed)
  Problem: Elimination: Goal: Will not experience complications related to bowel motility Outcome: Progressing Goal: Will not experience complications related to urinary retention Outcome: Progressing   Problem: Bowel/Gastric: Goal: Gut motility will be maintained Outcome: Progressing   Problem: Respiratory: Goal: Pulmonary complications will be avoided or minimized Outcome: Progressing   Problem: Sensory: Goal: Pain level will decrease with appropriate interventions Outcome: Progressing

## 2024-02-23 ENCOUNTER — Encounter: Payer: Self-pay | Admitting: Nurse Practitioner

## 2024-02-23 LAB — CBC
HCT: 24.1 % — ABNORMAL LOW (ref 36.0–46.0)
Hemoglobin: 7.9 g/dL — ABNORMAL LOW (ref 12.0–15.0)
MCH: 30.6 pg (ref 26.0–34.0)
MCHC: 32.8 g/dL (ref 30.0–36.0)
MCV: 93.4 fL (ref 80.0–100.0)
Platelets: 452 K/uL — ABNORMAL HIGH (ref 150–400)
RBC: 2.58 MIL/uL — ABNORMAL LOW (ref 3.87–5.11)
RDW: 22 % — ABNORMAL HIGH (ref 11.5–15.5)
WBC: 9.8 K/uL (ref 4.0–10.5)
nRBC: 1.3 % — ABNORMAL HIGH (ref 0.0–0.2)

## 2024-02-23 NOTE — Plan of Care (Signed)
  Problem: Clinical Measurements: Goal: Ability to maintain clinical measurements within normal limits will improve Outcome: Progressing   Problem: Pain Managment: Goal: General experience of comfort will improve and/or be controlled Outcome: Progressing   Problem: Safety: Goal: Ability to remain free from injury will improve Outcome: Progressing   Problem: Bowel/Gastric: Goal: Gut motility will be maintained Outcome: Progressing   Problem: Respiratory: Goal: Pulmonary complications will be avoided or minimized Outcome: Progressing   Problem: Sensory: Goal: Pain level will decrease with appropriate interventions Outcome: Progressing   Problem: Health Behavior: Goal: Postive changes in compliance with treatment and prescription regimens will improve Outcome: Progressing

## 2024-02-23 NOTE — Progress Notes (Signed)
 Patient ID: Bailey Reese, female   DOB: 03-17-2006, 18 y.o.   MRN: [id number] Subjective: Bailey Reese is a 18 y.o. female, with medical history significant of sickle cell disease, chronic pain, history of hemorrhagic stroke in 2017, asthma, PTSD, depression, anxiety, GERD presenting from [facility] ED due to concern for sickle cell pain crisis.   Patient rates pain at 7/10 with no changes from yesterday. Denies fever, Nausea, vomiting, diarrhea.   Objective:  Vital signs in last 24 hours:  Vitals:   02/23/24 0639 02/23/24 0821 02/23/24 0910 02/23/24 0943  BP: 109/69   118/78  Pulse: 98   92  Resp: 18 15  20   Temp: 98.2 F (36.8 C)   97.7 F (36.5 C)  TempSrc: Oral   Oral  SpO2: 97%  96% 97%  Weight:      Height:        Intake/Output from previous day:   Intake/Output Summary (Last 24 hours) at 02/23/2024 1205 Last data filed at 02/23/2024 0030 Gross per 24 hour  Intake 334.5 ml  Output --  Net 334.5 ml    Physical Exam: General: Alert, awake, oriented x3, in no acute distress.  HEENT: [place]/AT [place]ER[place], EOMI Neck: Trachea midline,  no masses, no thyromegal,y no JVD, no carotid bruit OROPHARYNX:  Moist, No exudate/ erythema/lesions.  Heart: Regular rate and rhythm, without murmurs, rubs, gallops, PMI non-displaced, no heaves or thrills on palpation.  Lungs: Clear to auscultation, no wheezing or rhonchi noted. No increased vocal fremitus resonant to percussion  Abdomen: Soft, nontender, nondistended, positive bowel sounds, no masses no hepatosplenomegaly noted..  Neuro: No focal neurological deficits noted cranial nerves II through XII grossly intact. DTRs 2+ bilaterally upper and lower extremities. Strength 5 out of 5 in bilateral upper and lower extremities. Musculoskeletal: Generalize body tenderness Psychiatric: Patient alert and oriented x3, good insight and cognition, good recent to remote recall. Lymph node survey: No cervical axillary or inguinal lymphadenopathy  noted.  Lab Results:  Basic Metabolic Panel:    Component Value Date/Time   NA 139 02/19/2024 0100   NA 139 02/09/2024 1433   NA 135 05/01/2013 1440   K 3.5 02/19/2024 0100   K 4.3 05/01/2013 1440   CL 104 02/19/2024 0100   CL 102 05/01/2013 1440   CO2 22 02/19/2024 0100   CO2 27 (H) 05/01/2013 1440   BUN 8 02/19/2024 0100   BUN 7 02/09/2024 1433   BUN 13 05/01/2013 1440   CREATININE 0.51 02/19/2024 0100   CREATININE 0.40 (L) 05/01/2013 1440   GLUCOSE 84 02/19/2024 0100   GLUCOSE 108 (H) 05/01/2013 1440   CALCIUM 8.9 02/19/2024 0100   CALCIUM 9.8 05/01/2013 1440   CBC:    Component Value Date/Time   WBC 9.8 02/23/2024 0944   HGB 7.9 (L) 02/23/2024 0944   HGB 8.3 (L) 02/09/2024 1433   HCT 24.1 (L) 02/23/2024 0944   HCT 25.2 (L) 02/09/2024 1433   PLT 452 (H) 02/23/2024 0944   PLT 359 02/09/2024 1433   MCV 93.4 02/23/2024 0944   MCV 98 (H) 02/09/2024 1433   MCV 89 05/01/2013 1440   NEUTROABS 9.2 (H) 02/19/2024 0100   NEUTROABS 7.3 (H) 02/09/2024 1433   NEUTROABS 7.9 05/30/2012 1958   LYMPHSABS 5.7 (H) 02/19/2024 0100   LYMPHSABS 2.8 02/09/2024 1433   LYMPHSABS 5.0 05/30/2012 1958   MONOABS 2.1 (H) 02/19/2024 0100   MONOABS 1.6 (H) 05/30/2012 1958   EOSABS 0.5 02/19/2024 0100   EOSABS 0.3 02/09/2024 1433  EOSABS 0.6 05/30/2012 1958   BASOSABS 0.2 (H) 02/19/2024 0100   BASOSABS 0.1 02/09/2024 1433   BASOSABS 3 04/28/2013 1736   BASOSABS 0.2 (H) 05/30/2012 1958    No results found for this or any previous visit (from the past 240 hours).  Studies/Results: No results found.  Medications: Scheduled Meds:  Chlorhexidine  Gluconate Cloth  6 each Topical Daily   fluticasone  furoate-vilanterol  1 puff Inhalation Daily   HYDROmorphone    Intravenous Q4H   hydroxyurea   1,500 mg Oral Daily   ketorolac   15 mg Intravenous Q8H   lubiprostone   24 mcg Oral Q breakfast   pantoprazole   40 mg Oral BID   pregabalin   150 mg Oral TID   senna-docusate  1 tablet Oral BID    sodium chloride  flush  10-40 mL Intracatheter Q12H   Continuous Infusions:   PRN Meds:.hydrOXYzine , ipratropium-albuterol , naloxone  **AND** sodium chloride  flush, ondansetron  **OR** ondansetron  (ZOFRAN ) IV, polyethylene glycol, sodium chloride  flush  Consultants: None  Procedures: None  Antibiotics: None  Assessment/Plan: Principal Problem:   Sickle cell crisis (HCC) Active Problems:   Chronic pain syndrome   Leukocytosis   GAD (generalized anxiety disorder)   Anemia of chronic disease   UTI symptoms   Hb Sickle Cell Disease with Pain crisis: pain is not improving, Continue IVF @ K[facility], continue Dilaudid  PCA at current dose, IV Toradol  15 mg Q 6 H for a total of 5 days, continue oral home pain medications as ordered. Monitor vitals very closely, Re-evaluate pain scale regularly, 2 L of Oxygen by [place]. Patient encouraged to ambulate on the hallway today.  UTI Symptoms: Urinalysis negative for UTI Leukocytosis: Resolved Anemia of Chronic Disease: hgb below baseline, no need for transfusion at this time. Will monitor closely and transfuse appropriately. Chronic pain Syndrome: Continue oral home pain medication GAD ( generalized anxiety disorder): stable, continue medication/follow up as scheduled.   Code Status: Full Code Family Communication: N/A Disposition Plan: Not yet ready for discharge  [name] Cover NP  If 7PM-7AM, please contact night-coverage.  02/23/2024, 12:05 PM  LOS: 4 days

## 2024-02-24 DIAGNOSIS — D5700 Hb-SS disease with crisis, unspecified: Secondary | ICD-10-CM | POA: Diagnosis not present

## 2024-02-24 LAB — TYPE AND SCREEN
ABO/RH(D): B POS
Antibody Screen: NEGATIVE
Unit division: 0

## 2024-02-24 LAB — BPAM RBC
Blood Product Expiration Date: 202510222359
Unit Type and Rh: 5100

## 2024-02-24 MED ORDER — HYDROMORPHONE 1 MG/ML IV SOLN
INTRAVENOUS | Status: DC
  Administered 2024-02-24: 30 mg via INTRAVENOUS
  Administered 2024-02-24 (×2): 5 mg via INTRAVENOUS
  Administered 2024-02-24: 7 mg via INTRAVENOUS
  Administered 2024-02-24: 4 mg via INTRAVENOUS
  Administered 2024-02-25: 7.5 mg via INTRAVENOUS
  Administered 2024-02-25: 3 mg via INTRAVENOUS
  Administered 2024-02-25 – 2024-02-26 (×2): 30 mg via INTRAVENOUS
  Administered 2024-02-26: 8.79 mg via INTRAVENOUS
  Administered 2024-02-26: 5.5 mg via INTRAVENOUS
  Administered 2024-02-26: 3 mg via INTRAVENOUS
  Administered 2024-02-27: 8.5 mg via INTRAVENOUS
  Administered 2024-02-27: 30 mg via INTRAVENOUS
  Administered 2024-02-27 (×2): 6 mg via INTRAVENOUS
  Administered 2024-02-27: 8 mg via INTRAVENOUS
  Administered 2024-02-27: 7 mg via INTRAVENOUS
  Administered 2024-02-27: 8.5 mg via INTRAVENOUS
  Administered 2024-02-28: 30 mg via INTRAVENOUS
  Administered 2024-02-28: 0.5 mg via INTRAVENOUS
  Administered 2024-02-28: 6 mg via INTRAVENOUS
  Administered 2024-02-28: 6.5 mg via INTRAVENOUS
  Administered 2024-02-28: 4 mg via INTRAVENOUS
  Administered 2024-02-28: 10 mg via INTRAVENOUS
  Administered 2024-02-28: 30 mg via INTRAVENOUS
  Administered 2024-02-29: 5.5 mg via INTRAVENOUS
  Administered 2024-02-29: 8.5 mg via INTRAVENOUS
  Administered 2024-02-29: 6 mg via INTRAVENOUS
  Administered 2024-02-29: 5.5 mg via INTRAVENOUS
  Filled 2024-02-24 (×6): qty 30

## 2024-02-24 MED ORDER — CAMPHOR-MENTHOL 0.5-0.5 % EX LOTN
TOPICAL_LOTION | CUTANEOUS | Status: DC | PRN
  Filled 2024-02-24: qty 222

## 2024-02-24 MED ORDER — OXYCODONE HCL 5 MG PO TABS
10.0000 mg | ORAL_TABLET | ORAL | Status: DC | PRN
  Administered 2024-02-24 – 2024-02-29 (×22): 10 mg via ORAL
  Filled 2024-02-24 (×22): qty 2

## 2024-02-24 MED ORDER — HYDROXYZINE HCL 25 MG PO TABS
25.0000 mg | ORAL_TABLET | Freq: Four times a day (QID) | ORAL | Status: DC | PRN
  Administered 2024-02-24 (×2): 25 mg via ORAL
  Filled 2024-02-24 (×3): qty 1

## 2024-02-24 NOTE — Progress Notes (Signed)
 Subjective: Bailey Reese is a 18 year old female with a medical history significant for sickle cell disease, chronic pain syndrome, history of hemorrhagic stroke in 2017, history of asthma, PTSD, depression, anxiety, and GERD was admitted for sickle cell pain crisis. Today, patient rates pain as 8/10 primarily to her low back and lower extremities.  She felt as if pain was improving on last night, however pain returned this a.m. in her lower back.  Pain has not been well-controlled with IV Dilaudid  PCA. She denies any headache, chest pain, shortness of breath, urinary symptoms, nausea, vomiting, or diarrhea.  Objective:  Vital signs in last 24 hours:  Vitals:   02/24/24 0418 02/24/24 0633 02/24/24 0746 02/24/24 1003  BP:  112/68  114/60  Pulse:  84  85  Resp: 16 18 17 14   Temp:  98.3 F (36.8 C)  98.2 F (36.8 C)  TempSrc:  Oral  Oral  SpO2:  100% 100% 96%  Weight:      Height:        Intake/Output from previous day:   Intake/Output Summary (Last 24 hours) at 02/24/2024 1123 Last data filed at 02/23/2024 1600 Gross per 24 hour  Intake 360 ml  Output --  Net 360 ml    Physical Exam: General: Alert, awake, oriented x3, in no acute distress.  HEENT: [place]/AT [place], EOMI Neck: Trachea midline,  no masses, no thyromegal,y no JVD, no carotid bruit OROPHARYNX:  Moist, No exudate/ erythema/lesions.  Heart: Regular rate and rhythm, without murmurs, rubs, gallops, PMI non-displaced, no heaves or thrills on palpation.  Lungs: Clear to auscultation, no wheezing or rhonchi noted. No increased vocal fremitus resonant to percussion  Abdomen: Soft, nontender, nondistended, positive bowel sounds, no masses no hepatosplenomegaly noted..  Neuro: No focal neurological deficits noted cranial nerves II through XII grossly intact. DTRs 2+ bilaterally upper and lower extremities. Strength 5 out of 5 in bilateral upper and lower extremities. Musculoskeletal: No warm swelling or erythema around  joints, no spinal tenderness noted. Psychiatric: Patient alert and oriented x3, good insight and cognition, good recent to remote recall. Lymph node survey: No cervical axillary or inguinal lymphadenopathy noted.  Lab Results:  Basic Metabolic Panel:    Component Value Date/Time   NA 139 02/19/2024 0100   NA 139 02/09/2024 1433   NA 135 05/01/2013 1440   K 3.5 02/19/2024 0100   K 4.3 05/01/2013 1440   CL 104 02/19/2024 0100   CL 102 05/01/2013 1440   CO2 22 02/19/2024 0100   CO2 27 (H) 05/01/2013 1440   BUN 8 02/19/2024 0100   BUN 7 02/09/2024 1433   BUN 13 05/01/2013 1440   CREATININE 0.51 02/19/2024 0100   CREATININE 0.40 (L) 05/01/2013 1440   GLUCOSE 84 02/19/2024 0100   GLUCOSE 108 (H) 05/01/2013 1440   CALCIUM 8.9 02/19/2024 0100   CALCIUM 9.8 05/01/2013 1440   CBC:    Component Value Date/Time   WBC 9.8 02/23/2024 0944   HGB 7.9 (L) 02/23/2024 0944   HGB 8.3 (L) 02/09/2024 1433   HCT 24.1 (L) 02/23/2024 0944   HCT 25.2 (L) 02/09/2024 1433   PLT 452 (H) 02/23/2024 0944   PLT 359 02/09/2024 1433   MCV 93.4 02/23/2024 0944   MCV 98 (H) 02/09/2024 1433   MCV 89 05/01/2013 1440   NEUTROABS 9.2 (H) 02/19/2024 0100   NEUTROABS 7.3 (H) 02/09/2024 1433   NEUTROABS 7.9 05/30/2012 1958   LYMPHSABS 5.7 (H) 02/19/2024 0100   LYMPHSABS 2.8  02/09/2024 1433   LYMPHSABS 5.0 05/30/2012 1958   MONOABS 2.1 (H) 02/19/2024 0100   MONOABS 1.6 (H) 05/30/2012 1958   EOSABS 0.5 02/19/2024 0100   EOSABS 0.3 02/09/2024 1433   EOSABS 0.6 05/30/2012 1958   BASOSABS 0.2 (H) 02/19/2024 0100   BASOSABS 0.1 02/09/2024 1433   BASOSABS 3 04/28/2013 1736   BASOSABS 0.2 (H) 05/30/2012 1958    No results found for this or any previous visit (from the past 240 hours).  Studies/Results: No results found.  Medications: Scheduled Meds:  Chlorhexidine  Gluconate Cloth  6 each Topical Daily   fluticasone  furoate-vilanterol  1 puff Inhalation Daily   HYDROmorphone    Intravenous Q4H    hydroxyurea   1,500 mg Oral Daily   ketorolac   15 mg Intravenous Q8H   lubiprostone   24 mcg Oral Q breakfast   pantoprazole   40 mg Oral BID   pregabalin   150 mg Oral TID   senna-docusate  1 tablet Oral BID   sodium chloride  flush  10-40 mL Intracatheter Q12H   Continuous Infusions: PRN Meds:.camphor-menthol, hydrOXYzine , ipratropium-albuterol , naloxone  **AND** sodium chloride  flush, ondansetron  **OR** ondansetron  (ZOFRAN ) IV, oxyCODONE , polyethylene glycol, sodium chloride  flush  Consultants: none  Procedures: none  Antibiotics: none  Assessment/Plan: Principal Problem:   Sickle cell crisis (HCC) Active Problems:   Chronic pain syndrome   Leukocytosis   GAD (generalized anxiety disorder)   Anemia of chronic disease   UTI symptoms  Sickle cell disease with pain crisis: Continue IV Dilaudid  PCA, decrease settings in preparation for discharge over the next several days. Restart home medication oxycodone  10 mg every 6 hours as needed for severe breakthrough pain Monitor vital signs very closely, reevaluate pain scale regularly, and supplemental oxygen as needed.  UTI symptoms: Resolved.  Urinalysis negative for UTI Leukocytosis: Improving.  Does not warrant antibiotics at this time.  Monitor closely.  Anemia of chronic disease: Hemoglobin is stable.  There is no clinical indication for blood transfusion at this time.  Monitor closely, labs in AM.  Chronic pain syndrome: Continue home pain medications  Generalized anxiety disorder: Stable.  No SI or HI.  Code Status: Full Code Family Communication: N/A Disposition Plan: Not yet ready for discharge.  Discharge planned for 02/25/2024.  Bertram Georgina Bald  DNP, APRN, FNP-C Patient Care [facility] [address] [place], [place] [address] [phone]  If 7PM-7AM, please contact night-coverage.  02/24/2024, 11:23 AM  LOS: 5 days

## 2024-02-24 NOTE — Plan of Care (Signed)

## 2024-02-25 DIAGNOSIS — D5700 Hb-SS disease with crisis, unspecified: Secondary | ICD-10-CM | POA: Diagnosis not present

## 2024-02-25 LAB — CBC
HCT: 21.9 % — ABNORMAL LOW (ref 36.0–46.0)
Hemoglobin: 7.1 g/dL — ABNORMAL LOW (ref 12.0–15.0)
MCH: 30.7 pg (ref 26.0–34.0)
MCHC: 32.4 g/dL (ref 30.0–36.0)
MCV: 94.8 fL (ref 80.0–100.0)
Platelets: 468 K/uL — ABNORMAL HIGH (ref 150–400)
RBC: 2.31 MIL/uL — ABNORMAL LOW (ref 3.87–5.11)
RDW: 22.1 % — ABNORMAL HIGH (ref 11.5–15.5)
WBC: 10.5 K/uL (ref 4.0–10.5)
nRBC: 0.9 % — ABNORMAL HIGH (ref 0.0–0.2)

## 2024-02-25 LAB — COMPREHENSIVE METABOLIC PANEL WITH GFR
ALT: 78 U/L — ABNORMAL HIGH (ref 0–44)
AST: 57 U/L — ABNORMAL HIGH (ref 15–41)
Albumin: 4.1 g/dL (ref 3.5–5.0)
Alkaline Phosphatase: 165 U/L — ABNORMAL HIGH (ref 38–126)
Anion gap: 10 (ref 5–15)
BUN: 13 mg/dL (ref 6–20)
CO2: 25 mmol/L (ref 22–32)
Calcium: 9.7 mg/dL (ref 8.9–10.3)
Chloride: 105 mmol/L (ref 98–111)
Creatinine, Ser: 0.66 mg/dL (ref 0.44–1.00)
GFR, Estimated: >60 mL/min (ref >60)
Glucose, Bld: 90 mg/dL (ref 70–99)
Potassium: 4.3 mmol/L (ref 3.5–5.1)
Sodium: 140 mmol/L (ref 135–145)
Total Bilirubin: 5.1 mg/dL — ABNORMAL HIGH (ref 0.0–1.2)
Total Protein: 7.6 g/dL (ref 6.5–8.1)

## 2024-02-25 LAB — TYPE AND SCREEN
ABO/RH(D): B POS
Antibody Screen: NEGATIVE

## 2024-02-25 LAB — RESP PANEL BY RT-PCR (RSV, FLU A&B, COVID)  RVPGX2
Influenza A by PCR: NEGATIVE
Influenza B by PCR: NEGATIVE
Resp Syncytial Virus by PCR: NEGATIVE
SARS Coronavirus 2 by RT PCR: NEGATIVE

## 2024-02-25 NOTE — Progress Notes (Signed)
 Subjective: Bailey Reese is a 18 year old female with a medical history significant for sickle cell disease, chronic pain syndrome, history of hemorrhagic stroke in 2017, history of asthma, PTSD, depression, anxiety, and GERD was admitted for sickle cell pain crisis. Today, patient rates pain as 7/10 primarily to her low back and lower extremities.  Patient states that pain intensity continues to improve.  However, she states that she is not ready for discharge at this time.  She denies any headache, chest pain, shortness of breath, urinary symptoms, nausea, vomiting, or diarrhea.  Objective:  Vital signs in last 24 hours:  Vitals:   02/25/24 0541 02/25/24 0800 02/25/24 0824 02/25/24 0921  BP: 114/75   112/66  Pulse: 97   90  Resp: 15 13 13 14   Temp: 98.2 F (36.8 C)   97.7 F (36.5 C)  TempSrc: Oral   Oral  SpO2: 98%  98% 95%  Weight:      Height:        Intake/Output from previous day:   Intake/Output Summary (Last 24 hours) at 02/25/2024 1144 Last data filed at 02/25/2024 0538 Gross per 24 hour  Intake 480 ml  Output --  Net 480 ml    Physical Exam: General: Alert, awake, oriented x3, in no acute distress.  HEENT: [place]/AT [place], EOMI Neck: Trachea midline,  no masses, no thyromegal,y no JVD, no carotid bruit OROPHARYNX:  Moist, No exudate/ erythema/lesions.  Heart: Regular rate and rhythm, without murmurs, rubs, gallops, PMI non-displaced, no heaves or thrills on palpation.  Lungs: Clear to auscultation, no wheezing or rhonchi noted. No increased vocal fremitus resonant to percussion  Abdomen: Soft, nontender, nondistended, positive bowel sounds, no masses no hepatosplenomegaly noted..  Neuro: No focal neurological deficits noted cranial nerves II through XII grossly intact. DTRs 2+ bilaterally upper and lower extremities. Strength 5 out of 5 in bilateral upper and lower extremities. Musculoskeletal: No warm swelling or erythema around joints, no spinal tenderness  noted. Psychiatric: Patient alert and oriented x3, good insight and cognition, good recent to remote recall. Lymph node survey: No cervical axillary or inguinal lymphadenopathy noted.  Lab Results:  Basic Metabolic Panel:    Component Value Date/Time   NA 140 02/25/2024 0415   NA 139 02/09/2024 1433   NA 135 05/01/2013 1440   K 4.3 02/25/2024 0415   K 4.3 05/01/2013 1440   CL 105 02/25/2024 0415   CL 102 05/01/2013 1440   CO2 25 02/25/2024 0415   CO2 27 (H) 05/01/2013 1440   BUN 13 02/25/2024 0415   BUN 7 02/09/2024 1433   BUN 13 05/01/2013 1440   CREATININE 0.66 02/25/2024 0415   CREATININE 0.40 (L) 05/01/2013 1440   GLUCOSE 90 02/25/2024 0415   GLUCOSE 108 (H) 05/01/2013 1440   CALCIUM 9.7 02/25/2024 0415   CALCIUM 9.8 05/01/2013 1440   CBC:    Component Value Date/Time   WBC 10.5 02/25/2024 0415   HGB 7.1 (L) 02/25/2024 0415   HGB 8.3 (L) 02/09/2024 1433   HCT 21.9 (L) 02/25/2024 0415   HCT 25.2 (L) 02/09/2024 1433   PLT 468 (H) 02/25/2024 0415   PLT 359 02/09/2024 1433   MCV 94.8 02/25/2024 0415   MCV 98 (H) 02/09/2024 1433   MCV 89 05/01/2013 1440   NEUTROABS 9.2 (H) 02/19/2024 0100   NEUTROABS 7.3 (H) 02/09/2024 1433   NEUTROABS 7.9 05/30/2012 1958   LYMPHSABS 5.7 (H) 02/19/2024 0100   LYMPHSABS 2.8 02/09/2024 1433   LYMPHSABS 5.0  05/30/2012 1958   MONOABS 2.1 (H) 02/19/2024 0100   MONOABS 1.6 (H) 05/30/2012 1958   EOSABS 0.5 02/19/2024 0100   EOSABS 0.3 02/09/2024 1433   EOSABS 0.6 05/30/2012 1958   BASOSABS 0.2 (H) 02/19/2024 0100   BASOSABS 0.1 02/09/2024 1433   BASOSABS 3 04/28/2013 1736   BASOSABS 0.2 (H) 05/30/2012 1958    No results found for this or any previous visit (from the past 240 hours).  Studies/Results: No results found.  Medications: Scheduled Meds:  Chlorhexidine  Gluconate Cloth  6 each Topical Daily   fluticasone  furoate-vilanterol  1 puff Inhalation Daily   HYDROmorphone    Intravenous Q4H   hydroxyurea   1,500 mg Oral Daily    lubiprostone   24 mcg Oral Q breakfast   pantoprazole   40 mg Oral BID   pregabalin   150 mg Oral TID   senna-docusate  1 tablet Oral BID   sodium chloride  flush  10-40 mL Intracatheter Q12H   Continuous Infusions: PRN Meds:.camphor-menthol, hydrOXYzine , ipratropium-albuterol , naloxone  **AND** sodium chloride  flush, ondansetron  **OR** ondansetron  (ZOFRAN ) IV, oxyCODONE , polyethylene glycol, sodium chloride  flush  Consultants: none  Procedures: none  Antibiotics: none  Assessment/Plan: Principal Problem:   Sickle cell crisis (HCC) Active Problems:   Chronic pain syndrome   Leukocytosis   GAD (generalized anxiety disorder)   Anemia of chronic disease   UTI symptoms  Sickle cell disease with pain crisis: Continue IV Dilaudid  PCA, decrease settings in preparation for discharge over the next several days. Restart home medication oxycodone  10 mg every 6 hours as needed for severe breakthrough pain Monitor vital signs very closely, reevaluate pain scale regularly, and supplemental oxygen as needed.  UTI symptoms: Resolved.  Urinalysis negative for UTI Leukocytosis: Improving.  Does not warrant antibiotics at this time.  Monitor closely.  Anemia of chronic disease: Hemoglobin is stable.  There is no clinical indication for blood transfusion at this time.  Monitor closely, labs in AM.  Chronic pain syndrome: Continue home pain medications  Generalized anxiety disorder: Stable.  No SI or HI.  Code Status: Full Code Family Communication: N/A Disposition Plan: Not yet ready for discharge.  Discharge planned for 02/26/2024.  Bertram Georgina Bald  DNP, APRN, FNP-C Patient Care [facility] [address] [place], [place] [address] [phone]  If 7PM-7AM, please contact night-coverage.  02/25/2024, 11:44 AM  LOS: 6 days

## 2024-02-26 NOTE — Progress Notes (Signed)
 Patient ID: Bailey Reese, female   DOB: Nov 13, 2005, 19 y.o.   MRN: [id number] Subjective: Bailey Reese is a 18 y.o. female, with medical history significant of sickle cell disease, chronic pain, history of hemorrhagic stroke in 2017, asthma, PTSD, depression, anxiety, GERD presenting from [facility] ED due to concern for sickle cell pain crisis.   Patient was crying this morning and reports that pain has worsened because she started her period today and rated her pain on 9/10.  It 6-7/10.denies subjective fever, Nausea, vomiting , diarrhea.    Objective:  Vital signs in last 24 hours:  Vitals:   02/26/24 1009 02/26/24 1127 02/26/24 1240 02/26/24 1537  BP: 128/68  115/68   Pulse: 80  99   Resp: 16 13 16 15   Temp: 98.2 F (36.8 C)  99.2 F (37.3 C)   TempSrc: Oral  Oral   SpO2: 100% 98% 100% 98%  Weight:      Height:        Intake/Output from previous day:  No intake or output data in the 24 hours ending 02/26/24 1623   Physical Exam: General: Alert, awake, oriented x3, in no acute distress.  HEENT: [place]/AT PEERL, EOMI Neck: Trachea midline,  no masses, no thyromegal,y no JVD, no carotid bruit OROPHARYNX:  Moist, No exudate/ erythema/lesions.  Heart: Regular rate and rhythm, without murmurs, rubs, gallops, PMI non-displaced, no heaves or thrills on palpation.  Lungs: Clear to auscultation, no wheezing or rhonchi noted. No increased vocal fremitus resonant to percussion  Abdomen: Soft, nontender, nondistended, positive bowel sounds, no masses no hepatosplenomegaly noted..  Neuro: No focal neurological deficits noted cranial nerves II through XII grossly intact. DTRs 2+ bilaterally upper and lower extremities. Strength 5 out of 5 in bilateral upper and lower extremities. Musculoskeletal: Generalize body tenderness, lower back/lower abdomen tenderness. Psychiatric: Patient alert and oriented x3, good insight and cognition, good recent to remote recall. Lymph node survey: No  cervical axillary or inguinal lymphadenopathy noted.  Lab Results:  Basic Metabolic Panel:    Component Value Date/Time   NA 140 02/25/2024 0415   NA 139 02/09/2024 1433   NA 135 05/01/2013 1440   K 4.3 02/25/2024 0415   K 4.3 05/01/2013 1440   CL 105 02/25/2024 0415   CL 102 05/01/2013 1440   CO2 25 02/25/2024 0415   CO2 27 (H) 05/01/2013 1440   BUN 13 02/25/2024 0415   BUN 7 02/09/2024 1433   BUN 13 05/01/2013 1440   CREATININE 0.66 02/25/2024 0415   CREATININE 0.40 (L) 05/01/2013 1440   GLUCOSE 90 02/25/2024 0415   GLUCOSE 108 (H) 05/01/2013 1440   CALCIUM 9.7 02/25/2024 0415   CALCIUM 9.8 05/01/2013 1440   CBC:    Component Value Date/Time   WBC 10.5 02/25/2024 0415   HGB 7.1 (L) 02/25/2024 0415   HGB 8.3 (L) 02/09/2024 1433   HCT 21.9 (L) 02/25/2024 0415   HCT 25.2 (L) 02/09/2024 1433   PLT 468 (H) 02/25/2024 0415   PLT 359 02/09/2024 1433   MCV 94.8 02/25/2024 0415   MCV 98 (H) 02/09/2024 1433   MCV 89 05/01/2013 1440   NEUTROABS 9.2 (H) 02/19/2024 0100   NEUTROABS 7.3 (H) 02/09/2024 1433   NEUTROABS 7.9 05/30/2012 1958   LYMPHSABS 5.7 (H) 02/19/2024 0100   LYMPHSABS 2.8 02/09/2024 1433   LYMPHSABS 5.0 05/30/2012 1958   MONOABS 2.1 (H) 02/19/2024 0100   MONOABS 1.6 (H) 05/30/2012 1958   EOSABS 0.5 02/19/2024 0100   EOSABS  0.3 02/09/2024 1433   EOSABS 0.6 05/30/2012 1958   BASOSABS 0.2 (H) 02/19/2024 0100   BASOSABS 0.1 02/09/2024 1433   BASOSABS 3 04/28/2013 1736   BASOSABS 0.2 (H) 05/30/2012 1958    Recent Results (from the past 240 hours)  Resp panel by RT-PCR (RSV, Flu A&B, Covid) Anterior Nasal Swab     Status: None   Collection Time: 02/25/24  2:18 PM   Specimen: Anterior Nasal Swab  Result Value Ref Range Status   SARS Coronavirus 2 by RT PCR NEGATIVE NEGATIVE Final    Comment: (NOTE) SARS-CoV-2 target nucleic acids are NOT DETECTED.  The SARS-CoV-2 RNA is generally detectable in upper respiratory specimens during the acute phase of  infection. The lowest concentration of SARS-CoV-2 viral copies this assay can detect is 138 copies/mL. A negative result does not preclude SARS-Cov-2 infection and should not be used as the sole basis for treatment or other patient management decisions. A negative result may occur with  improper specimen collection/handling, submission of specimen other than nasopharyngeal swab, presence of viral mutation(s) within the areas targeted by this assay, and inadequate number of viral copies(<138 copies/mL). A negative result must be combined with clinical observations, patient history, and epidemiological information. The expected result is Negative.  Fact Sheet for Patients:  BloggerCourse.com  Fact Sheet for Healthcare Providers:  SeriousBroker.it  This test is no t yet approved or cleared by the [place]  FDA and  has been authorized for detection and/or diagnosis of SARS-CoV-2 by FDA under an Emergency Use Authorization (EUA). This EUA will remain  in effect (meaning this test can be used) for the duration of the COVID-19 declaration under Section 564(b)(1) of the Act, 21 U.S.C.section 360bbb-3(b)(1), unless the authorization is terminated  or revoked sooner.       Influenza A by PCR NEGATIVE NEGATIVE Final   Influenza B by PCR NEGATIVE NEGATIVE Final    Comment: (NOTE) The Xpert Xpress SARS-CoV-2/FLU/RSV plus assay is intended as an aid in the diagnosis of influenza from Nasopharyngeal swab specimens and should not be used as a sole basis for treatment. Nasal washings and aspirates are unacceptable for Xpert Xpress SARS-CoV-2/FLU/RSV testing.  Fact Sheet for Patients: BloggerCourse.com  Fact Sheet for Healthcare Providers: SeriousBroker.it  This test is not yet approved or cleared by the [place]  FDA and has been authorized for detection and/or diagnosis of SARS-CoV-2  by FDA under an Emergency Use Authorization (EUA). This EUA will remain in effect (meaning this test can be used) for the duration of the COVID-19 declaration under Section 564(b)(1) of the Act, 21 U.S.C. section 360bbb-3(b)(1), unless the authorization is terminated or revoked.     Resp Syncytial Virus by PCR NEGATIVE NEGATIVE Final    Comment: (NOTE) Fact Sheet for Patients: BloggerCourse.com  Fact Sheet for Healthcare Providers: SeriousBroker.it  This test is not yet approved or cleared by the [place]  FDA and has been authorized for detection and/or diagnosis of SARS-CoV-2 by FDA under an Emergency Use Authorization (EUA). This EUA will remain in effect (meaning this test can be used) for the duration of the COVID-19 declaration under Section 564(b)(1) of the Act, 21 U.S.C. section 360bbb-3(b)(1), unless the authorization is terminated or revoked.  Performed at [facility], [address] [place], [place] [address]     Studies/Results: No results found.  Medications: Scheduled Meds:  Chlorhexidine  Gluconate Cloth  6 each Topical Daily   fluticasone  furoate-vilanterol  1 puff Inhalation Daily   HYDROmorphone   Intravenous Q4H   hydroxyurea   1,500 mg Oral Daily   lubiprostone   24 mcg Oral Q breakfast   pantoprazole   40 mg Oral BID   pregabalin   150 mg Oral TID   senna-docusate  1 tablet Oral BID   sodium chloride  flush  10-40 mL Intracatheter Q12H   Continuous Infusions:   PRN Meds:.camphor-menthol, hydrOXYzine , ipratropium-albuterol , naloxone  **AND** sodium chloride  flush, ondansetron  **OR** ondansetron  (ZOFRAN ) IV, oxyCODONE , polyethylene glycol, sodium chloride  flush  Consultants: None  Procedures: None  Antibiotics: None  Assessment/Plan: Principal Problem:   Sickle cell crisis (HCC) Active Problems:   Chronic pain syndrome   Leukocytosis   GAD (generalized anxiety  disorder)   Anemia of chronic disease   UTI symptoms   Hb Sickle Cell Disease with Pain crisis: pain is worse today due to patient starting her  period. continue IVF @ KVO, continue Dilaudid  PCA at current dose, continue oral home pain medications as ordered. Monitor vitals very closely, Re-evaluate pain scale regularly, 2 L of Oxygen by [place].  Encouraged to use of heating pad. UTI Symptoms: Urinalysis negative for UTI Leukocytosis: Resolved Anemia of Chronic Disease: hgb below baseline no need for transfusion at this time, will monitor closely and transfuse appropriately. Chronic pain Syndrome: Continue oral home pain medication GAD ( generalized anxiety disorder): stable, continue medication/follow up as scheduled.   Code Status: Full Code Family Communication: N/A Disposition Plan: Not yet ready for discharge  Bailey Reese Cover NP  If 7PM-7AM, please contact night-coverage.  02/26/2024, 4:23 PM  LOS: 7 days

## 2024-02-26 NOTE — Plan of Care (Signed)

## 2024-02-26 NOTE — Plan of Care (Signed)
   Problem: Education: Goal: Knowledge of General Education information will improve Description: Including pain rating scale, medication(s)/side effects and non-pharmacologic comfort measures Outcome: Progressing   Problem: Nutrition: Goal: Adequate nutrition will be maintained Outcome: Progressing   Problem: Coping: Goal: Level of anxiety will decrease Outcome: Progressing

## 2024-02-27 LAB — CBC
HCT: 23.1 % — ABNORMAL LOW (ref 36.0–46.0)
Hemoglobin: 7.3 g/dL — ABNORMAL LOW (ref 12.0–15.0)
MCH: 30.5 pg (ref 26.0–34.0)
MCHC: 31.6 g/dL (ref 30.0–36.0)
MCV: 96.7 fL (ref 80.0–100.0)
Platelets: 573 K/uL — ABNORMAL HIGH (ref 150–400)
RBC: 2.39 MIL/uL — ABNORMAL LOW (ref 3.87–5.11)
RDW: 22.9 % — ABNORMAL HIGH (ref 11.5–15.5)
WBC: 13.2 K/uL — ABNORMAL HIGH (ref 4.0–10.5)
nRBC: 8.1 % — ABNORMAL HIGH (ref 0.0–0.2)

## 2024-02-27 MED ORDER — HYDROMORPHONE HCL 1 MG/ML IJ SOLN
1.0000 mg | Freq: Once | INTRAMUSCULAR | Status: AC
  Administered 2024-02-27: 1 mg via INTRAVENOUS
  Filled 2024-02-27: qty 1

## 2024-02-27 NOTE — Progress Notes (Signed)
 Patient ID: Bailey Reese, female   DOB: 04/06/2006, 18 y.o.   MRN: [id number] Subjective: Bailey Reese is a 18 y.o. female, with medical history significant of sickle cell disease, chronic pain, history of hemorrhagic stroke in 2017, asthma, PTSD, depression, anxiety, GERD presenting from [facility] ED due to concern for sickle cell pain crisis.   Patient was still crying this morning and reports that pain has worsened because she started her period yesterday and rated her pain on 9/10. denies subjective fever, Nausea, vomiting , diarrhea.    Objective:  Vital signs in last 24 hours:  Vitals:   02/27/24 1024 02/27/24 1113 02/27/24 1308 02/27/24 1521  BP: 122/66  109/70   Pulse: 92  80   Resp: 16 18 16 16   Temp: 97.9 F (36.6 C)  98.2 F (36.8 C)   TempSrc: Oral  Oral   SpO2: 99%  100%   Weight:      Height:        Intake/Output from previous day:  No intake or output data in the 24 hours ending 02/27/24 1547   Physical Exam: General: Alert, awake, oriented x3, in no acute distress.  HEENT: [place]/AT PEERL, EOMI Neck: Trachea midline,  no masses, no thyromegal,y no JVD, no carotid bruit OROPHARYNX:  Moist, No exudate/ erythema/lesions.  Heart: Regular rate and rhythm, without murmurs, rubs, gallops, PMI non-displaced, no heaves or thrills on palpation.  Lungs: Clear to auscultation, no wheezing or rhonchi noted. No increased vocal fremitus resonant to percussion  Abdomen: Soft, nontender, nondistended, positive bowel sounds, no masses no hepatosplenomegaly noted..  Neuro: No focal neurological deficits noted cranial nerves II through XII grossly intact. DTRs 2+ bilaterally upper and lower extremities. Strength 5 out of 5 in bilateral upper and lower extremities. Musculoskeletal: Generalize body tenderness, lower back/lower abdomen tenderness. Psychiatric: Patient alert and oriented x3, good insight and cognition, good recent to remote recall. Lymph node survey: No cervical  axillary or inguinal lymphadenopathy noted.  Lab Results:  Basic Metabolic Panel:    Component Value Date/Time   NA 140 02/25/2024 0415   NA 139 02/09/2024 1433   NA 135 05/01/2013 1440   K 4.3 02/25/2024 0415   K 4.3 05/01/2013 1440   CL 105 02/25/2024 0415   CL 102 05/01/2013 1440   CO2 25 02/25/2024 0415   CO2 27 (H) 05/01/2013 1440   BUN 13 02/25/2024 0415   BUN 7 02/09/2024 1433   BUN 13 05/01/2013 1440   CREATININE 0.66 02/25/2024 0415   CREATININE 0.40 (L) 05/01/2013 1440   GLUCOSE 90 02/25/2024 0415   GLUCOSE 108 (H) 05/01/2013 1440   CALCIUM 9.7 02/25/2024 0415   CALCIUM 9.8 05/01/2013 1440   CBC:    Component Value Date/Time   WBC 13.2 (H) 02/27/2024 0930   HGB 7.3 (L) 02/27/2024 0930   HGB 8.3 (L) 02/09/2024 1433   HCT 23.1 (L) 02/27/2024 0930   HCT 25.2 (L) 02/09/2024 1433   PLT 573 (H) 02/27/2024 0930   PLT 359 02/09/2024 1433   MCV 96.7 02/27/2024 0930   MCV 98 (H) 02/09/2024 1433   MCV 89 05/01/2013 1440   NEUTROABS 9.2 (H) 02/19/2024 0100   NEUTROABS 7.3 (H) 02/09/2024 1433   NEUTROABS 7.9 05/30/2012 1958   LYMPHSABS 5.7 (H) 02/19/2024 0100   LYMPHSABS 2.8 02/09/2024 1433   LYMPHSABS 5.0 05/30/2012 1958   MONOABS 2.1 (H) 02/19/2024 0100   MONOABS 1.6 (H) 05/30/2012 1958   EOSABS 0.5 02/19/2024 0100   EOSABS  0.3 02/09/2024 1433   EOSABS 0.6 05/30/2012 1958   BASOSABS 0.2 (H) 02/19/2024 0100   BASOSABS 0.1 02/09/2024 1433   BASOSABS 3 04/28/2013 1736   BASOSABS 0.2 (H) 05/30/2012 1958    Recent Results (from the past 240 hours)  Resp panel by RT-PCR (RSV, Flu A&B, Covid) Anterior Nasal Swab     Status: None   Collection Time: 02/25/24  2:18 PM   Specimen: Anterior Nasal Swab  Result Value Ref Range Status   SARS Coronavirus 2 by RT PCR NEGATIVE NEGATIVE Final    Comment: (NOTE) SARS-CoV-2 target nucleic acids are NOT DETECTED.  The SARS-CoV-2 RNA is generally detectable in upper respiratory specimens during the acute phase of infection.  The lowest concentration of SARS-CoV-2 viral copies this assay can detect is 138 copies/mL. A negative result does not preclude SARS-Cov-2 infection and should not be used as the sole basis for treatment or other patient management decisions. A negative result may occur with  improper specimen collection/handling, submission of specimen other than nasopharyngeal swab, presence of viral mutation(s) within the areas targeted by this assay, and inadequate number of viral copies(<138 copies/mL). A negative result must be combined with clinical observations, patient history, and epidemiological information. The expected result is Negative.  Fact Sheet for Patients:  BloggerCourse.com  Fact Sheet for Healthcare Providers:  SeriousBroker.it  This test is no t yet approved or cleared by the [place]  FDA and  has been authorized for detection and/or diagnosis of SARS-CoV-2 by FDA under an Emergency Use Authorization (EUA). This EUA will remain  in effect (meaning this test can be used) for the duration of the COVID-19 declaration under Section 564(b)(1) of the Act, 21 U.S.C.section 360bbb-3(b)(1), unless the authorization is terminated  or revoked sooner.       Influenza A by PCR NEGATIVE NEGATIVE Final   Influenza B by PCR NEGATIVE NEGATIVE Final    Comment: (NOTE) The Xpert Xpress SARS-CoV-2/FLU/RSV plus assay is intended as an aid in the diagnosis of influenza from Nasopharyngeal swab specimens and should not be used as a sole basis for treatment. Nasal washings and aspirates are unacceptable for Xpert Xpress SARS-CoV-2/FLU/RSV testing.  Fact Sheet for Patients: BloggerCourse.com  Fact Sheet for Healthcare Providers: SeriousBroker.it  This test is not yet approved or cleared by the [place]  FDA and has been authorized for detection and/or diagnosis of SARS-CoV-2 by FDA  under an Emergency Use Authorization (EUA). This EUA will remain in effect (meaning this test can be used) for the duration of the COVID-19 declaration under Section 564(b)(1) of the Act, 21 U.S.C. section 360bbb-3(b)(1), unless the authorization is terminated or revoked.     Resp Syncytial Virus by PCR NEGATIVE NEGATIVE Final    Comment: (NOTE) Fact Sheet for Patients: BloggerCourse.com  Fact Sheet for Healthcare Providers: SeriousBroker.it  This test is not yet approved or cleared by the [place]  FDA and has been authorized for detection and/or diagnosis of SARS-CoV-2 by FDA under an Emergency Use Authorization (EUA). This EUA will remain in effect (meaning this test can be used) for the duration of the COVID-19 declaration under Section 564(b)(1) of the Act, 21 U.S.C. section 360bbb-3(b)(1), unless the authorization is terminated or revoked.  Performed at [facility], [address], [place], [place] [address]     Studies/Results: No results found.  Medications: Scheduled Meds:  Chlorhexidine  Gluconate Cloth  6 each Topical Daily   fluticasone  furoate-vilanterol  1 puff Inhalation Daily   HYDROmorphone   Intravenous Q4H   hydroxyurea   1,500 mg Oral Daily   lubiprostone   24 mcg Oral Q breakfast   pantoprazole   40 mg Oral BID   pregabalin   150 mg Oral TID   senna-docusate  1 tablet Oral BID   sodium chloride  flush  10-40 mL Intracatheter Q12H   Continuous Infusions:   PRN Meds:.camphor-menthol, hydrOXYzine , ipratropium-albuterol , naloxone  **AND** sodium chloride  flush, ondansetron  **OR** ondansetron  (ZOFRAN ) IV, oxyCODONE , polyethylene glycol, sodium chloride  flush  Consultants: None  Procedures: None  Antibiotics: None  Assessment/Plan: Principal Problem:   Sickle cell crisis (HCC) Active Problems:   Chronic pain syndrome   Leukocytosis   GAD (generalized anxiety disorder)    Anemia of chronic disease   UTI symptoms   Hb Sickle Cell Disease with Pain crisis: pain is worse today due to patient starting her period, gave 1 mg IV dilaudid  once,  continue IVF @ KVO, continue Dilaudid  PCA at current dose, continue oral home pain medications as ordered. Monitor vitals very closely, Re-evaluate pain scale regularly, 2 L of Oxygen by [place].  Encouraged to use of heating pad. UTI Symptoms: Urinalysis negative for UTI Leukocytosis: slightly elevated, may be due to increase in pain, no s/s of infection, will continue to monitor without antibiotics.  Anemia of Chronic Disease: hgb below baseline no need for transfusion at this time, will monitor closely and transfuse appropriately. Chronic pain Syndrome: Continue oral home pain medication GAD ( generalized anxiety disorder): stable, continue medication/follow up as scheduled.   Code Status: Full Code Family Communication: N/A Disposition Plan: Not yet ready for discharge  [name] Cover NP  If 7PM-7AM, please contact night-coverage.  02/27/2024, 3:47 PM  LOS: 8 days

## 2024-02-27 NOTE — Plan of Care (Signed)

## 2024-02-28 LAB — CBC
HCT: 22.2 % — ABNORMAL LOW (ref 36.0–46.0)
Hemoglobin: 7.1 g/dL — ABNORMAL LOW (ref 12.0–15.0)
MCH: 30.6 pg (ref 26.0–34.0)
MCHC: 32 g/dL (ref 30.0–36.0)
MCV: 95.7 fL (ref 80.0–100.0)
Platelets: 538 K/uL — ABNORMAL HIGH (ref 150–400)
RBC: 2.32 MIL/uL — ABNORMAL LOW (ref 3.87–5.11)
RDW: 23.1 % — ABNORMAL HIGH (ref 11.5–15.5)
WBC: 11.7 K/uL — ABNORMAL HIGH (ref 4.0–10.5)
nRBC: 6.1 % — ABNORMAL HIGH (ref 0.0–0.2)

## 2024-02-28 NOTE — Plan of Care (Signed)

## 2024-02-28 NOTE — Progress Notes (Signed)
 Patient ID: ANALYSSA DOWNS, female   DOB: 2006-01-02, 18 y.o.   MRN: [id number] Subjective: Hallelujah Wysong is a 18 y.o. female, with medical history significant of sickle cell disease, chronic pain, history of hemorrhagic stroke in 2017, asthma, PTSD, depression, anxiety, GERD presenting from [facility] ED due to concern for sickle cell pain crisis.   Patient endorses pain of 8/10 this morning, she is crying and refuses to be consoled. She attributes pain to her period and states that  I have never cramped this bad before  denies subjective fever, Nausea, vomiting , diarrhea.    Objective:  Vital signs in last 24 hours:  Vitals:   02/28/24 0853 02/28/24 1142 02/28/24 1334 02/28/24 1545  BP: 118/68  (!) 135/91   Pulse: 83  100   Resp: 18 18 16 13   Temp: 98.1 F (36.7 C)     TempSrc: Oral     SpO2: 98%  100%   Weight:      Height:        Intake/Output from previous day:  No intake or output data in the 24 hours ending 02/28/24 1650   Physical Exam: General: Alert, awake, oriented x3, in no acute distress.  HEENT: [place]/AT PEERL, EOMI Neck: Trachea midline,  no masses, no thyromegal,y no JVD, no carotid bruit OROPHARYNX:  Moist, No exudate/ erythema/lesions.  Heart: Regular rate and rhythm, without murmurs, rubs, gallops, PMI non-displaced, no heaves or thrills on palpation.  Lungs: Clear to auscultation, no wheezing or rhonchi noted. No increased vocal fremitus resonant to percussion  Abdomen: lower abdominal tenderness Neuro: No focal neurological deficits noted cranial nerves II through XII grossly intact. DTRs 2+ bilaterally upper and lower extremities. Strength 5 out of 5 in bilateral upper and lower extremities. Musculoskeletal: Generalize body tenderness, lower back tenderness. Psychiatric: Patient alert and oriented x3, good insight and cognition, good recent to remote recall. Lymph node survey: No cervical axillary or inguinal lymphadenopathy noted.  Lab Results:  Basic  Metabolic Panel:    Component Value Date/Time   NA 140 02/25/2024 0415   NA 139 02/09/2024 1433   NA 135 05/01/2013 1440   K 4.3 02/25/2024 0415   K 4.3 05/01/2013 1440   CL 105 02/25/2024 0415   CL 102 05/01/2013 1440   CO2 25 02/25/2024 0415   CO2 27 (H) 05/01/2013 1440   BUN 13 02/25/2024 0415   BUN 7 02/09/2024 1433   BUN 13 05/01/2013 1440   CREATININE 0.66 02/25/2024 0415   CREATININE 0.40 (L) 05/01/2013 1440   GLUCOSE 90 02/25/2024 0415   GLUCOSE 108 (H) 05/01/2013 1440   CALCIUM 9.7 02/25/2024 0415   CALCIUM 9.8 05/01/2013 1440   CBC:    Component Value Date/Time   WBC 11.7 (H) 02/28/2024 0500   HGB 7.1 (L) 02/28/2024 0500   HGB 8.3 (L) 02/09/2024 1433   HCT 22.2 (L) 02/28/2024 0500   HCT 25.2 (L) 02/09/2024 1433   PLT 538 (H) 02/28/2024 0500   PLT 359 02/09/2024 1433   MCV 95.7 02/28/2024 0500   MCV 98 (H) 02/09/2024 1433   MCV 89 05/01/2013 1440   NEUTROABS 9.2 (H) 02/19/2024 0100   NEUTROABS 7.3 (H) 02/09/2024 1433   NEUTROABS 7.9 05/30/2012 1958   LYMPHSABS 5.7 (H) 02/19/2024 0100   LYMPHSABS 2.8 02/09/2024 1433   LYMPHSABS 5.0 05/30/2012 1958   MONOABS 2.1 (H) 02/19/2024 0100   MONOABS 1.6 (H) 05/30/2012 1958   EOSABS 0.5 02/19/2024 0100   EOSABS 0.3 02/09/2024 1433  EOSABS 0.6 05/30/2012 1958   BASOSABS 0.2 (H) 02/19/2024 0100   BASOSABS 0.1 02/09/2024 1433   BASOSABS 3 04/28/2013 1736   BASOSABS 0.2 (H) 05/30/2012 1958    Recent Results (from the past 240 hours)  Resp panel by RT-PCR (RSV, Flu A&B, Covid) Anterior Nasal Swab     Status: None   Collection Time: 02/25/24  2:18 PM   Specimen: Anterior Nasal Swab  Result Value Ref Range Status   SARS Coronavirus 2 by RT PCR NEGATIVE NEGATIVE Final    Comment: (NOTE) SARS-CoV-2 target nucleic acids are NOT DETECTED.  The SARS-CoV-2 RNA is generally detectable in upper respiratory specimens during the acute phase of infection. The lowest concentration of SARS-CoV-2 viral copies this assay can  detect is 138 copies/mL. A negative result does not preclude SARS-Cov-2 infection and should not be used as the sole basis for treatment or other patient management decisions. A negative result may occur with  improper specimen collection/handling, submission of specimen other than nasopharyngeal swab, presence of viral mutation(s) within the areas targeted by this assay, and inadequate number of viral copies(<138 copies/mL). A negative result must be combined with clinical observations, patient history, and epidemiological information. The expected result is Negative.  Fact Sheet for Patients:  BloggerCourse.com  Fact Sheet for Healthcare Providers:  SeriousBroker.it  This test is no t yet approved or cleared by the [place]  FDA and  has been authorized for detection and/or diagnosis of SARS-CoV-2 by FDA under an Emergency Use Authorization (EUA). This EUA will remain  in effect (meaning this test can be used) for the duration of the COVID-19 declaration under Section 564(b)(1) of the Act, 21 U.S.C.section 360bbb-3(b)(1), unless the authorization is terminated  or revoked sooner.       Influenza A by PCR NEGATIVE NEGATIVE Final   Influenza B by PCR NEGATIVE NEGATIVE Final    Comment: (NOTE) The Xpert Xpress SARS-CoV-2/FLU/RSV plus assay is intended as an aid in the diagnosis of influenza from Nasopharyngeal swab specimens and should not be used as a sole basis for treatment. Nasal washings and aspirates are unacceptable for Xpert Xpress SARS-CoV-2/FLU/RSV testing.  Fact Sheet for Patients: BloggerCourse.com  Fact Sheet for Healthcare Providers: SeriousBroker.it  This test is not yet approved or cleared by the [place]  FDA and has been authorized for detection and/or diagnosis of SARS-CoV-2 by FDA under an Emergency Use Authorization (EUA). This EUA will remain in  effect (meaning this test can be used) for the duration of the COVID-19 declaration under Section 564(b)(1) of the Act, 21 U.S.C. section 360bbb-3(b)(1), unless the authorization is terminated or revoked.     Resp Syncytial Virus by PCR NEGATIVE NEGATIVE Final    Comment: (NOTE) Fact Sheet for Patients: BloggerCourse.com  Fact Sheet for Healthcare Providers: SeriousBroker.it  This test is not yet approved or cleared by the [place]  FDA and has been authorized for detection and/or diagnosis of SARS-CoV-2 by FDA under an Emergency Use Authorization (EUA). This EUA will remain in effect (meaning this test can be used) for the duration of the COVID-19 declaration under Section 564(b)(1) of the Act, 21 U.S.C. section 360bbb-3(b)(1), unless the authorization is terminated or revoked.  Performed at [facility], [address] [place], [place] [address]     Studies/Results: No results found.  Medications: Scheduled Meds:  Chlorhexidine  Gluconate Cloth  6 each Topical Daily   fluticasone  furoate-vilanterol  1 puff Inhalation Daily   HYDROmorphone    Intravenous Q4H   hydroxyurea   1,500 mg Oral Daily   lubiprostone   24 mcg Oral Q breakfast   pantoprazole   40 mg Oral BID   pregabalin   150 mg Oral TID   senna-docusate  1 tablet Oral BID   sodium chloride  flush  10-40 mL Intracatheter Q12H   Continuous Infusions:   PRN Meds:.camphor-menthol, hydrOXYzine , ipratropium-albuterol , naloxone  **AND** sodium chloride  flush, ondansetron  **OR** ondansetron  (ZOFRAN ) IV, oxyCODONE , polyethylene glycol, sodium chloride  flush  Consultants: None  Procedures: None  Antibiotics: None  Assessment/Plan: Principal Problem:   Sickle cell crisis (HCC) Active Problems:   Chronic pain syndrome   Leukocytosis   GAD (generalized anxiety disorder)   Anemia of chronic disease   UTI symptoms   Hb Sickle Cell Disease  with Pain crisis: pain is not resolved due to cramps from menstrual circle. continue IVF @ KVO, continue Dilaudid  PCA at current dose, continue oral home pain medications as ordered. Monitor vitals very closely, Re-evaluate pain scale regularly, 2 L of Oxygen by [place].  Encouraged to use of heating pad. UTI Symptoms: Urinalysis negative for UTI Leukocytosis: slightly elevated, may be due to increase in pain, no s/s of infection, will continue to monitor without antibiotics.  Anemia of Chronic Disease: hgb below baseline no need for transfusion at this time, will monitor closely and transfuse appropriately. Chronic pain Syndrome: Continue oral home pain medication GAD ( generalized anxiety disorder): stable, continue medication/follow up as scheduled.   Code Status: Full Code Family Communication: N/A Disposition Plan: Not yet ready for discharge  [name] Cover NP  If 7PM-7AM, please contact night-coverage.  02/28/2024, 4:50 PM  LOS: 9 days

## 2024-02-29 LAB — CBC
HCT: 21.5 % — ABNORMAL LOW (ref 36.0–46.0)
Hemoglobin: 7.2 g/dL — ABNORMAL LOW (ref 12.0–15.0)
MCH: 32.1 pg (ref 26.0–34.0)
MCHC: 33.5 g/dL (ref 30.0–36.0)
MCV: 96 fL (ref 80.0–100.0)
Platelets: 559 K/uL — ABNORMAL HIGH (ref 150–400)
RBC: 2.24 MIL/uL — ABNORMAL LOW (ref 3.87–5.11)
RDW: 23.7 % — ABNORMAL HIGH (ref 11.5–15.5)
WBC: 11.3 K/uL — ABNORMAL HIGH (ref 4.0–10.5)
nRBC: 5.2 % — ABNORMAL HIGH (ref 0.0–0.2)

## 2024-02-29 NOTE — Discharge Summary (Signed)
 Physician Discharge Summary  Bailey Reese FMW:[id number] DOB: August 10, 2005 DOA: 02/19/2024  PCP: Morgan Slater Pizza, MD  Admit date: 02/19/2024  Discharge date: 02/29/2024  Discharge Diagnoses:  Principal Problem:   Sickle cell crisis (HCC) Active Problems:   Chronic pain syndrome   Leukocytosis   GAD (generalized anxiety disorder)   Anemia of chronic disease   UTI symptoms   Discharge Condition: Stable  Disposition:  Pt is discharged home in good condition and is to follow up with Little, Slater Pizza, MD this week to have labs evaluated. Bailey Reese is instructed to increase activity slowly and balance with rest for the next few days, and use prescribed medication to complete treatment of pain  Diet: Regular Wt Readings from Last 3 Encounters:  02/19/24 70.4 kg (84%, Z= 0.99)*  02/18/24 70.3 kg (84%, Z= 0.99)*  02/17/24 70.3 kg (84%, Z= 0.99)*   * Growth percentiles are based on CDC (Girls, 2-20 Years) data.    History of present illness:  Bailey Reese is a 18 y.o. female, with medical history significant of sickle cell disease, chronic pain, history of hemorrhagic stroke in 2017, asthma, PTSD, depression, anxiety, GERD presenting from [facility] ED due to concern for sickle cell pain crisis. Recent MSSA bacteremia (actively receiving treatment with linezolid ).  ED Course:  BP 116/83, Pulse 90, Resp 16, Temp 97.6, SpO2 98% Patient was treated with IVF, IV pain medication, with no resolution to pain, she labs returned with low hgb of 6.5 in the ED and was transfused with 1 unit PRBCs prior to transferring to [facility]. She was admitted for ongoing sickle cell pain  management.  Retic Ct Pct 17.6 (*)       RBC. 1.90 (*)      Retic Count, Absolute 334.0 (*)      Immature Retic Fract 32.0 (*)     WBC 17.9 (*)       RBC 2.09 (*)      Hemoglobin 6.5 (*)      HCT 19.6 (*)      RDW 23.9 (*)      nRBC 2.2 (*)      Neutro Abs 9.2 (*)      Lymphs Abs 5.7 (*)      Monocytes  Absolute 2.1 (*)      Basophils Absolute 0.2 (*)      Abs Immature Granulocytes 0.25 (*)    Hospital Course:  Patient was admitted for sickle cell pain crisis and managed appropriately with IVF, IV Dilaudid  via PCA and IV Toradol , as well as other adjunct therapies per sickle cell pain management protocols. Patient is reporting improved pain today, she is tolerating PO without Nausea or vomiting, she is ambulating without assistance and has no new concerns.  Patient was therefore discharged home today in a hemodynamically stable condition.   Cheree will follow-up with PCP.  Bailey Reese was counseled extensively about nonpharmacologic means of pain management, patient verbalized understanding and was appreciative of  the care received during this admission.   We discussed the need for good hydration, monitoring of hydration status, avoidance of heat, cold, stress, and infection triggers. We discussed the need to be adherent with taking her home medications. Patient was reminded of the need to seek medical attention immediately if any symptom of bleeding, anemia, or infection occurs.  Discharge Exam: Vitals:   02/29/24 1029 02/29/24 1214  BP: 115/66   Pulse: 90   Resp: 20 12  Temp: 97.7 F (36.5 C)  SpO2: 99%    Vitals:   02/29/24 0520 02/29/24 0754 02/29/24 1029 02/29/24 1214  BP: 121/67  115/66   Pulse: 84  90   Resp: 14 16 20 12   Temp: 97.9 F (36.6 C)  97.7 F (36.5 C)   TempSrc: Oral  Oral   SpO2: 96%  99%   Weight:      Height:        General appearance : Awake, alert, not in any distress. Speech Clear. Not toxic looking HEENT: Atraumatic and Normocephalic, pupils equally reactive to light and accomodation Neck: Supple, no JVD. No cervical lymphadenopathy.  Chest: Good air entry bilaterally, no added sounds  CVS: S1 S2 regular, no murmurs.  Abdomen: Bowel sounds present, Non tender and not distended with no gaurding, rigidity or rebound. Extremities: B/L Lower Ext shows  no edema, both legs are warm to touch Neurology: Awake alert, and oriented X 3, CN II-XII intact, Non focal Skin: No Rash  Discharge Instructions  Discharge Instructions     Call MD for:  persistant nausea and vomiting   Complete by: As directed    Call MD for:  severe uncontrolled pain   Complete by: As directed    Call MD for:  temperature >100.4   Complete by: As directed    Diet - low sodium heart healthy   Complete by: As directed    Increase activity slowly   Complete by: As directed       Allergies as of 02/29/2024       Reactions   Cherry Anaphylaxis, Itching, Swelling   Droperidol  Anxiety, Other (See Comments)   Droperidol  is a medication that prevents nausea and vomiting caused by surgical procedures. The brand name of this medication is Inapsine . = Pt states muscle tension, involuntary movements, eye drifting, and SEIZURES   Olanzapine  Other (See Comments)   Drug-induced liver injury   Other Anaphylaxis, Itching, Swelling, Other (See Comments)   Pitted fruits - Throat itches ALSO- THE PATIENT IS PRONE TO HAVE SEIZURES.   Peanut-containing Drug Products Anaphylaxis, Itching, Swelling   Plum Pulp Anaphylaxis, Hives   Morphine  And Codeine Hives, Itching   Peach [prunus Persica] Itching, Swelling, Other (See Comments)   Acetaminophen  Nausea And Vomiting   Compazine  [prochlorperazine ] Other (See Comments), Hypertension   Patient stated that this medication causes her HR to increase and well as BP. She also states that it cause hallucinations.   Ibuprofen  Nausea And Vomiting        Medication List     STOP taking these medications    linezolid  600 MG tablet Commonly known as: ZYVOX        TAKE these medications    budesonide-formoterol  80-4.5 MCG/ACT inhaler Commonly known as: SYMBICORT Inhale 2 puffs into the lungs in the morning and at bedtime.   CALCIUM 600+D3 PO Take 2 tablets by mouth daily.   ergocalciferol 1.25 MG (50000 UT)  capsule Commonly known as: VITAMIN D2 Take 1 capsule (50,000 Units total) by mouth once a week. What changed: when to take this   esomeprazole  40 MG capsule Commonly known as: NEXIUM  Take 1 capsule (40 mg total) by mouth daily as needed (reflux).   fexofenadine 180 MG tablet Commonly known as: ALLEGRA Take 180 mg by mouth in the morning.   hydroxyurea  500 MG capsule Commonly known as: HYDREA  Take 1,500 mg by mouth daily. May take with food to minimize GI side effects.   hydrOXYzine  25 MG tablet Commonly known as: ATARAX  Take 25  mg by mouth See admin instructions. Take 25 mg by mouth twice a day and an additional 25 mg once a day as needed for anxiety   lubiprostone  24 MCG capsule Commonly known as: AMITIZA  Take 24 mcg by mouth in the morning and at bedtime.   ondansetron  4 MG tablet Commonly known as: ZOFRAN  Take 4 mg by mouth See admin instructions. Take 4 mg by mouth up to 4 times a day   Oxycodone  HCl 10 MG Tabs Take 1 tablet (10 mg total) by mouth every 6 (six) hours as needed (pain). What changed:  when to take this additional instructions   pantoprazole  40 MG tablet Commonly known as: Protonix  Take 1 tablet (40 mg total) by mouth 2 (two) times daily. What changed:  when to take this reasons to take this   prazosin  1 MG capsule Commonly known as: MINIPRESS  Take 1 mg by mouth at bedtime.   pregabalin  150 MG capsule Commonly known as: LYRICA  Take 1 capsule (150 mg total) by mouth 3 (three) times daily.   scopolamine 1 MG/3DAYS Commonly known as: TRANSDERM-SCOP Place 1 patch onto the skin every 3 (three) days.        The results of significant diagnostics from this hospitalization (including imaging, microbiology, ancillary and laboratory) are listed below for reference.    Significant Diagnostic Studies: DG Chest 2 View Result Date: 01/31/2024 CLINICAL DATA:  Chest pain EXAM: CHEST - 2 VIEW COMPARISON:  01/21/2019 FINDINGS: New left PICC is noted with  the catheter tip in the mid superior vena cava. The lungs are clear. No bony abnormality is noted. Cardiac shadow is unremarkable. IMPRESSION: No acute abnormality noted. Electronically Signed   By: Oneil Devonshire M.D.   On: 01/31/2024 19:15    Microbiology: Recent Results (from the past 240 hours)  Resp panel by RT-PCR (RSV, Flu A&B, Covid) Anterior Nasal Swab     Status: None   Collection Time: 02/25/24  2:18 PM   Specimen: Anterior Nasal Swab  Result Value Ref Range Status   SARS Coronavirus 2 by RT PCR NEGATIVE NEGATIVE Final    Comment: (NOTE) SARS-CoV-2 target nucleic acids are NOT DETECTED.  The SARS-CoV-2 RNA is generally detectable in upper respiratory specimens during the acute phase of infection. The lowest concentration of SARS-CoV-2 viral copies this assay can detect is 138 copies/mL. A negative result does not preclude SARS-Cov-2 infection and should not be used as the sole basis for treatment or other patient management decisions. A negative result may occur with  improper specimen collection/handling, submission of specimen other than nasopharyngeal swab, presence of viral mutation(s) within the areas targeted by this assay, and inadequate number of viral copies(<138 copies/mL). A negative result must be combined with clinical observations, patient history, and epidemiological information. The expected result is Negative.  Fact Sheet for Patients:  BloggerCourse.com  Fact Sheet for Healthcare Providers:  SeriousBroker.it  This test is no t yet approved or cleared by the [place]  FDA and  has been authorized for detection and/or diagnosis of SARS-CoV-2 by FDA under an Emergency Use Authorization (EUA). This EUA will remain  in effect (meaning this test can be used) for the duration of the COVID-19 declaration under Section 564(b)(1) of the Act, 21 U.S.C.section 360bbb-3(b)(1), unless the authorization is  terminated  or revoked sooner.       Influenza A by PCR NEGATIVE NEGATIVE Final   Influenza B by PCR NEGATIVE NEGATIVE Final    Comment: (NOTE) The Xpert Xpress SARS-CoV-2/FLU/RSV plus  assay is intended as an aid in the diagnosis of influenza from Nasopharyngeal swab specimens and should not be used as a sole basis for treatment. Nasal washings and aspirates are unacceptable for Xpert Xpress SARS-CoV-2/FLU/RSV testing.  Fact Sheet for Patients: BloggerCourse.com  Fact Sheet for Healthcare Providers: SeriousBroker.it  This test is not yet approved or cleared by the [place]  FDA and has been authorized for detection and/or diagnosis of SARS-CoV-2 by FDA under an Emergency Use Authorization (EUA). This EUA will remain in effect (meaning this test can be used) for the duration of the COVID-19 declaration under Section 564(b)(1) of the Act, 21 U.S.C. section 360bbb-3(b)(1), unless the authorization is terminated or revoked.     Resp Syncytial Virus by PCR NEGATIVE NEGATIVE Final    Comment: (NOTE) Fact Sheet for Patients: BloggerCourse.com  Fact Sheet for Healthcare Providers: SeriousBroker.it  This test is not yet approved or cleared by the [place]  FDA and has been authorized for detection and/or diagnosis of SARS-CoV-2 by FDA under an Emergency Use Authorization (EUA). This EUA will remain in effect (meaning this test can be used) for the duration of the COVID-19 declaration under Section 564(b)(1) of the Act, 21 U.S.C. section 360bbb-3(b)(1), unless the authorization is terminated or revoked.  Performed at [facility], [address], [place], [place] [address]      Labs: Basic Metabolic Panel: Recent Labs  Lab 02/25/24 0415  NA 140  K 4.3  CL 105  CO2 25  GLUCOSE 90  BUN 13  CREATININE 0.66  CALCIUM 9.7   Liver Function  Tests: Recent Labs  Lab 02/25/24 0415  AST 57*  ALT 78*  ALKPHOS 165*  BILITOT 5.1*  PROT 7.6  ALBUMIN 4.1   No results for input(s): LIPASE, AMYLASE in the last 168 hours. No results for input(s): AMMONIA in the last 168 hours. CBC: Recent Labs  Lab 02/23/24 0944 02/25/24 0415 02/27/24 0930 02/28/24 0500 02/29/24 0530  WBC 9.8 10.5 13.2* 11.7* 11.3*  HGB 7.9* 7.1* 7.3* 7.1* 7.2*  HCT 24.1* 21.9* 23.1* 22.2* 21.5*  MCV 93.4 94.8 96.7 95.7 96.0  PLT 452* 468* 573* 538* 559*   Cardiac Enzymes: No results for input(s): CKTOTAL, CKMB, CKMBINDEX, TROPONINI in the last 168 hours. BNP: Invalid input(s): POCBNP CBG: No results for input(s): GLUCAP in the last 168 hours.  Time coordinating discharge: 50 minutes  Signed:  [name] Cover NP   [facility] 02/29/2024, 4:41 PM

## 2024-02-29 NOTE — Plan of Care (Signed)
  Problem: Bowel/Gastric: Goal: Gut motility will be maintained Outcome: Progressing   Problem: Tissue Perfusion: Goal: Complications related to inadequate tissue perfusion will be avoided or minimized Outcome: Progressing   Problem: Respiratory: Goal: Pulmonary complications will be avoided or minimized Outcome: Progressing Goal: Acute Chest Syndrome will be identified early to prevent complications Outcome: Progressing   Problem: Health Behavior: Goal: Postive changes in compliance with treatment and prescription regimens will improve Outcome: Progressing

## 2024-02-29 NOTE — Plan of Care (Signed)
  Problem: Education: Goal: Knowledge of General Education information will improve Description: Including pain rating scale, medication(s)/side effects and non-pharmacologic comfort measures Outcome: Completed/Met   Problem: Health Behavior/Discharge Planning: Goal: Ability to manage health-related needs will improve Outcome: Completed/Met   Problem: Clinical Measurements: Goal: Ability to maintain clinical measurements within normal limits will improve Outcome: Completed/Met Goal: Will remain free from infection Outcome: Completed/Met Goal: Diagnostic test results will improve Outcome: Completed/Met Goal: Respiratory complications will improve Outcome: Completed/Met Goal: Cardiovascular complication will be avoided Outcome: Completed/Met   Problem: Activity: Goal: Risk for activity intolerance will decrease Outcome: Completed/Met   Problem: Nutrition: Goal: Adequate nutrition will be maintained Outcome: Completed/Met   Problem: Coping: Goal: Level of anxiety will decrease Outcome: Completed/Met   Problem: Elimination: Goal: Will not experience complications related to bowel motility Outcome: Completed/Met Goal: Will not experience complications related to urinary retention Outcome: Completed/Met   Problem: Pain Managment: Goal: General experience of comfort will improve and/or be controlled Outcome: Completed/Met   Problem: Safety: Goal: Ability to remain free from injury will improve Outcome: Completed/Met   Problem: Skin Integrity: Goal: Risk for impaired skin integrity will decrease Outcome: Completed/Met   Problem: Education: Goal: Knowledge of vaso-occlusive preventative measures will improve Outcome: Completed/Met Goal: Awareness of infection prevention will improve Outcome: Completed/Met Goal: Awareness of signs and symptoms of anemia will improve Outcome: Completed/Met Goal: Long-term complications will improve Outcome: Completed/Met   Problem:  Self-Care: Goal: Ability to incorporate actions that prevent/reduce pain crisis will improve Outcome: Completed/Met   Problem: Bowel/Gastric: Goal: Gut motility will be maintained Outcome: Completed/Met   Problem: Tissue Perfusion: Goal: Complications related to inadequate tissue perfusion will be avoided or minimized Outcome: Completed/Met   Problem: Respiratory: Goal: Pulmonary complications will be avoided or minimized Outcome: Completed/Met Goal: Acute Chest Syndrome will be identified early to prevent complications Outcome: Completed/Met   Problem: Fluid Volume: Goal: Ability to maintain a balanced intake and output will improve Outcome: Completed/Met   Problem: Sensory: Goal: Pain level will decrease with appropriate interventions Outcome: Completed/Met   Problem: Health Behavior: Goal: Postive changes in compliance with treatment and prescription regimens will improve Outcome: Completed/Met

## 2024-03-01 ENCOUNTER — Emergency Department

## 2024-03-01 ENCOUNTER — Emergency Department
Admission: EM | Admit: 2024-03-01 | Discharge: 2024-03-02 | Disposition: A | Discharge status: Home / Self Care | Attending: Emergency Medicine | Admitting: Emergency Medicine

## 2024-03-01 ENCOUNTER — Other Ambulatory Visit: Payer: Self-pay

## 2024-03-01 DIAGNOSIS — R4182 Altered mental status, unspecified: Secondary | ICD-10-CM | POA: Diagnosis present

## 2024-03-01 DIAGNOSIS — J45909 Unspecified asthma, uncomplicated: Secondary | ICD-10-CM | POA: Insufficient documentation

## 2024-03-01 DIAGNOSIS — D5700 Hb-SS disease with crisis, unspecified: Secondary | ICD-10-CM | POA: Diagnosis not present

## 2024-03-01 DIAGNOSIS — R410 Disorientation, unspecified: Secondary | ICD-10-CM

## 2024-03-01 LAB — COMPREHENSIVE METABOLIC PANEL WITH GFR
ALT: 40 U/L (ref 0–44)
AST: 43 U/L — ABNORMAL HIGH (ref 15–41)
Albumin: 4.5 g/dL (ref 3.5–5.0)
Alkaline Phosphatase: 136 U/L — ABNORMAL HIGH (ref 38–126)
Anion gap: 13 (ref 5–15)
BUN: 9 mg/dL (ref 6–20)
CO2: 20 mmol/L — ABNORMAL LOW (ref 22–32)
Calcium: 9.8 mg/dL (ref 8.9–10.3)
Chloride: 103 mmol/L (ref 98–111)
Creatinine, Ser: 0.44 mg/dL (ref 0.44–1.00)
GFR, Estimated: >60 mL/min (ref >60)
Glucose, Bld: 88 mg/dL (ref 70–99)
Potassium: 3.7 mmol/L (ref 3.5–5.1)
Sodium: 136 mmol/L (ref 135–145)
Total Bilirubin: 5.3 mg/dL — ABNORMAL HIGH (ref 0.0–1.2)
Total Protein: 9.2 g/dL — ABNORMAL HIGH (ref 6.5–8.1)

## 2024-03-01 LAB — CBC WITH DIFFERENTIAL/PLATELET
Abs Immature Granulocytes: 0.04 K/uL (ref 0.00–0.07)
Basophils Absolute: 0.1 K/uL (ref 0.0–0.1)
Basophils Relative: 1 %
Eosinophils Absolute: 0.2 K/uL (ref 0.0–0.5)
Eosinophils Relative: 2 %
HCT: 21.9 % — ABNORMAL LOW (ref 36.0–46.0)
Hemoglobin: 7.4 g/dL — ABNORMAL LOW (ref 12.0–15.0)
Immature Granulocytes: 0 %
Lymphocytes Relative: 31 %
Lymphs Abs: 3.4 K/uL (ref 0.7–4.0)
MCH: 32.2 pg (ref 26.0–34.0)
MCHC: 33.8 g/dL (ref 30.0–36.0)
MCV: 95.2 fL (ref 80.0–100.0)
Monocytes Absolute: 1.4 K/uL — ABNORMAL HIGH (ref 0.1–1.0)
Monocytes Relative: 13 %
Neutro Abs: 5.9 K/uL (ref 1.7–7.7)
Neutrophils Relative %: 53 %
Platelets: 530 K/uL — ABNORMAL HIGH (ref 150–400)
RBC: 2.3 MIL/uL — ABNORMAL LOW (ref 3.87–5.11)
RDW: 23.6 % — ABNORMAL HIGH (ref 11.5–15.5)
Smear Review: NORMAL
WBC: 11 K/uL — ABNORMAL HIGH (ref 4.0–10.5)
nRBC: 3.1 % — ABNORMAL HIGH (ref 0.0–0.2)

## 2024-03-01 LAB — RESP PANEL BY RT-PCR (RSV, FLU A&B, COVID)  RVPGX2
Influenza A by PCR: NEGATIVE
Influenza B by PCR: NEGATIVE
Resp Syncytial Virus by PCR: NEGATIVE
SARS Coronavirus 2 by RT PCR: NEGATIVE

## 2024-03-01 LAB — ACETAMINOPHEN LEVEL: Acetaminophen (Tylenol), Serum: <10 ug/mL — ABNORMAL LOW (ref 10–30)

## 2024-03-01 LAB — SALICYLATE LEVEL: Salicylate Lvl: <7 mg/dL — ABNORMAL LOW (ref 7.0–30.0)

## 2024-03-01 LAB — RETICULOCYTES
Immature Retic Fract: 36.1 % — ABNORMAL HIGH (ref 2.3–15.9)
RBC.: 2.1 MIL/uL — ABNORMAL LOW (ref 3.87–5.11)
Retic Count, Absolute: 321 K/uL — ABNORMAL HIGH (ref 19.0–186.0)
Retic Ct Pct: 15.3 % — ABNORMAL HIGH (ref 0.4–3.1)

## 2024-03-01 LAB — TROPONIN I (HIGH SENSITIVITY): Troponin I (High Sensitivity): 5 ng/L (ref <18)

## 2024-03-01 LAB — LACTIC ACID, PLASMA: Lactic Acid, Venous: 1.4 mmol/L (ref 0.5–1.9)

## 2024-03-01 MED ORDER — LORAZEPAM 2 MG/ML IJ SOLN
1.0000 mg | Freq: Once | INTRAMUSCULAR | Status: AC
  Administered 2024-03-01: 1 mg via INTRAVENOUS
  Filled 2024-03-01: qty 1

## 2024-03-01 NOTE — ED Notes (Signed)
 Pt transported to CT ?

## 2024-03-01 NOTE — ED Notes (Signed)
 EDP at beside talking to mom and pt.

## 2024-03-01 NOTE — ED Provider Notes (Signed)
 [facility] Provider Note    Event Date/Time   First MD Initiated Contact with Patient 03/01/24 2103     (approximate)   History   Altered Mental Status   HPI  Bailey Reese is a 18 y.o. female with a history of sickle cell disease, chronic pain, hemorrhagic stroke, asthma, PTSD, depression, anxiety, GERD who presents with altered mental status.  Per EMS, the patient's mother reported that she became confused approximately an hour prior to coming to the hospital.  The patient herself states that she was in [place] and was walking too much, and is having severe pain all over.  She reports pain to her head and her entire body.  However, the patient is difficult to obtain history from.  She is extremely anxious, tearful, I am unable to answer certain questions.  When asked her name she states that she does not remember it although confirms that when I tell her her name.  She is able to answer other questions appropriately.  She appears very anxious about having her hair cut off, which we reassured her we would not do.  I reviewed the past medical records.  The patient was just admitted to the hospitalist service at [facility] from 10/6 until yesterday for an acute pain crisis.  She required transfusion of PRBCs.   Physical Exam   Triage Vital Signs: ED Triage Vitals  Encounter Vitals Group     BP      Girls Systolic BP Percentile      Girls Diastolic BP Percentile      Boys Systolic BP Percentile      Boys Diastolic BP Percentile      Pulse      Resp      Temp      Temp src      SpO2      Weight      Height      Head Circumference      Peak Flow      Pain Score      Pain Loc      Pain Education      Exclude from Growth Chart     Most recent vital signs: Vitals:   03/01/24 2130 03/01/24 2200  BP: 123/79 (!) 105/34  Pulse: 98 (!) 101  Resp: 12   Temp:    SpO2: 99% 100%     General: Alert, somewhat confused appearing, anxious, tearful.   CV:  Good peripheral perfusion.  Resp:  Normal effort.  Lungs CTAB. Abd:  No distention.  Other:  EOMI.  PERRLA.  No photophobia.  No aphasia or dysarthria.  Motor intact in all extremities.  No ataxia.  Very anxious appearing, tearful, with somewhat pressured speech.   ED Results / Procedures / Treatments   Labs (all labs ordered are listed, but only abnormal results are displayed) Labs Reviewed  COMPREHENSIVE METABOLIC PANEL WITH GFR - Abnormal; Notable for the following components:      Result Value   CO2 20 (*)    Total Protein 9.2 (*)    AST 43 (*)    Alkaline Phosphatase 136 (*)    Total Bilirubin 5.3 (*)    All other components within normal limits  ACETAMINOPHEN  LEVEL - Abnormal; Notable for the following components:   Acetaminophen  (Tylenol ), Serum <10 (*)    All other components within normal limits  SALICYLATE LEVEL - Abnormal; Notable for the following components:   Salicylate Lvl <7.0 (*)  All other components within normal limits  CBC WITH DIFFERENTIAL/PLATELET - Abnormal; Notable for the following components:   RBC 2.30 (*)    Hemoglobin 7.4 (*)    HCT 21.9 (*)    RDW 23.6 (*)    Platelets 530 (*)    All other components within normal limits  RETICULOCYTES - Abnormal; Notable for the following components:   Retic Ct Pct 15.3 (*)    RBC. 2.10 (*)    Retic Count, Absolute 321.0 (*)    Immature Retic Fract 36.1 (*)    All other components within normal limits  RESP PANEL BY RT-PCR (RSV, FLU A&B, COVID)  RVPGX2  CULTURE, BLOOD (ROUTINE X 2)  CULTURE, BLOOD (ROUTINE X 2)  LACTIC ACID, PLASMA  CBC WITH DIFFERENTIAL/PLATELET  URINALYSIS, ROUTINE W REFLEX MICROSCOPIC  URINE DRUG SCREEN, QUALITATIVE ([facility] ONLY)  TROPONIN I (HIGH SENSITIVITY)     EKG  ED ECG REPORT I, Waylon Cassis, the attending physician, personally viewed and interpreted this ECG.  Date: 03/01/2024 EKG Time: 2114 Rate: 97 Rhythm: normal sinus rhythm QRS Axis:  normal Intervals: normal ST/T Wave abnormalities: normal Narrative Interpretation: no evidence of acute ischemia    RADIOLOGY  CT head: I independently viewed and interpreted the images; there is no ICH.  Radiology report indicates no acute abnormality  Chest x-ray: No focal consolidation or edema  PROCEDURES:  Critical Care performed: Yes, see critical care procedure note(s)  .Critical Care  Performed by: Cassis Waylon, MD Authorized by: Cassis Waylon, MD   Critical care provider statement:    Critical care time (minutes):  30   Critical care time was exclusive of:  Separately billable procedures and treating other patients   Critical care was necessary to treat or prevent imminent or life-threatening deterioration of the following conditions:  CNS failure or compromise   Critical care was time spent personally by me on the following activities:  Development of treatment plan with patient or surrogate, discussions with consultants, evaluation of patient's response to treatment, examination of patient, ordering and review of laboratory studies, ordering and review of radiographic studies, ordering and performing treatments and interventions, pulse oximetry, re-evaluation of patient's condition, review of old charts and obtaining history from patient or surrogate    MEDICATIONS ORDERED IN ED: Medications  LORazepam  (ATIVAN ) injection 1 mg (1 mg Intravenous Given 03/01/24 2115)     IMPRESSION / MDM / ASSESSMENT AND PLAN / ED COURSE  I reviewed the triage vital signs and the nursing notes.  18 year old female with PMH as noted above, just admitted at [facility] for sickle cell crisis and discharged yesterday, presents with acute onset of altered mental status this evening.  On exam, the patient is alert, somewhat confused appearing, very anxious, with pressured speech.  She is hypertensive, borderline tachycardic, with otherwise normal vital signs.  Neurologic exam is  nonfocal.  Differential diagnosis includes, but is not limited to: intracranial hemorrhage, acute ischemic CVA, medication withdrawal, drug intoxication, electrolyte abnormality other metabolic cause, acute infection/sepsis, other acute encephalopathy, acute anxiety/panic, conversion disorder or other psychiatric etiology.  We will obtain lab workup, CT head, give an initial dose of Ativan , and reassess.  Patient's presentation is most consistent with acute presentation with potential threat to life or bodily function.  The patient is on the cardiac monitor to evaluate for evidence of arrhythmia and/or significant heart rate changes.  ----------------------------------------- 11:22 PM on 03/01/2024 -----------------------------------------  Lab workup so far is reassuring.  CMP shows elevated bilirubin and alkaline  phosphatase which are chronic.  CBC shows stable anemia.  Troponin and lactate are negative.  Urinalysis is still pending.  Respiratory panel is negative.  CT head shows no acute abnormalities.  Chest x-ray is clear.  The patient is now sleeping comfortably.  If the remainder of the workup is negative and when the patient awake she is back to baseline, she likely will be able to go home.  She remains altered or agitated she will need reassessment.  She has been signed out to the oncoming ED physician Dr. Dorothyann.   FINAL CLINICAL IMPRESSION(S) / ED DIAGNOSES   Final diagnoses:  Delirium     Rx / DC Orders   ED Discharge Orders     None        Note:  This document was prepared using Dragon voice recognition software and may include unintentional dictation errors.    Jacolyn Pae, MD 03/01/24 2324

## 2024-03-01 NOTE — ED Triage Notes (Signed)
 Pt to ED from home via [facility] for AMS. Pt discharged from [facility] yesterday for a Sickle Cell Treatment. According to pt's mother this AMS started about an hour before EMS was called.   Vitals within normal limits for EMS.  Pt A&Ox3.   Pt acting erratically upon arrival. EDP at beside.

## 2024-03-02 MED ORDER — HYDROMORPHONE HCL 1 MG/ML IJ SOLN
1.0000 mg | Freq: Once | INTRAMUSCULAR | Status: AC
  Administered 2024-03-02: 1 mg via INTRAVENOUS
  Filled 2024-03-02: qty 1

## 2024-03-02 NOTE — Discharge Instructions (Signed)
 Please follow-up with your doctor as soon as you are able.  Return to the emergency department for any symptoms personally concerning to yourself.

## 2024-03-02 NOTE — ED Notes (Signed)
 RN tried to wake pt up to see if pt could provide a urine sample. Pt is to sleepy. EDP updated.

## 2024-03-02 NOTE — ED Provider Notes (Signed)
 Patient is now awake and alert.  Patient is complaining of diffuse pain throughout her body.  She states she believes that is what happened yesterday she was taking her pain medication and it was not working and she was in a lot of pain.  Mom states the patient often times will need Dilaudid  when she is in this degree of pain.  As the patient is awake and alert and back to baseline we will dose 1 mg of Dilaudid  for the patient.  Given her reassuring workup otherwise with normal/baseline labs reassuring CT and chest x-ray I believe the patient would be safe for discharge home to follow-up with her outpatient doctor.  Mom and patient agreeable with this plan.  Patient has not given a urine sample but denies any urinary symptoms.   Dorothyann Drivers, MD 03/02/24 [phone]

## 2024-03-02 NOTE — ED Notes (Signed)
 Answered call light, family with the pt ask to speak to the doctor with regards to test results, Doctor notified.

## 2024-03-05 ENCOUNTER — Telehealth: Payer: Self-pay

## 2024-03-05 DIAGNOSIS — D57419 Sickle-cell thalassemia with crisis, unspecified: Secondary | ICD-10-CM

## 2024-03-05 NOTE — Progress Notes (Unsigned)
 Complex Care Management Note Care Guide Note  03/05/2024 Name: Bailey Reese MRN: [id number] DOB: [birth year]   Complex Care Management Outreach Attempts: An unsuccessful telephone outreach was attempted today to offer the patient information about available complex care management services.  Follow Up Plan:  Additional outreach attempts will be made to offer the patient complex care management information and services.   Encounter Outcome:  No Answer-Left voicemail  [name] [facility], [facility] Guide  Direct Dial: [phone]  Fax [phone]

## 2024-03-06 LAB — CULTURE, BLOOD (ROUTINE X 2)
Culture: NO GROWTH
Culture: NO GROWTH

## 2024-03-06 NOTE — Progress Notes (Unsigned)
 Complex Care Management Note Care Guide Note  03/06/2024 Name: Bailey Reese MRN: [id number] DOB: Jun 13, 2005   Complex Care Management Outreach Attempts: A second unsuccessful outreach was attempted today to offer the patient with information about available complex care management services.  Follow Up Plan:  Additional outreach attempts will be made to offer the patient complex care management information and services.   Encounter Outcome:  No Answer-Left voicemail  [name] [facility], [facility] Guide  Direct Dial: [phone]  Fax [phone]

## 2024-03-07 NOTE — Progress Notes (Signed)
 Complex Care Management Note  Care Guide Note 03/07/2024 Name: Bailey Reese MRN: [id number] DOB: 11/27/2005  Bailey Reese is a 18 y.o. year old female who sees System, Provider Not In for primary care. I reached out to Bailey Reese by phone today to offer complex care management services.  Ms. Labrum was given information about Complex Care Management services today including:   The Complex Care Management services include support from the care team which includes your Nurse Care Manager, Clinical Social Worker, or Pharmacist.  The Complex Care Management team is here to help remove barriers to the health concerns and goals most important to you. Complex Care Management services are voluntary, and the patient may decline or stop services at any time by request to their care team member.   Complex Care Management Consent Status: Patient agreed to services and verbal consent obtained.   Follow up plan:  Telephone appointment with complex care management team member scheduled for:  03/18/24 @ 9 am  Encounter Outcome:  Patient Scheduled  [name] [facility], [facility] Guide  Direct Dial: [phone]  Fax [phone]

## 2024-03-08 ENCOUNTER — Ambulatory Visit: Payer: Self-pay | Admitting: Nurse Practitioner

## 2024-03-13 ENCOUNTER — Other Ambulatory Visit: Payer: Self-pay

## 2024-03-13 ENCOUNTER — Ambulatory Visit: Payer: Self-pay

## 2024-03-13 DIAGNOSIS — D571 Sickle-cell disease without crisis: Secondary | ICD-10-CM

## 2024-03-13 MED ORDER — OXYCODONE HCL 10 MG PO TABS
10.0000 mg | ORAL_TABLET | Freq: Four times a day (QID) | ORAL | 0 refills | Status: DC | PRN
Start: 2024-03-13 — End: 2024-03-26

## 2024-03-13 NOTE — Telephone Encounter (Signed)
 Please advise [facility]

## 2024-03-13 NOTE — Telephone Encounter (Signed)
 Patient reports generalized body pain from sickle cell. Reports pain level of 8 out of 10-started a few days after oxycodone  rain out (around 10/13). Patient states she has been unable to log into Mychart to request her pain medication. Reviewed with patient how to log in and her password-patient states nothing is working. Did give patient the customer service number for Mychart. Recommended patient to the day hospital-patient refused stating I have a lot of errands to run today. Patient is asking if the office can refill her pain medication. Needing a follow up call.  FYI Only or Action Required?: Action required by provider: clinical question for provider.  Patient was last seen in primary care on 02/09/2024 by Oley Bascom RAMAN, NP.  Called Nurse Triage reporting Generalized Body Pain.  Symptoms began several weeks ago.  Interventions attempted: Rest, hydration, or home remedies.  Symptoms are: unchanged.  Triage Disposition: See HCP Within 4 Hours (Or PCP Triage)  Patient/caregiver understands and will follow disposition?: No, refuses disposition  Copied from CRM [id number]. Topic: Clinical - Red Word Triage >> Mar 13, 2024  8:37 AM Larissa RAMAN wrote: [facility] that prompted transfer to Nurse Triage:  pain all over. Requesting pain medication Reason for Disposition  [1] SEVERE pain (e.g., excruciating, unable to do any normal activities) AND [2] not improved 2 hours after pain medicine  Answer Assessment - Initial Assessment Questions 1. ONSET: When did the muscle aches or body pains start?      Started around 10/13 2. LOCATION: What part of your body is hurting? (e.g., entire body, arms, legs)      Entire body  3. SEVERITY: How bad is the pain? (Scale 1-10; or mild, moderate, severe)     8 out of 10 4. CAUSE: What do you think is causing the pains?     Sickle Cell 5. FEVER: Do you have a fever? If Yes, ask: What is your temperature, how was it measured, and  when did it  start?      no 6. OTHER SYMPTOMS: Do you have any other symptoms? (e.g., chest pain, cold or flu symptoms, rash, weakness, weight loss)     Chest pain 7. PREGNANCY: Is there any chance you are pregnant? When was your last menstrual period?     no 8. TRAVEL: Have you traveled out of the country in the last month? (e.g., exposures, travel history)     no  Protocols used: Muscle Aches and Body Pain-A-AH

## 2024-03-13 NOTE — Telephone Encounter (Signed)
 Was sent to the provider. KH

## 2024-03-18 ENCOUNTER — Other Ambulatory Visit: Payer: Self-pay | Admitting: *Deleted

## 2024-03-18 NOTE — Patient Instructions (Signed)
 [name] - I am sorry I was unable to reach you today for our scheduled appointment. I work with Oley Bascom RAMAN, NP and am calling to support your healthcare needs. Please contact me at [phone] at your earliest convenience. I look forward to speaking with you soon.   Thank you,  Eliyas Suddreth, RN, BSN, ACM RN Care Manager [employer] [phone]

## 2024-03-19 ENCOUNTER — Other Ambulatory Visit: Payer: Self-pay

## 2024-03-19 NOTE — Telephone Encounter (Signed)
 Please advise [facility]

## 2024-03-20 MED ORDER — PREGABALIN 150 MG PO CAPS
150.0000 mg | ORAL_CAPSULE | Freq: Three times a day (TID) | ORAL | 0 refills | Status: AC
Start: 2024-03-20 — End: ?

## 2024-03-22 ENCOUNTER — Telehealth: Payer: Self-pay

## 2024-03-22 NOTE — Progress Notes (Signed)
 Complex Care Management Care Guide Note  03/22/2024 Name: Bailey Reese MRN: [id number] DOB: 19-Jun-2005  [name] is a 18 y.o. year old female who is a primary care patient of Oley Bascom RAMAN, NP and is actively engaged with the care management team. I reached out to [name] by phone today to assist with re-scheduling  with the RN Case Manager.  Follow up plan: Unsuccessful telephone outreach attempt made. A HIPAA compliant phone message was left for the patient providing contact information and requesting a return call.  [name] [facility], [facility] Guide  Direct Dial: [phone]  Fax [phone]

## 2024-03-26 ENCOUNTER — Telehealth: Payer: Self-pay

## 2024-03-26 ENCOUNTER — Other Ambulatory Visit: Payer: Self-pay

## 2024-03-26 DIAGNOSIS — D571 Sickle-cell disease without crisis: Secondary | ICD-10-CM

## 2024-03-26 NOTE — Progress Notes (Signed)
 Complex Care Management Care Guide Note  03/26/2024 Name: LINDEY RENZULLI MRN: [id number] DOB: 2005/12/07  [name] is a 18 y.o. year old female who is a primary care patient of Oley Bascom RAMAN, NP and is actively engaged with the care management team. I reached out to [name] by phone today to assist with re-scheduling  with the RN Case Manager.  Follow up plan: Unsuccessful telephone outreach attempt made. A HIPAA compliant phone message was left for the patient providing contact information and requesting a return call.  [name] [facility], [facility] Guide  Direct Dial: [phone]  Fax [phone]

## 2024-03-26 NOTE — Telephone Encounter (Signed)
 Please advise [facility]

## 2024-03-27 MED ORDER — OXYCODONE HCL 10 MG PO TABS: 10.0000 mg | ORAL_TABLET | Freq: Four times a day (QID) | ORAL | 0 refills | Status: DC | PRN

## 2024-04-09 ENCOUNTER — Telehealth: Payer: Self-pay

## 2024-04-09 ENCOUNTER — Other Ambulatory Visit: Payer: Self-pay

## 2024-04-09 DIAGNOSIS — D571 Sickle-cell disease without crisis: Secondary | ICD-10-CM

## 2024-04-09 NOTE — Telephone Encounter (Signed)
 Please advise [facility]

## 2024-04-09 NOTE — Telephone Encounter (Signed)
 Copied from CRM #[id number]. Topic: Appointments - Scheduling Inquiry for Clinic >> Apr 08, 2024  3:31 PM Joesph B wrote: Reason for CRM: patient is calling to schedule her depo shot, please call pt to schedule. [phone]  Pt will need to be seen. KH

## 2024-04-13 MED ORDER — OXYCODONE HCL 10 MG PO TABS: 10.0000 mg | ORAL_TABLET | Freq: Four times a day (QID) | ORAL | 0 refills | Status: DC | PRN

## 2024-04-16 ENCOUNTER — Encounter (HOSPITAL_COMMUNITY): Payer: Self-pay | Admitting: Internal Medicine

## 2024-04-16 ENCOUNTER — Emergency Department

## 2024-04-16 ENCOUNTER — Emergency Department
Admission: EM | Admit: 2024-04-16 | Discharge: 2024-04-16 | Disposition: A | Discharge status: Other Acute Inpatient Hospital | Attending: Emergency Medicine | Admitting: Emergency Medicine

## 2024-04-16 ENCOUNTER — Inpatient Hospital Stay (HOSPITAL_COMMUNITY)
Admit: 2024-04-16 | Discharge: 2024-04-22 | DRG: 811 | Disposition: A | Source: Other Acute Inpatient Hospital | Attending: Student | Admitting: Student

## 2024-04-16 ENCOUNTER — Encounter (HOSPITAL_COMMUNITY): Payer: Self-pay

## 2024-04-16 ENCOUNTER — Other Ambulatory Visit: Payer: Self-pay

## 2024-04-16 DIAGNOSIS — D5700 Hb-SS disease with crisis, unspecified: Secondary | ICD-10-CM | POA: Diagnosis present

## 2024-04-16 DIAGNOSIS — G894 Chronic pain syndrome: Secondary | ICD-10-CM | POA: Diagnosis present

## 2024-04-16 DIAGNOSIS — I959 Hypotension, unspecified: Secondary | ICD-10-CM | POA: Diagnosis not present

## 2024-04-16 DIAGNOSIS — D638 Anemia in other chronic diseases classified elsewhere: Secondary | ICD-10-CM | POA: Diagnosis present

## 2024-04-16 DIAGNOSIS — J9601 Acute respiratory failure with hypoxia: Secondary | ICD-10-CM | POA: Diagnosis not present

## 2024-04-16 DIAGNOSIS — D57219 Sickle-cell/Hb-C disease with crisis, unspecified: Secondary | ICD-10-CM | POA: Diagnosis not present

## 2024-04-16 DIAGNOSIS — J101 Influenza due to other identified influenza virus with other respiratory manifestations: Secondary | ICD-10-CM | POA: Diagnosis present

## 2024-04-16 DIAGNOSIS — Z743 Need for continuous supervision: Secondary | ICD-10-CM | POA: Diagnosis not present

## 2024-04-16 DIAGNOSIS — D72829 Elevated white blood cell count, unspecified: Secondary | ICD-10-CM | POA: Diagnosis not present

## 2024-04-16 DIAGNOSIS — F411 Generalized anxiety disorder: Secondary | ICD-10-CM | POA: Diagnosis present

## 2024-04-16 DIAGNOSIS — J45909 Unspecified asthma, uncomplicated: Secondary | ICD-10-CM | POA: Diagnosis present

## 2024-04-16 LAB — CBC WITH DIFFERENTIAL/PLATELET
Abs Immature Granulocytes: 0.12 K/uL — ABNORMAL HIGH (ref 0.00–0.07)
Basophils Absolute: 0.2 K/uL — ABNORMAL HIGH (ref 0.0–0.1)
Basophils Relative: 1 %
Eosinophils Absolute: 0 K/uL (ref 0.0–0.5)
Eosinophils Relative: 0 %
HCT: 18.7 % — ABNORMAL LOW (ref 36.0–46.0)
Hemoglobin: 6.9 g/dL — ABNORMAL LOW (ref 12.0–15.0)
Immature Granulocytes: 1 %
Lymphocytes Relative: 10 %
Lymphs Abs: 1.7 K/uL (ref 0.7–4.0)
MCH: 33.3 pg (ref 26.0–34.0)
MCHC: 36.9 g/dL — ABNORMAL HIGH (ref 30.0–36.0)
MCV: 90.3 fL (ref 80.0–100.0)
Monocytes Absolute: 3 K/uL — ABNORMAL HIGH (ref 0.1–1.0)
Monocytes Relative: 17 %
Neutro Abs: 12.2 K/uL — ABNORMAL HIGH (ref 1.7–7.7)
Neutrophils Relative %: 71 %
Platelets: 406 K/uL — ABNORMAL HIGH (ref 150–400)
RBC: 2.07 MIL/uL — ABNORMAL LOW (ref 3.87–5.11)
RDW: 20.6 % — ABNORMAL HIGH (ref 11.5–15.5)
WBC: 17.3 K/uL — ABNORMAL HIGH (ref 4.0–10.5)
nRBC: 1.4 % — ABNORMAL HIGH (ref 0.0–0.2)

## 2024-04-16 LAB — PREPARE RBC (CROSSMATCH)

## 2024-04-16 LAB — RETICULOCYTES
Immature Retic Fract: 27 % — ABNORMAL HIGH (ref 2.3–15.9)
RBC.: 2.15 MIL/uL — ABNORMAL LOW (ref 3.87–5.11)
Retic Count, Absolute: 459.5 K/uL — ABNORMAL HIGH (ref 19.0–186.0)
Retic Ct Pct: 21.4 % — ABNORMAL HIGH (ref 0.4–3.1)

## 2024-04-16 LAB — COMPREHENSIVE METABOLIC PANEL WITH GFR
ALT: 84 U/L — ABNORMAL HIGH (ref 0–44)
AST: 91 U/L — ABNORMAL HIGH (ref 15–41)
Albumin: 4.7 g/dL (ref 3.5–5.0)
Alkaline Phosphatase: 149 U/L — ABNORMAL HIGH (ref 38–126)
Anion gap: 13 (ref 5–15)
BUN: 10 mg/dL (ref 6–20)
CO2: 22 mmol/L (ref 22–32)
Calcium: 9.5 mg/dL (ref 8.9–10.3)
Chloride: 102 mmol/L (ref 98–111)
Creatinine, Ser: 0.59 mg/dL (ref 0.44–1.00)
GFR, Estimated: >60 mL/min (ref >60)
Glucose, Bld: 151 mg/dL — ABNORMAL HIGH (ref 70–99)
Potassium: 3.7 mmol/L (ref 3.5–5.1)
Sodium: 136 mmol/L (ref 135–145)
Total Bilirubin: 4.3 mg/dL — ABNORMAL HIGH (ref 0.0–1.2)
Total Protein: 8.7 g/dL — ABNORMAL HIGH (ref 6.5–8.1)

## 2024-04-16 LAB — RESP PANEL BY RT-PCR (RSV, FLU A&B, COVID)  RVPGX2
Influenza A by PCR: POSITIVE — AB
Influenza B by PCR: NEGATIVE
Resp Syncytial Virus by PCR: NEGATIVE
SARS Coronavirus 2 by RT PCR: NEGATIVE

## 2024-04-16 MED ORDER — OXYCODONE HCL 5 MG PO TABS
10.0000 mg | ORAL_TABLET | Freq: Four times a day (QID) | ORAL | Status: DC | PRN
  Administered 2024-04-17 – 2024-04-22 (×14): 10 mg via ORAL
  Filled 2024-04-16 (×15): qty 2

## 2024-04-16 MED ORDER — FLUTICASONE FUROATE-VILANTEROL 100-25 MCG/ACT IN AEPB: 1.0000 | INHALATION_SPRAY | Freq: Every day | RESPIRATORY_TRACT | Status: DC

## 2024-04-16 MED ORDER — HYDROMORPHONE HCL 1 MG/ML IJ SOLN
1.0000 mg | Freq: Once | INTRAMUSCULAR | Status: AC
  Administered 2024-04-16: 1 mg via INTRAVENOUS
  Filled 2024-04-16: qty 1

## 2024-04-16 MED ORDER — FLUTICASONE FUROATE-VILANTEROL 100-25 MCG/ACT IN AEPB
1.0000 | INHALATION_SPRAY | Freq: Every day | RESPIRATORY_TRACT | Status: DC
  Administered 2024-04-17 – 2024-04-21 (×5): 1 via RESPIRATORY_TRACT
  Filled 2024-04-16: qty 28

## 2024-04-16 MED ORDER — DM-GUAIFENESIN ER 30-600 MG PO TB12
1.0000 | ORAL_TABLET | Freq: Two times a day (BID) | ORAL | Status: DC
  Administered 2024-04-16 – 2024-04-21 (×11): 1 via ORAL
  Filled 2024-04-16 (×11): qty 1

## 2024-04-16 MED ORDER — POLYETHYLENE GLYCOL 3350 17 G PO PACK: 17.0000 g | PACK | Freq: Every day | ORAL | Status: DC | PRN

## 2024-04-16 MED ORDER — SODIUM CHLORIDE 0.9% FLUSH: 9.0000 mL | INTRAVENOUS | Status: DC | PRN

## 2024-04-16 MED ORDER — ONDANSETRON HCL 4 MG/2ML IJ SOLN
4.0000 mg | Freq: Four times a day (QID) | INTRAMUSCULAR | Status: DC | PRN
  Administered 2024-04-18 – 2024-04-22 (×11): 4 mg via INTRAVENOUS
  Filled 2024-04-16 (×11): qty 2

## 2024-04-16 MED ORDER — SENNOSIDES-DOCUSATE SODIUM 8.6-50 MG PO TABS
1.0000 | ORAL_TABLET | Freq: Two times a day (BID) | ORAL | Status: DC
  Administered 2024-04-20 – 2024-04-21 (×4): 1 via ORAL
  Filled 2024-04-16 (×9): qty 1

## 2024-04-16 MED ORDER — SODIUM CHLORIDE 0.45 % IV SOLN: INTRAVENOUS | Status: AC

## 2024-04-16 MED ORDER — HYDROXYUREA 500 MG PO CAPS
1500.0000 mg | ORAL_CAPSULE | Freq: Every day | ORAL | Status: DC
  Administered 2024-04-17 – 2024-04-21 (×5): 1500 mg via ORAL
  Filled 2024-04-16 (×5): qty 3

## 2024-04-16 MED ORDER — DIPHENHYDRAMINE HCL 50 MG/ML IJ SOLN
25.0000 mg | Freq: Once | INTRAMUSCULAR | Status: AC
  Administered 2024-04-16: 25 mg via INTRAVENOUS
  Filled 2024-04-16: qty 1

## 2024-04-16 MED ORDER — NALOXONE HCL 0.4 MG/ML IJ SOLN: 0.4000 mg | INTRAMUSCULAR | Status: DC | PRN

## 2024-04-16 MED ORDER — SODIUM CHLORIDE 0.9 % IV SOLN: Freq: Once | INTRAVENOUS | Status: AC

## 2024-04-16 MED ORDER — BISACODYL 5 MG PO TBEC: 5.0000 mg | DELAYED_RELEASE_TABLET | Freq: Every day | ORAL | Status: DC | PRN

## 2024-04-16 MED ORDER — SODIUM CHLORIDE 0.9% IV SOLUTION: Freq: Once | INTRAVENOUS | Status: AC

## 2024-04-16 MED ORDER — ENOXAPARIN SODIUM 40 MG/0.4ML IJ SOSY: 40.0000 mg | PREFILLED_SYRINGE | INTRAMUSCULAR | Status: DC

## 2024-04-16 MED ORDER — PANTOPRAZOLE SODIUM 40 MG PO TBEC
40.0000 mg | DELAYED_RELEASE_TABLET | Freq: Every day | ORAL | Status: DC
  Administered 2024-04-17 – 2024-04-21 (×5): 40 mg via ORAL
  Filled 2024-04-16 (×5): qty 1

## 2024-04-16 MED ORDER — ACETAMINOPHEN 325 MG PO TABS
650.0000 mg | ORAL_TABLET | Freq: Four times a day (QID) | ORAL | Status: DC | PRN
  Administered 2024-04-16: 650 mg via ORAL
  Filled 2024-04-16: qty 2

## 2024-04-16 MED ORDER — LUBIPROSTONE 24 MCG PO CAPS
24.0000 ug | ORAL_CAPSULE | Freq: Two times a day (BID) | ORAL | Status: DC
  Administered 2024-04-17 – 2024-04-21 (×10): 24 ug via ORAL
  Filled 2024-04-16 (×11): qty 1

## 2024-04-16 MED ORDER — HYDROMORPHONE HCL 1 MG/ML IJ SOLN
1.0000 mg | Freq: Once | INTRAMUSCULAR | Status: AC | PRN
  Administered 2024-04-16: 1 mg via INTRAVENOUS
  Filled 2024-04-16: qty 1

## 2024-04-16 MED ORDER — SCOPOLAMINE 1 MG/3DAYS TD PT72
1.0000 | MEDICATED_PATCH | TRANSDERMAL | Status: DC
  Administered 2024-04-19 (×2): 1 mg via TRANSDERMAL
  Filled 2024-04-16 (×2): qty 1

## 2024-04-16 MED ORDER — HYDROMORPHONE HCL 1 MG/ML IJ SOLN
1.0000 mg | Freq: Once | INTRAMUSCULAR | Status: AC
  Administered 2024-04-16: 1 mg via INTRAVENOUS

## 2024-04-16 MED ORDER — OSELTAMIVIR PHOSPHATE 75 MG PO CAPS
75.0000 mg | ORAL_CAPSULE | Freq: Once | ORAL | Status: AC
  Administered 2024-04-16: 75 mg via ORAL
  Filled 2024-04-16: qty 1

## 2024-04-16 MED ORDER — ONDANSETRON HCL 4 MG/2ML IJ SOLN
4.0000 mg | Freq: Once | INTRAMUSCULAR | Status: AC
  Administered 2024-04-16: 4 mg via INTRAVENOUS
  Filled 2024-04-16: qty 2

## 2024-04-16 MED ORDER — HYDROMORPHONE 1 MG/ML IV SOLN
INTRAVENOUS | Status: DC
  Administered 2024-04-16: 3.5 mg via INTRAVENOUS
  Administered 2024-04-16: 30 mg via INTRAVENOUS
  Administered 2024-04-16: 6 mg via INTRAVENOUS
  Administered 2024-04-17: 11.5 mg via INTRAVENOUS
  Administered 2024-04-17: 3 mg via INTRAVENOUS
  Administered 2024-04-17: 8.5 mg via INTRAVENOUS
  Administered 2024-04-17: 30 mg via INTRAVENOUS
  Administered 2024-04-17: 10.5 mg via INTRAVENOUS
  Administered 2024-04-18: 30 mg via INTRAVENOUS
  Administered 2024-04-18: 4 mg via INTRAVENOUS
  Administered 2024-04-18: 10.5 mg via INTRAVENOUS
  Administered 2024-04-18: 30 mg via INTRAVENOUS
  Administered 2024-04-18: 10 mg via INTRAVENOUS
  Administered 2024-04-18: 9.5 mg via INTRAVENOUS
  Administered 2024-04-19: 30 mg via INTRAVENOUS
  Administered 2024-04-19: 11 mg via INTRAVENOUS
  Administered 2024-04-19: 11.5 mg via INTRAVENOUS
  Administered 2024-04-19 (×2): 3.5 mg via INTRAVENOUS
  Administered 2024-04-19: 30 mg via INTRAVENOUS
  Administered 2024-04-19: 7 mg via INTRAVENOUS
  Administered 2024-04-19: 7.5 mg via INTRAVENOUS
  Administered 2024-04-19: 4.5 mg via INTRAVENOUS
  Administered 2024-04-20: 30 mg via INTRAVENOUS
  Administered 2024-04-20: 2 mg via INTRAVENOUS
  Administered 2024-04-20: 8.5 mg via INTRAVENOUS
  Administered 2024-04-20: 7 mg via INTRAVENOUS
  Administered 2024-04-20: 14 mg via INTRAVENOUS
  Administered 2024-04-20: 12 mg via INTRAVENOUS
  Administered 2024-04-21: 30 mg via INTRAVENOUS
  Administered 2024-04-21: 5 mg via INTRAVENOUS
  Administered 2024-04-21: 30 mg via INTRAVENOUS
  Administered 2024-04-21 (×2): 14 mg via INTRAVENOUS
  Administered 2024-04-22: 30 mg via INTRAVENOUS
  Filled 2024-04-16 (×10): qty 30

## 2024-04-16 MED ORDER — HYDROMORPHONE HCL 1 MG/ML IJ SOLN
1.0000 mg | Freq: Once | INTRAMUSCULAR | Status: DC
  Filled 2024-04-16: qty 1

## 2024-04-16 MED ORDER — KETOROLAC TROMETHAMINE 30 MG/ML IJ SOLN
30.0000 mg | Freq: Once | INTRAMUSCULAR | Status: AC
  Administered 2024-04-16: 30 mg via INTRAVENOUS
  Filled 2024-04-16: qty 1

## 2024-04-16 MED ORDER — DIPHENHYDRAMINE HCL 25 MG PO CAPS
25.0000 mg | ORAL_CAPSULE | ORAL | Status: DC | PRN
  Administered 2024-04-16 – 2024-04-21 (×3): 25 mg via ORAL
  Filled 2024-04-16 (×4): qty 1

## 2024-04-16 MED ORDER — PREGABALIN 75 MG PO CAPS
150.0000 mg | ORAL_CAPSULE | Freq: Three times a day (TID) | ORAL | Status: DC
  Administered 2024-04-16 – 2024-04-21 (×16): 150 mg via ORAL
  Filled 2024-04-16 (×16): qty 2

## 2024-04-16 NOTE — ED Notes (Signed)
 Pt placed on oxygen 2lpm via [place] due to SpO2 89-91% on RA

## 2024-04-16 NOTE — ED Notes (Signed)
 Pt been accepted Bailey Reese called back from [employer]

## 2024-04-16 NOTE — H&P (Addendum)
 H&P  Patient Demographics:  Bailey Reese, is a 18 y.o. female  MRN: [id number]   DOB - Jul 24, 2005  Admit Date - 04/16/2024  Outpatient Primary MD for the patient is Bailey Bascom RAMAN, NP  Chief Complaint  Patient presents with   Sickle Cell Pain Crisis      HPI:   Bailey Reese  is a 18 y.o. female with medical history significant of sickle cell disease, chronic pain, history of hemorrhagic stroke in 2017, asthma, PTSD, depression, anxiety, GERD presenting from [facility] ED due to concern for sickle cell pain crisis.  Evaluation of URI symptoms and pain all over. Patient reports that on Saturday, she started having sore throat, congestion and chest tightness.  The next day, she started having muscle pain all over which worsened on Monday. She took her home oxycodone  without significant relief so she presented to the ED for further evaluation. She also reports associated fevers, chills, productive cough, nausea and vomiting but no dizziness, headache, chest pain, abdominal pain or dysuria. Reports they found out one of her cousins had the flu and was around multiple family members including patient during thanksgiving.    ED course: BP 122/64, Resp 18, Temp 99.3, SP02 100%, Pulse 107, WBC 22.8, Hgb 7.7, Platelet 409, patient presented with pain, cough and fever to the ED, Patient was treated with IVF, IV dilaudid  with no resolution to pain symptoms. Patient is admitted for further evaluation of URI and pain management.     Review of systems:  In addition to the HPI above, patient reports  fever No Headache, No changes with vision or hearing No problems swallowing food or liquids No chest pain, cough or shortness of breath No abdominal pain, No nausea or vomiting, Bowel movements are regular No blood in stool or urine No dysuria No new skin rashes or bruises No new joints pains-aches No new weakness, tingling, numbness in any extremity No recent weight gain or loss No polyuria,  polydypsia or polyphagia No significant Mental Stressors  A full 10 point Review of Systems was done, except as stated above, all other Review of Systems were negative.  With Past History of the following :   Past Medical History:  Diagnosis Date   Anxiety    Depression    PTSD (post-traumatic stress disorder)    Sickle cell anemia (HCC)       Past Surgical History:  Procedure Laterality Date   BIOPSY  07/04/2023   Procedure: BIOPSY;  Surgeon: Saintclair Jasper, MD;  Location: [facility];  Service: Gastroenterology;;   CHOLECYSTECTOMY     ESOPHAGOGASTRODUODENOSCOPY (EGD) WITH PROPOFOL  N/A 07/04/2023   Procedure: ESOPHAGOGASTRODUODENOSCOPY (EGD) WITH PROPOFOL ;  Surgeon: Saintclair Jasper, MD;  Location: [facility];  Service: Gastroenterology;  Laterality: N/A;   WRIST SURGERY       Social History:   Social History   Tobacco Use   Smoking status: Never   Smokeless tobacco: Never  Substance Use Topics   Alcohol  use: No     Lives - At home   Family History :   Family History  Problem Relation Age of Onset   Diabetes Paternal Aunt      Home Medications:   Prior to Admission medications   Medication Sig Start Date End Date Taking? Authorizing Provider  budesonide-formoterol  (SYMBICORT) 80-4.5 MCG/ACT inhaler Inhale 2 puffs into the lungs in the morning and at bedtime. 12/29/22 02/20/24  [provider]  Calcium Carb-Cholecalciferol (CALCIUM 600+D3 PO) Take 2 tablets by mouth daily.  [provider]  ergocalciferol  (VITAMIN D2) 1.25 MG (50000 UT) capsule Take 1 capsule (50,000 Units total) by mouth once a week. Patient taking differently: Take 50,000 Units by mouth every Wednesday. 02/12/24   Bailey Bascom RAMAN, NP  esomeprazole  (NEXIUM ) 40 MG capsule Take 1 capsule (40 mg total) by mouth daily as needed (reflux). 08/29/23   Tanzie Rothschild M, NP  fexofenadine (ALLEGRA) 180 MG tablet Take 180 mg by mouth in the morning. 12/29/22   [provider]   hydroxyurea  (HYDREA ) 500 MG capsule Take 1,500 mg by mouth daily. May take with food to minimize GI side effects.    [provider]  hydrOXYzine  (ATARAX ) 25 MG tablet Take 25 mg by mouth See admin instructions. Take 25 mg by mouth twice a day and an additional 25 mg once a day as needed for anxiety    [provider]  lubiprostone  (AMITIZA ) 24 MCG capsule Take 24 mcg by mouth in the morning and at bedtime. 11/12/23   [provider]  ondansetron  (ZOFRAN ) 4 MG tablet Take 4 mg by mouth See admin instructions. Take 4 mg by mouth up to 4 times a day 12/29/23   [provider]  Oxycodone  HCl 10 MG TABS Take 1 tablet (10 mg total) by mouth every 6 (six) hours as needed (pain). 04/13/24   Bailey Bascom RAMAN, NP  pantoprazole  (PROTONIX ) 40 MG tablet Take 1 tablet (40 mg total) by mouth 2 (two) times daily. Patient taking differently: Take 40 mg by mouth daily as needed (reflux). 07/05/23 07/04/24  Sim Emery CROME, MD  prazosin  (MINIPRESS ) 1 MG capsule Take 1 mg by mouth at bedtime.    [provider]  pregabalin  (LYRICA ) 150 MG capsule Take 1 capsule (150 mg total) by mouth 3 (three) times daily. 03/20/24   Nichols, Tonya S, NP  scopolamine (TRANSDERM-SCOP) 1 MG/3DAYS Place 1 patch onto the skin every 3 (three) days. 01/03/24   [provider]     Allergies:   Allergies  Allergen Reactions   Cherry Anaphylaxis, Itching and Swelling   Droperidol  Anxiety and Other (See Comments)    Droperidol  is a medication that prevents nausea and vomiting caused by surgical procedures. The brand name of this medication is Inapsine . = Pt states muscle tension, involuntary movements, eye drifting, and SEIZURES   Olanzapine  Other (See Comments)    Drug-induced liver injury   Other Anaphylaxis, Itching, Swelling and Other (See Comments)    Pitted fruits - Throat itches  ALSO- THE PATIENT IS PRONE TO HAVE SEIZURES.   Peanut-Containing Drug Products Anaphylaxis,  Itching and Swelling   Plum Pulp Anaphylaxis and Hives   Morphine  And Codeine Hives and Itching   Peach [Prunus Persica] Itching, Swelling and Other (See Comments)   Acetaminophen  Nausea And Vomiting   Compazine  [Prochlorperazine ] Other (See Comments) and Hypertension    Patient stated that this medication causes her HR to increase and well as BP. She also states that it cause hallucinations.   Ibuprofen  Nausea And Vomiting     Physical Exam:   Vitals:   Vitals:   04/17/24 1050 04/17/24 1423  BP: 115/67 111/61  Pulse: 90 (!) 106  Resp: 16 16  Temp: 98.5 F (36.9 C) 99.8 F (37.7 C)  SpO2:      Physical Exam: Constitutional: Patient appears well-developed and well-nourished. Not in obvious distress. HENT: Normocephalic, atraumatic, External right and left ear normal. Oropharynx is clear and moist.  Eyes: Conjunctivae and EOM are normal. PERRLA,  no scleral icterus. Neck: Normal ROM. Neck supple. No JVD. No tracheal deviation. No thyromegaly. CVS: RRR, S1/S2 +, no murmurs, no gallops, no carotid bruit.  Pulmonary:Cough Abdominal: Soft. BS +, no distension, tenderness, rebound or guarding.  Musculoskeletal: Normal range of motion. No edema and no tenderness.  Lymphadenopathy: No lymphadenopathy noted, cervical, inguinal or axillary Neuro: Alert. Normal reflexes, muscle tone coordination. No cranial nerve deficit. Skin: Skin is warm and dry. No rash noted. Not diaphoretic. No erythema. No pallor. Psychiatric: Normal mood and affect. Behavior, judgment, thought content normal.   Data Review:   CBC Recent Labs  Lab 04/16/24 1319 04/17/24 0509 04/18/24 0617  WBC 17.3* 22.8* 16.6*  HGB 6.9* 7.7* 7.0*  HCT 18.7* 21.8* 20.3*  PLT 406* 409* 401*  MCV 90.3 90.5 89.8  MCH 33.3 32.0 31.0  MCHC 36.9* 35.3 34.5  RDW 20.6* 19.8* 20.3*  LYMPHSABS 1.7  --   --   MONOABS 3.0*  --   --   EOSABS 0.0  --   --   BASOSABS 0.2*  --   --     ------------------------------------------------------------------------------------------------------------------  Chemistries  Recent Labs  Lab 04/16/24 1319 04/17/24 0509  NA 136 134*  K 3.7 3.8  CL 102 103  CO2 22 20*  GLUCOSE 151* 85  BUN 10 7  CREATININE 0.59 0.51  CALCIUM 9.5 8.6*  AST 91*  --   ALT 84*  --   ALKPHOS 149*  --   BILITOT 4.3*  --    ------------------------------------------------------------------------------------------------------------------ estimated creatinine clearance is 110.3 mL/min (by C-G formula based on SCr of 0.51 mg/dL). ------------------------------------------------------------------------------------------------------------------ No results for input(s): TSH, T4TOTAL, T3FREE, THYROIDAB in the last 72 hours.  Invalid input(s): FREET3  Coagulation profile No results for input(s): INR, PROTIME in the last 168 hours. ------------------------------------------------------------------------------------------------------------------- No results for input(s): DDIMER in the last 72 hours. -------------------------------------------------------------------------------------------------------------------  Cardiac Enzymes No results for input(s): CKMB, TROPONINI, MYOGLOBIN in the last 168 hours.  Invalid input(s): CK ------------------------------------------------------------------------------------------------------------------    Component Value Date/Time   BNP 22.4 12/01/2023 2047    ---------------------------------------------------------------------------------------------------------------  Urinalysis    Component Value Date/Time   COLORURINE YELLOW 02/21/2024 1130   APPEARANCEUR CLEAR 02/21/2024 1130   APPEARANCEUR Clear 05/01/2013 1440   LABSPEC 1.010 02/21/2024 1130   LABSPEC 1.016 05/01/2013 1440   PHURINE 6.0 02/21/2024 1130   GLUCOSEU NEGATIVE 02/21/2024 1130   GLUCOSEU Negative 05/01/2013  1440   HGBUR NEGATIVE 02/21/2024 1130   BILIRUBINUR NEGATIVE 02/21/2024 1130   BILIRUBINUR Negative 05/01/2013 1440   KETONESUR NEGATIVE 02/21/2024 1130   PROTEINUR NEGATIVE 02/21/2024 1130   NITRITE NEGATIVE 02/21/2024 1130   LEUKOCYTESUR NEGATIVE 02/21/2024 1130   LEUKOCYTESUR Negative 05/01/2013 1440    ----------------------------------------------------------------------------------------------------------------   Imaging Results:    DG Chest 2 View Result Date: 04/16/2024 CLINICAL DATA:  Cough.  Sickle cell crisis with pain and vomiting. EXAM: CHEST - 2 VIEW COMPARISON:  03/01/2024 FINDINGS: Patient is slightly rotated to the right. Lungs are adequately inflated. No airspace consolidation or effusion. Cardiomediastinal silhouette and remainder of the exam is unchanged. IMPRESSION: No active cardiopulmonary disease. Electronically Signed   By: Toribio Agreste M.D.   On: 04/16/2024 13:02     Assessment & Plan:  Active Problems:   Chronic pain syndrome   Leukocytosis   GAD (generalized anxiety disorder)   Anemia of chronic disease   Asthma, chronic   Influenza A   Hb Sickle Cell Disease with crisis: Admit patient, start IVF 0.45% Saline @ 125 mls/hour, start  weight based Dilaudid  PCA, Restart oral home pain medications, Monitor vitals very closely, Re-evaluate pain scale regularly, 2 L of Oxygen by [place], Patient will be re-evaluated for pain in the context of function and relationship to baseline as care progresses. Leukocytosis: Elevated, patient is positive for Flu.  Anemia of Chronic Disease: Hgb currently at baseline no clinical indication for transfusion.  Chronic pain Syndrome: continue oral home pain medication  Influenza A infection: Currently out of the window for Tamiflu, symptomatic care, Mucinex , IV hydration and tylenol  as needed.  GAD ( generalized anxiety disorder): stable, continue medication/follow up as scheduled.   Asthma, chronic: Stable, no acute exacerbation     DVT Prophylaxis: Subcut Lovenox    AM Labs Ordered, also please review Full Orders  Family Communication: Admission, patient's condition and plan of care including tests being ordered have been discussed with the patient who indicate understanding and agree with the plan and Code Status.  Code Status: Full Code  Consults called: None    Admission status: Inpatient    Time spent in minutes : 50 minutes  [name] Cover NP  04/16/2024 at 10:17 AM

## 2024-04-16 NOTE — ED Triage Notes (Addendum)
 Pt arrived to ED lobby via EMS from home with c/o sickle cell crisis with pain and vomiting. EMS administered 75 mcg of fentanyl . Pt appears calm and cooperative upon arrival.

## 2024-04-16 NOTE — Progress Notes (Signed)
 IVT consult for 2 PIV access. Assessed Forearms; pt with poor vasculature; new PIV placed earlier on RU arm infiltrated; LL Fa assessed, no suitable veins to access; Able to place PIV on LUA with g 20 2.5 IV catheter with GBR. Informed RN.

## 2024-04-16 NOTE — Progress Notes (Signed)
 See new orders

## 2024-04-16 NOTE — ED Provider Notes (Signed)
 [facility] Provider Note    Event Date/Time   First MD Initiated Contact with Patient 04/16/24 1230     (approximate)   History   Sickle Cell Pain Crisis   HPI  Bailey Reese is a 18 y.o. female with a history of sickle cell anemia, hemorrhagic stroke, anxiety who presents with complaints of pain all over.  Patient reports she has been sick with upper respiratory infection the last several days has had some nausea as well.  Denies abdominal pain.  She reports today she started having aching all over her body.  She does deny headache however.  No neurodeficits.     Physical Exam   Triage Vital Signs: ED Triage Vitals  Encounter Vitals Group     BP 04/16/24 1207 122/64     Girls Systolic BP Percentile --      Girls Diastolic BP Percentile --      Boys Systolic BP Percentile --      Boys Diastolic BP Percentile --      Pulse Rate 04/16/24 1207 (!) 107     Resp 04/16/24 1207 18     Temp 04/16/24 1207 99.3 F (37.4 C)     Temp Source 04/16/24 1207 Oral     SpO2 04/16/24 1159 100 %     Weight 04/16/24 1208 70.3 kg (155 lb)     Height 04/16/24 1208 1.651 m (5' 5)     Head Circumference --      Peak Flow --      Pain Score 04/16/24 1216 10     Pain Loc --      Pain Education --      Exclude from Growth Chart --     Most recent vital signs: Vitals:   04/16/24 1159 04/16/24 1207  BP:  122/64  Pulse:  (!) 107  Resp:  18  Temp:  99.3 F (37.4 C)  SpO2: 100% 91%     General: Awake, no distress.  CV:  Good peripheral perfusion.  Mild tachycardia Resp:  Normal effort.  Clear to auscultation bilaterally Abd:  No distention.  Soft, nontender Other:     ED Results / Procedures / Treatments   Labs (all labs ordered are listed, but only abnormal results are displayed) Labs Reviewed  COMPREHENSIVE METABOLIC PANEL WITH GFR - Abnormal; Notable for the following components:      Result Value   Glucose, Bld 151 (*)    Total Protein 8.7  (*)    AST 91 (*)    ALT 84 (*)    Alkaline Phosphatase 149 (*)    Total Bilirubin 4.3 (*)    All other components within normal limits  CBC WITH DIFFERENTIAL/PLATELET - Abnormal; Notable for the following components:   WBC 17.3 (*)    RBC 2.07 (*)    Hemoglobin 6.9 (*)    HCT 18.7 (*)    MCHC 36.9 (*)    RDW 20.6 (*)    Platelets 406 (*)    nRBC 1.4 (*)    Neutro Abs 12.2 (*)    Monocytes Absolute 3.0 (*)    Basophils Absolute 0.2 (*)    Abs Immature Granulocytes 0.12 (*)    All other components within normal limits  RETICULOCYTES - Abnormal; Notable for the following components:   Retic Ct Pct 21.4 (*)    RBC. 2.15 (*)    Retic Count, Absolute 459.5 (*)    Immature Retic Fract 27.0 (*)  All other components within normal limits  RESP PANEL BY RT-PCR (RSV, FLU A&B, COVID)  [id number]  POC URINE PREG, ED     EKG  ED ECG REPORT I, Lamar Price, the attending physician, personally viewed and interpreted this ECG.  Date: 04/16/2024  Rhythm: normal sinus rhythm QRS Axis: normal Intervals: normal ST/T Wave abnormalities: normal Narrative Interpretation: no evidence of acute ischemia    RADIOLOGY Chest x-ray viewed interpret by me, no acute abnormality    PROCEDURES:  Critical Care performed:   Procedures   MEDICATIONS ORDERED IN ED: Medications  0.9 %  sodium chloride  infusion ( Intravenous New Bag/Given 04/16/24 1339)  HYDROmorphone  (DILAUDID ) injection 1 mg (1 mg Intravenous Given 04/16/24 1327)  ondansetron  (ZOFRAN ) injection 4 mg (4 mg Intravenous Given 04/16/24 1422)  HYDROmorphone  (DILAUDID ) injection 1 mg (1 mg Intravenous Given 04/16/24 1420)  ketorolac  (TORADOL ) 30 MG/ML injection 30 mg (30 mg Intravenous Given 04/16/24 1428)  diphenhydrAMINE  (BENADRYL ) injection 25 mg (25 mg Intravenous Given 04/16/24 1426)     IMPRESSION / MDM / ASSESSMENT AND PLAN / ED COURSE  I reviewed the triage vital signs and the nursing notes. Patient's presentation is most  consistent with severe exacerbation of chronic illness.  Patient presents with pain all over as detailed above, suspicious for pain related to sickle cell disease.  Her temperature is mildly elevated and she has mild tachycardia as well.  Suspect this is related to viral upper respiratory infection.  Will treat with IV fluids, IV pain medication, IV nausea medication  Lab work notable for elevated white blood cell count, hemoglobin of 6.9  Patient has required multiple doses of pain medication, I have contacted [facility] for admission or the patient's hematology team is, they have excepted      FINAL CLINICAL IMPRESSION(S) / ED DIAGNOSES   Final diagnoses:  Sickle cell pain crisis (HCC)     Rx / DC Orders   ED Discharge Orders     None        Note:  This document was prepared using Dragon voice recognition software and may include unintentional dictation errors.   Price Lamar, MD 04/16/24 1515

## 2024-04-16 NOTE — ED Notes (Signed)
 [facility] called spoke with Zachary regarding transferring pt to [facility] per Dr Arlander for sickle cell crisis

## 2024-04-16 NOTE — H&P (Addendum)
 History and Physical  KEYONTA MADRID FMW:[id number] DOB: 04/07/2006 DOA: 04/16/2024  PCP: Oley Bascom RAMAN, NP   Chief Complaint: Sickle cell pain, URI symptoms  HPI: Bailey Reese is a 18 y.o. female with medical history significant for sickle cell anemia, sickle cell pain crisis, chronic pain syndrome, anemia of chronic disease, hemorrhagic stroke, PTSD, anxiety and depression who presented to the [facility] ED for evaluation of URI symptoms and pain all over. Patient reports that on Saturday, she started having sore throat, congestion and chest tightness.  The next day, she started having muscle pain all over which worsened on Monday. She took her home as needed oxycodone  without significant relief so she presented to the ED for further evaluation. She also reports associated fevers, chills, productive cough, nausea and vomiting but no dizziness, headache, chest pain, abdominal pain or dysuria. Reports they found out one of her cousins had the the flu and was around multiple family members including patient during Thanksgiving.   ED Course: Initial vitals show temp 99.3, RR 16-20, HR 80-100s, SBP 100-120s, SpO2 91% on room air, placed on 2 L [place]. Initial labs significant for WBC 17.3, Hgb 6.9, platelet 4 6, normal renal function, AST/ALT 91/84, positive influenza A virus, negative influenza B, RSV and COVID. EKG shows sinus tach. CXR shows no active disease. Pt received IV fluid, IV Dilaudid  1 mg x 2, IV Zofran , IV Benadryl  and IV Toradol .  The sickle cell team at [facility] was consulted and recommended transfer to [facility]. [facility] was consulted for admission after patient arrived.  Review of Systems: Please see HPI for pertinent positives and negatives. A complete 10 system review of systems are otherwise negative.  Past Medical History:  Diagnosis Date   Anxiety    Depression    PTSD (post-traumatic stress disorder)    Sickle cell anemia (HCC)    Past Surgical History:  Procedure  Laterality Date   BIOPSY  07/04/2023   Procedure: BIOPSY;  Surgeon: Saintclair Jasper, MD;  Location: [facility];  Service: Gastroenterology;;   CHOLECYSTECTOMY     ESOPHAGOGASTRODUODENOSCOPY (EGD) WITH PROPOFOL  N/A 07/04/2023   Procedure: ESOPHAGOGASTRODUODENOSCOPY (EGD) WITH PROPOFOL ;  Surgeon: Saintclair Jasper, MD;  Location: [facility];  Service: Gastroenterology;  Laterality: N/A;   WRIST SURGERY     Social History:  reports that she has never smoked. She has never used smokeless tobacco. She reports that she does not drink alcohol  and does not use drugs.  Allergies  Allergen Reactions   Cherry Anaphylaxis, Itching and Swelling   Droperidol  Anxiety and Other (See Comments)    Droperidol  is a medication that prevents nausea and vomiting caused by surgical procedures. The brand name of this medication is Inapsine . = Pt states muscle tension, involuntary movements, eye drifting, and SEIZURES   Olanzapine  Other (See Comments)    Drug-induced liver injury   Other Anaphylaxis, Itching, Swelling and Other (See Comments)    Pitted fruits - Throat itches  ALSO- THE PATIENT IS PRONE TO HAVE SEIZURES.   Peanut-Containing Drug Products Anaphylaxis, Itching and Swelling   Plum Pulp Anaphylaxis and Hives   Morphine  And Codeine Hives and Itching   Peach [Prunus Persica] Itching, Swelling and Other (See Comments)   Acetaminophen  Nausea And Vomiting   Compazine  [Prochlorperazine ] Other (See Comments) and Hypertension    Patient stated that this medication causes her HR to increase and well as BP. She also states that it cause hallucinations.   Ibuprofen  Nausea And Vomiting    Family  History  Problem Relation Age of Onset   Diabetes Paternal Aunt      Prior to Admission medications   Medication Sig Start Date End Date Taking? Authorizing Provider  budesonide-formoterol  (SYMBICORT) 80-4.5 MCG/ACT inhaler Inhale 2 puffs into the lungs in the morning and at bedtime. 12/29/22 02/20/24  [provider]  Calcium Carb-Cholecalciferol (CALCIUM 600+D3 PO) Take 2 tablets by mouth daily.    [provider]  ergocalciferol  (VITAMIN D2) 1.25 MG (50000 UT) capsule Take 1 capsule (50,000 Units total) by mouth once a week. Patient taking differently: Take 50,000 Units by mouth every Wednesday. 02/12/24   Oley Bascom RAMAN, NP  esomeprazole  (NEXIUM ) 40 MG capsule Take 1 capsule (40 mg total) by mouth daily as needed (reflux). 08/29/23   Ijaola, Onyeje M, NP  fexofenadine (ALLEGRA) 180 MG tablet Take 180 mg by mouth in the morning. 12/29/22   [provider]  hydroxyurea  (HYDREA ) 500 MG capsule Take 1,500 mg by mouth daily. May take with food to minimize GI side effects.    [provider]  hydrOXYzine  (ATARAX ) 25 MG tablet Take 25 mg by mouth See admin instructions. Take 25 mg by mouth twice a day and an additional 25 mg once a day as needed for anxiety    [provider]  lubiprostone  (AMITIZA ) 24 MCG capsule Take 24 mcg by mouth in the morning and at bedtime. 11/12/23   [provider]  ondansetron  (ZOFRAN ) 4 MG tablet Take 4 mg by mouth See admin instructions. Take 4 mg by mouth up to 4 times a day 12/29/23   [provider]  Oxycodone  HCl 10 MG TABS Take 1 tablet (10 mg total) by mouth every 6 (six) hours as needed (pain). 04/13/24   Oley Bascom RAMAN, NP  pantoprazole  (PROTONIX ) 40 MG tablet Take 1 tablet (40 mg total) by mouth 2 (two) times daily. Patient taking differently: Take 40 mg by mouth daily as needed (reflux). 07/05/23 07/04/24  Sim Emery CROME, MD  prazosin  (MINIPRESS ) 1 MG capsule Take 1 mg by mouth at bedtime.    [provider]  pregabalin  (LYRICA ) 150 MG capsule Take 1 capsule (150 mg total) by mouth 3 (three) times daily. 03/20/24   Nichols, Tonya S, NP  scopolamine (TRANSDERM-SCOP) 1 MG/3DAYS Place 1 patch onto the skin every 3 (three) days. 01/03/24   [provider]    Physical Exam: BP 110/62 (BP  Location: Left Arm)   Pulse 89   Temp 98.1 F (36.7 C) (Oral)   Resp 16   Ht 5' 5 (1.651 m)   Wt 70.3 kg   SpO2 97%   BMI 25.79 kg/m  General: Pleasant, acutely ill young woman laying in bed. No acute distress. HEENT: [place]/AT. Anicteric sclera. Dry mucous membrane CV: Mild tachycardia.  Regular rhythm. No murmurs, rubs, or gallops. No LE edema Pulmonary: On 2 L . Lungs CTAB. Normal effort. No wheezing or rales. Decreased breath sounds at the bases. Abdominal: Soft, nontender, nondistended. Normal bowel sounds. Extremities: Palpable radial and DP pulses. Normal ROM. Skin: Warm and dry. No obvious rash or lesions. Neuro: A&Ox3. Moves all extremities. Normal sensation to light touch. No focal deficit. Psych: Normal mood and affect          Labs on Admission:  Basic Metabolic Panel: Recent Labs  Lab 04/16/24 1319  NA 136  K 3.7  CL 102  CO2 22  GLUCOSE 151*  BUN 10  CREATININE 0.59  CALCIUM 9.5  Liver Function Tests: Recent Labs  Lab 04/16/24 1319  AST 91*  ALT 84*  ALKPHOS 149*  BILITOT 4.3*  PROT 8.7*  ALBUMIN 4.7   No results for input(s): LIPASE, AMYLASE in the last 168 hours. No results for input(s): AMMONIA in the last 168 hours. CBC: Recent Labs  Lab 04/16/24 1319  WBC 17.3*  NEUTROABS 12.2*  HGB 6.9*  HCT 18.7*  MCV 90.3  PLT 406*   Cardiac Enzymes: No results for input(s): CKTOTAL, CKMB, CKMBINDEX, TROPONINI in the last 168 hours. BNP (last 3 results) Recent Labs    12/01/23 2047  BNP 22.4    ProBNP (last 3 results) No results for input(s): PROBNP in the last 8760 hours.  CBG: No results for input(s): GLUCAP in the last 168 hours.  Radiological Exams on Admission: DG Chest 2 View Result Date: 04/16/2024 CLINICAL DATA:  Cough.  Sickle cell crisis with pain and vomiting. EXAM: CHEST - 2 VIEW COMPARISON:  03/01/2024 FINDINGS: Patient is slightly rotated to the right. Lungs are adequately inflated. No airspace  consolidation or effusion. Cardiomediastinal silhouette and remainder of the exam is unchanged. IMPRESSION: No active cardiopulmonary disease. Electronically Signed   By: Toribio Agreste M.D.   On: 04/16/2024 13:02   Assessment/Plan Bailey Reese is a 19 y.o. female with medical history significant for sickle cell anemia, sickle cell pain crisis, chronic pain syndrome, anemia of chronic disease, hemorrhagic stroke, PTSD, anxiety and depression who presented to the [facility] ED for evaluation of pain all over and admitted for sickle cell pain crisis in the setting of flu infection.  # Sickle cell pain crisis - Presented with 3 to 4 days of URI symptoms and pain all over - Chest imaging with no sign of acute chest - Patient positive for influenza A virus, likely trigger of her sickle cell pain - Observation admission for pain control - Continue weight-based PCA pump - Benadryl  25 mg PRN for itching - IV NS at 125 cc/h for 1 day - Continue hydroxyurea  and as needed oxycodone   # Influenza A infection - Patient with 3-4 days of URI symptoms after sick contact during Thanksgiving - Currently out of the window for Tamiflu to be effective - Symptomatic care with IV hydration, Mucinex  and as needed Tylenol  - Droplet and contact precautions  # Acute hypoxic respiratory failure - Patient found to be hypoxic to the low 90s, requiring 2 L [place] - Secondary to acute influenza A infection and anemia - Continue Breo Ellipta  - Continue supplemental O2 and wean as able - Incentive spirometer  # Sickle cell anemia # Anemia of chronic disease - Hgb of 6.9 around her baseline of 7-8 - Continue transfusion of 1 unit PRBC - Follow-up post-transfusion H&H  # Chronic pain syndrome - Continue home Lyrica  and as needed oxycodone   # GERD - Continue Protonix    DVT prophylaxis: SCDs    Code Status: Full Code  Consults called: None  Family Communication: No family at bedside  Severity of Illness: The  appropriate patient status for this patient is INPATIENT. Inpatient status is judged to be reasonable and necessary in order to provide the required intensity of service to ensure the patient's safety. The patient's presenting symptoms, physical exam findings, and initial radiographic and laboratory data in the context of their chronic comorbidities is felt to place them at high risk for further clinical deterioration. Furthermore, it is not anticipated that the patient will be medically stable for discharge from the hospital within 2  midnights of admission.   * I certify that at the point of admission it is my clinical judgment that the patient will require inpatient hospital care spanning beyond 2 midnights from the point of admission due to high intensity of service, high risk for further deterioration and high frequency of surveillance required.*  Level of care: Med-Surg    Lou Claretta HERO, MD 04/16/2024, 6:36 PM [facility] Pager: [phone] Isaiah 41:10   If 7PM-7AM, please contact night-coverage [email or web address] Password [email or web address]

## 2024-04-16 NOTE — ED Triage Notes (Signed)
 Pt c/o coughing, wheezing, vomiting, and fever x3 days and is now having a SCC crisis. Pt mucous membranes appear pale and dry. Pt c/o pain everywhere.

## 2024-04-16 NOTE — ED Notes (Signed)
EMTALA reviewed by Charge RN 

## 2024-04-16 NOTE — Progress Notes (Signed)
 Messaged provider about Hgb Level, patient in no acute distress, VSS, asymptomatic, awaiting response

## 2024-04-17 LAB — CBC
HCT: 21.8 % — ABNORMAL LOW (ref 36.0–46.0)
Hemoglobin: 7.7 g/dL — ABNORMAL LOW (ref 12.0–15.0)
MCH: 32 pg (ref 26.0–34.0)
MCHC: 35.3 g/dL (ref 30.0–36.0)
MCV: 90.5 fL (ref 80.0–100.0)
Platelets: 409 K/uL — ABNORMAL HIGH (ref 150–400)
RBC: 2.41 MIL/uL — ABNORMAL LOW (ref 3.87–5.11)
RDW: 19.8 % — ABNORMAL HIGH (ref 11.5–15.5)
WBC: 22.8 K/uL — ABNORMAL HIGH (ref 4.0–10.5)
nRBC: 0.8 % — ABNORMAL HIGH (ref 0.0–0.2)

## 2024-04-17 LAB — BASIC METABOLIC PANEL WITH GFR
Anion gap: 12 (ref 5–15)
BUN: 7 mg/dL (ref 6–20)
CO2: 20 mmol/L — ABNORMAL LOW (ref 22–32)
Calcium: 8.6 mg/dL — ABNORMAL LOW (ref 8.9–10.3)
Chloride: 103 mmol/L (ref 98–111)
Creatinine, Ser: 0.51 mg/dL (ref 0.44–1.00)
GFR, Estimated: >60 mL/min (ref >60)
Glucose, Bld: 85 mg/dL (ref 70–99)
Potassium: 3.8 mmol/L (ref 3.5–5.1)
Sodium: 134 mmol/L — ABNORMAL LOW (ref 135–145)

## 2024-04-17 NOTE — Progress Notes (Cosign Needed Addendum)
 Patient ID: Bailey Reese, female   DOB: [birth year], 18 y.o.   MRN: [id number] Subjective: Bailey Reese  is a 18 y.o. female with medical history significant of sickle cell disease, chronic pain, history of hemorrhagic stroke in 2017, asthma, PTSD, depression, anxiety, GERD presenting from [facility] ED due to concern for sickle cell pain crisis.  Evaluation of URI symptoms and pain all over.  Patient endorses pain of 8/10 this morning, fever is resolved, ongoing cough. Denies N/V/D. No urinary symptoms  Objective:  Vital signs in last 24 hours:  Vitals:   04/18/24 0123 04/18/24 0408 04/18/24 0619 04/18/24 0743  BP:   115/60   Pulse:   (!) 102   Resp: 14 15 18 11   Temp:   98 F (36.7 C)   TempSrc:   Oral   SpO2: 95% 96% 97% 95%  Weight:      Height:        Intake/Output from previous day:   Intake/Output Summary (Last 24 hours) at 04/18/2024 1016 Last data filed at 04/18/2024 0355 Gross per 24 hour  Intake 1589.26 ml  Output --  Net 1589.26 ml    Physical Exam: General: Alert, awake, oriented x3, in no acute distress.  HEENT: [place]/AT PEERL, EOMI Neck: Trachea midline,  no masses, no thyromegal,y no JVD, no carotid bruit OROPHARYNX:  Moist, No exudate/ erythema/lesions.  Heart: Regular rate and rhythm, without murmurs, rubs, gallops, PMI non-displaced, no heaves or thrills on palpation.  Lungs: Cough Abdomen: Soft, nontender, nondistended, positive bowel sounds, no masses no hepatosplenomegaly noted..  Neuro: No focal neurological deficits noted cranial nerves II through XII grossly intact. DTRs 2+ bilaterally upper and lower extremities. Strength 5 out of 5 in bilateral upper and lower extremities. Musculoskeletal:Generalize body tenderness Psychiatric: Patient alert and oriented x3, good insight and cognition, good recent to remote recall. Lymph node survey: No cervical axillary or inguinal lymphadenopathy noted.  Lab Results:  Basic Metabolic Panel:    Component  Value Date/Time   NA 134 (L) 04/17/2024 0509   NA 139 02/09/2024 1433   NA 135 05/01/2013 1440   K 3.8 04/17/2024 0509   K 4.3 05/01/2013 1440   CL 103 04/17/2024 0509   CL 102 05/01/2013 1440   CO2 20 (L) 04/17/2024 0509   CO2 27 (H) 05/01/2013 1440   BUN 7 04/17/2024 0509   BUN 7 02/09/2024 1433   BUN 13 05/01/2013 1440   CREATININE 0.51 04/17/2024 0509   CREATININE 0.40 (L) 05/01/2013 1440   GLUCOSE 85 04/17/2024 0509   GLUCOSE 108 (H) 05/01/2013 1440   CALCIUM 8.6 (L) 04/17/2024 0509   CALCIUM 9.8 05/01/2013 1440   CBC:    Component Value Date/Time   WBC 16.6 (H) 04/18/2024 0617   HGB 7.0 (L) 04/18/2024 0617   HGB 8.3 (L) 02/09/2024 1433   HCT 20.3 (L) 04/18/2024 0617   HCT 25.2 (L) 02/09/2024 1433   PLT 401 (H) 04/18/2024 0617   PLT 359 02/09/2024 1433   MCV 89.8 04/18/2024 0617   MCV 98 (H) 02/09/2024 1433   MCV 89 05/01/2013 1440   NEUTROABS 12.2 (H) 04/16/2024 1319   NEUTROABS 7.3 (H) 02/09/2024 1433   NEUTROABS 7.9 05/30/2012 1958   LYMPHSABS 1.7 04/16/2024 1319   LYMPHSABS 2.8 02/09/2024 1433   LYMPHSABS 5.0 05/30/2012 1958   MONOABS 3.0 (H) 04/16/2024 1319   MONOABS 1.6 (H) 05/30/2012 1958   EOSABS 0.0 04/16/2024 1319   EOSABS 0.3 02/09/2024 1433   EOSABS 0.6 05/30/2012  1958   BASOSABS 0.2 (H) 04/16/2024 1319   BASOSABS 0.1 02/09/2024 1433   BASOSABS 3 04/28/2013 1736   BASOSABS 0.2 (H) 05/30/2012 1958    Recent Results (from the past 240 hours)  Resp panel by RT-PCR (RSV, Flu A&B, Covid) Anterior Nasal Swab     Status: Abnormal   Collection Time: 04/16/24  1:44 PM   Specimen: Anterior Nasal Swab  Result Value Ref Range Status   SARS Coronavirus 2 by RT PCR NEGATIVE NEGATIVE Final    Comment: (NOTE) SARS-CoV-2 target nucleic acids are NOT DETECTED.  The SARS-CoV-2 RNA is generally detectable in upper respiratory specimens during the acute phase of infection. The lowest concentration of SARS-CoV-2 viral copies this assay can detect is 138  copies/mL. A negative result does not preclude SARS-Cov-2 infection and should not be used as the sole basis for treatment or other patient management decisions. A negative result may occur with  improper specimen collection/handling, submission of specimen other than nasopharyngeal swab, presence of viral mutation(s) within the areas targeted by this assay, and inadequate number of viral copies(<138 copies/mL). A negative result must be combined with clinical observations, patient history, and epidemiological information. The expected result is Negative.  Fact Sheet for Patients:  bloggercourse.com  Fact Sheet for Healthcare Providers:  seriousbroker.it  This test is no t yet approved or cleared by the [place]  FDA and  has been authorized for detection and/or diagnosis of SARS-CoV-2 by FDA under an Emergency Use Authorization (EUA). This EUA will remain  in effect (meaning this test can be used) for the duration of the COVID-19 declaration under Section 564(b)(1) of the Act, 21 U.S.C.section 360bbb-3(b)(1), unless the authorization is terminated  or revoked sooner.       Influenza A by PCR POSITIVE (A) NEGATIVE Final   Influenza B by PCR NEGATIVE NEGATIVE Final    Comment: (NOTE) The Xpert Xpress SARS-CoV-2/FLU/RSV plus assay is intended as an aid in the diagnosis of influenza from Nasopharyngeal swab specimens and should not be used as a sole basis for treatment. Nasal washings and aspirates are unacceptable for Xpert Xpress SARS-CoV-2/FLU/RSV testing.  Fact Sheet for Patients: bloggercourse.com  Fact Sheet for Healthcare Providers: seriousbroker.it  This test is not yet approved or cleared by the [place]  FDA and has been authorized for detection and/or diagnosis of SARS-CoV-2 by FDA under an Emergency Use Authorization (EUA). This EUA will remain in effect  (meaning this test can be used) for the duration of the COVID-19 declaration under Section 564(b)(1) of the Act, 21 U.S.C. section 360bbb-3(b)(1), unless the authorization is terminated or revoked.     Resp Syncytial Virus by PCR NEGATIVE NEGATIVE Final    Comment: (NOTE) Fact Sheet for Patients: bloggercourse.com  Fact Sheet for Healthcare Providers: seriousbroker.it  This test is not yet approved or cleared by the [place]  FDA and has been authorized for detection and/or diagnosis of SARS-CoV-2 by FDA under an Emergency Use Authorization (EUA). This EUA will remain in effect (meaning this test can be used) for the duration of the COVID-19 declaration under Section 564(b)(1) of the Act, 21 U.S.C. section 360bbb-3(b)(1), unless the authorization is terminated or revoked.  Performed at [facility], [address], [place], [place] [address]     Studies/Results: [place] Chest 2 View Result Date: 04/16/2024 CLINICAL DATA:  Cough.  Sickle cell crisis with pain and vomiting. EXAM: CHEST - 2 VIEW COMPARISON:  03/01/2024 FINDINGS: Patient is slightly rotated to the right. Lungs are adequately  inflated. No airspace consolidation or effusion. Cardiomediastinal silhouette and remainder of the exam is unchanged. IMPRESSION: No active cardiopulmonary disease. Electronically Signed   By: Toribio Agreste M.D.   On: 04/16/2024 13:02    Medications: Scheduled Meds:  dextromethorphan-guaiFENesin   1 tablet Oral BID   fluticasone  furoate-vilanterol  1 puff Inhalation Daily   HYDROmorphone    Intravenous Q4H   hydroxyurea   1,500 mg Oral Daily   lubiprostone   24 mcg Oral BID WC   pantoprazole   40 mg Oral Daily   pregabalin   150 mg Oral TID   scopolamine  1 patch Transdermal Q72H   senna-docusate  1 tablet Oral BID   Continuous Infusions:   PRN Meds:.acetaminophen , bisacodyl, diphenhydrAMINE , naloxone  **AND** sodium chloride  flush,  ondansetron  (ZOFRAN ) IV, oxyCODONE , polyethylene glycol  Consultants: None  Procedures: None  Antibiotics: None  Assessment/Plan: Principal Problem:   Sickle cell anemia with crisis (HCC) Active Problems:   Chronic pain syndrome   Leukocytosis   Anemia of chronic disease   Influenza A   Acute hypoxic respiratory failure (HCC)   Hb Sickle Cell Disease with Pain crisis: Continue IVF 0.45% Saline @KVO ,  continue weight based Dilaudid  PCA, IV Toradol  15 mg Q 6 H for a total of 5 days, continue oral home pain medications as ordered. Monitor vitals very closely, Re-evaluate pain scale regularly, 2 L of Oxygen by [place]. Patient encouraged to ambulate on the hallway today.  Leukocytosis: Elevated, patient is positive for Flu.  Anemia of Chronic Disease:Hgb currently at baseline no clinical indication for transfusion.  Chronic pain Syndrome:  continue oral home pain medication  GAD ( generalized anxiety disorder): stable, continue medication/follow up as scheduled.   Asthma, chronic: Stable, no acute exacerbation   Influenza A:  Currently out of the window for Tamiflu, symptomatic care, Mucinex , IV hydration and tylenol  as needed.     Code Status: Full Code Family Communication: N/A Disposition Plan: Not yet ready for discharge  [name] Cover NP  If 7PM-7AM, please contact night-coverage.  04/17/2024, 10:16 AM  LOS: 2 days

## 2024-04-17 NOTE — Plan of Care (Signed)
   Problem: Education: Goal: Knowledge of General Education information will improve Description Including pain rating scale, medication(s)/side effects and non-pharmacologic comfort measures Outcome: Progressing

## 2024-04-18 LAB — BPAM RBC
Blood Product Expiration Date: 202512262359
ISSUE DATE / TIME: 202512030003
Unit Type and Rh: 5100

## 2024-04-18 LAB — CBC
HCT: 20.3 % — ABNORMAL LOW (ref 36.0–46.0)
Hemoglobin: 7 g/dL — ABNORMAL LOW (ref 12.0–15.0)
MCH: 31 pg (ref 26.0–34.0)
MCHC: 34.5 g/dL (ref 30.0–36.0)
MCV: 89.8 fL (ref 80.0–100.0)
Platelets: 401 K/uL — ABNORMAL HIGH (ref 150–400)
RBC: 2.26 MIL/uL — ABNORMAL LOW (ref 3.87–5.11)
RDW: 20.3 % — ABNORMAL HIGH (ref 11.5–15.5)
WBC: 16.6 K/uL — ABNORMAL HIGH (ref 4.0–10.5)
nRBC: 1.3 % — ABNORMAL HIGH (ref 0.0–0.2)

## 2024-04-18 LAB — TYPE AND SCREEN
ABO/RH(D): B POS
Antibody Screen: NEGATIVE
Unit division: 0

## 2024-04-18 MED ORDER — PHENOL 1.4 % MT LIQD: 1.0000 | OROMUCOSAL | Status: DC | PRN

## 2024-04-18 NOTE — Progress Notes (Cosign Needed Addendum)
 Patient ID: Bailey Reese, female   DOB: [birth year], 18 y.o.   MRN: [id number] Subjective: Bailey Reese  is a 18 y.o. female with medical history significant of sickle cell disease, chronic pain, history of hemorrhagic stroke in 2017, asthma, PTSD, depression, anxiety, GERD presenting from [facility] ED due to concern for sickle cell pain crisis.  Evaluation of URI symptoms and pain all over.  Patient endorses pain of 9/10 this morning, fever is resolved, ongoing cough. Denies N/V/D. No urinary symptoms  Objective:  Vital signs in last 24 hours:  Vitals:   04/18/24 0123 04/18/24 0408 04/18/24 0619 04/18/24 0743  BP:   115/60   Pulse:   (!) 102   Resp: 14 15 18 11   Temp:   98 F (36.7 C)   TempSrc:   Oral   SpO2: 95% 96% 97% 95%  Weight:      Height:        Intake/Output from previous day:   Intake/Output Summary (Last 24 hours) at 04/18/2024 1019 Last data filed at 04/18/2024 0355 Gross per 24 hour  Intake 1589.26 ml  Output --  Net 1589.26 ml    Physical Exam: General: Alert, awake, oriented x3, in no acute distress.  HEENT: [place]/AT PEERL, EOMI Neck: Trachea midline,  no masses, no thyromegal,y no JVD, no carotid bruit OROPHARYNX:  Moist, No exudate/ erythema/lesions.  Heart: Regular rate and rhythm, without murmurs, rubs, gallops, PMI non-displaced, no heaves or thrills on palpation.  Lungs: Cough  Abdomen: Soft, nontender, nondistended, positive bowel sounds, no masses no hepatosplenomegaly noted..  Neuro: No focal neurological deficits noted cranial nerves II through XII grossly intact. DTRs 2+ bilaterally upper and lower extremities. Strength 5 out of 5 in bilateral upper and lower extremities. Musculoskeletal:Generalize body tenderness Psychiatric: Patient alert and oriented x3, good insight and cognition, good recent to remote recall. Lymph node survey: No cervical axillary or inguinal lymphadenopathy noted.  Lab Results:  Basic Metabolic Panel:    Component  Value Date/Time   NA 134 (L) 04/17/2024 0509   NA 139 02/09/2024 1433   NA 135 05/01/2013 1440   K 3.8 04/17/2024 0509   K 4.3 05/01/2013 1440   CL 103 04/17/2024 0509   CL 102 05/01/2013 1440   CO2 20 (L) 04/17/2024 0509   CO2 27 (H) 05/01/2013 1440   BUN 7 04/17/2024 0509   BUN 7 02/09/2024 1433   BUN 13 05/01/2013 1440   CREATININE 0.51 04/17/2024 0509   CREATININE 0.40 (L) 05/01/2013 1440   GLUCOSE 85 04/17/2024 0509   GLUCOSE 108 (H) 05/01/2013 1440   CALCIUM 8.6 (L) 04/17/2024 0509   CALCIUM 9.8 05/01/2013 1440   CBC:    Component Value Date/Time   WBC 16.6 (H) 04/18/2024 0617   HGB 7.0 (L) 04/18/2024 0617   HGB 8.3 (L) 02/09/2024 1433   HCT 20.3 (L) 04/18/2024 0617   HCT 25.2 (L) 02/09/2024 1433   PLT 401 (H) 04/18/2024 0617   PLT 359 02/09/2024 1433   MCV 89.8 04/18/2024 0617   MCV 98 (H) 02/09/2024 1433   MCV 89 05/01/2013 1440   NEUTROABS 12.2 (H) 04/16/2024 1319   NEUTROABS 7.3 (H) 02/09/2024 1433   NEUTROABS 7.9 05/30/2012 1958   LYMPHSABS 1.7 04/16/2024 1319   LYMPHSABS 2.8 02/09/2024 1433   LYMPHSABS 5.0 05/30/2012 1958   MONOABS 3.0 (H) 04/16/2024 1319   MONOABS 1.6 (H) 05/30/2012 1958   EOSABS 0.0 04/16/2024 1319   EOSABS 0.3 02/09/2024 1433   EOSABS 0.6  05/30/2012 1958   BASOSABS 0.2 (H) 04/16/2024 1319   BASOSABS 0.1 02/09/2024 1433   BASOSABS 3 04/28/2013 1736   BASOSABS 0.2 (H) 05/30/2012 1958    Recent Results (from the past 240 hours)  Resp panel by RT-PCR (RSV, Flu A&B, Covid) Anterior Nasal Swab     Status: Abnormal   Collection Time: 04/16/24  1:44 PM   Specimen: Anterior Nasal Swab  Result Value Ref Range Status   SARS Coronavirus 2 by RT PCR NEGATIVE NEGATIVE Final    Comment: (NOTE) SARS-CoV-2 target nucleic acids are NOT DETECTED.  The SARS-CoV-2 RNA is generally detectable in upper respiratory specimens during the acute phase of infection. The lowest concentration of SARS-CoV-2 viral copies this assay can detect is 138  copies/mL. A negative result does not preclude SARS-Cov-2 infection and should not be used as the sole basis for treatment or other patient management decisions. A negative result may occur with  improper specimen collection/handling, submission of specimen other than nasopharyngeal swab, presence of viral mutation(s) within the areas targeted by this assay, and inadequate number of viral copies(<138 copies/mL). A negative result must be combined with clinical observations, patient history, and epidemiological information. The expected result is Negative.  Fact Sheet for Patients:  bloggercourse.com  Fact Sheet for Healthcare Providers:  seriousbroker.it  This test is no t yet approved or cleared by the [place]  FDA and  has been authorized for detection and/or diagnosis of SARS-CoV-2 by FDA under an Emergency Use Authorization (EUA). This EUA will remain  in effect (meaning this test can be used) for the duration of the COVID-19 declaration under Section 564(b)(1) of the Act, 21 U.S.C.section 360bbb-3(b)(1), unless the authorization is terminated  or revoked sooner.       Influenza A by PCR POSITIVE (A) NEGATIVE Final   Influenza B by PCR NEGATIVE NEGATIVE Final    Comment: (NOTE) The Xpert Xpress SARS-CoV-2/FLU/RSV plus assay is intended as an aid in the diagnosis of influenza from Nasopharyngeal swab specimens and should not be used as a sole basis for treatment. Nasal washings and aspirates are unacceptable for Xpert Xpress SARS-CoV-2/FLU/RSV testing.  Fact Sheet for Patients: bloggercourse.com  Fact Sheet for Healthcare Providers: seriousbroker.it  This test is not yet approved or cleared by the [place]  FDA and has been authorized for detection and/or diagnosis of SARS-CoV-2 by FDA under an Emergency Use Authorization (EUA). This EUA will remain in effect  (meaning this test can be used) for the duration of the COVID-19 declaration under Section 564(b)(1) of the Act, 21 U.S.C. section 360bbb-3(b)(1), unless the authorization is terminated or revoked.     Resp Syncytial Virus by PCR NEGATIVE NEGATIVE Final    Comment: (NOTE) Fact Sheet for Patients: bloggercourse.com  Fact Sheet for Healthcare Providers: seriousbroker.it  This test is not yet approved or cleared by the [place]  FDA and has been authorized for detection and/or diagnosis of SARS-CoV-2 by FDA under an Emergency Use Authorization (EUA). This EUA will remain in effect (meaning this test can be used) for the duration of the COVID-19 declaration under Section 564(b)(1) of the Act, 21 U.S.C. section 360bbb-3(b)(1), unless the authorization is terminated or revoked.  Performed at [facility], [address] [place], [place] [address]     Studies/Results: [facility] Chest 2 View Result Date: 04/16/2024 CLINICAL DATA:  Cough.  Sickle cell crisis with pain and vomiting. EXAM: CHEST - 2 VIEW COMPARISON:  03/01/2024 FINDINGS: Patient is slightly rotated to the right. Lungs are  adequately inflated. No airspace consolidation or effusion. Cardiomediastinal silhouette and remainder of the exam is unchanged. IMPRESSION: No active cardiopulmonary disease. Electronically Signed   By: Toribio Agreste M.D.   On: 04/16/2024 13:02    Medications: Scheduled Meds:  dextromethorphan-guaiFENesin   1 tablet Oral BID   fluticasone  furoate-vilanterol  1 puff Inhalation Daily   HYDROmorphone    Intravenous Q4H   hydroxyurea   1,500 mg Oral Daily   lubiprostone   24 mcg Oral BID WC   pantoprazole   40 mg Oral Daily   pregabalin   150 mg Oral TID   scopolamine  1 patch Transdermal Q72H   senna-docusate  1 tablet Oral BID   Continuous Infusions:   PRN Meds:.acetaminophen , bisacodyl, diphenhydrAMINE , naloxone  **AND** sodium chloride  flush,  ondansetron  (ZOFRAN ) IV, oxyCODONE , polyethylene glycol  Consultants: None  Procedures: None  Antibiotics: None  Assessment/Plan: Principal Problem:   Sickle cell anemia with crisis (HCC) Active Problems:   Chronic pain syndrome   Leukocytosis   Anemia of chronic disease   Influenza A   Acute hypoxic respiratory failure (HCC)   Hb Sickle Cell Disease with Pain crisis: Continue IVF 0.45% Saline @KVO ,  continue weight based Dilaudid  PCA, IV Toradol  15 mg Q 6 H for a total of 5 days, continue oral home pain medications as ordered. Monitor vitals very closely, Re-evaluate pain scale regularly, 2 L of Oxygen by Pink. Patient encouraged to ambulate on the hallway today.  Leukocytosis: Elevated, patient is positive for Flu.  Anemia of Chronic Disease:Hgb currently at baseline no clinical indication for transfusion.  Chronic pain Syndrome:  continue oral home pain medication  GAD ( generalized anxiety disorder): stable, continue medication/follow up as scheduled.   Asthma, chronic: Stable, no acute exacerbation   Influenza A:  Currently out of the window for Tamiflu, symptomatic care, Mucinex , IV hydration and tylenol  as needed.     Code Status: Full Code Family Communication: N/A Disposition Plan: Not yet ready for discharge  [name] Cover NP  If 7PM-7AM, please contact night-coverage.  04/18/2024, 10:19 AM  LOS: 2 days

## 2024-04-18 NOTE — Plan of Care (Signed)

## 2024-04-18 NOTE — Plan of Care (Signed)
  Problem: Education: Goal: Knowledge of General Education information will improve Description: Including pain rating scale, medication(s)/side effects and non-pharmacologic comfort measures Outcome: Progressing   Problem: Health Behavior/Discharge Planning: Goal: Ability to manage health-related needs will improve Outcome: Progressing   Problem: Clinical Measurements: Goal: Cardiovascular complication will be avoided Outcome: Progressing   Problem: Activity: Goal: Risk for activity intolerance will decrease Outcome: Progressing   Problem: Nutrition: Goal: Adequate nutrition will be maintained Outcome: Progressing   Problem: Coping: Goal: Level of anxiety will decrease Outcome: Progressing   Problem: Elimination: Goal: Will not experience complications related to bowel motility Outcome: Progressing

## 2024-04-18 NOTE — TOC Initial Note (Signed)
 Transition of Care [facility]) - Initial/Assessment Note    Patient Details  Name: Bailey Reese MRN: [id number] Date of Birth: Aug 30, 2005  [facility]) CM/SW Contact:    Toy LITTIE Agar, RN Phone Number:[phone]  04/18/2024, 3:05 PM  Clinical Narrative:                 Inpatient care manager following patient with high risk for readmission. Patient admitted from home with sickle cell pain crisis from home where she normally functions independently. Patient has no current needs noted at this time. Inpatient care manager will continue to follow for potential disposition needs.   Expected Discharge Plan: Home/Self Care Barriers to Discharge: Continued Medical Work up   Patient Goals and CMS Choice Patient states their goals for this hospitalization and ongoing recovery are:: Wants to feel better to go home   Choice offered to / list presented to : NA      Expected Discharge Plan and Services In-house Referral: NA Discharge Planning Services: CM Consult Post Acute Care Choice: NA Living arrangements for the past 2 months: Single Family Home                 DME Arranged: N/A DME Agency: NA       HH Arranged: NA          Prior Living Arrangements/Services Living arrangements for the past 2 months: Single Family Home   Patient language and need for interpreter reviewed:: Yes Do you feel safe going back to the place where you live?: Yes      Need for Family Participation in Patient Care: No (Comment) Care giver support system in place?: Yes (comment) Current home services: DME (Home O2, walker) Criminal Activity/Legal Involvement Pertinent to Current Situation/Hospitalization: No - Comment as needed  Activities of Daily Living   ADL Screening (condition at time of admission) Independently performs ADLs?: Yes (appropriate for developmental age) Is the patient deaf or have difficulty hearing?: No Does the patient have difficulty seeing, even when wearing  glasses/contacts?: No Does the patient have difficulty concentrating, remembering, or making decisions?: No  Permission Sought/Granted Permission sought to share information with : Family Supports                Emotional Assessment Appearance:: Appears stated age Attitude/Demeanor/Rapport: Gracious Affect (typically observed): Pleasant Orientation: : Oriented to Self, Oriented to Place, Oriented to  Time, Oriented to Situation Alcohol  / Substance Use: Not Applicable Psych Involvement: No (comment)  Admission diagnosis:  Sickle cell anemia with crisis (HCC) [D57.00] Patient Active Problem List   Diagnosis Date Noted   Influenza A 04/16/2024   Acute hypoxic respiratory failure (HCC) 04/16/2024   UTI symptoms 02/20/2024   Seizure-like activity (HCC) 01/01/2024   Anemia of chronic disease 11/21/2023   Sickle cell anemia with pain (HCC) 08/21/2023   Avascular necrosis of hip, right (HCC) 07/23/2023   GI bleed 07/03/2023   Vasoocclusive sickle cell crisis (HCC) 07/02/2023   Sickle cell anemia with crisis (HCC) 05/28/2023   Chronic pain syndrome 05/28/2023   Leukocytosis 05/28/2023   Thrombocytosis 05/28/2023   Hyperbilirubinemia 05/08/2023   GAD (generalized anxiety disorder) 03/29/2023   Asthma, chronic 12/23/2022   Red blood cell antibody positive 09/24/2022   Obstructive sleep apnea syndrome 02/04/2022   Mild intermittent asthma without complication 02/04/2022   Environmental allergies 02/04/2022   Allergic rhinitis 08/12/2020   Vitamin D  insufficiency 06/20/2018   Family dysfunction    Sickle cell crisis (HCC) 08/23/2017  Sickle cell pain crisis (HCC) 08/23/2017   PCP:  Oley Bascom RAMAN, NP Pharmacy:   [facility] [address] (N), [place] - [address] [place] (N) [place] [address] Phone: [phone] Fax: [phone]  D[facility] - [facility] [address] [place] [address] Phone:  [phone] Fax: [phone]     Social Drivers of Health (SDOH) Social History: SDOH Screenings   Food Insecurity: Food Insecurity Present (04/16/2024)  Housing: High Risk (04/16/2024)  Transportation Needs: Unmet Transportation Needs (04/16/2024)  Utilities: Not At Risk (04/16/2024)  Depression (PHQ2-9): High Risk (02/09/2024)  Financial Resource Strain: Low Risk (07/25/2023)   Received from [facility]  Physical Activity: Insufficiently Active (12/10/2021)   Received from [facility]  Social Connections: Socially Isolated (01/15/2024)  Stress: Stress Concern Present (12/10/2021)   Received from [facility]  Tobacco Use: Low Risk  (04/16/2024)   SDOH Interventions:     Readmission Risk Interventions    01/16/2024    8:38 AM 01/01/2024    9:35 PM 12/11/2023    3:08 PM  Readmission Risk Prevention Plan  Transportation Screening Complete Complete Complete  Medication Review (RN Care Manager) Complete Complete Referral to Pharmacy  PCP or Specialist appointment within 3-5 days of discharge Complete Complete Complete  HRI or Home Care Consult Complete Complete Complete  SW Recovery Care/Counseling Consult Complete Complete Patient refused  Palliative Care Screening Not Applicable Not Applicable Not Applicable  Skilled Nursing Facility Not Applicable Not Applicable Not Applicable

## 2024-04-19 LAB — CBC
HCT: 20.5 % — ABNORMAL LOW (ref 36.0–46.0)
Hemoglobin: 7.1 g/dL — ABNORMAL LOW (ref 12.0–15.0)
MCH: 30.7 pg (ref 26.0–34.0)
MCHC: 34.6 g/dL (ref 30.0–36.0)
MCV: 88.7 fL (ref 80.0–100.0)
Platelets: 456 K/uL — ABNORMAL HIGH (ref 150–400)
RBC: 2.31 MIL/uL — ABNORMAL LOW (ref 3.87–5.11)
RDW: 19.2 % — ABNORMAL HIGH (ref 11.5–15.5)
WBC: 15.3 K/uL — ABNORMAL HIGH (ref 4.0–10.5)
nRBC: 2.3 % — ABNORMAL HIGH (ref 0.0–0.2)

## 2024-04-19 MED ORDER — ONDANSETRON HCL 4 MG/2ML IJ SOLN
4.0000 mg | Freq: Once | INTRAMUSCULAR | Status: AC
  Administered 2024-04-19: 4 mg via INTRAVENOUS
  Filled 2024-04-19: qty 2

## 2024-04-19 NOTE — Plan of Care (Signed)
°  Problem: Education: °Goal: Knowledge of General Education information will improve °Description: Including pain rating scale, medication(s)/side effects and non-pharmacologic comfort measures °Outcome: Progressing °  °Problem: Activity: °Goal: Risk for activity intolerance will decrease °Outcome: Progressing °  °Problem: Nutrition: °Goal: Adequate nutrition will be maintained °Outcome: Progressing °  °Problem: Coping: °Goal: Level of anxiety will decrease °Outcome: Progressing °  °Problem: Elimination: °Goal: Will not experience complications related to bowel motility °Outcome: Progressing °Goal: Will not experience complications related to urinary retention °Outcome: Progressing °  °Problem: Safety: °Goal: Ability to remain free from injury will improve °Outcome: Progressing °  °

## 2024-04-19 NOTE — Progress Notes (Signed)
 Patient ID: Bailey Reese, female   DOB: 2005/11/17, 18 y.o.   MRN: [id number] Subjective: Bailey Reese  is a 18 y.o. female with medical history significant of sickle cell disease, chronic pain, history of hemorrhagic stroke in 2017, asthma, PTSD, depression, anxiety, GERD presenting from [facility] ED due to concern for sickle cell pain crisis.  Evaluation of URI symptoms and pain all over.  Patient endorses pain of 8/10 this morning, fever is resolved, ongoing cough. Denies N/V/D. No urinary symptoms  Objective:  Vital signs in last 24 hours:  Vitals:   04/19/24 0525 04/19/24 0635 04/19/24 0709 04/19/24 0737  BP:      Pulse:      Resp: 16 15  13   Temp:      TempSrc:      SpO2:  96% 94% 95%  Weight:      Height:        Intake/Output from previous day:   Intake/Output Summary (Last 24 hours) at 04/19/2024 1049 Last data filed at 04/19/2024 0300 Gross per 24 hour  Intake 65 ml  Output --  Net 65 ml    Physical Exam: General: Alert, awake, oriented x3, in no acute distress.  HEENT: [place]/AT PEERL, EOMI Neck: Trachea midline,  no masses, no thyromegal,y no JVD, no carotid bruit OROPHARYNX:  Moist, No exudate/ erythema/lesions.  Heart: Regular rate and rhythm, without murmurs, rubs, gallops, PMI non-displaced, no heaves or thrills on palpation.  Lungs: Cough  Abdomen: Soft, nontender, nondistended, positive bowel sounds, no masses no hepatosplenomegaly noted..  Neuro: No focal neurological deficits noted cranial nerves II through XII grossly intact. DTRs 2+ bilaterally upper and lower extremities. Strength 5 out of 5 in bilateral upper and lower extremities. Musculoskeletal:Generalize body tenderness Psychiatric: Patient alert and oriented x3, good insight and cognition, good recent to remote recall. Lymph node survey: No cervical axillary or inguinal lymphadenopathy noted.  Lab Results:  Basic Metabolic Panel:    Component Value Date/Time   NA 134 (L) 04/17/2024 0509    NA 139 02/09/2024 1433   NA 135 05/01/2013 1440   K 3.8 04/17/2024 0509   K 4.3 05/01/2013 1440   CL 103 04/17/2024 0509   CL 102 05/01/2013 1440   CO2 20 (L) 04/17/2024 0509   CO2 27 (H) 05/01/2013 1440   BUN 7 04/17/2024 0509   BUN 7 02/09/2024 1433   BUN 13 05/01/2013 1440   CREATININE 0.51 04/17/2024 0509   CREATININE 0.40 (L) 05/01/2013 1440   GLUCOSE 85 04/17/2024 0509   GLUCOSE 108 (H) 05/01/2013 1440   CALCIUM 8.6 (L) 04/17/2024 0509   CALCIUM 9.8 05/01/2013 1440   CBC:    Component Value Date/Time   WBC 15.3 (H) 04/19/2024 0719   HGB 7.1 (L) 04/19/2024 0719   HGB 8.3 (L) 02/09/2024 1433   HCT 20.5 (L) 04/19/2024 0719   HCT 25.2 (L) 02/09/2024 1433   PLT 456 (H) 04/19/2024 0719   PLT 359 02/09/2024 1433   MCV 88.7 04/19/2024 0719   MCV 98 (H) 02/09/2024 1433   MCV 89 05/01/2013 1440   NEUTROABS 12.2 (H) 04/16/2024 1319   NEUTROABS 7.3 (H) 02/09/2024 1433   NEUTROABS 7.9 05/30/2012 1958   LYMPHSABS 1.7 04/16/2024 1319   LYMPHSABS 2.8 02/09/2024 1433   LYMPHSABS 5.0 05/30/2012 1958   MONOABS 3.0 (H) 04/16/2024 1319   MONOABS 1.6 (H) 05/30/2012 1958   EOSABS 0.0 04/16/2024 1319   EOSABS 0.3 02/09/2024 1433   EOSABS 0.6 05/30/2012 1958  BASOSABS 0.2 (H) 04/16/2024 1319   BASOSABS 0.1 02/09/2024 1433   BASOSABS 3 04/28/2013 1736   BASOSABS 0.2 (H) 05/30/2012 1958    Recent Results (from the past 240 hours)  Resp panel by RT-PCR (RSV, Flu A&B, Covid) Anterior Nasal Swab     Status: Abnormal   Collection Time: 04/16/24  1:44 PM   Specimen: Anterior Nasal Swab  Result Value Ref Range Status   SARS Coronavirus 2 by RT PCR NEGATIVE NEGATIVE Final    Comment: (NOTE) SARS-CoV-2 target nucleic acids are NOT DETECTED.  The SARS-CoV-2 RNA is generally detectable in upper respiratory specimens during the acute phase of infection. The lowest concentration of SARS-CoV-2 viral copies this assay can detect is 138 copies/mL. A negative result does not preclude  SARS-Cov-2 infection and should not be used as the sole basis for treatment or other patient management decisions. A negative result may occur with  improper specimen collection/handling, submission of specimen other than nasopharyngeal swab, presence of viral mutation(s) within the areas targeted by this assay, and inadequate number of viral copies(<138 copies/mL). A negative result must be combined with clinical observations, patient history, and epidemiological information. The expected result is Negative.  Fact Sheet for Patients:  bloggercourse.com  Fact Sheet for Healthcare Providers:  seriousbroker.it  This test is no t yet approved or cleared by the [place]  FDA and  has been authorized for detection and/or diagnosis of SARS-CoV-2 by FDA under an Emergency Use Authorization (EUA). This EUA will remain  in effect (meaning this test can be used) for the duration of the COVID-19 declaration under Section 564(b)(1) of the Act, 21 U.S.C.section 360bbb-3(b)(1), unless the authorization is terminated  or revoked sooner.       Influenza A by PCR POSITIVE (A) NEGATIVE Final   Influenza B by PCR NEGATIVE NEGATIVE Final    Comment: (NOTE) The Xpert Xpress SARS-CoV-2/FLU/RSV plus assay is intended as an aid in the diagnosis of influenza from Nasopharyngeal swab specimens and should not be used as a sole basis for treatment. Nasal washings and aspirates are unacceptable for Xpert Xpress SARS-CoV-2/FLU/RSV testing.  Fact Sheet for Patients: bloggercourse.com  Fact Sheet for Healthcare Providers: seriousbroker.it  This test is not yet approved or cleared by the [place]  FDA and has been authorized for detection and/or diagnosis of SARS-CoV-2 by FDA under an Emergency Use Authorization (EUA). This EUA will remain in effect (meaning this test can be used) for the duration of  the COVID-19 declaration under Section 564(b)(1) of the Act, 21 U.S.C. section 360bbb-3(b)(1), unless the authorization is terminated or revoked.     Resp Syncytial Virus by PCR NEGATIVE NEGATIVE Final    Comment: (NOTE) Fact Sheet for Patients: bloggercourse.com  Fact Sheet for Healthcare Providers: seriousbroker.it  This test is not yet approved or cleared by the [place]  FDA and has been authorized for detection and/or diagnosis of SARS-CoV-2 by FDA under an Emergency Use Authorization (EUA). This EUA will remain in effect (meaning this test can be used) for the duration of the COVID-19 declaration under Section 564(b)(1) of the Act, 21 U.S.C. section 360bbb-3(b)(1), unless the authorization is terminated or revoked.  Performed at [facility], [address], [place], [place] [address]     Studies/Results: No results found.   Medications: Scheduled Meds:  dextromethorphan -guaiFENesin   1 tablet Oral BID   fluticasone  furoate-vilanterol  1 puff Inhalation Daily   HYDROmorphone    Intravenous Q4H   hydroxyurea   1,500 mg Oral Daily  lubiprostone   24 mcg Oral BID WC   pantoprazole   40 mg Oral Daily   pregabalin   150 mg Oral TID   scopolamine   1 patch Transdermal Q72H   senna-docusate  1 tablet Oral BID   Continuous Infusions:   PRN Meds:.acetaminophen , bisacodyl , diphenhydrAMINE , naloxone  **AND** sodium chloride  flush, ondansetron  (ZOFRAN ) IV, oxyCODONE , polyethylene glycol  Consultants: None  Procedures: None  Antibiotics: None  Assessment/Plan: Principal Problem:   Sickle cell anemia with crisis (HCC) Active Problems:   Chronic pain syndrome   Leukocytosis   Anemia of chronic disease   Influenza A   Acute hypoxic respiratory failure (HCC)   Hb Sickle Cell Disease with Pain crisis: Continue IVF 0.45% Saline @KVO ,  continue weight based Dilaudid  PCA, IV Toradol  15 mg Q 6 H for a total of  5 days, continue oral home pain medications as ordered. Monitor vitals very closely, Re-evaluate pain scale regularly, 2 L of Oxygen by [place]. Patient encouraged to ambulate on the hallway today.  Leukocytosis: Elevated, patient is positive for Flu.  Anemia of Chronic Disease:Hgb currently at baseline no clinical indication for transfusion.  Chronic pain Syndrome:  continue oral home pain medication  GAD ( generalized anxiety disorder): stable, continue medication/follow up as scheduled.   Asthma, chronic: Stable, no acute exacerbation   Influenza A: Currently out of the window for Tamiflu , symptomatic care, Mucinex , IV hydration and tylenol  as needed.     Code Status: Full Code Family Communication: N/A Disposition Plan: Not yet ready for discharge  [name] Cover NP  If 7PM-7AM, please contact night-coverage.  04/19/2024, 10:49 AM  LOS: 3 days

## 2024-04-20 DIAGNOSIS — D5700 Hb-SS disease with crisis, unspecified: Secondary | ICD-10-CM | POA: Diagnosis not present

## 2024-04-20 LAB — COMPREHENSIVE METABOLIC PANEL WITH GFR
ALT: 235 U/L — ABNORMAL HIGH (ref 0–44)
AST: 180 U/L — ABNORMAL HIGH (ref 15–41)
Albumin: 4 g/dL (ref 3.5–5.0)
Alkaline Phosphatase: 231 U/L — ABNORMAL HIGH (ref 38–126)
Anion gap: 10 (ref 5–15)
BUN: <5 mg/dL — ABNORMAL LOW (ref 6–20)
CO2: 28 mmol/L (ref 22–32)
Calcium: 9.3 mg/dL (ref 8.9–10.3)
Chloride: 102 mmol/L (ref 98–111)
Creatinine, Ser: 0.56 mg/dL (ref 0.44–1.00)
GFR, Estimated: >60 mL/min (ref >60)
Glucose, Bld: 92 mg/dL (ref 70–99)
Potassium: 3.3 mmol/L — ABNORMAL LOW (ref 3.5–5.1)
Sodium: 141 mmol/L (ref 135–145)
Total Bilirubin: 4.7 mg/dL — ABNORMAL HIGH (ref 0.0–1.2)
Total Protein: 7.8 g/dL (ref 6.5–8.1)

## 2024-04-20 NOTE — Plan of Care (Signed)
  Problem: Education: Goal: Knowledge of General Education information will improve Description: Including pain rating scale, medication(s)/side effects and non-pharmacologic comfort measures Outcome: Progressing   Problem: Clinical Measurements: Goal: Cardiovascular complication will be avoided Outcome: Progressing   Problem: Health Behavior/Discharge Planning: Goal: Ability to manage health-related needs will improve Outcome: Not Progressing   Problem: Clinical Measurements: Goal: Ability to maintain clinical measurements within normal limits will improve Outcome: Not Progressing Goal: Will remain free from infection Outcome: Not Progressing Goal: Diagnostic test results will improve Outcome: Not Progressing Goal: Respiratory complications will improve Outcome: Not Progressing

## 2024-04-20 NOTE — Progress Notes (Signed)
 Patient ID: Bailey Reese, female   DOB: 12-06-2005, 18 y.o.   MRN: [id number] Subjective: Bailey Reese  is a 18 y.o. female with medical history significant of sickle cell disease, chronic pain, history of hemorrhagic stroke in 2017, asthma, PTSD, depression, anxiety, GERD presenting from [facility] ED due to concern for sickle cell pain crisis.  Evaluation of URI symptoms and pain all over.   Patient continues to endorse significant pain, generalized.  She is still coughing, productive of yellowish-white sputum.  She has no fever in the last 24 hours.  She denies any nausea, vomiting or diarrhea.  No urinary symptoms.  Objective:  Vital signs in last 24 hours:  Vitals:   04/20/24 0841 04/20/24 0848 04/20/24 1000 04/20/24 1203  BP:   121/67   Pulse:   69   Resp:  12 14 12   Temp:      TempSrc:      SpO2: 99%  99%   Weight:      Height:        Intake/Output from previous day:   Intake/Output Summary (Last 24 hours) at 04/20/2024 1250 Last data filed at 04/19/2024 1605 Gross per 24 hour  Intake 120 ml  Output --  Net 120 ml    Physical Exam: General: Alert, awake, oriented x3, in no acute distress.  HEENT: [place]/AT PEERL, EOMI Neck: Trachea midline,  no masses, no thyromegal,y no JVD, no carotid bruit OROPHARYNX:  Moist, No exudate/ erythema/lesions.  Heart: Regular rate and rhythm, without murmurs, rubs, gallops, PMI non-displaced, no heaves or thrills on palpation.  Lungs: Clear to auscultation, no wheezing or rhonchi noted. No increased vocal fremitus resonant to percussion  Abdomen: Soft, nontender, nondistended, positive bowel sounds, no masses no hepatosplenomegaly noted..  Neuro: No focal neurological deficits noted cranial nerves II through XII grossly intact. DTRs 2+ bilaterally upper and lower extremities. Strength 5 out of 5 in bilateral upper and lower extremities. Musculoskeletal: No warm swelling or erythema around joints, no spinal tenderness noted. Psychiatric:  Patient alert and oriented x3, good insight and cognition, good recent to remote recall. Lymph node survey: No cervical axillary or inguinal lymphadenopathy noted.  Lab Results:  Basic Metabolic Panel:    Component Value Date/Time   NA 141 04/20/2024 0746   NA 139 02/09/2024 1433   NA 135 05/01/2013 1440   K 3.3 (L) 04/20/2024 0746   K 4.3 05/01/2013 1440   CL 102 04/20/2024 0746   CL 102 05/01/2013 1440   CO2 28 04/20/2024 0746   CO2 27 (H) 05/01/2013 1440   BUN <5 (L) 04/20/2024 0746   BUN 7 02/09/2024 1433   BUN 13 05/01/2013 1440   CREATININE 0.56 04/20/2024 0746   CREATININE 0.40 (L) 05/01/2013 1440   GLUCOSE 92 04/20/2024 0746   GLUCOSE 108 (H) 05/01/2013 1440   CALCIUM 9.3 04/20/2024 0746   CALCIUM 9.8 05/01/2013 1440   CBC:    Component Value Date/Time   WBC 15.3 (H) 04/19/2024 0719   HGB 7.1 (L) 04/19/2024 0719   HGB 8.3 (L) 02/09/2024 1433   HCT 20.5 (L) 04/19/2024 0719   HCT 25.2 (L) 02/09/2024 1433   PLT 456 (H) 04/19/2024 0719   PLT 359 02/09/2024 1433   MCV 88.7 04/19/2024 0719   MCV 98 (H) 02/09/2024 1433   MCV 89 05/01/2013 1440   NEUTROABS 12.2 (H) 04/16/2024 1319   NEUTROABS 7.3 (H) 02/09/2024 1433   NEUTROABS 7.9 05/30/2012 1958   LYMPHSABS 1.7 04/16/2024 1319  LYMPHSABS 2.8 02/09/2024 1433   LYMPHSABS 5.0 05/30/2012 1958   MONOABS 3.0 (H) 04/16/2024 1319   MONOABS 1.6 (H) 05/30/2012 1958   EOSABS 0.0 04/16/2024 1319   EOSABS 0.3 02/09/2024 1433   EOSABS 0.6 05/30/2012 1958   BASOSABS 0.2 (H) 04/16/2024 1319   BASOSABS 0.1 02/09/2024 1433   BASOSABS 3 04/28/2013 1736   BASOSABS 0.2 (H) 05/30/2012 1958    Recent Results (from the past 240 hours)  Resp panel by RT-PCR (RSV, Flu A&B, Covid) Anterior Nasal Swab     Status: Abnormal   Collection Time: 04/16/24  1:44 PM   Specimen: Anterior Nasal Swab  Result Value Ref Range Status   SARS Coronavirus 2 by RT PCR NEGATIVE NEGATIVE Final    Comment: (NOTE) SARS-CoV-2 target nucleic acids are  NOT DETECTED.  The SARS-CoV-2 RNA is generally detectable in upper respiratory specimens during the acute phase of infection. The lowest concentration of SARS-CoV-2 viral copies this assay can detect is 138 copies/mL. A negative result does not preclude SARS-Cov-2 infection and should not be used as the sole basis for treatment or other patient management decisions. A negative result may occur with  improper specimen collection/handling, submission of specimen other than nasopharyngeal swab, presence of viral mutation(s) within the areas targeted by this assay, and inadequate number of viral copies(<138 copies/mL). A negative result must be combined with clinical observations, patient history, and epidemiological information. The expected result is Negative.  Fact Sheet for Patients:  bloggercourse.com  Fact Sheet for Healthcare Providers:  seriousbroker.it  This test is no t yet approved or cleared by the [place]  FDA and  has been authorized for detection and/or diagnosis of SARS-CoV-2 by FDA under an Emergency Use Authorization (EUA). This EUA will remain  in effect (meaning this test can be used) for the duration of the COVID-19 declaration under Section 564(b)(1) of the Act, 21 U.S.C.section 360bbb-3(b)(1), unless the authorization is terminated  or revoked sooner.       Influenza A by PCR POSITIVE (A) NEGATIVE Final   Influenza B by PCR NEGATIVE NEGATIVE Final    Comment: (NOTE) The Xpert Xpress SARS-CoV-2/FLU/RSV plus assay is intended as an aid in the diagnosis of influenza from Nasopharyngeal swab specimens and should not be used as a sole basis for treatment. Nasal washings and aspirates are unacceptable for Xpert Xpress SARS-CoV-2/FLU/RSV testing.  Fact Sheet for Patients: bloggercourse.com  Fact Sheet for Healthcare Providers: seriousbroker.it  This test is  not yet approved or cleared by the [place]  FDA and has been authorized for detection and/or diagnosis of SARS-CoV-2 by FDA under an Emergency Use Authorization (EUA). This EUA will remain in effect (meaning this test can be used) for the duration of the COVID-19 declaration under Section 564(b)(1) of the Act, 21 U.S.C. section 360bbb-3(b)(1), unless the authorization is terminated or revoked.     Resp Syncytial Virus by PCR NEGATIVE NEGATIVE Final    Comment: (NOTE) Fact Sheet for Patients: bloggercourse.com  Fact Sheet for Healthcare Providers: seriousbroker.it  This test is not yet approved or cleared by the [place]  FDA and has been authorized for detection and/or diagnosis of SARS-CoV-2 by FDA under an Emergency Use Authorization (EUA). This EUA will remain in effect (meaning this test can be used) for the duration of the COVID-19 declaration under Section 564(b)(1) of the Act, 21 U.S.C. section 360bbb-3(b)(1), unless the authorization is terminated or revoked.  Performed at [facility], [address], [place], [place] [address]  Studies/Results: No results found.  Medications: Scheduled Meds:  dextromethorphan -guaiFENesin   1 tablet Oral BID   fluticasone  furoate-vilanterol  1 puff Inhalation Daily   HYDROmorphone    Intravenous Q4H   hydroxyurea   1,500 mg Oral Daily   lubiprostone   24 mcg Oral BID WC   pantoprazole   40 mg Oral Daily   pregabalin   150 mg Oral TID   scopolamine   1 patch Transdermal Q72H   senna-docusate  1 tablet Oral BID   Continuous Infusions: PRN Meds:.bisacodyl , diphenhydrAMINE , naloxone  **AND** sodium chloride  flush, ondansetron  (ZOFRAN ) IV, oxyCODONE , polyethylene glycol  Consultants: None  Procedures: None  Antibiotics: None  Assessment/Plan: Principal Problem:   Sickle cell anemia with crisis (HCC) Active Problems:   Chronic pain syndrome   Leukocytosis    Anemia of chronic disease   Influenza A   Acute hypoxic respiratory failure (HCC)  Hb Sickle Cell Disease with Pain crisis: IV fluid at KVO.  Continue weight based Dilaudid  PCA at current dose setting, continue IV Toradol  15 mg Q 6 H for a total of 5 days, continue oral home pain medications as ordered. Monitor vitals very closely, Re-evaluate pain scale regularly, 2 L of Oxygen by [place]. Patient encouraged to ambulate on the hallway today.  Influenza A infection: Patient is out of the window for Tamiflu , still coughing.  Will continue symptomatic management at this time with cough expectorants, hydration and Tylenol  as needed. Leukocytosis: Most likely reactive to both sickle cell pain crisis and viremia.  Will continue to monitor closely without antibiotics at this time. Anemia of Chronic Disease: Hemoglobin is stable at baseline today.  There is no clinical indication for blood transfusion at this time.  Will monitor very closely and transfuse as appropriate. Chronic pain Syndrome: Continue oral home pain medications as ordered. Generalized anxiety disorder: Clinically stable.  Patient denies any suicidal ideations or thoughts.  Will continue her home medications. Chronic asthma: Clinically stable.  Patient does not required extra supplemental oxygen.  Maintaining her saturation well.  Continue medication.  Code Status: Full Code Family Communication: N/A Disposition Plan: Not yet ready for discharge  Racquelle Hyser  If 7PM-7AM, please contact night-coverage.  04/20/2024, 12:50 PM  LOS: 4 days

## 2024-04-20 NOTE — Plan of Care (Signed)

## 2024-04-21 DIAGNOSIS — D5700 Hb-SS disease with crisis, unspecified: Secondary | ICD-10-CM | POA: Diagnosis not present

## 2024-04-21 LAB — COMPREHENSIVE METABOLIC PANEL WITH GFR
ALT: 188 U/L — ABNORMAL HIGH (ref 0–44)
AST: 122 U/L — ABNORMAL HIGH (ref 15–41)
Albumin: 3.8 g/dL (ref 3.5–5.0)
Alkaline Phosphatase: 219 U/L — ABNORMAL HIGH (ref 38–126)
Anion gap: 11 (ref 5–15)
BUN: 5 mg/dL — ABNORMAL LOW (ref 6–20)
CO2: 26 mmol/L (ref 22–32)
Calcium: 9 mg/dL (ref 8.9–10.3)
Chloride: 104 mmol/L (ref 98–111)
Creatinine, Ser: 0.58 mg/dL (ref 0.44–1.00)
GFR, Estimated: >60 mL/min (ref >60)
Glucose, Bld: 96 mg/dL (ref 70–99)
Potassium: 3.9 mmol/L (ref 3.5–5.1)
Sodium: 140 mmol/L (ref 135–145)
Total Bilirubin: 4.5 mg/dL — ABNORMAL HIGH (ref 0.0–1.2)
Total Protein: 7.6 g/dL (ref 6.5–8.1)

## 2024-04-21 LAB — CBC
HCT: 22.4 % — ABNORMAL LOW (ref 36.0–46.0)
Hemoglobin: 7.4 g/dL — ABNORMAL LOW (ref 12.0–15.0)
MCH: 30.5 pg (ref 26.0–34.0)
MCHC: 33 g/dL (ref 30.0–36.0)
MCV: 92.2 fL (ref 80.0–100.0)
Platelets: 489 K/uL — ABNORMAL HIGH (ref 150–400)
RBC: 2.43 MIL/uL — ABNORMAL LOW (ref 3.87–5.11)
RDW: 20.2 % — ABNORMAL HIGH (ref 11.5–15.5)
WBC: 13.8 K/uL — ABNORMAL HIGH (ref 4.0–10.5)
nRBC: 10 % — ABNORMAL HIGH (ref 0.0–0.2)

## 2024-04-21 NOTE — Progress Notes (Signed)
 Patient ID: Bailey Reese, female   DOB: 2005/10/14, 18 y.o.   MRN: [id number] Subjective: Bailey Reese  is a 18 y.o. female with medical history significant of sickle cell disease, chronic pain, history of hemorrhagic stroke in 2017, asthma, PTSD, depression, anxiety, GERD presenting from [facility] ED due to concern for sickle cell pain crisis.  Evaluation of URI symptoms and pain all over.   Patient is very tearful this morning, she continues to endorse significant pain, at 9/10.  She is writhing in pain in bed, does not appear that patient is ambulating.  She is still coughing.  She however denies any fever, chest pain, shortness of breath, nausea, vomiting or diarrhea.  No urinary symptoms.  Objective:  Vital signs in last 24 hours:  Vitals:   04/21/24 0259 04/21/24 0700 04/21/24 0840 04/21/24 0950  BP:  119/69  130/65  Pulse:  80  81  Resp: 18 18 16 18   Temp:  98.8 F (37.1 C)    TempSrc:  Oral    SpO2:  98%  99%  Weight:      Height:        Intake/Output from previous day:   Intake/Output Summary (Last 24 hours) at 04/21/2024 1146 Last data filed at 04/20/2024 1704 Gross per 24 hour  Intake 240 ml  Output 800 ml  Net -560 ml    Physical Exam: General: Alert, awake, oriented x3, in subjective painful distress.  No objective findings to support. HEENT: [place]/AT PEERL, EOMI Neck: Trachea midline,  no masses, no thyromegal,y no JVD, no carotid bruit OROPHARYNX:  Moist, No exudate/ erythema/lesions.  Heart: Regular rate and rhythm, without murmurs, rubs, gallops, PMI non-displaced, no heaves or thrills on palpation.  Lungs: Clear to auscultation, no wheezing or rhonchi noted. No increased vocal fremitus resonant to percussion  Abdomen: Soft, nontender, nondistended, positive bowel sounds, no masses no hepatosplenomegaly noted..  Neuro: No focal neurological deficits noted cranial nerves II through XII grossly intact. DTRs 2+ bilaterally upper and lower extremities. Strength 5  out of 5 in bilateral upper and lower extremities. Musculoskeletal: No warm swelling or erythema around joints, no spinal tenderness noted. Psychiatric: Patient alert and oriented x3, good insight and cognition, good recent to remote recall. Lymph node survey: No cervical axillary or inguinal lymphadenopathy noted.  Lab Results:  Basic Metabolic Panel:    Component Value Date/Time   NA 140 04/21/2024 0348   NA 139 02/09/2024 1433   NA 135 05/01/2013 1440   K 3.9 04/21/2024 0348   K 4.3 05/01/2013 1440   CL 104 04/21/2024 0348   CL 102 05/01/2013 1440   CO2 26 04/21/2024 0348   CO2 27 (H) 05/01/2013 1440   BUN 5 (L) 04/21/2024 0348   BUN 7 02/09/2024 1433   BUN 13 05/01/2013 1440   CREATININE 0.58 04/21/2024 0348   CREATININE 0.40 (L) 05/01/2013 1440   GLUCOSE 96 04/21/2024 0348   GLUCOSE 108 (H) 05/01/2013 1440   CALCIUM 9.0 04/21/2024 0348   CALCIUM 9.8 05/01/2013 1440   CBC:    Component Value Date/Time   WBC 13.8 (H) 04/21/2024 0348   HGB 7.4 (L) 04/21/2024 0348   HGB 8.3 (L) 02/09/2024 1433   HCT 22.4 (L) 04/21/2024 0348   HCT 25.2 (L) 02/09/2024 1433   PLT 489 (H) 04/21/2024 0348   PLT 359 02/09/2024 1433   MCV 92.2 04/21/2024 0348   MCV 98 (H) 02/09/2024 1433   MCV 89 05/01/2013 1440   NEUTROABS 12.2 (H)  04/16/2024 1319   NEUTROABS 7.3 (H) 02/09/2024 1433   NEUTROABS 7.9 05/30/2012 1958   LYMPHSABS 1.7 04/16/2024 1319   LYMPHSABS 2.8 02/09/2024 1433   LYMPHSABS 5.0 05/30/2012 1958   MONOABS 3.0 (H) 04/16/2024 1319   MONOABS 1.6 (H) 05/30/2012 1958   EOSABS 0.0 04/16/2024 1319   EOSABS 0.3 02/09/2024 1433   EOSABS 0.6 05/30/2012 1958   BASOSABS 0.2 (H) 04/16/2024 1319   BASOSABS 0.1 02/09/2024 1433   BASOSABS 3 04/28/2013 1736   BASOSABS 0.2 (H) 05/30/2012 1958    Recent Results (from the past 240 hours)  Resp panel by RT-PCR (RSV, Flu A&B, Covid) Anterior Nasal Swab     Status: Abnormal   Collection Time: 04/16/24  1:44 PM   Specimen: Anterior Nasal  Swab  Result Value Ref Range Status   SARS Coronavirus 2 by RT PCR NEGATIVE NEGATIVE Final    Comment: (NOTE) SARS-CoV-2 target nucleic acids are NOT DETECTED.  The SARS-CoV-2 RNA is generally detectable in upper respiratory specimens during the acute phase of infection. The lowest concentration of SARS-CoV-2 viral copies this assay can detect is 138 copies/mL. A negative result does not preclude SARS-Cov-2 infection and should not be used as the sole basis for treatment or other patient management decisions. A negative result may occur with  improper specimen collection/handling, submission of specimen other than nasopharyngeal swab, presence of viral mutation(s) within the areas targeted by this assay, and inadequate number of viral copies(<138 copies/mL). A negative result must be combined with clinical observations, patient history, and epidemiological information. The expected result is Negative.  Fact Sheet for Patients:  bloggercourse.com  Fact Sheet for Healthcare Providers:  seriousbroker.it  This test is no t yet approved or cleared by the [place]  FDA and  has been authorized for detection and/or diagnosis of SARS-CoV-2 by FDA under an Emergency Use Authorization (EUA). This EUA will remain  in effect (meaning this test can be used) for the duration of the COVID-19 declaration under Section 564(b)(1) of the Act, 21 U.S.C.section 360bbb-3(b)(1), unless the authorization is terminated  or revoked sooner.       Influenza A by PCR POSITIVE (A) NEGATIVE Final   Influenza B by PCR NEGATIVE NEGATIVE Final    Comment: (NOTE) The Xpert Xpress SARS-CoV-2/FLU/RSV plus assay is intended as an aid in the diagnosis of influenza from Nasopharyngeal swab specimens and should not be used as a sole basis for treatment. Nasal washings and aspirates are unacceptable for Xpert Xpress SARS-CoV-2/FLU/RSV testing.  Fact Sheet for  Patients: bloggercourse.com  Fact Sheet for Healthcare Providers: seriousbroker.it  This test is not yet approved or cleared by the [place]  FDA and has been authorized for detection and/or diagnosis of SARS-CoV-2 by FDA under an Emergency Use Authorization (EUA). This EUA will remain in effect (meaning this test can be used) for the duration of the COVID-19 declaration under Section 564(b)(1) of the Act, 21 U.S.C. section 360bbb-3(b)(1), unless the authorization is terminated or revoked.     Resp Syncytial Virus by PCR NEGATIVE NEGATIVE Final    Comment: (NOTE) Fact Sheet for Patients: bloggercourse.com  Fact Sheet for Healthcare Providers: seriousbroker.it  This test is not yet approved or cleared by the [place]  FDA and has been authorized for detection and/or diagnosis of SARS-CoV-2 by FDA under an Emergency Use Authorization (EUA). This EUA will remain in effect (meaning this test can be used) for the duration of the COVID-19 declaration under Section 564(b)(1) of the Act, 21 U.S.C. section  360bbb-3(b)(1), unless the authorization is terminated or revoked.  Performed at [facility], [address], [place], [place] [address]     Studies/Results: No results found.  Medications: Scheduled Meds:  dextromethorphan -guaiFENesin   1 tablet Oral BID   fluticasone  furoate-vilanterol  1 puff Inhalation Daily   HYDROmorphone    Intravenous Q4H   hydroxyurea   1,500 mg Oral Daily   lubiprostone   24 mcg Oral BID WC   pantoprazole   40 mg Oral Daily   pregabalin   150 mg Oral TID   scopolamine   1 patch Transdermal Q72H   senna-docusate  1 tablet Oral BID   Continuous Infusions: PRN Meds:.bisacodyl , diphenhydrAMINE , naloxone  **AND** sodium chloride  flush, ondansetron  (ZOFRAN ) IV, oxyCODONE , polyethylene  glycol  Consultants: None  Procedures: None  Antibiotics: None  Assessment/Plan: Principal Problem:   Sickle cell anemia with crisis (HCC) Active Problems:   Chronic pain syndrome   Leukocytosis   Anemia of chronic disease   Influenza A   Acute hypoxic respiratory failure (HCC)  Hb Sickle Cell Disease with Pain crisis: IV fluid at KVO.  Will continue weight based Dilaudid  PCA at current dose setting, she has completed IV Toradol  15 mg Q 6 H for a total of 5 days, continue oral home pain medications as ordered. Monitor vitals very closely, Re-evaluate pain scale regularly, 2 L of Oxygen by [place]. Patient encouraged to ambulate on the hallway today.  Influenza A infection: Patient is out of the window for Tamiflu , still coughing but improving.  Will continue symptomatic management at this time with cough expectorants, hydration and Tylenol  as needed. Leukocytosis: Most likely reactive to both sickle cell pain crisis and viremia. Will continue to monitor closely without antibiotics at this time. Anemia of Chronic Disease: Hemoglobin continues to be stable at baseline. There is no clinical indication for blood transfusion at this time. Will monitor very closely and transfuse as appropriate. Chronic pain Syndrome: Continue oral home pain medications. Generalized anxiety disorder: Clinically stable. Patient denies any suicidal ideations or thoughts. Will continue her home medications. Chronic asthma: Clinically stable. Patient does not required extra supplemental oxygen.  Maintaining her saturation well. Continue medication.  Code Status: Full Code Family Communication: N/A Disposition Plan: For possible discharge tomorrow morning, 02/21/2024.  Bailey Reese  If 7PM-7AM, please contact night-coverage.  04/21/2024, 11:46 AM  LOS: 5 days

## 2024-04-21 NOTE — Plan of Care (Signed)
  Problem: Education: Goal: Knowledge of General Education information will improve Description: Including pain rating scale, medication(s)/side effects and non-pharmacologic comfort measures Outcome: Progressing   Problem: Clinical Measurements: Goal: Cardiovascular complication will be avoided Outcome: Progressing   Problem: Health Behavior/Discharge Planning: Goal: Ability to manage health-related needs will improve Outcome: Not Progressing   Problem: Clinical Measurements: Goal: Ability to maintain clinical measurements within normal limits will improve Outcome: Not Progressing Goal: Will remain free from infection Outcome: Not Progressing Goal: Diagnostic test results will improve Outcome: Not Progressing Goal: Respiratory complications will improve Outcome: Not Progressing

## 2024-04-22 NOTE — Discharge Summary (Signed)
 Physician Discharge Summary  PRESCILLA Reese FMW:[id number] DOB: [birth year] DOA: 04/16/2024  PCP: Oley Bascom RAMAN, NP  Admit date: 04/16/2024  Discharge date: 04/22/2024  Discharge Diagnoses:  Principal Problem:   Sickle cell anemia with crisis [facility]) Active Problems:   Chronic pain syndrome   Leukocytosis   Anemia of chronic disease   Influenza A   Acute hypoxic respiratory failure (HCC)   Discharge Condition: Stable  Disposition:  Pt is discharged home in good condition and is to follow up with Oley Bascom RAMAN, NP this week to have labs evaluated. [name] is instructed to increase activity slowly and balance with rest for the next few days, and use prescribed medication to complete treatment of pain  Diet: Regular Wt Readings from Last 3 Encounters:  04/16/24 70.3 kg  04/16/24 70.3 kg  03/01/24 70.4 kg (84%, Z= 0.99)*   * Growth percentiles are based on CDC (Girls, 2-20 Years) data.    History of present illness:  Bailey Reese  is a 18 y.o. female with medical history significant of sickle cell disease, chronic pain, history of hemorrhagic stroke in 2017, asthma, PTSD, depression, anxiety, GERD who presenting from [facility] ED due to concern for sickle cell pain crisis.  Evaluation of URI symptoms and pain all over. Patient reported that prior to ED visit, she started having sore throat, congestion and chest tightness. The following day, she started having muscle pain all over which worsened. She took her home oxycodone  without significant relief so she presented to the ED for further evaluation. She also reported associated fevers, chills, productive cough, nausea and vomiting but no dizziness, headache, chest pain, abdominal pain or dysuria. Reports they found out one of her cousins had the flu and was around multiple family members including patient during thanksgiving.    ED course: BP 122/64, Resp 18, Temp 99.3, SP02 100%, Pulse 107, WBC 22.8, Hgb 7.7, Platelet  409, patient presented with pain, cough and fever to the ED, Patient was treated with IVF, IV dilaudid  with no resolution to pain symptoms. Patient is admitted for further evaluation of URI and pain management.     Hospital Course:  Patient was admitted for sickle cell pain crisis and managed appropriately with IVF, IV Dilaudid  via PCA and IV Toradol , Patient was treated symptomatically for Influenza A as she had passed the treatment window for Tamiflu . While on admission patients Hgb remained stable and had no need for transfusion. She is mildly coughing, however patient understands that the viral cough will gradually resolve. She has no new concerns today, her pain is at baseline, she is ambulating without assistance and tolerating PO without nausea or vomiting.  Patient was therefore discharged home today in a hemodynamically stable condition.   Laddie will follow-up with PCP within 1 week of this discharge. Alis was counseled extensively about nonpharmacologic means of pain management, patient verbalized understanding and was appreciative of  the care received during this admission.   We discussed the need for good hydration, monitoring of hydration status, avoidance of heat, cold, stress, and infection triggers. We discussed the need to be adherent with taking her home medications. Patient was reminded of the need to seek medical attention immediately if any symptom of bleeding, anemia, or infection occurs.  Discharge Exam: Vitals:   04/22/24 0626 04/22/24 0734  BP: 115/71   Pulse: 78   Resp: 18 (!) 9  Temp: 98.1 F (36.7 C)   SpO2: 100%    Vitals:   04/22/24 0045  04/22/24 0241 04/22/24 0626 04/22/24 0734  BP: 113/74  115/71   Pulse: 81  78   Resp: 18 14 18  (!) 9  Temp: 98.5 F (36.9 C)  98.1 F (36.7 C)   TempSrc: Oral  Oral   SpO2: 100%  100%   Weight:      Height:        General appearance : Awake, alert, not in any distress. Speech Clear. Not toxic looking HEENT:  Atraumatic and Normocephalic, pupils equally reactive to light and accomodation Neck: Supple, no JVD. No cervical lymphadenopathy.  Chest: Good air entry bilaterally, no added sounds  CVS: S1 S2 regular, no murmurs.  Abdomen: Bowel sounds present, Non tender and not distended with no gaurding, rigidity or rebound. Extremities: B/L Lower Ext shows no edema, both legs are warm to touch Neurology: Awake alert, and oriented X 3, CN II-XII intact, Non focal Skin: No Rash  Discharge Instructions  Discharge Instructions     Call MD for:  persistant nausea and vomiting   Complete by: As directed    Call MD for:  severe uncontrolled pain   Complete by: As directed    Call MD for:  temperature >100.4   Complete by: As directed    Diet - low sodium heart healthy   Complete by: As directed    Increase activity slowly   Complete by: As directed       Allergies as of 04/22/2024       Reactions   Cherry Anaphylaxis, Itching, Swelling   Droperidol  Anxiety, Other (See Comments)   Droperidol  is a medication that prevents nausea and vomiting caused by surgical procedures. The brand name of this medication is Inapsine . = Pt states muscle tension, involuntary movements, eye drifting, and SEIZURES   Olanzapine  Other (See Comments)   Drug-induced liver injury   Other Anaphylaxis, Itching, Swelling, Other (See Comments)   Pitted fruits - Throat itches ALSO- THE PATIENT IS PRONE TO HAVE SEIZURES.   Peanut-containing Drug Products Anaphylaxis, Itching, Swelling   Plum Pulp Anaphylaxis, Hives   Morphine  And Codeine Hives, Itching   Peach [prunus Persica] Itching, Swelling, Other (See Comments)   Acetaminophen  Nausea And Vomiting   Compazine  [prochlorperazine ] Other (See Comments), Hypertension   Patient stated that this medication causes her HR to increase and well as BP. She also states that it cause hallucinations.   Ibuprofen  Nausea And Vomiting        Medication List     TAKE these  medications    budesonide-formoterol  80-4.5 MCG/ACT inhaler Commonly known as: SYMBICORT Inhale 2 puffs into the lungs in the morning and at bedtime.   CALCIUM 600+D3 PO Take 2 tablets by mouth daily.   ergocalciferol  1.25 MG (50000 UT) capsule Commonly known as: VITAMIN D2 Take 1 capsule (50,000 Units total) by mouth once a week. What changed: when to take this   esomeprazole  40 MG capsule Commonly known as: NEXIUM  Take 1 capsule (40 mg total) by mouth daily as needed (reflux).   fexofenadine 180 MG tablet Commonly known as: ALLEGRA Take 180 mg by mouth in the morning.   hydroxyurea  500 MG capsule Commonly known as: HYDREA  Take 1,500 mg by mouth daily. May take with food to minimize GI side effects.   hydrOXYzine  25 MG tablet Commonly known as: ATARAX  Take 25 mg by mouth See admin instructions. Take 25 mg by mouth twice a day and an additional 25 mg once a day as needed for anxiety   lubiprostone  24  MCG capsule Commonly known as: AMITIZA  Take 24 mcg by mouth in the morning and at bedtime.   ondansetron  4 MG tablet Commonly known as: ZOFRAN  Take 4 mg by mouth See admin instructions. Take 4 mg by mouth up to 4 times a day   Oxycodone  HCl 10 MG Tabs Take 1 tablet (10 mg total) by mouth every 6 (six) hours as needed (pain).   pantoprazole  40 MG tablet Commonly known as: Protonix  Take 1 tablet (40 mg total) by mouth 2 (two) times daily. What changed:  when to take this reasons to take this   prazosin  1 MG capsule Commonly known as: MINIPRESS  Take 1 mg by mouth at bedtime.   pregabalin  150 MG capsule Commonly known as: LYRICA  Take 1 capsule (150 mg total) by mouth 3 (three) times daily.   scopolamine  1 MG/3DAYS Commonly known as: TRANSDERM-SCOP Place 1 patch onto the skin every 3 (three) days.        The results of significant diagnostics from this hospitalization (including imaging, microbiology, ancillary and laboratory) are listed below for reference.     Significant Diagnostic Studies: DG Chest 2 View Result Date: 04/16/2024 CLINICAL DATA:  Cough.  Sickle cell crisis with pain and vomiting. EXAM: CHEST - 2 VIEW COMPARISON:  03/01/2024 FINDINGS: Patient is slightly rotated to the right. Lungs are adequately inflated. No airspace consolidation or effusion. Cardiomediastinal silhouette and remainder of the exam is unchanged. IMPRESSION: No active cardiopulmonary disease. Electronically Signed   By: Toribio Agreste M.D.   On: 04/16/2024 13:02    Microbiology: Recent Results (from the past 240 hours)  Resp panel by RT-PCR (RSV, Flu A&B, Covid) Anterior Nasal Swab     Status: Abnormal   Collection Time: 04/16/24  1:44 PM   Specimen: Anterior Nasal Swab  Result Value Ref Range Status   SARS Coronavirus 2 by RT PCR NEGATIVE NEGATIVE Final    Comment: (NOTE) SARS-CoV-2 target nucleic acids are NOT DETECTED.  The SARS-CoV-2 RNA is generally detectable in upper respiratory specimens during the acute phase of infection. The lowest concentration of SARS-CoV-2 viral copies this assay can detect is 138 copies/mL. A negative result does not preclude SARS-Cov-2 infection and should not be used as the sole basis for treatment or other patient management decisions. A negative result may occur with  improper specimen collection/handling, submission of specimen other than nasopharyngeal swab, presence of viral mutation(s) within the areas targeted by this assay, and inadequate number of viral copies(<138 copies/mL). A negative result must be combined with clinical observations, patient history, and epidemiological information. The expected result is Negative.  Fact Sheet for Patients:  bloggercourse.com  Fact Sheet for Healthcare Providers:  seriousbroker.it  This test is no t yet approved or cleared by the [place]  FDA and  has been authorized for detection and/or diagnosis of SARS-CoV-2 by FDA  under an Emergency Use Authorization (EUA). This EUA will remain  in effect (meaning this test can be used) for the duration of the COVID-19 declaration under Section 564(b)(1) of the Act, 21 U.S.C.section 360bbb-3(b)(1), unless the authorization is terminated  or revoked sooner.       Influenza A by PCR POSITIVE (A) NEGATIVE Final   Influenza B by PCR NEGATIVE NEGATIVE Final    Comment: (NOTE) The Xpert Xpress SARS-CoV-2/FLU/RSV plus assay is intended as an aid in the diagnosis of influenza from Nasopharyngeal swab specimens and should not be used as a sole basis for treatment. Nasal washings and aspirates are unacceptable for Xpert  Xpress SARS-CoV-2/FLU/RSV testing.  Fact Sheet for Patients: bloggercourse.com  Fact Sheet for Healthcare Providers: seriousbroker.it  This test is not yet approved or cleared by the [place]  FDA and has been authorized for detection and/or diagnosis of SARS-CoV-2 by FDA under an Emergency Use Authorization (EUA). This EUA will remain in effect (meaning this test can be used) for the duration of the COVID-19 declaration under Section 564(b)(1) of the Act, 21 U.S.C. section 360bbb-3(b)(1), unless the authorization is terminated or revoked.     Resp Syncytial Virus by PCR NEGATIVE NEGATIVE Final    Comment: (NOTE) Fact Sheet for Patients: bloggercourse.com  Fact Sheet for Healthcare Providers: seriousbroker.it  This test is not yet approved or cleared by the [place]  FDA and has been authorized for detection and/or diagnosis of SARS-CoV-2 by FDA under an Emergency Use Authorization (EUA). This EUA will remain in effect (meaning this test can be used) for the duration of the COVID-19 declaration under Section 564(b)(1) of the Act, 21 U.S.C. section 360bbb-3(b)(1), unless the authorization is terminated or revoked.  Performed at [facility], [address] [place], [place] [address]      Labs: Basic Metabolic Panel: Recent Labs  Lab 04/16/24 1319 04/17/24 0509 04/20/24 0746 04/21/24 0348  NA 136 134* 141 140  K 3.7 3.8 3.3* 3.9  CL 102 103 102 104  CO2 22 20* 28 26  GLUCOSE 151* 85 92 96  BUN 10 7 <5* 5*  CREATININE 0.59 0.51 0.56 0.58  CALCIUM 9.5 8.6* 9.3 9.0   Liver Function Tests: Recent Labs  Lab 04/16/24 1319 04/20/24 0746 04/21/24 0348  AST 91* 180* 122*  ALT 84* 235* 188*  ALKPHOS 149* 231* 219*  BILITOT 4.3* 4.7* 4.5*  PROT 8.7* 7.8 7.6  ALBUMIN 4.7 4.0 3.8   No results for input(s): LIPASE, AMYLASE in the last 168 hours. No results for input(s): AMMONIA in the last 168 hours. CBC: Recent Labs  Lab 04/16/24 1319 04/17/24 0509 04/18/24 0617 04/19/24 0719 04/21/24 0348  WBC 17.3* 22.8* 16.6* 15.3* 13.8*  NEUTROABS 12.2*  --   --   --   --   HGB 6.9* 7.7* 7.0* 7.1* 7.4*  HCT 18.7* 21.8* 20.3* 20.5* 22.4*  MCV 90.3 90.5 89.8 88.7 92.2  PLT 406* 409* 401* 456* 489*   Cardiac Enzymes: No results for input(s): CKTOTAL, CKMB, CKMBINDEX, TROPONINI in the last 168 hours. BNP: Invalid input(s): POCBNP CBG: No results for input(s): GLUCAP in the last 168 hours.  Time coordinating discharge: 50 minutes  Signed:  [name] Cover NP   04/22/2024, 11:35 AM

## 2024-04-23 ENCOUNTER — Telehealth: Payer: Self-pay

## 2024-04-23 NOTE — Transitions of Care (Post Inpatient/ED Visit) (Signed)
   04/23/2024  Name: Bailey Reese MRN: [id number] DOB: [birth year]  Today's TOC FU Call Status: Today's TOC FU Call Status:: Unsuccessful Call (1st Attempt) Unsuccessful Call (1st Attempt) Date: 04/23/24  Attempted to reach the patient regarding the most recent Inpatient/ED visit.  Follow Up Plan: Additional outreach attempts will be made to reach the patient to complete the Transitions of Care (Post Inpatient/ED visit) call.   Arvin Seip RN, BSN, CCM [employer], Population Health Case Manager Phone: [phone]

## 2024-04-24 ENCOUNTER — Telehealth: Payer: Self-pay | Admitting: *Deleted

## 2024-04-24 NOTE — Transitions of Care (Post Inpatient/ED Visit) (Signed)
° °  04/24/2024  Name: Bailey Reese MRN: [id number] DOB: [birth year]  Today's TOC FU Call Status: Today's TOC FU Call Status:: Unsuccessful Call (2nd Attempt) Unsuccessful Call (2nd Attempt) Date: 04/24/24  Attempted to reach the patient regarding the most recent Inpatient/ED visit.  Follow Up Plan: Additional outreach attempts will be made to reach the patient to complete the Transitions of Care (Post Inpatient/ED visit) call.   Mliss Creed [facility], BSN RN Care Manager/ Transition of Care [place]/ [facility] [phone]

## 2024-04-25 ENCOUNTER — Telehealth: Payer: Self-pay

## 2024-04-25 NOTE — Telephone Encounter (Signed)
 Copied from CRM #[id number]. Topic: Appointments - Scheduling Inquiry for Clinic >> Apr 25, 2024 12:22 PM Delon DASEN wrote: Reason for CRM: patient calling to schedule depo provera injection- [phone]  Pt will be sent a my chart  message. KH

## 2024-04-26 ENCOUNTER — Telehealth: Payer: Self-pay

## 2024-04-26 NOTE — Transitions of Care (Post Inpatient/ED Visit) (Signed)
° °  04/26/2024  Name: [name] MRN: [id number] DOB: 2006/05/01  Today's TOC FU Call Status: Today's TOC FU Call Status:: Unsuccessful Call (3rd Attempt) Unsuccessful Call (3rd Attempt) Date: 04/26/24  Attempted to reach the patient regarding the most recent Inpatient/ED visit.  Follow Up Plan: No further outreach attempts will be made at this time. We have been unable to contact the patient.  Arvin Seip RN, BSN, CCM [employer], Population Health Case Manager Phone: [phone]

## 2024-04-30 ENCOUNTER — Other Ambulatory Visit: Payer: Self-pay

## 2024-04-30 DIAGNOSIS — D571 Sickle-cell disease without crisis: Secondary | ICD-10-CM

## 2024-04-30 NOTE — Telephone Encounter (Signed)
 Please advise [facility]

## 2024-05-01 MED ORDER — OXYCODONE HCL 10 MG PO TABS: 10.0000 mg | ORAL_TABLET | Freq: Four times a day (QID) | ORAL | 0 refills | Status: DC | PRN

## 2024-05-01 MED ORDER — PREGABALIN 150 MG PO CAPS: 150.0000 mg | ORAL_CAPSULE | Freq: Three times a day (TID) | ORAL | 0 refills | Status: DC

## 2024-05-23 ENCOUNTER — Ambulatory Visit: Payer: Self-pay

## 2024-05-23 ENCOUNTER — Other Ambulatory Visit: Payer: Self-pay

## 2024-05-23 DIAGNOSIS — D571 Sickle-cell disease without crisis: Secondary | ICD-10-CM

## 2024-05-23 MED ORDER — OXYCODONE HCL 10 MG PO TABS: 10.0000 mg | ORAL_TABLET | Freq: Four times a day (QID) | ORAL | 0 refills | Status: AC | PRN

## 2024-05-23 NOTE — Telephone Encounter (Signed)
 Please advise [facility]

## 2024-05-27 ENCOUNTER — Other Ambulatory Visit: Payer: Self-pay

## 2024-05-27 ENCOUNTER — Encounter: Payer: Self-pay | Admitting: Emergency Medicine

## 2024-05-27 ENCOUNTER — Emergency Department
Admission: EM | Admit: 2024-05-27 | Discharge: 2024-05-27 | Disposition: A | Discharge status: Home / Self Care | Attending: Emergency Medicine | Admitting: Emergency Medicine

## 2024-05-27 ENCOUNTER — Encounter (HOSPITAL_COMMUNITY): Payer: Self-pay

## 2024-05-27 ENCOUNTER — Emergency Department

## 2024-05-27 ENCOUNTER — Encounter (HOSPITAL_COMMUNITY): Payer: Self-pay | Admitting: Internal Medicine

## 2024-05-27 ENCOUNTER — Inpatient Hospital Stay (HOSPITAL_COMMUNITY)
Admit: 2024-05-27 | Discharge: 2024-06-02 | DRG: 812 | Disposition: A | Discharge status: Home / Self Care | Source: Other Acute Inpatient Hospital | Attending: Internal Medicine | Admitting: Internal Medicine

## 2024-05-27 DIAGNOSIS — G894 Chronic pain syndrome: Secondary | ICD-10-CM | POA: Diagnosis present

## 2024-05-27 DIAGNOSIS — R300 Dysuria: Secondary | ICD-10-CM | POA: Diagnosis present

## 2024-05-27 DIAGNOSIS — Z79899 Other long term (current) drug therapy: Secondary | ICD-10-CM | POA: Diagnosis not present

## 2024-05-27 DIAGNOSIS — D638 Anemia in other chronic diseases classified elsewhere: Secondary | ICD-10-CM | POA: Diagnosis present

## 2024-05-27 DIAGNOSIS — I959 Hypotension, unspecified: Secondary | ICD-10-CM | POA: Diagnosis not present

## 2024-05-27 DIAGNOSIS — F411 Generalized anxiety disorder: Secondary | ICD-10-CM | POA: Diagnosis present

## 2024-05-27 DIAGNOSIS — D5700 Hb-SS disease with crisis, unspecified: Secondary | ICD-10-CM | POA: Diagnosis present

## 2024-05-27 DIAGNOSIS — Z7951 Long term (current) use of inhaled steroids: Secondary | ICD-10-CM | POA: Diagnosis not present

## 2024-05-27 DIAGNOSIS — D571 Sickle-cell disease without crisis: Secondary | ICD-10-CM | POA: Diagnosis present

## 2024-05-27 DIAGNOSIS — F4310 Post-traumatic stress disorder, unspecified: Secondary | ICD-10-CM | POA: Diagnosis present

## 2024-05-27 DIAGNOSIS — Z9049 Acquired absence of other specified parts of digestive tract: Secondary | ICD-10-CM

## 2024-05-27 DIAGNOSIS — J45909 Unspecified asthma, uncomplicated: Secondary | ICD-10-CM | POA: Diagnosis present

## 2024-05-27 DIAGNOSIS — Z7401 Bed confinement status: Secondary | ICD-10-CM | POA: Diagnosis not present

## 2024-05-27 DIAGNOSIS — Z833 Family history of diabetes mellitus: Secondary | ICD-10-CM | POA: Diagnosis not present

## 2024-05-27 DIAGNOSIS — F329 Major depressive disorder, single episode, unspecified: Secondary | ICD-10-CM | POA: Diagnosis present

## 2024-05-27 DIAGNOSIS — R042 Hemoptysis: Secondary | ICD-10-CM | POA: Diagnosis present

## 2024-05-27 DIAGNOSIS — J4520 Mild intermittent asthma, uncomplicated: Secondary | ICD-10-CM | POA: Diagnosis present

## 2024-05-27 LAB — COMPREHENSIVE METABOLIC PANEL WITH GFR
ALT: 20 U/L (ref 0–44)
AST: 34 U/L (ref 15–41)
Albumin: 4.5 g/dL (ref 3.5–5.0)
Alkaline Phosphatase: 135 U/L — ABNORMAL HIGH (ref 38–126)
Anion gap: 15 (ref 5–15)
BUN: 7 mg/dL (ref 6–20)
CO2: 19 mmol/L — ABNORMAL LOW (ref 22–32)
Calcium: 9.7 mg/dL (ref 8.9–10.3)
Chloride: 103 mmol/L (ref 98–111)
Creatinine, Ser: 0.52 mg/dL (ref 0.44–1.00)
GFR, Estimated: >60 mL/min
Glucose, Bld: 82 mg/dL (ref 70–99)
Potassium: 4 mmol/L (ref 3.5–5.1)
Sodium: 137 mmol/L (ref 135–145)
Total Bilirubin: 4.4 mg/dL — ABNORMAL HIGH (ref 0.0–1.2)
Total Protein: 8.5 g/dL — ABNORMAL HIGH (ref 6.5–8.1)

## 2024-05-27 LAB — HIV ANTIBODY (ROUTINE TESTING W REFLEX): HIV Screen 4th Generation wRfx: NONREACTIVE

## 2024-05-27 MED ORDER — HYDROMORPHONE 1 MG/ML IV SOLN
INTRAVENOUS | Status: DC
  Administered 2024-05-27: 6 mg via INTRAVENOUS
  Administered 2024-05-27: 30 mg via INTRAVENOUS
  Administered 2024-05-28: 11 mg via INTRAVENOUS
  Administered 2024-05-28: 30 mg via INTRAVENOUS
  Administered 2024-05-28: 4 mg via INTRAVENOUS
  Administered 2024-05-28: 9 mg via INTRAVENOUS
  Administered 2024-05-28: 1.5 mg via INTRAVENOUS
  Administered 2024-05-28: 6.5 mg via INTRAVENOUS
  Administered 2024-05-28: 12.5 mg via INTRAVENOUS
  Administered 2024-05-29: 13 mg via INTRAVENOUS
  Administered 2024-05-29: 30 mg via INTRAVENOUS
  Administered 2024-05-29: 16.5 mg via INTRAVENOUS
  Administered 2024-05-29: 30 mg via INTRAVENOUS
  Administered 2024-05-29: 9.5 mg via INTRAVENOUS
  Administered 2024-05-30: 30 mg via INTRAVENOUS
  Administered 2024-05-30: 2 mg via INTRAVENOUS
  Administered 2024-05-30: 5 mg via INTRAVENOUS
  Administered 2024-05-30: 12.5 mg via INTRAVENOUS
  Administered 2024-05-30: 2 mg via INTRAVENOUS
  Administered 2024-05-31: 5.5 mg via INTRAVENOUS
  Administered 2024-05-31: 9 mg via INTRAVENOUS
  Administered 2024-05-31: 8 mg via INTRAVENOUS
  Administered 2024-05-31: 6 mg via INTRAVENOUS
  Administered 2024-05-31: 4 mg via INTRAVENOUS
  Administered 2024-05-31: 1.5 mg via INTRAVENOUS
  Administered 2024-05-31: 5 mg via INTRAVENOUS
  Administered 2024-06-01: 9 mg via INTRAVENOUS
  Administered 2024-06-01: 0.5 mg via INTRAVENOUS
  Administered 2024-06-01: 4 mg via INTRAVENOUS
  Administered 2024-06-01: 12 mg via INTRAVENOUS
  Administered 2024-06-01: 30 mg via INTRAVENOUS
  Administered 2024-06-01: 5 mg via INTRAVENOUS
  Administered 2024-06-02: 8 mg via INTRAVENOUS
  Administered 2024-06-02: 12 mg via INTRAVENOUS
  Administered 2024-06-02: 10 mg via INTRAVENOUS
  Administered 2024-06-02: 30 mg via INTRAVENOUS
  Administered 2024-06-02: 12.5 mg via INTRAVENOUS
  Filled 2024-05-27 (×9): qty 30

## 2024-05-27 MED ORDER — SODIUM CHLORIDE 0.9 % IV SOLN
25.0000 mg | Freq: Once | INTRAVENOUS | Status: DC
  Filled 2024-05-27: qty 1

## 2024-05-27 MED ORDER — ONDANSETRON HCL 4 MG/2ML IJ SOLN: 4.0000 mg | Freq: Four times a day (QID) | INTRAMUSCULAR | Status: DC | PRN

## 2024-05-27 MED ORDER — HYDROMORPHONE 1 MG/ML IV SOLN
INTRAVENOUS | Status: DC
  Filled 2024-05-27: qty 30

## 2024-05-27 MED ORDER — DIPHENHYDRAMINE HCL 25 MG PO CAPS
25.0000 mg | ORAL_CAPSULE | ORAL | Status: DC | PRN
  Administered 2024-05-27: 25 mg via ORAL
  Filled 2024-05-27: qty 1

## 2024-05-27 MED ORDER — HYDROMORPHONE HCL 1 MG/ML IJ SOLN
2.0000 mg | INTRAMUSCULAR | Status: DC | PRN
  Administered 2024-05-27: 2 mg via INTRAVENOUS
  Filled 2024-05-27: qty 2

## 2024-05-27 MED ORDER — POLYETHYLENE GLYCOL 3350 17 G PO PACK: 17.0000 g | PACK | Freq: Every day | ORAL | Status: DC | PRN

## 2024-05-27 MED ORDER — SODIUM CHLORIDE 0.9% FLUSH: 9.0000 mL | INTRAVENOUS | Status: DC | PRN

## 2024-05-27 MED ORDER — PREGABALIN 75 MG PO CAPS
150.0000 mg | ORAL_CAPSULE | Freq: Three times a day (TID) | ORAL | Status: DC
  Administered 2024-05-27 – 2024-06-02 (×18): 150 mg via ORAL
  Filled 2024-05-27 (×18): qty 2

## 2024-05-27 MED ORDER — HYDROMORPHONE HCL 1 MG/ML IJ SOLN
2.0000 mg | Freq: Once | INTRAMUSCULAR | Status: AC
  Administered 2024-05-27: 2 mg via INTRAVENOUS
  Filled 2024-05-27: qty 2

## 2024-05-27 MED ORDER — SENNOSIDES-DOCUSATE SODIUM 8.6-50 MG PO TABS
1.0000 | ORAL_TABLET | Freq: Two times a day (BID) | ORAL | Status: DC
  Administered 2024-05-27 – 2024-06-02 (×12): 1 via ORAL
  Filled 2024-05-27 (×12): qty 1

## 2024-05-27 MED ORDER — OXYCODONE HCL 5 MG PO TABS
10.0000 mg | ORAL_TABLET | Freq: Once | ORAL | Status: AC
  Administered 2024-05-27: 10 mg via ORAL
  Filled 2024-05-27: qty 2

## 2024-05-27 MED ORDER — SODIUM CHLORIDE 0.45 % IV SOLN: INTRAVENOUS | Status: AC

## 2024-05-27 MED ORDER — ENOXAPARIN SODIUM 40 MG/0.4ML IJ SOSY: 40.0000 mg | PREFILLED_SYRINGE | INTRAMUSCULAR | Status: DC

## 2024-05-27 MED ORDER — OXYCODONE HCL 5 MG PO TABS
10.0000 mg | ORAL_TABLET | Freq: Four times a day (QID) | ORAL | Status: DC | PRN
  Administered 2024-05-27: 10 mg via ORAL
  Filled 2024-05-27: qty 2

## 2024-05-27 MED ORDER — PANTOPRAZOLE SODIUM 40 MG PO TBEC
40.0000 mg | DELAYED_RELEASE_TABLET | Freq: Two times a day (BID) | ORAL | Status: DC
  Administered 2024-05-27 – 2024-06-02 (×12): 40 mg via ORAL
  Filled 2024-05-27 (×12): qty 1

## 2024-05-27 MED ORDER — HYDROMORPHONE HCL 1 MG/ML IJ SOLN
1.0000 mg | Freq: Once | INTRAMUSCULAR | Status: AC
  Administered 2024-05-27: 1 mg via INTRAVENOUS
  Filled 2024-05-27: qty 1

## 2024-05-27 MED ORDER — LACTATED RINGERS IV BOLUS
1000.0000 mL | Freq: Once | INTRAVENOUS | Status: AC
  Administered 2024-05-27: 1000 mL via INTRAVENOUS

## 2024-05-27 MED ORDER — DIPHENHYDRAMINE HCL 50 MG/ML IJ SOLN
25.0000 mg | Freq: Once | INTRAMUSCULAR | Status: AC
  Administered 2024-05-27: 25 mg via INTRAVENOUS
  Filled 2024-05-27: qty 1

## 2024-05-27 MED ORDER — KETOROLAC TROMETHAMINE 30 MG/ML IJ SOLN
15.0000 mg | Freq: Once | INTRAMUSCULAR | Status: AC
  Administered 2024-05-27: 15 mg via INTRAVENOUS
  Filled 2024-05-27: qty 1

## 2024-05-27 MED ORDER — PREGABALIN 50 MG PO CAPS
150.0000 mg | ORAL_CAPSULE | Freq: Three times a day (TID) | ORAL | Status: DC
  Administered 2024-05-27: 150 mg via ORAL
  Filled 2024-05-27 (×2): qty 3

## 2024-05-27 MED ORDER — HYDROXYUREA 500 MG PO CAPS
1500.0000 mg | ORAL_CAPSULE | Freq: Every day | ORAL | Status: DC
  Administered 2024-05-27: 1500 mg via ORAL
  Filled 2024-05-27: qty 3

## 2024-05-27 MED ORDER — KETOROLAC TROMETHAMINE 15 MG/ML IJ SOLN
15.0000 mg | Freq: Four times a day (QID) | INTRAMUSCULAR | Status: DC
  Administered 2024-05-27: 15 mg via INTRAVENOUS
  Filled 2024-05-27: qty 1

## 2024-05-27 MED ORDER — NALOXONE HCL 0.4 MG/ML IJ SOLN: 0.4000 mg | INTRAMUSCULAR | Status: DC | PRN

## 2024-05-27 MED ORDER — KETOROLAC TROMETHAMINE 15 MG/ML IJ SOLN
15.0000 mg | Freq: Four times a day (QID) | INTRAMUSCULAR | Status: DC
  Administered 2024-05-27 – 2024-05-31 (×13): 15 mg via INTRAVENOUS
  Filled 2024-05-27 (×13): qty 1

## 2024-05-27 MED ORDER — ENOXAPARIN SODIUM 40 MG/0.4ML IJ SOSY
40.0000 mg | PREFILLED_SYRINGE | INTRAMUSCULAR | Status: DC
  Filled 2024-05-27: qty 0.4

## 2024-05-27 MED ORDER — DIPHENHYDRAMINE HCL 25 MG PO CAPS
25.0000 mg | ORAL_CAPSULE | ORAL | Status: DC | PRN
  Administered 2024-05-27 – 2024-05-31 (×6): 25 mg via ORAL
  Filled 2024-05-27 (×7): qty 1

## 2024-05-27 MED ORDER — ONDANSETRON HCL 4 MG/2ML IJ SOLN
4.0000 mg | Freq: Four times a day (QID) | INTRAMUSCULAR | Status: DC | PRN
  Administered 2024-05-29 – 2024-06-02 (×7): 4 mg via INTRAVENOUS
  Filled 2024-05-27 (×7): qty 2

## 2024-05-27 MED ORDER — LUBIPROSTONE 24 MCG PO CAPS: 24.0000 ug | ORAL_CAPSULE | Freq: Every day | ORAL | Status: DC

## 2024-05-27 MED ORDER — SENNOSIDES-DOCUSATE SODIUM 8.6-50 MG PO TABS: 1.0000 | ORAL_TABLET | Freq: Two times a day (BID) | ORAL | Status: DC

## 2024-05-27 MED ORDER — HYDROXYUREA 500 MG PO CAPS
1500.0000 mg | ORAL_CAPSULE | Freq: Every day | ORAL | Status: DC
  Administered 2024-05-28 – 2024-05-30 (×3): 1500 mg via ORAL
  Filled 2024-05-27 (×3): qty 3

## 2024-05-27 MED ORDER — PANTOPRAZOLE SODIUM 40 MG PO TBEC
40.0000 mg | DELAYED_RELEASE_TABLET | Freq: Every day | ORAL | Status: DC
  Administered 2024-05-27: 40 mg via ORAL
  Filled 2024-05-27: qty 1

## 2024-05-27 MED ORDER — SODIUM CHLORIDE 0.45 % IV SOLN: INTRAVENOUS | Status: DC

## 2024-05-27 MED ORDER — OXYCODONE HCL 5 MG PO TABS
10.0000 mg | ORAL_TABLET | Freq: Four times a day (QID) | ORAL | Status: DC | PRN
  Administered 2024-05-27 – 2024-06-02 (×14): 10 mg via ORAL
  Filled 2024-05-27 (×14): qty 2

## 2024-05-27 NOTE — ED Notes (Signed)
 Pt to ED via EMS from home, pt took oxycodone  at 0100 with no relief. Pt has hx sickle cell

## 2024-05-27 NOTE — Plan of Care (Signed)
  Problem: Education: Goal: Knowledge of General Education information will improve Description: Including pain rating scale, medication(s)/side effects and non-pharmacologic comfort measures Outcome: Progressing   Problem: Health Behavior/Discharge Planning: Goal: Ability to manage health-related needs will improve Outcome: Progressing   Problem: Clinical Measurements: Goal: Ability to maintain clinical measurements within normal limits will improve Outcome: Progressing Goal: Will remain free from infection Outcome: Progressing Goal: Diagnostic test results will improve Outcome: Progressing Goal: Respiratory complications will improve Outcome: Progressing Goal: Cardiovascular complication will be avoided Outcome: Progressing   Problem: Activity: Goal: Risk for activity intolerance will decrease Outcome: Progressing   Problem: Nutrition: Goal: Adequate nutrition will be maintained Outcome: Progressing   Problem: Coping: Goal: Level of anxiety will decrease Outcome: Progressing   Problem: Safety: Goal: Ability to remain free from injury will improve Outcome: Progressing   

## 2024-05-27 NOTE — ED Provider Notes (Signed)
 Patient received another dose of pain medication, and total 3 separate doses of IV Dilaudid , Phenergan , Toradol , fluids  At this time she continues to to complain of significant pain.   she will require admission, will contact [facility] hospitalist   Arlander Charleston, MD 05/27/24 1009

## 2024-05-27 NOTE — ED Triage Notes (Signed)
 Pt arrives via ems c/o sickle cell pain crisis everywhere including chest, worst in bilateral legs. Last oxycodone  dose 2 hours ago.

## 2024-05-27 NOTE — ED Notes (Addendum)
 Pt reports recent sickness and has not been able to control her pain since illness. Unsuccessful IV attempt and pt difficult stick for blood draw. Red, blue, lav, and green collected and sent to lab.

## 2024-05-27 NOTE — H&P (Signed)
 H&P  Patient Demographics:  Bailey Reese, is a 19 y.o. female  MRN: [id number]   DOB - [birth year]  Admit Date - 05/27/2024  Outpatient Primary MD for the patient is Oley Bascom RAMAN, NP   HPI:   Bailey Reese  is a 19 y.o. female with medical history that is significant for sickle cell disease, PTSD, major depression and generalized anxiety disorder, chronic pain syndrome, and anemia of chronic disease who usually gets her medical care at [facility] but in the process of transferring her care to [facility] health, was seen earlier this morning at [facility] emergency room for major complaints of generalized bodyaches that is typical of her sickle cell pain crisis.  She took her home pain medications at 1 AM with no sustained relief.  Pain is worse in her lower extremities bilaterally, occasional chest pain but no shortness of breath.  Patient was last discharged from the hospital on 04/22/2024.  She is rating her pain at 10/10, constant and throbbing.  She reports no inciting factor but attributes current crisis to changing weather.  She denies any fever, cough, shortness of breath, nausea, vomiting or diarrhea.  No urinary symptoms.  She denies any recent travels, sick contacts, or known exposure to COVID-19/the flu.  ED course: Patient was seen to be resting comfortably, awake, in no distress.  Vital signs showed: Heart rate of 93, respiration 16, temperature 98.9 F, SpO2 94% on room air, with a recorded blood pressure of 156/66.  Immediate labs showed: Unremarkable comprehensive metabolic panel except for total bilirubin of 4.4 which is slightly lower than her baseline.  Her CBC with differential is pending.  She received multiple doses of IV Dilaudid  and other adjunct therapies in the emergency room with no relief of sickle cell pain.  Patient will be admitted to [facility] for further evaluation and management of sickle cell pain crisis.   Review of systems:  In addition to the HPI above,  patient reports No fever or chills No Headache, No changes with vision or hearing No problems swallowing food or liquids No chest pain, cough or shortness of breath No abdominal pain, No nausea or vomiting, Bowel movements are regular No blood in stool or urine No dysuria No new skin rashes or bruises No new joints pains-aches No new weakness, tingling, numbness in any extremity No recent weight gain or loss No polyuria, polydypsia or polyphagia No significant Mental Stressors  A full 10 point Review of Systems was done, except as stated above, all other Review of Systems were negative.  With Past History of the following :   Past Medical History:  Diagnosis Date   Anxiety    Depression    PTSD (post-traumatic stress disorder)    Sickle cell anemia (HCC)       Past Surgical History:  Procedure Laterality Date   BIOPSY  07/04/2023   Procedure: BIOPSY;  Surgeon: Saintclair Jasper, MD;  Location: [facility]COPY;  Service: Gastroenterology;;   CHOLECYSTECTOMY     ESOPHAGOGASTRODUODENOSCOPY (EGD) WITH PROPOFOL  N/A 07/04/2023   Procedure: ESOPHAGOGASTRODUODENOSCOPY (EGD) WITH PROPOFOL ;  Surgeon: Saintclair Jasper, MD;  Location: [facility];  Service: Gastroenterology;  Laterality: N/A;   WRIST SURGERY       Social History:   Social History   Tobacco Use   Smoking status: Never   Smokeless tobacco: Never  Substance Use Topics   Alcohol  use: No     Lives - At home   Family History :   Family  History  Problem Relation Age of Onset   Diabetes Paternal Aunt      Home Medications:   Prior to Admission medications  Medication Sig Start Date End Date Taking? Authorizing Provider  budesonide-formoterol  (SYMBICORT) 80-4.5 MCG/ACT inhaler Inhale 2 puffs into the lungs in the morning and at bedtime. 12/29/22 04/17/24  [provider]  Calcium Carb-Cholecalciferol (CALCIUM 600+D3 PO) Take 2 tablets by mouth daily.    [provider]  ergocalciferol  (VITAMIN D2) 1.25 MG  (50000 UT) capsule Take 1 capsule (50,000 Units total) by mouth once a week. Patient taking differently: Take 50,000 Units by mouth every Wednesday. 02/12/24   Oley Bascom RAMAN, NP  esomeprazole  (NEXIUM ) 40 MG capsule Take 1 capsule (40 mg total) by mouth daily as needed (reflux). 08/29/23   Ijaola, Onyeje M, NP  fexofenadine (ALLEGRA) 180 MG tablet Take 180 mg by mouth in the morning. 12/29/22   [provider]  hydroxyurea  (HYDREA ) 500 MG capsule Take 1,500 mg by mouth daily. May take with food to minimize GI side effects.    [provider]  hydrOXYzine  (ATARAX ) 25 MG tablet Take 25 mg by mouth See admin instructions. Take 25 mg by mouth twice a day and an additional 25 mg once a day as needed for anxiety    [provider]  lubiprostone  (AMITIZA ) 24 MCG capsule Take 24 mcg by mouth in the morning and at bedtime. 11/12/23   [provider]  ondansetron  (ZOFRAN ) 4 MG tablet Take 4 mg by mouth See admin instructions. Take 4 mg by mouth up to 4 times a day 12/29/23   [provider]  Oxycodone  HCl 10 MG TABS Take 1 tablet (10 mg total) by mouth every 6 (six) hours as needed (pain). 05/23/24   Oley Bascom RAMAN, NP  pantoprazole  (PROTONIX ) 40 MG tablet Take 1 tablet (40 mg total) by mouth 2 (two) times daily. Patient taking differently: Take 40 mg by mouth daily as needed (reflux). 07/05/23 07/04/24  Sim Emery CROME, MD  prazosin  (MINIPRESS ) 1 MG capsule Take 1 mg by mouth at bedtime.    [provider]  pregabalin  (LYRICA ) 150 MG capsule Take 1 capsule (150 mg total) by mouth 3 (three) times daily. 05/01/24   Oley Bascom RAMAN, NP  scopolamine  (TRANSDERM-SCOP) 1 MG/3DAYS Place 1 patch onto the skin every 3 (three) days. Patient not taking: Reported on 04/17/2024 01/03/24   [provider]     Allergies:   Allergies[1]   Physical Exam:   Vitals:   There were no vitals filed for this visit.  Physical Exam: Constitutional: Patient appears  well-developed and well-nourished. Not in obvious distress. HENT: Normocephalic, atraumatic, External right and left ear normal. Oropharynx is clear and moist.  Eyes: Conjunctivae and EOM are normal. PERRLA, no scleral icterus. Neck: Normal ROM. Neck supple. No JVD. No tracheal deviation. No thyromegaly. CVS: RRR, S1/S2 +, no murmurs, no gallops, no carotid bruit.  Pulmonary: Effort and breath sounds normal, no stridor, rhonchi, wheezes, rales.  Abdominal: Soft. BS +, no distension, tenderness, rebound or guarding.  Musculoskeletal: Normal range of motion. No edema and no tenderness.  Lymphadenopathy: No lymphadenopathy noted, cervical, inguinal or axillary Neuro: Alert. Normal reflexes, muscle tone coordination. No cranial nerve deficit. Skin: Skin is warm and dry. No rash noted. Not diaphoretic. No erythema. No pallor. Psychiatric: Normal mood and affect. Behavior, judgment, thought content normal.   Data Review:   CBC No results for input(s): WBC, HGB, HCT, PLT, MCV, MCH,  MCHC, RDW, LYMPHSABS, MONOABS, EOSABS, BASOSABS, BANDABS in the last 168 hours.  Invalid input(s): NEUTRABS, BANDSABD ------------------------------------------------------------------------------------------------------------------  Chemistries  Recent Labs  Lab 05/27/24 0308  NA 137  K 4.0  CL 103  CO2 19*  GLUCOSE 82  BUN 7  CREATININE 0.52  CALCIUM 9.7  AST 34  ALT 20  ALKPHOS 135*  BILITOT 4.4*   ------------------------------------------------------------------------------------------------------------------ estimated creatinine clearance is 107.8 mL/min (by C-G formula based on SCr of 0.52 mg/dL). ------------------------------------------------------------------------------------------------------------------ No results for input(s): TSH, T4TOTAL, T3FREE, THYROIDAB in the last 72 hours.  Invalid input(s): FREET3  Coagulation profile No results for  input(s): INR, PROTIME in the last 168 hours. ------------------------------------------------------------------------------------------------------------------- No results for input(s): DDIMER in the last 72 hours. -------------------------------------------------------------------------------------------------------------------  Cardiac Enzymes No results for input(s): CKMB, TROPONINI, MYOGLOBIN in the last 168 hours.  Invalid input(s): CK ------------------------------------------------------------------------------------------------------------------    Component Value Date/Time   BNP 22.4 12/01/2023 2047    ---------------------------------------------------------------------------------------------------------------  Urinalysis    Component Value Date/Time   COLORURINE YELLOW 02/21/2024 1130   APPEARANCEUR CLEAR 02/21/2024 1130   APPEARANCEUR Clear 05/01/2013 1440   LABSPEC 1.010 02/21/2024 1130   LABSPEC 1.016 05/01/2013 1440   PHURINE 6.0 02/21/2024 1130   GLUCOSEU NEGATIVE 02/21/2024 1130   GLUCOSEU Negative 05/01/2013 1440   HGBUR NEGATIVE 02/21/2024 1130   BILIRUBINUR NEGATIVE 02/21/2024 1130   BILIRUBINUR Negative 05/01/2013 1440   KETONESUR NEGATIVE 02/21/2024 1130   PROTEINUR NEGATIVE 02/21/2024 1130   NITRITE NEGATIVE 02/21/2024 1130   LEUKOCYTESUR NEGATIVE 02/21/2024 1130   LEUKOCYTESUR Negative 05/01/2013 1440    ----------------------------------------------------------------------------------------------------------------   Imaging Results:    DG Chest 2 View Result Date: 05/27/2024 EXAM: 2 VIEW(S) XRAY OF THE CHEST 05/27/2024 03:34:00 AM COMPARISON: 04/16/2024 CLINICAL HISTORY: Chest pain FINDINGS: LUNGS AND PLEURA: Bibasilar linear atelectasis/scarring. No pleural effusion. No pneumothorax. HEART AND MEDIASTINUM: No acute abnormality of the cardiac and mediastinal silhouettes. BONES AND SOFT TISSUES: Right upper quadrant surgical clips  noted. No acute osseous abnormality. IMPRESSION: 1. No acute cardiopulmonary abnormality. Electronically signed by: Pinkie Pebbles MD MD 05/27/2024 03:38 AM EST RP Workstation: [id number]     Assessment & Plan:  Principal Problem:   Sickle cell anemia with crisis (HCC) Active Problems:   Chronic pain syndrome   GAD (generalized anxiety disorder)   Mild intermittent asthma without complication   Anemia of chronic disease  Hb Sickle Cell Disease with crisis: Admit patient, start IVF 0.45% Saline @ 125 mls/hour, start weight based Dilaudid  PCA, start IV Toradol  15 mg Q6H for a total of 5 days.  Restart oral home pain medications, Monitor vitals very closely, Re-evaluate pain scale regularly, 2 L of Oxygen by [place], Patient will be re-evaluated for pain in the context of function and relationship to baseline as care progresses. Anemia of Chronic Disease: CBC is pending.  Hemoglobin usually at baseline of about 7.4.  Will review results as they become available and will transfuse as appropriate. Chronic pain Syndrome: Restart and continue oral home pain medications. Generalized anxiety disorder: Clinically stable.  Patient denies any suicidal ideations or thoughts.  Will continue with her home medications.  Patient counseled. Mild intermittent asthma without complication: Clinically stable.  Will continue home medications.  DVT Prophylaxis: Subcut Lovenox    AM Labs Ordered, also please review Full Orders  Family Communication: Admission, patient's condition and plan of care including tests being ordered have been discussed with the patient who indicate understanding and agree with the plan and Code Status.  Code Status: Full Code  Consults  called: None    Admission status: Inpatient    Time spent in minutes : 50 minutes  Eyana Stolze  05/27/2024 at 5:21 PM    [1]  Allergies Allergen Reactions   Cherry Anaphylaxis, Itching and Swelling   Droperidol  Anxiety and Other (See  Comments)    Droperidol  is a medication that prevents nausea and vomiting caused by surgical procedures. The brand name of this medication is Inapsine . = Pt states muscle tension, involuntary movements, eye drifting, and SEIZURES   Olanzapine  Other (See Comments)    Drug-induced liver injury   Other Anaphylaxis, Itching, Swelling and Other (See Comments)    Pitted fruits - Throat itches  ALSO- THE PATIENT IS PRONE TO HAVE SEIZURES.   Peanut-Containing Drug Products Anaphylaxis, Itching and Swelling   Plum Pulp Anaphylaxis and Hives   Morphine  And Codeine Hives and Itching   Peach [Prunus Persica] Itching, Swelling and Other (See Comments)   Acetaminophen  Nausea And Vomiting   Compazine  [Prochlorperazine ] Other (See Comments) and Hypertension    Patient stated that this medication causes her HR to increase and well as BP. She also states that it cause hallucinations.   Ibuprofen  Nausea And Vomiting

## 2024-05-27 NOTE — ED Notes (Signed)
 Called [facility] spoke to Nataya for transfer to [facility]  [phone]

## 2024-05-27 NOTE — ED Notes (Signed)
 EMTALA reviewed by this RN. Paper consent in chart.

## 2024-05-27 NOTE — ED Notes (Signed)
 Paper consent signed by patient and Rock, RN.

## 2024-05-27 NOTE — ED Provider Notes (Signed)
 "  [facility] Provider Note    Event Date/Time   First MD Initiated Contact with Patient 05/27/24 0455     (approximate)   History   Sickle Cell Pain Crisis   HPI  Bailey Reese is a 19 y.o. female who presents to the ED for evaluation of Sickle Cell Pain Crisis   I reviewed medical DC summary from last month.  Admitted for sickle cell pain crisis.  Hemorrhagic stroke 2017, chronic pain syndrome and sickle cell disease.  Patient presents with diffuse pain and concerns for sickle cell pain crisis.  No fevers.   Physical Exam   Triage Vital Signs: ED Triage Vitals  Encounter Vitals Group     BP 05/27/24 0247 (!) 156/66     Girls Systolic BP Percentile --      Girls Diastolic BP Percentile --      Boys Systolic BP Percentile --      Boys Diastolic BP Percentile --      Pulse Rate 05/27/24 0247 93     Resp 05/27/24 0247 16     Temp 05/27/24 0247 98.9 F (37.2 C)     Temp Source 05/27/24 0247 Oral     SpO2 05/27/24 0247 94 %     Weight 05/27/24 0248 155 lb (70.3 kg)     Height 05/27/24 0248 5' 4 (1.626 m)     Head Circumference --      Peak Flow --      Pain Score 05/27/24 0304 9     Pain Loc --      Pain Education --      Exclude from Growth Chart --     Most recent vital signs: Vitals:   05/27/24 0247  BP: (!) 156/66  Pulse: 93  Resp: 16  Temp: 98.9 F (37.2 C)  SpO2: 94%    General: Awake, no distress.  CV:  Good peripheral perfusion.  Resp:  Normal effort.  Abd:  No distention.  MSK:  No deformity noted.  Neuro:  No focal deficits appreciated. Other:     ED Results / Procedures / Treatments   Labs (all labs ordered are listed, but only abnormal results are displayed) Labs Reviewed  COMPREHENSIVE METABOLIC PANEL WITH GFR - Abnormal; Notable for the following components:      Result Value   CO2 19 (*)    Total Protein 8.5 (*)    Alkaline Phosphatase 135 (*)    Total Bilirubin 4.4 (*)    All other components  within normal limits  CBC WITH DIFFERENTIAL/PLATELET  CBC WITH DIFFERENTIAL/PLATELET  RETICULOCYTES  POC URINE PREG, ED    EKG Sinus rhythm with a rate of 100 bpm, no STEMI, nonspecific ST changes  RADIOLOGY CXR interpreted by me without evidence of acute cardiopulmonary pathology.  Official radiology report(s): DG Chest 2 View Result Date: 05/27/2024 EXAM: 2 VIEW(S) XRAY OF THE CHEST 05/27/2024 03:34:00 AM COMPARISON: 04/16/2024 CLINICAL HISTORY: Chest pain FINDINGS: LUNGS AND PLEURA: Bibasilar linear atelectasis/scarring. No pleural effusion. No pneumothorax. HEART AND MEDIASTINUM: No acute abnormality of the cardiac and mediastinal silhouettes. BONES AND SOFT TISSUES: Right upper quadrant surgical clips noted. No acute osseous abnormality. IMPRESSION: 1. No acute cardiopulmonary abnormality. Electronically signed by: Pinkie Pebbles MD MD 05/27/2024 03:38 AM EST RP Workstation: [id number]    PROCEDURES and INTERVENTIONS:  Procedures  Medications  promethazine  (PHENERGAN ) 25 mg in sodium chloride  0.9 % 50 mL IVPB (has no administration in time range)  HYDROmorphone  (DILAUDID )  injection 2 mg (2 mg Intravenous Given 05/27/24 0526)  ketorolac  (TORADOL ) 30 MG/ML injection 15 mg (15 mg Intravenous Given 05/27/24 0525)  lactated ringers  bolus 1,000 mL (1,000 mLs Intravenous New Bag/Given 05/27/24 0525)  oxyCODONE  (Oxy IR/ROXICODONE ) immediate release tablet 10 mg (10 mg Oral Given 05/27/24 9386)  HYDROmorphone  (DILAUDID ) injection 2 mg (2 mg Intravenous Given 05/27/24 0612)  diphenhydrAMINE  (BENADRYL ) injection 25 mg (25 mg Intravenous Given 05/27/24 0612)     IMPRESSION / MDM / ASSESSMENT AND PLAN / ED COURSE  I reviewed the triage vital signs and the nursing notes.  Differential diagnosis includes, but is not limited to, chronic pain syndrome, sickle cell pain crisis, aplastic anemia, acute chest  {Patient presents with symptoms of an acute illness or injury that is potentially  life-threatening.  Patient presents with sickle cell pain crisis.  Awaiting blood work to assess for aplastic crisis.  Clear CXR      FINAL CLINICAL IMPRESSION(S) / ED DIAGNOSES   Final diagnoses:  None     Rx / DC Orders   ED Discharge Orders     None        Note:  This document was prepared using Dragon voice recognition software and may include unintentional dictation errors.   Claudene Rover, MD 05/27/24 [phone]  "

## 2024-05-27 NOTE — H&P (Signed)
 Error

## 2024-05-27 NOTE — ED Notes (Signed)
Additional pain meds given   

## 2024-05-27 NOTE — ED Notes (Signed)
 States she is will to try some PO  Apple juice and crackers given

## 2024-05-28 DIAGNOSIS — R300 Dysuria: Secondary | ICD-10-CM | POA: Insufficient documentation

## 2024-05-28 LAB — RETICULOCYTES
Immature Retic Fract: 21.8 % — ABNORMAL HIGH (ref 2.3–15.9)
RBC.: 1.95 MIL/uL — ABNORMAL LOW (ref 3.87–5.11)
Retic Count, Absolute: 436.2 K/uL — ABNORMAL HIGH (ref 19.0–186.0)
Retic Ct Pct: 22.4 % — ABNORMAL HIGH (ref 0.4–3.1)

## 2024-05-28 LAB — RESPIRATORY PANEL BY PCR

## 2024-05-28 LAB — CBC WITH DIFFERENTIAL/PLATELET
Abs Immature Granulocytes: 0.05 K/uL (ref 0.00–0.07)
Basophils Absolute: 0.1 K/uL (ref 0.0–0.1)
Basophils Relative: 1 %
Eosinophils Absolute: 0.5 K/uL (ref 0.0–0.5)
Eosinophils Relative: 5 %
HCT: 17.7 % — ABNORMAL LOW (ref 36.0–46.0)
Hemoglobin: 6.2 g/dL — CL (ref 12.0–15.0)
Immature Granulocytes: 1 %
Lymphocytes Relative: 28 %
Lymphs Abs: 2.8 K/uL (ref 0.7–4.0)
MCH: 32 pg (ref 26.0–34.0)
MCHC: 35 g/dL (ref 30.0–36.0)
MCV: 91.2 fL (ref 80.0–100.0)
Monocytes Absolute: 1.7 K/uL — ABNORMAL HIGH (ref 0.1–1.0)
Monocytes Relative: 17 %
Neutro Abs: 4.9 K/uL (ref 1.7–7.7)
Neutrophils Relative %: 48 %
Platelets: 503 K/uL — ABNORMAL HIGH (ref 150–400)
RBC: 1.94 MIL/uL — ABNORMAL LOW (ref 3.87–5.11)
RDW: 19.7 % — ABNORMAL HIGH (ref 11.5–15.5)
WBC: 10 K/uL (ref 4.0–10.5)
nRBC: 1.4 % — ABNORMAL HIGH (ref 0.0–0.2)

## 2024-05-28 LAB — URINALYSIS, COMPLETE (UACMP) WITH MICROSCOPIC
Bilirubin Urine: NEGATIVE
Glucose, UA: NEGATIVE mg/dL
Hgb urine dipstick: NEGATIVE
Ketones, ur: NEGATIVE mg/dL
Leukocytes,Ua: NEGATIVE
Nitrite: NEGATIVE
Protein, ur: NEGATIVE mg/dL
Specific Gravity, Urine: 1.01 (ref 1.005–1.030)
pH: 5 (ref 5.0–8.0)

## 2024-05-28 LAB — HEMOGLOBIN AND HEMATOCRIT, BLOOD
HCT: 22.5 % — ABNORMAL LOW (ref 36.0–46.0)
Hemoglobin: 7.9 g/dL — ABNORMAL LOW (ref 12.0–15.0)

## 2024-05-28 LAB — PREPARE RBC (CROSSMATCH)

## 2024-05-28 MED ORDER — SODIUM CHLORIDE 0.9% IV SOLUTION: Freq: Once | INTRAVENOUS | Status: AC

## 2024-05-28 MED ORDER — PHENOL 1.4 % MT LIQD
1.0000 | OROMUCOSAL | Status: DC | PRN
  Administered 2024-05-28: 1 via OROMUCOSAL
  Filled 2024-05-28: qty 177

## 2024-05-28 MED ORDER — DIPHENHYDRAMINE HCL 50 MG/ML IJ SOLN: 25.0000 mg | Freq: Once | INTRAMUSCULAR | Status: DC

## 2024-05-28 MED ORDER — MENTHOL 3 MG MT LOZG: 1.0000 | LOZENGE | OROMUCOSAL | Status: DC | PRN

## 2024-05-28 MED ORDER — GUAIFENESIN-DM 100-10 MG/5ML PO SYRP
5.0000 mL | ORAL_SOLUTION | ORAL | Status: DC | PRN
  Administered 2024-05-28: 5 mL via ORAL
  Filled 2024-05-28: qty 5

## 2024-05-28 MED ORDER — PRAZOSIN HCL 1 MG PO CAPS
1.0000 mg | ORAL_CAPSULE | Freq: Every day | ORAL | Status: DC
  Administered 2024-05-28 – 2024-06-01 (×5): 1 mg via ORAL
  Filled 2024-05-28 (×6): qty 1

## 2024-05-28 NOTE — Progress Notes (Signed)
 Per Hermelinda ADTs via email   Medicaid Peds CCM Inpatient & ED 05/28/24 (Tuesday)  Recent hospital discharge Location: [facility] Discharge date: 05/27/2024.

## 2024-05-28 NOTE — Plan of Care (Signed)

## 2024-05-28 NOTE — Progress Notes (Signed)
 Patient ID: Bailey Reese, female   DOB: 06/02/2005, 19 y.o.   MRN: [id number] Subjective: Bailey Reese  is a 19 y.o. female with medical history that is significant for sickle cell disease, PTSD, major depression and generalized anxiety disorder, chronic pain syndrome, and anemia of chronic disease who usually gets her medical care at [facility] but in the process of transferring her care to [facility], she was seen at [facility] emergency room for major complaints of generalized bodyaches that is typical of her sickle cell pain crisis.   Patient is endorsing pain of 8/10 this morning with dysuria.  She denies subjective fever, headache, shortness of breath, nausea, vomiting, diarrhea.   Objective:  Vital signs in last 24 hours:  Vitals:   05/27/24 1742 05/27/24 2015 05/28/24 0105 05/28/24 0459  BP: 107/65     Pulse: 88     Resp: 19 19 18 17   Temp: 98 F (36.7 C)     TempSrc: Oral     SpO2: 98% 95% 96% 98%  Weight: 70.3 kg     Height: 5' 4 (1.626 m)       Intake/Output from previous day:   Intake/Output Summary (Last 24 hours) at 05/28/2024 1030 Last data filed at 05/28/2024 0300 Gross per 24 hour  Intake 970.65 ml  Output --  Net 970.65 ml    Physical Exam: General: Alert, awake, oriented x3, in no acute distress.  HEENT: [place]/AT PEERL, EOMI Neck: Trachea midline,  no masses, no thyromegal,y no JVD, no carotid bruit OROPHARYNX:  Moist, No exudate/ erythema/lesions.  Heart: Regular rate and rhythm, without murmurs, rubs, gallops, PMI non-displaced, no heaves or thrills on palpation.  Lungs: Clear to auscultation, no wheezing or rhonchi noted. No increased vocal fremitus resonant to percussion  Abdomen: Soft, nontender, nondistended, positive bowel sounds, no masses no hepatosplenomegaly noted..  Neuro: No focal neurological deficits noted cranial nerves II through XII grossly intact. DTRs 2+ bilaterally upper and lower extremities. Strength 5 out of 5 in bilateral upper  and lower extremities. Musculoskeletal: Generalized body tenderness. Psychiatric: Patient alert and oriented x3, good insight and cognition, good recent to remote recall. Lymph node survey: No cervical axillary or inguinal lymphadenopathy noted.  Lab Results:  Basic Metabolic Panel:    Component Value Date/Time   NA 137 05/27/2024 0308   NA 139 02/09/2024 1433   NA 135 05/01/2013 1440   K 4.0 05/27/2024 0308   K 4.3 05/01/2013 1440   CL 103 05/27/2024 0308   CL 102 05/01/2013 1440   CO2 19 (L) 05/27/2024 0308   CO2 27 (H) 05/01/2013 1440   BUN 7 05/27/2024 0308   BUN 7 02/09/2024 1433   BUN 13 05/01/2013 1440   CREATININE 0.52 05/27/2024 0308   CREATININE 0.40 (L) 05/01/2013 1440   GLUCOSE 82 05/27/2024 0308   GLUCOSE 108 (H) 05/01/2013 1440   CALCIUM 9.7 05/27/2024 0308   CALCIUM 9.8 05/01/2013 1440   CBC:    Component Value Date/Time   WBC 10.0 05/28/2024 0727   HGB 6.2 (LL) 05/28/2024 0727   HGB 8.3 (L) 02/09/2024 1433   HCT 17.7 (L) 05/28/2024 0727   HCT 25.2 (L) 02/09/2024 1433   PLT 503 (H) 05/28/2024 0727   PLT 359 02/09/2024 1433   MCV 91.2 05/28/2024 0727   MCV 98 (H) 02/09/2024 1433   MCV 89 05/01/2013 1440   NEUTROABS 4.9 05/28/2024 0727   NEUTROABS 7.3 (H) 02/09/2024 1433   NEUTROABS 7.9 05/30/2012 1958  LYMPHSABS 2.8 05/28/2024 0727   LYMPHSABS 2.8 02/09/2024 1433   LYMPHSABS 5.0 05/30/2012 1958   MONOABS 1.7 (H) 05/28/2024 0727   MONOABS 1.6 (H) 05/30/2012 1958   EOSABS 0.5 05/28/2024 0727   EOSABS 0.3 02/09/2024 1433   EOSABS 0.6 05/30/2012 1958   BASOSABS 0.1 05/28/2024 0727   BASOSABS 0.1 02/09/2024 1433   BASOSABS 3 04/28/2013 1736   BASOSABS 0.2 (H) 05/30/2012 1958    No results found for this or any previous visit (from the past 240 hours).  Studies/Results: DG Chest 2 View Result Date: 05/27/2024 EXAM: 2 VIEW(S) XRAY OF THE CHEST 05/27/2024 03:34:00 AM COMPARISON: 04/16/2024 CLINICAL HISTORY: Chest pain FINDINGS: LUNGS AND PLEURA:  Bibasilar linear atelectasis/scarring. No pleural effusion. No pneumothorax. HEART AND MEDIASTINUM: No acute abnormality of the cardiac and mediastinal silhouettes. BONES AND SOFT TISSUES: Right upper quadrant surgical clips noted. No acute osseous abnormality. IMPRESSION: 1. No acute cardiopulmonary abnormality. Electronically signed by: Pinkie Pebbles MD MD 05/27/2024 03:38 AM EST RP Workstation: [id number]    Medications: Scheduled Meds:  sodium chloride    Intravenous Once   diphenhydrAMINE   25 mg Intravenous Once   enoxaparin  (LOVENOX ) injection  40 mg Subcutaneous Q24H   HYDROmorphone    Intravenous Q4H   hydroxyurea   1,500 mg Oral Daily   ketorolac   15 mg Intravenous Q6H   pantoprazole   40 mg Oral BID   prazosin   1 mg Oral QHS   pregabalin   150 mg Oral TID   senna-docusate  1 tablet Oral BID   Continuous Infusions:  sodium chloride  100 mL/hr at 05/28/24 0110   PRN Meds:.diphenhydrAMINE , naloxone  **AND** sodium chloride  flush, ondansetron  (ZOFRAN ) IV, oxyCODONE , polyethylene glycol  Consultants: None   Procedures: Blood transfusion  Antibiotics: None  Assessment/Plan: Principal Problem:   Sickle cell anemia with crisis (HCC) Active Problems:   Chronic pain syndrome   GAD (generalized anxiety disorder)   Mild intermittent asthma without complication   Anemia of chronic disease   Dysuria   Hb Sickle Cell Disease with Pain crisis: Continue IVF 0.45% Saline @KVO , continue weight based Dilaudid  PCA, IV Toradol  15 mg Q 6 H for a total of 5 days, continue oral home pain medications as ordered. Monitor vitals very closely, Re-evaluate pain scale regularly, 2 L of Oxygen by [place]. Patient encouraged to ambulate on the hallway today.    Anemia of Chronic Disease: Hemoglobin below patient's baseline at 6.2 g/dL.  Will transfuse 1 unit packed red blood cells.  Follow-up a.m. labs. Chronic pain Syndrome: Continue oral home pain medication as prescribed. Generalized anxiety  disorder: Clinically stable.  Patient denies any suicidal ideations or thoughts.  Will continue with her home medication.  Code Status: Full Code Family Communication: N/A Disposition Plan: Not yet ready for discharge  Bailey Reese Cover NP  If 7PM-7AM, please contact night-coverage.  05/28/2024, 10:30 AM  LOS: 1 day

## 2024-05-28 NOTE — TOC Initial Note (Signed)
 Transition of Care [facility]) - Initial/Assessment Note    Patient Details  Name: Bailey Reese MRN: [id number] Date of Birth: 02-09-2006  Transition of Care [facility]) CM/SW Contact:    Toy LITTIE Agar, RN Phone Number:[phone]  05/28/2024, 4:27 PM  Clinical Narrative:                 Inpatient care manager following patient with high risk for readmission. Patient independent from home . Patient receives care at [facility]. DME needs Home O2 and walker previously set up. CM will follow for any disposition needs.   Expected Discharge Plan: Home/Self Care Barriers to Discharge: Continued Medical Work up   Patient Goals and CMS Choice Patient states their goals for this hospitalization and ongoing recovery are:: pain relief   Choice offered to / list presented to : NA      Expected Discharge Plan and Services In-house Referral: NA Discharge Planning Services: CM Consult Post Acute Care Choice: NA Living arrangements for the past 2 months: Single Family Home                 DME Arranged: N/A DME Agency: NA       HH Arranged: NA HH Agency: NA        Prior Living Arrangements/Services Living arrangements for the past 2 months: Single Family Home Lives with:: Self Patient language and need for interpreter reviewed:: Yes Do you feel safe going back to the place where you live?: Yes      Need for Family Participation in Patient Care: No (Comment) Care giver support system in place?: Yes (comment) Current home services: DME (Home O2 & walker) Criminal Activity/Legal Involvement Pertinent to Current Situation/Hospitalization: No - Comment as needed  Activities of Daily Living   ADL Screening (condition at time of admission) Independently performs ADLs?: Yes (appropriate for developmental age) Is the patient deaf or have difficulty hearing?: No Does the patient have difficulty seeing, even when wearing glasses/contacts?: No Does the patient have difficulty  concentrating, remembering, or making decisions?: No  Permission Sought/Granted Permission sought to share information with : Family Supports Permission granted to share information with : Yes, Verbal Permission Granted  Share Information with NAME: Darletta Noblett  Permission granted to share info w AGENCY: [phone]  Permission granted to share info w Relationship: mother     Emotional Assessment Appearance:: Appears stated age Attitude/Demeanor/Rapport: Gracious Affect (typically observed): Calm Orientation: : Oriented to Self, Oriented to Place, Oriented to  Time, Oriented to Situation Alcohol  / Substance Use: Not Applicable Psych Involvement: No (comment)  Admission diagnosis:  Sickle cell anemia (HCC) [D57.1] Sickle cell anemia with crisis (HCC) [D57.00] Patient Active Problem List   Diagnosis Date Noted   Dysuria 05/28/2024   Influenza A 04/16/2024   Acute hypoxic respiratory failure (HCC) 04/16/2024   UTI symptoms 02/20/2024   Seizure-like activity (HCC) 01/01/2024   Anemia of chronic disease 11/21/2023   Sickle cell anemia with pain (HCC) 08/21/2023   Avascular necrosis of hip, right (HCC) 07/23/2023   GI bleed 07/03/2023   Vasoocclusive sickle cell crisis (HCC) 07/02/2023   Sickle cell anemia with crisis (HCC) 05/28/2023   Chronic pain syndrome 05/28/2023   Leukocytosis 05/28/2023   Thrombocytosis 05/28/2023   Hyperbilirubinemia 05/08/2023   GAD (generalized anxiety disorder) 03/29/2023   Asthma, chronic 12/23/2022   Red blood cell antibody positive 09/24/2022   Obstructive sleep apnea syndrome 02/04/2022   Mild intermittent asthma without complication 02/04/2022   Environmental allergies 02/04/2022  Allergic rhinitis 08/12/2020   Vitamin D  insufficiency 06/20/2018   Family dysfunction    Sickle cell crisis (HCC) 08/23/2017   Sickle cell pain crisis (HCC) 08/23/2017   PCP:  Oley Bascom RAMAN, NP Pharmacy:   [facility] [address] ([address]), [place] -  [address] [place] ([place]) [place] [address] Phone: [phone] Fax: [phone]  DARRYLE LONG - [facility] [address] [place] [address] Phone: [phone] Fax: [phone]     Social Drivers of Health (SDOH) Social History: SDOH Screenings   Food Insecurity: Food Insecurity Present (05/27/2024)  Housing: High Risk (05/27/2024)  Transportation Needs: Unmet Transportation Needs (05/27/2024)  Utilities: Not At Risk (05/27/2024)  Depression (PHQ2-9): High Risk (02/09/2024)  Financial Resource Strain: Low Risk (07/25/2023)   [facility] from [facility]  Physical Activity: Insufficiently Active (12/10/2021)   Received from [facility]  Social Connections: Socially Isolated (01/15/2024)  Stress: Stress Concern Present (12/10/2021)   Received from [facility]  Tobacco Use: Low Risk (05/27/2024)   SDOH Interventions:     Readmission Risk Interventions    05/28/2024    4:23 PM 01/16/2024    8:38 AM 01/01/2024    9:35 PM  Readmission Risk Prevention Plan  Transportation Screening Complete Complete Complete  Medication Review (RN Care Manager) Complete Complete Complete  PCP or Specialist appointment within 3-5 days of discharge Complete Complete Complete  HRI or Home Care Consult Complete Complete Complete  SW Recovery Care/Counseling Consult Complete Complete Complete  Palliative Care Screening Not Applicable Not Applicable Not Applicable  Skilled Nursing Facility Not Applicable Not Applicable Not Applicable

## 2024-05-28 NOTE — Plan of Care (Signed)
" °  Problem: Activity: Goal: Risk for activity intolerance will decrease Outcome: Progressing   Problem: Nutrition: Goal: Adequate nutrition will be maintained Outcome: Progressing   Problem: Safety: Goal: Ability to remain free from injury will improve Outcome: Progressing   Problem: Skin Integrity: Goal: Risk for impaired skin integrity will decrease Outcome: Progressing   Problem: Sensory: Goal: Pain level will decrease with appropriate interventions Outcome: Progressing   "

## 2024-05-29 ENCOUNTER — Ambulatory Visit: Payer: Self-pay | Admitting: Nurse Practitioner

## 2024-05-29 ENCOUNTER — Inpatient Hospital Stay (HOSPITAL_COMMUNITY)

## 2024-05-29 LAB — TYPE AND SCREEN
ABO/RH(D): B POS
Antibody Screen: NEGATIVE
Unit division: 0

## 2024-05-29 LAB — CBC
HCT: 21.2 % — ABNORMAL LOW (ref 36.0–46.0)
Hemoglobin: 7.4 g/dL — ABNORMAL LOW (ref 12.0–15.0)
MCH: 30.2 pg (ref 26.0–34.0)
MCHC: 34.9 g/dL (ref 30.0–36.0)
MCV: 86.5 fL (ref 80.0–100.0)
Platelets: 446 K/uL — ABNORMAL HIGH (ref 150–400)
RBC: 2.45 MIL/uL — ABNORMAL LOW (ref 3.87–5.11)
RDW: 21.2 % — ABNORMAL HIGH (ref 11.5–15.5)
WBC: 10.6 K/uL — ABNORMAL HIGH (ref 4.0–10.5)
nRBC: 1 % — ABNORMAL HIGH (ref 0.0–0.2)

## 2024-05-29 LAB — BPAM RBC
Blood Product Expiration Date: 202602022359
ISSUE DATE / TIME: 202601131521
Unit Type and Rh: 7300

## 2024-05-29 MED ORDER — SODIUM CHLORIDE 0.9% FLUSH: 10.0000 mL | INTRAVENOUS | Status: DC | PRN

## 2024-05-29 MED ORDER — SODIUM CHLORIDE 0.9% FLUSH
10.0000 mL | Freq: Two times a day (BID) | INTRAVENOUS | Status: DC
  Administered 2024-05-29 – 2024-06-02 (×6): 10 mL

## 2024-05-29 NOTE — Plan of Care (Signed)

## 2024-05-29 NOTE — Progress Notes (Signed)
 Patient ID: Bailey Reese, female   DOB: 01/11/2006, 19 y.o.   MRN: [id number] Subjective: Bailey Reese  is a 19 y.o. female with medical history that is significant for sickle cell disease, PTSD, major depression and generalized anxiety disorder, chronic pain syndrome, and anemia of chronic disease who usually gets her medical care at [facility] but in the process of transferring her care to [facility], she was seen at [facility] emergency room for major complaints of generalized bodyaches that is typical of her sickle cell pain crisis.   Patient is endorsing pain of 8-7/10. She has no new concerns.   She denies subjective fever, headache, shortness of breath, nausea, vomiting, diarrhea.   Objective:  Vital signs in last 24 hours:  Vitals:   05/29/24 0207 05/29/24 0408 05/29/24 0648 05/29/24 0958  BP: (!) 108/56  104/68   Pulse: 100  91   Resp: 18 17 18 19   Temp: 98.6 F (37 C)  98.2 F (36.8 C)   TempSrc: Oral  Oral   SpO2: 93% 95% 94% 96%  Weight:      Height:        Intake/Output from previous day:   Intake/Output Summary (Last 24 hours) at 05/29/2024 1154 Last data filed at 05/28/2024 1815 Gross per 24 hour  Intake 355 ml  Output 800 ml  Net -445 ml    Physical Exam: General: Alert, awake, oriented x3, in no acute distress.  HEENT: [place]/AT PEERL, EOMI Neck: Trachea midline,  no masses, no thyromegal,y no JVD, no carotid bruit OROPHARYNX:  Moist, No exudate/ erythema/lesions.  Heart: Regular rate and rhythm, without murmurs, rubs, gallops, PMI non-displaced, no heaves or thrills on palpation.  Lungs: Clear to auscultation, no wheezing or rhonchi noted. No increased vocal fremitus resonant to percussion  Abdomen: Soft, nontender, nondistended, positive bowel sounds, no masses no hepatosplenomegaly noted..  Neuro: No focal neurological deficits noted cranial nerves II through XII grossly intact. DTRs 2+ bilaterally upper and lower extremities. Strength 5 out of 5 in  bilateral upper and lower extremities. Musculoskeletal: Generalized body tenderness. Psychiatric: Patient alert and oriented x3, good insight and cognition, good recent to remote recall. Lymph node survey: No cervical axillary or inguinal lymphadenopathy noted.  Lab Results:  Basic Metabolic Panel:    Component Value Date/Time   NA 137 05/27/2024 0308   NA 139 02/09/2024 1433   NA 135 05/01/2013 1440   K 4.0 05/27/2024 0308   K 4.3 05/01/2013 1440   CL 103 05/27/2024 0308   CL 102 05/01/2013 1440   CO2 19 (L) 05/27/2024 0308   CO2 27 (H) 05/01/2013 1440   BUN 7 05/27/2024 0308   BUN 7 02/09/2024 1433   BUN 13 05/01/2013 1440   CREATININE 0.52 05/27/2024 0308   CREATININE 0.40 (L) 05/01/2013 1440   GLUCOSE 82 05/27/2024 0308   GLUCOSE 108 (H) 05/01/2013 1440   CALCIUM 9.7 05/27/2024 0308   CALCIUM 9.8 05/01/2013 1440   CBC:    Component Value Date/Time   WBC 10.6 (H) 05/29/2024 0742   HGB 7.4 (L) 05/29/2024 0742   HGB 8.3 (L) 02/09/2024 1433   HCT 21.2 (L) 05/29/2024 0742   HCT 25.2 (L) 02/09/2024 1433   PLT 446 (H) 05/29/2024 0742   PLT 359 02/09/2024 1433   MCV 86.5 05/29/2024 0742   MCV 98 (H) 02/09/2024 1433   MCV 89 05/01/2013 1440   NEUTROABS 4.9 05/28/2024 0727   NEUTROABS 7.3 (H) 02/09/2024 1433   NEUTROABS  7.9 05/30/2012 1958   LYMPHSABS 2.8 05/28/2024 0727   LYMPHSABS 2.8 02/09/2024 1433   LYMPHSABS 5.0 05/30/2012 1958   MONOABS 1.7 (H) 05/28/2024 0727   MONOABS 1.6 (H) 05/30/2012 1958   EOSABS 0.5 05/28/2024 0727   EOSABS 0.3 02/09/2024 1433   EOSABS 0.6 05/30/2012 1958   BASOSABS 0.1 05/28/2024 0727   BASOSABS 0.1 02/09/2024 1433   BASOSABS 3 04/28/2013 1736   BASOSABS 0.2 (H) 05/30/2012 1958    Recent Results (from the past 240 hours)  Respiratory (~20 pathogens) panel by PCR     Status: None   Collection Time: 05/28/24  6:43 PM   Specimen: Nasopharyngeal Swab; Respiratory  Result Value Ref Range Status   Adenovirus NOT DETECTED NOT DETECTED  Final   Coronavirus 229E NOT DETECTED NOT DETECTED Final    Comment: (NOTE) The Coronavirus on the Respiratory Panel, DOES NOT test for the novel  Coronavirus (2019 nCoV)    Coronavirus HKU1 NOT DETECTED NOT DETECTED Final   Coronavirus NL63 NOT DETECTED NOT DETECTED Final   Coronavirus OC43 NOT DETECTED NOT DETECTED Final   Metapneumovirus NOT DETECTED NOT DETECTED Final   Rhinovirus / Enterovirus NOT DETECTED NOT DETECTED Final   Influenza A NOT DETECTED NOT DETECTED Final   Influenza B NOT DETECTED NOT DETECTED Final   Parainfluenza Virus 1 NOT DETECTED NOT DETECTED Final   Parainfluenza Virus 2 NOT DETECTED NOT DETECTED Final   Parainfluenza Virus 3 NOT DETECTED NOT DETECTED Final   Parainfluenza Virus 4 NOT DETECTED NOT DETECTED Final   Respiratory Syncytial Virus NOT DETECTED NOT DETECTED Final   Bordetella pertussis NOT DETECTED NOT DETECTED Final   Bordetella Parapertussis NOT DETECTED NOT DETECTED Final   Chlamydophila pneumoniae NOT DETECTED NOT DETECTED Final   Mycoplasma pneumoniae NOT DETECTED NOT DETECTED Final    Comment: Performed at [facility], [address], [place], [place] [address]    Studies/Results: No results found.   Medications: Scheduled Meds:  diphenhydrAMINE   25 mg Intravenous Once   HYDROmorphone    Intravenous Q4H   hydroxyurea   1,500 mg Oral Daily   ketorolac   15 mg Intravenous Q6H   pantoprazole   40 mg Oral BID   prazosin   1 mg Oral QHS   pregabalin   150 mg Oral TID   senna-docusate  1 tablet Oral BID   Continuous Infusions:   PRN Meds:.diphenhydrAMINE , guaiFENesin -dextromethorphan , menthol , naloxone  **AND** sodium chloride  flush, ondansetron  (ZOFRAN ) IV, oxyCODONE , phenol, polyethylene glycol  Consultants: None   Procedures: Blood transfusion  Antibiotics: None  Assessment/Plan: Principal Problem:   Sickle cell anemia with crisis (HCC) Active Problems:   Chronic pain syndrome   GAD (generalized anxiety disorder)    Mild intermittent asthma without complication   Anemia of chronic disease   Dysuria   Hb Sickle Cell Disease with Pain crisis: Continue IVF 0.45% Saline @KVO , continue weight based Dilaudid  PCA, IV Toradol  15 mg Q 6 H for a total of 5 days, continue oral home pain medications as ordered. Monitor vitals very closely, Re-evaluate pain scale regularly, 2 L of Oxygen by [place]. Patient encouraged to ambulate on the hallway today.   Dysuria: Urinalysis negative for UTI. Push fluids.    Anemia of Chronic Disease: Hemoglobin back to baseline at 7.4 g/dl after transfusing 2 units PRBCs  Follow-up a.m. labs. Chronic pain Syndrome: Continue oral home pain medication as prescribed. Generalized anxiety disorder: Clinically stable.  Patient denies any suicidal ideations or thoughts.  Will continue with her home medication.  Code Status: Full Code Family Communication: N/A Disposition Plan: Not yet ready for discharge  [name] Cover NP  If 7PM-7AM, please contact night-coverage.  05/29/2024, 11:54 AM  LOS: 2 days

## 2024-05-29 NOTE — Plan of Care (Signed)
  Problem: Activity: Goal: Risk for activity intolerance will decrease Outcome: Progressing   Problem: Nutrition: Goal: Adequate nutrition will be maintained Outcome: Progressing   Problem: Coping: Goal: Level of anxiety will decrease Outcome: Progressing   Problem: Pain Managment: Goal: General experience of comfort will improve and/or be controlled Outcome: Progressing   Problem: Safety: Goal: Ability to remain free from injury will improve Outcome: Progressing

## 2024-05-30 ENCOUNTER — Inpatient Hospital Stay (HOSPITAL_COMMUNITY)

## 2024-05-30 DIAGNOSIS — R042 Hemoptysis: Secondary | ICD-10-CM | POA: Insufficient documentation

## 2024-05-30 LAB — CBC
HCT: 21.4 % — ABNORMAL LOW (ref 36.0–46.0)
Hemoglobin: 7.4 g/dL — ABNORMAL LOW (ref 12.0–15.0)
MCH: 30.1 pg (ref 26.0–34.0)
MCHC: 34.6 g/dL (ref 30.0–36.0)
MCV: 87 fL (ref 80.0–100.0)
Platelets: 467 K/uL — ABNORMAL HIGH (ref 150–400)
RBC: 2.46 MIL/uL — ABNORMAL LOW (ref 3.87–5.11)
RDW: 21.2 % — ABNORMAL HIGH (ref 11.5–15.5)
WBC: 12.4 K/uL — ABNORMAL HIGH (ref 4.0–10.5)
nRBC: 1.7 % — ABNORMAL HIGH (ref 0.0–0.2)

## 2024-05-30 LAB — D-DIMER, QUANTITATIVE: D-Dimer, Quant: 0.64 ug{FEU}/mL — ABNORMAL HIGH (ref 0.00–0.50)

## 2024-05-30 MED ORDER — IOHEXOL 350 MG/ML SOLN
75.0000 mL | Freq: Once | INTRAVENOUS | Status: AC | PRN
  Administered 2024-05-30: 75 mL via INTRAVENOUS

## 2024-05-30 NOTE — Progress Notes (Signed)
 Patient ID: BATHSHEBA DURRETT, female   DOB: [birth year], 21 y.o.   MRN: [id number] Subjective: Maddalynn Barnard  is a 19 y.o. female with medical history that is significant for sickle cell disease, PTSD, major depression and generalized anxiety disorder, chronic pain syndrome, and anemia of chronic disease who usually gets her medical care at [facility] but in the process of transferring her care to [facility] health, she was seen at [facility] emergency room for major complaints of generalized bodyaches that is typical of her sickle cell pain crisis.   Patient is endorsing pain of 7/10 today. She continues to report hemoptysis and cough.  Chest x-ray shows no active disease.  Will continue to monitor.  She denies subjective fever, headache, shortness of breath, nausea, vomiting, diarrhea.   Objective:  Vital signs in last 24 hours:  Vitals:   05/29/24 2138 05/29/24 2303 05/30/24 0228 05/30/24 0506  BP: 109/66  114/63   Pulse: (!) 104  91   Resp: 18 15 18 15   Temp: 98.7 F (37.1 C)  97.7 F (36.5 C)   TempSrc: Oral  Oral   SpO2: 99% 97% 100% 95%  Weight:      Height:        Intake/Output from previous day:   Intake/Output Summary (Last 24 hours) at 05/30/2024 0945 Last data filed at 05/30/2024 0500 Gross per 24 hour  Intake 717 ml  Output 1000 ml  Net -283 ml    Physical Exam: General: Alert, awake, oriented x3, in no acute distress.  HEENT: [place]/AT PEERL, EOMI Neck: Trachea midline,  no masses, no thyromegal,y no JVD, no carotid bruit OROPHARYNX:  Moist, No exudate/ erythema/lesions.  Heart: Regular rate and rhythm, without murmurs, rubs, gallops, PMI non-displaced, no heaves or thrills on palpation.  Lungs: Clear to auscultation, no wheezing or rhonchi noted. No increased vocal fremitus resonant to percussion  Abdomen: Soft, nontender, nondistended, positive bowel sounds, no masses no hepatosplenomegaly noted..  Neuro: No focal neurological deficits noted cranial nerves II through  XII grossly intact. DTRs 2+ bilaterally upper and lower extremities. Strength 5 out of 5 in bilateral upper and lower extremities. Musculoskeletal: Generalized body tenderness. Psychiatric: Patient alert and oriented x3, good insight and cognition, good recent to remote recall. Lymph node survey: No cervical axillary or inguinal lymphadenopathy noted.  Lab Results:  Basic Metabolic Panel:    Component Value Date/Time   NA 137 05/27/2024 0308   NA 139 02/09/2024 1433   NA 135 05/01/2013 1440   K 4.0 05/27/2024 0308   K 4.3 05/01/2013 1440   CL 103 05/27/2024 0308   CL 102 05/01/2013 1440   CO2 19 (L) 05/27/2024 0308   CO2 27 (H) 05/01/2013 1440   BUN 7 05/27/2024 0308   BUN 7 02/09/2024 1433   BUN 13 05/01/2013 1440   CREATININE 0.52 05/27/2024 0308   CREATININE 0.40 (L) 05/01/2013 1440   GLUCOSE 82 05/27/2024 0308   GLUCOSE 108 (H) 05/01/2013 1440   CALCIUM 9.7 05/27/2024 0308   CALCIUM 9.8 05/01/2013 1440   CBC:    Component Value Date/Time   WBC 12.4 (H) 05/30/2024 0722   HGB 7.4 (L) 05/30/2024 0722   HGB 8.3 (L) 02/09/2024 1433   HCT 21.4 (L) 05/30/2024 0722   HCT 25.2 (L) 02/09/2024 1433   PLT 467 (H) 05/30/2024 0722   PLT 359 02/09/2024 1433   MCV 87.0 05/30/2024 0722   MCV 98 (H) 02/09/2024 1433   MCV 89 05/01/2013 1440  NEUTROABS 4.9 05/28/2024 0727   NEUTROABS 7.3 (H) 02/09/2024 1433   NEUTROABS 7.9 05/30/2012 1958   LYMPHSABS 2.8 05/28/2024 0727   LYMPHSABS 2.8 02/09/2024 1433   LYMPHSABS 5.0 05/30/2012 1958   MONOABS 1.7 (H) 05/28/2024 0727   MONOABS 1.6 (H) 05/30/2012 1958   EOSABS 0.5 05/28/2024 0727   EOSABS 0.3 02/09/2024 1433   EOSABS 0.6 05/30/2012 1958   BASOSABS 0.1 05/28/2024 0727   BASOSABS 0.1 02/09/2024 1433   BASOSABS 3 04/28/2013 1736   BASOSABS 0.2 (H) 05/30/2012 1958    Recent Results (from the past 240 hours)  Respiratory (~20 pathogens) panel by PCR     Status: None   Collection Time: 05/28/24  6:43 PM   Specimen:  Nasopharyngeal Swab; Respiratory  Result Value Ref Range Status   Adenovirus NOT DETECTED NOT DETECTED Final   Coronavirus 229E NOT DETECTED NOT DETECTED Final    Comment: (NOTE) The Coronavirus on the Respiratory Panel, DOES NOT test for the novel  Coronavirus (2019 nCoV)    Coronavirus HKU1 NOT DETECTED NOT DETECTED Final   Coronavirus NL63 NOT DETECTED NOT DETECTED Final   Coronavirus OC43 NOT DETECTED NOT DETECTED Final   Metapneumovirus NOT DETECTED NOT DETECTED Final   Rhinovirus / Enterovirus NOT DETECTED NOT DETECTED Final   Influenza A NOT DETECTED NOT DETECTED Final   Influenza B NOT DETECTED NOT DETECTED Final   Parainfluenza Virus 1 NOT DETECTED NOT DETECTED Final   Parainfluenza Virus 2 NOT DETECTED NOT DETECTED Final   Parainfluenza Virus 3 NOT DETECTED NOT DETECTED Final   Parainfluenza Virus 4 NOT DETECTED NOT DETECTED Final   Respiratory Syncytial Virus NOT DETECTED NOT DETECTED Final   Bordetella pertussis NOT DETECTED NOT DETECTED Final   Bordetella Parapertussis NOT DETECTED NOT DETECTED Final   Chlamydophila pneumoniae NOT DETECTED NOT DETECTED Final   Mycoplasma pneumoniae NOT DETECTED NOT DETECTED Final    Comment: Performed at [facility], [address] [place], [place] [address]    Studies/Results: [place] CHEST PORT 1 VIEW Result Date: 05/29/2024 CLINICAL DATA:  Hemoptysis. EXAM: PORTABLE CHEST 1 VIEW COMPARISON:  Chest radiograph dated 05/27/2024. FINDINGS: The heart size and mediastinal contours are within normal limits. Both lungs are clear. The visualized skeletal structures are unremarkable. IMPRESSION: No active disease. Electronically Signed   By: Vanetta Chou M.D.   On: 05/29/2024 13:27     Medications: Scheduled Meds:  diphenhydrAMINE   25 mg Intravenous Once   HYDROmorphone    Intravenous Q4H   hydroxyurea   1,500 mg Oral Daily   ketorolac   15 mg Intravenous Q6H   pantoprazole   40 mg Oral BID   prazosin   1 mg Oral QHS   pregabalin   150  mg Oral TID   senna-docusate  1 tablet Oral BID   sodium chloride  flush  10-40 mL Intracatheter Q12H   Continuous Infusions:   PRN Meds:.diphenhydrAMINE , guaiFENesin -dextromethorphan , menthol , naloxone  **AND** sodium chloride  flush, ondansetron  (ZOFRAN ) IV, oxyCODONE , phenol, polyethylene glycol, sodium chloride  flush  Consultants: None   Procedures: Blood transfusion  Antibiotics: None  Assessment/Plan: Principal Problem:   Sickle cell anemia with crisis (HCC) Active Problems:   Chronic pain syndrome   GAD (generalized anxiety disorder)   Mild intermittent asthma without complication   Anemia of chronic disease   Dysuria   Hemoptysis   Hb Sickle Cell Disease with Pain crisis: Pain is gradually improving, continue IVF 0.45% Saline @KVO , continue weight based Dilaudid  PCA, IV Toradol  15 mg Q 6 H for a total  of 5 days, continue oral home pain medications as ordered. Monitor vitals very closely, Re-evaluate pain scale regularly, 2 L of Oxygen by [place]. Patient encouraged to ambulate on the hallway today.   Hemoptysis: Chest x-ray negative for acute disease, TB QuantiFERON, CT angio chest pulmonary embolism and D-dimer test ordered results pending.  Dysuria: Urinalysis negative for UTI. Push fluids.    Anemia of Chronic Disease: Hemoglobin back to baseline at 7.4 g/dl after transfusing 2 units PRBCs .  Will continue to monitor. Chronic pain Syndrome: Continue oral home pain medication as prescribed. Generalized anxiety disorder: Clinically stable.  Patient denies any suicidal ideations or thoughts.  Will continue with her home medication.  Code Status: Full Code Family Communication: N/A Disposition Plan: Not yet ready for discharge  [name] Cover NP  If 7PM-7AM, please contact night-coverage.  05/30/2024, 9:45 AM  LOS: 3 days

## 2024-05-30 NOTE — Plan of Care (Signed)
   Problem: Activity: Goal: Risk for activity intolerance will decrease Outcome: Progressing   Problem: Nutrition: Goal: Adequate nutrition will be maintained Outcome: Progressing   Problem: Pain Managment: Goal: General experience of comfort will improve and/or be controlled Outcome: Progressing   Problem: Safety: Goal: Ability to remain free from injury will improve Outcome: Progressing

## 2024-05-30 NOTE — Progress Notes (Signed)
" °  [facility] - Unable To Enroll Patient  A [facility] has been unable to reach patient after multiple attempts.   Bailey Reese, was identified by admission/discharge/transfer alert as a candidate for [facility] care management services.   At this time, [name] is not yet enrolled in [facility] care management services.  Please consider discussing the benefits of [facility] with Bailey Reese and referring again at a later date.  [facility]  [facility]    [address], [address]; [place], [place] [address] l  [email or web address] l [phone] ([phone]   For more information on [facility] services, click here.   "

## 2024-05-31 LAB — HEPATIC FUNCTION PANEL
ALT: 23 U/L (ref 0–44)
AST: 36 U/L (ref 15–41)
Albumin: 3.6 g/dL (ref 3.5–5.0)
Alkaline Phosphatase: 126 U/L (ref 38–126)
Bilirubin, Direct: 0.6 mg/dL — ABNORMAL HIGH (ref 0.0–0.2)
Indirect Bilirubin: 2.4 mg/dL — ABNORMAL HIGH (ref 0.3–0.9)
Total Bilirubin: 3 mg/dL — ABNORMAL HIGH (ref 0.0–1.2)
Total Protein: 6.9 g/dL (ref 6.5–8.1)

## 2024-05-31 LAB — PROTIME-INR
INR: 1.3 — ABNORMAL HIGH (ref 0.8–1.2)
Prothrombin Time: 16.4 s — ABNORMAL HIGH (ref 11.4–15.2)

## 2024-05-31 LAB — CBC
HCT: 21 % — ABNORMAL LOW (ref 36.0–46.0)
Hemoglobin: 7.3 g/dL — ABNORMAL LOW (ref 12.0–15.0)
MCH: 30.8 pg (ref 26.0–34.0)
MCHC: 34.8 g/dL (ref 30.0–36.0)
MCV: 88.6 fL (ref 80.0–100.0)
Platelets: 481 K/uL — ABNORMAL HIGH (ref 150–400)
RBC: 2.37 MIL/uL — ABNORMAL LOW (ref 3.87–5.11)
RDW: 21.7 % — ABNORMAL HIGH (ref 11.5–15.5)
WBC: 13.7 K/uL — ABNORMAL HIGH (ref 4.0–10.5)
nRBC: 2.3 % — ABNORMAL HIGH (ref 0.0–0.2)

## 2024-05-31 MED ORDER — OXYMETAZOLINE HCL 0.05 % NA SOLN
1.0000 | Freq: Two times a day (BID) | NASAL | Status: DC
  Administered 2024-05-31 – 2024-06-02 (×5): 1 via NASAL
  Filled 2024-05-31: qty 15

## 2024-05-31 NOTE — Progress Notes (Signed)
"   9247 MD Gherghe notified of pt coughing up blood and having a heavy nosebleed, SICC team not in yet.   MD Ghereghe: see new orders (Stat CBC, Afrin nasal spray)  0735 MD Onyeje notified of pts condition, 10/10 pain coughing up small bright red blood, heavy nose bleed.   0915: MD Onyeje : see new orders, meds discontinued to risk further bleeding.   "

## 2024-05-31 NOTE — Plan of Care (Signed)

## 2024-05-31 NOTE — Progress Notes (Signed)
 Patient ID: SHAWNE BULOW, female   DOB: 2006-04-02, 19 y.o.   MRN: [id number] Subjective: Elizabethanne Lusher  is a 19 y.o. female with medical history that is significant for sickle cell disease, PTSD, major depression and generalized anxiety disorder, chronic pain syndrome, and anemia of chronic disease who usually gets her medical care at [facility] but in the process of transferring her care to [facility] health, she was seen at [facility] emergency room for major complaints of generalized bodyaches that is typical of her sickle cell pain crisis.   Patient is endorsing pain of 7/10 today. She continues to report hemoptysis and cough. Chest x-ray shows no active disease. CT Angio Chest Pulmonary: No definite evidence of pulmonary embolus. No definite abnormality seen in the chest.  Will continue to monitor.  She denies subjective fever, headache, shortness of breath, nausea, vomiting, diarrhea.   Objective:  Vital signs in last 24 hours:  Vitals:   05/31/24 0743 05/31/24 0749 05/31/24 1122 05/31/24 1417  BP:  102/60  123/77  Pulse: 90 89  73  Resp: 19 16 (!) 8 16  Temp:  97.7 F (36.5 C)  98.4 F (36.9 C)  TempSrc:  Oral  Oral  SpO2: 98% 98% 97% 100%  Weight:      Height:        Intake/Output from previous day:   Intake/Output Summary (Last 24 hours) at 05/31/2024 1504 Last data filed at 05/31/2024 0400 Gross per 24 hour  Intake 739 ml  Output --  Net 739 ml    Physical Exam: General: Alert, awake, oriented x3, in no acute distress.  HEENT: [place]/AT [place], EOMI Neck: Trachea midline,  no masses, no thyromegal,y no JVD, no carotid bruit OROPHARYNX:  Moist, No exudate/ erythema/lesions.  Heart: Regular rate and rhythm, without murmurs, rubs, gallops, PMI non-displaced, no heaves or thrills on palpation.  Lungs: Clear to auscultation, no wheezing or rhonchi noted. No increased vocal fremitus resonant to percussion  Abdomen: Soft, nontender, nondistended, positive bowel sounds, no  masses no hepatosplenomegaly noted..  Neuro: No focal neurological deficits noted cranial nerves II through XII grossly intact. DTRs 2+ bilaterally upper and lower extremities. Strength 5 out of 5 in bilateral upper and lower extremities. Musculoskeletal: Generalized body tenderness. Psychiatric: Patient alert and oriented x3, good insight and cognition, good recent to remote recall. Lymph node survey: No cervical axillary or inguinal lymphadenopathy noted.  Lab Results:  Basic Metabolic Panel:    Component Value Date/Time   NA 137 05/27/2024 0308   NA 139 02/09/2024 1433   NA 135 05/01/2013 1440   K 4.0 05/27/2024 0308   K 4.3 05/01/2013 1440   CL 103 05/27/2024 0308   CL 102 05/01/2013 1440   CO2 19 (L) 05/27/2024 0308   CO2 27 (H) 05/01/2013 1440   BUN 7 05/27/2024 0308   BUN 7 02/09/2024 1433   BUN 13 05/01/2013 1440   CREATININE 0.52 05/27/2024 0308   CREATININE 0.40 (L) 05/01/2013 1440   GLUCOSE 82 05/27/2024 0308   GLUCOSE 108 (H) 05/01/2013 1440   CALCIUM 9.7 05/27/2024 0308   CALCIUM 9.8 05/01/2013 1440   CBC:    Component Value Date/Time   WBC 13.7 (H) 05/31/2024 0807   HGB 7.3 (L) 05/31/2024 0807   HGB 8.3 (L) 02/09/2024 1433   HCT 21.0 (L) 05/31/2024 0807   HCT 25.2 (L) 02/09/2024 1433   PLT 481 (H) 05/31/2024 0807   PLT 359 02/09/2024 1433   MCV 88.6 05/31/2024 0807  MCV 98 (H) 02/09/2024 1433   MCV 89 05/01/2013 1440   NEUTROABS 4.9 05/28/2024 0727   NEUTROABS 7.3 (H) 02/09/2024 1433   NEUTROABS 7.9 05/30/2012 1958   LYMPHSABS 2.8 05/28/2024 0727   LYMPHSABS 2.8 02/09/2024 1433   LYMPHSABS 5.0 05/30/2012 1958   MONOABS 1.7 (H) 05/28/2024 0727   MONOABS 1.6 (H) 05/30/2012 1958   EOSABS 0.5 05/28/2024 0727   EOSABS 0.3 02/09/2024 1433   EOSABS 0.6 05/30/2012 1958   BASOSABS 0.1 05/28/2024 0727   BASOSABS 0.1 02/09/2024 1433   BASOSABS 3 04/28/2013 1736   BASOSABS 0.2 (H) 05/30/2012 1958    Recent Results (from the past 240 hours)  Respiratory  (~20 pathogens) panel by PCR     Status: None   Collection Time: 05/28/24  6:43 PM   Specimen: Nasopharyngeal Swab; Respiratory  Result Value Ref Range Status   Adenovirus NOT DETECTED NOT DETECTED Final   Coronavirus 229E NOT DETECTED NOT DETECTED Final    Comment: (NOTE) The Coronavirus on the Respiratory Panel, DOES NOT test for the novel  Coronavirus (2019 nCoV)    Coronavirus HKU1 NOT DETECTED NOT DETECTED Final   Coronavirus NL63 NOT DETECTED NOT DETECTED Final   Coronavirus OC43 NOT DETECTED NOT DETECTED Final   Metapneumovirus NOT DETECTED NOT DETECTED Final   Rhinovirus / Enterovirus NOT DETECTED NOT DETECTED Final   Influenza A NOT DETECTED NOT DETECTED Final   Influenza B NOT DETECTED NOT DETECTED Final   Parainfluenza Virus 1 NOT DETECTED NOT DETECTED Final   Parainfluenza Virus 2 NOT DETECTED NOT DETECTED Final   Parainfluenza Virus 3 NOT DETECTED NOT DETECTED Final   Parainfluenza Virus 4 NOT DETECTED NOT DETECTED Final   Respiratory Syncytial Virus NOT DETECTED NOT DETECTED Final   Bordetella pertussis NOT DETECTED NOT DETECTED Final   Bordetella Parapertussis NOT DETECTED NOT DETECTED Final   Chlamydophila pneumoniae NOT DETECTED NOT DETECTED Final   Mycoplasma pneumoniae NOT DETECTED NOT DETECTED Final    Comment: Performed at [facility], [address], [place], [place] [address]    Studies/Results: CT Angio Chest Pulmonary Embolism (PE) W or WO Contrast Result Date: 05/30/2024 CLINICAL DATA:  Hemoptysis EXAM: CT ANGIOGRAPHY CHEST WITH CONTRAST TECHNIQUE: Multidetector CT imaging of the chest was performed using the standard protocol during bolus administration of intravenous contrast. Multiplanar CT image reconstructions and MIPs were obtained to evaluate the vascular anatomy. RADIATION DOSE REDUCTION: This exam was performed according to the departmental dose-optimization program which includes automated exposure control, adjustment of the mA and/or kV  according to patient size and/or use of iterative reconstruction technique. CONTRAST:  75mL OMNIPAQUE  IOHEXOL  350 MG/ML SOLN COMPARISON:  Yesterday FINDINGS: Cardiovascular: Satisfactory opacification of the pulmonary arteries to the segmental level. No evidence of pulmonary embolism. Normal heart size. No pericardial effusion. Mediastinum/Nodes: No enlarged mediastinal, hilar, or axillary lymph nodes. Thyroid gland, trachea, and esophagus demonstrate no significant findings. Lungs/Pleura: Lungs are clear. No pleural effusion or pneumothorax. Upper Abdomen: No acute abnormality. Musculoskeletal: No chest wall abnormality. No acute or significant osseous findings. Review of the MIP images confirms the above findings. IMPRESSION: No definite evidence of pulmonary embolus. No definite abnormality seen in the chest. Electronically Signed   By: Lynwood Landy Raddle M.D.   On: 05/30/2024 10:58     Medications: Scheduled Meds:  HYDROmorphone    Intravenous Q4H   oxymetazoline   1 spray Each Nare BID   pantoprazole   40 mg Oral BID   prazosin   1 mg Oral  QHS   pregabalin   150 mg Oral TID   senna-docusate  1 tablet Oral BID   sodium chloride  flush  10-40 mL Intracatheter Q12H   Continuous Infusions:   PRN Meds:.diphenhydrAMINE , guaiFENesin -dextromethorphan , menthol , naloxone  **AND** sodium chloride  flush, ondansetron  (ZOFRAN ) IV, oxyCODONE , phenol, polyethylene glycol, sodium chloride  flush  Consultants: None   Procedures: Blood transfusion  Antibiotics: None  Assessment/Plan: Principal Problem:   Sickle cell anemia with crisis (HCC) Active Problems:   Chronic pain syndrome   GAD (generalized anxiety disorder)   Mild intermittent asthma without complication   Anemia of chronic disease   Dysuria   Hemoptysis   Hb Sickle Cell Disease with Pain crisis: Pain is gradually improving, continue IVF 0.45% Saline @KVO , continue weight based Dilaudid  PCA, IV Toradol  15 mg Q 6 H for a total of 5 days,  continue oral home pain medications as ordered. Monitor vitals very closely, Re-evaluate pain scale regularly, 2 L of Oxygen by [place]. Patient encouraged to ambulate on the hallway today.   Hemoptysis: Chest x-ray negative for acute disease, TB QuantiFERON, CT angio chest pulmonary embolism shows No definite evidence of pulmonary embolus. No definite abnormality seen in the chest. D-dimer test: No significant changes.   Dysuria: Urinalysis negative for UTI. Push fluids.    Anemia of Chronic Disease: Hemoglobin back to baseline at 7.3 g/dl after transfusing 2 units PRBCs . Will continue to monitor. Chronic pain Syndrome: Continue oral home pain medication as prescribed. Generalized anxiety disorder: Clinically stable. Patient denies any suicidal ideations or thoughts.  Will continue with her home medication.  Code Status: Full Code Family Communication: N/A Disposition Plan: Not yet ready for discharge  [name] Cover NP  If 7PM-7AM, please contact night-coverage.  05/31/2024, 3:04 PM  LOS: 4 days

## 2024-06-01 DIAGNOSIS — D5700 Hb-SS disease with crisis, unspecified: Secondary | ICD-10-CM | POA: Diagnosis not present

## 2024-06-01 LAB — CBC
HCT: 19.6 % — ABNORMAL LOW (ref 36.0–46.0)
Hemoglobin: 7 g/dL — ABNORMAL LOW (ref 12.0–15.0)
MCH: 31.4 pg (ref 26.0–34.0)
MCHC: 35.7 g/dL (ref 30.0–36.0)
MCV: 87.9 fL (ref 80.0–100.0)
Platelets: 524 K/uL — ABNORMAL HIGH (ref 150–400)
RBC: 2.23 MIL/uL — ABNORMAL LOW (ref 3.87–5.11)
RDW: 21.4 % — ABNORMAL HIGH (ref 11.5–15.5)
WBC: 14.2 K/uL — ABNORMAL HIGH (ref 4.0–10.5)
nRBC: 2.6 % — ABNORMAL HIGH (ref 0.0–0.2)

## 2024-06-01 MED ORDER — AMOXICILLIN-POT CLAVULANATE 875-125 MG PO TABS
1.0000 | ORAL_TABLET | Freq: Two times a day (BID) | ORAL | Status: DC
  Administered 2024-06-01 – 2024-06-02 (×3): 1 via ORAL
  Filled 2024-06-01 (×3): qty 1

## 2024-06-01 MED ORDER — HYDROXYUREA 500 MG PO CAPS
1500.0000 mg | ORAL_CAPSULE | Freq: Every day | ORAL | Status: DC
  Administered 2024-06-01 – 2024-06-02 (×2): 1500 mg via ORAL
  Filled 2024-06-01 (×2): qty 3

## 2024-06-01 MED ORDER — AMOXICILLIN-POT CLAVULANATE 875-125 MG PO TABS: 1.0000 | ORAL_TABLET | Freq: Two times a day (BID) | ORAL | Status: DC

## 2024-06-01 NOTE — Plan of Care (Signed)
" °  Problem: Education: Goal: Knowledge of General Education information will improve Description: Including pain rating scale, medication(s)/side effects and non-pharmacologic comfort measures 06/01/2024 0223 by Epifanio Venetia BIRCH, RN Outcome: Progressing 06/01/2024 0223 by Epifanio Venetia BIRCH, RN Outcome: Progressing   Problem: Health Behavior/Discharge Planning: Goal: Ability to manage health-related needs will improve 06/01/2024 0223 by Epifanio Venetia BIRCH, RN Outcome: Progressing 06/01/2024 0223 by Epifanio Venetia BIRCH, RN Outcome: Progressing   Problem: Clinical Measurements: Goal: Ability to maintain clinical measurements within normal limits will improve 06/01/2024 0223 by Epifanio Venetia BIRCH, RN Outcome: Progressing 06/01/2024 0223 by Epifanio Venetia BIRCH, RN Outcome: Progressing Goal: Will remain free from infection 06/01/2024 0223 by Epifanio Venetia BIRCH, RN Outcome: Progressing 06/01/2024 0223 by Epifanio Venetia BIRCH, RN Outcome: Progressing Goal: Diagnostic test results will improve 06/01/2024 0223 by Epifanio Venetia BIRCH, RN Outcome: Progressing 06/01/2024 0223 by Epifanio Venetia BIRCH, RN Outcome: Progressing Goal: Respiratory complications will improve 06/01/2024 0223 by Epifanio Venetia BIRCH, RN Outcome: Progressing 06/01/2024 0223 by Epifanio Venetia BIRCH, RN Outcome: Progressing Goal: Cardiovascular complication will be avoided 06/01/2024 0223 by Epifanio Venetia BIRCH, RN Outcome: Progressing 06/01/2024 0223 by Epifanio Venetia BIRCH, RN Outcome: Progressing   Problem: Activity: Goal: Risk for activity intolerance will decrease 06/01/2024 0223 by Epifanio Venetia BIRCH, RN Outcome: Progressing 06/01/2024 0223 by Epifanio Venetia BIRCH, RN Outcome: Progressing   Problem: Nutrition: Goal: Adequate nutrition will be maintained 06/01/2024 0223 by Epifanio Venetia BIRCH, RN Outcome: Progressing 06/01/2024 0223 by Epifanio Venetia BIRCH, RN Outcome: Progressing   Problem: Coping: Goal: Level of anxiety will decrease 06/01/2024 0223 by Epifanio Venetia BIRCH, RN Outcome:  Progressing 06/01/2024 0223 by Epifanio Venetia BIRCH, RN Outcome: Progressing   Problem: Elimination: Goal: Will not experience complications related to bowel motility Outcome: Progressing Goal: Will not experience complications related to urinary retention Outcome: Progressing   Problem: Pain Managment: Goal: General experience of comfort will improve and/or be controlled Outcome: Progressing   Problem: Safety: Goal: Ability to remain free from injury will improve Outcome: Progressing   Problem: Skin Integrity: Goal: Risk for impaired skin integrity will decrease Outcome: Progressing   Problem: Education: Goal: Knowledge of vaso-occlusive preventative measures will improve Outcome: Progressing Goal: Awareness of infection prevention will improve Outcome: Progressing Goal: Awareness of signs and symptoms of anemia will improve Outcome: Progressing Goal: Long-term complications will improve Outcome: Progressing   Problem: Self-Care: Goal: Ability to incorporate actions that prevent/reduce pain crisis will improve Outcome: Progressing   Problem: Bowel/Gastric: Goal: Gut motility will be maintained Outcome: Progressing   Problem: Tissue Perfusion: Goal: Complications related to inadequate tissue perfusion will be avoided or minimized Outcome: Progressing   Problem: Respiratory: Goal: Pulmonary complications will be avoided or minimized Outcome: Progressing Goal: Acute Chest Syndrome will be identified early to prevent complications Outcome: Progressing   Problem: Fluid Volume: Goal: Ability to maintain a balanced intake and output will improve Outcome: Progressing   Problem: Sensory: Goal: Pain level will decrease with appropriate interventions Outcome: Progressing   Problem: Health Behavior: Goal: Postive changes in compliance with treatment and prescription regimens will improve Outcome: Progressing   "

## 2024-06-01 NOTE — Progress Notes (Signed)
 Patient ID: Bailey Reese, female   DOB: Nov 12, 2005, 19 y.o.   MRN: [id number] Subjective: Bailey Reese  is a 19 y.o. female with medical history that is significant for sickle cell disease, PTSD, major depression and generalized anxiety disorder, chronic pain syndrome, and anemia of chronic disease who usually gets her medical care at [facility] but in the process of transferring her care to [facility] health, she was seen at [facility] emergency room for major complaints of generalized bodyaches that is typical of her sickle cell pain crisis.   Patient is very tearful this morning, complaining of pain which she is stuck at 7/10 today. She continues to report hemoptysis, she showed me pictures.  Interestingly it is only pill red blood with no evidence of phlegm, most likely from throat or nasal bleed. Chest x-ray shows no active disease. CT Angio Chest Pulmonary: No definite evidence of pulmonary embolus. No definite abnormality seen in the chest. She denies subjective fever, headache, shortness of breath, nausea, vomiting, diarrhea.   Objective:  Vital signs in last 24 hours:  Vitals:   06/01/24 0925 06/01/24 1044 06/01/24 1244 06/01/24 1503  BP:  116/69  (!) 116/52  Pulse:  87  73  Resp: 16 16 18 12   Temp:  98.6 F (37 C)  98.6 F (37 C)  TempSrc:  Oral  Oral  SpO2: 99% 100% 98% 100%  Weight:      Height:        Intake/Output from previous day:   Intake/Output Summary (Last 24 hours) at 06/01/2024 1511 Last data filed at 06/01/2024 0405 Gross per 24 hour  Intake 28.5 ml  Output --  Net 28.5 ml    Physical Exam: General: Alert, awake, oriented x3, in no acute distress.  HEENT: [place]/AT PEERL, EOMI Neck: Trachea midline,  no masses, no thyromegal,y no JVD, no carotid bruit OROPHARYNX:  Moist, No exudate/ erythema/lesions.  Heart: Regular rate and rhythm, without murmurs, rubs, gallops, PMI non-displaced, no heaves or thrills on palpation.  Lungs: Clear to auscultation, no  wheezing or rhonchi noted. No increased vocal fremitus resonant to percussion  Abdomen: Soft, nontender, nondistended, positive bowel sounds, no masses no hepatosplenomegaly noted..  Neuro: No focal neurological deficits noted cranial nerves II through XII grossly intact. DTRs 2+ bilaterally upper and lower extremities. Strength 5 out of 5 in bilateral upper and lower extremities. Musculoskeletal: Generalized body tenderness. Psychiatric: Patient alert and oriented x3, good insight and cognition, good recent to remote recall. Lymph node survey: No cervical axillary or inguinal lymphadenopathy noted.  Lab Results:  Basic Metabolic Panel:    Component Value Date/Time   NA 137 05/27/2024 0308   NA 139 02/09/2024 1433   NA 135 05/01/2013 1440   K 4.0 05/27/2024 0308   K 4.3 05/01/2013 1440   CL 103 05/27/2024 0308   CL 102 05/01/2013 1440   CO2 19 (L) 05/27/2024 0308   CO2 27 (H) 05/01/2013 1440   BUN 7 05/27/2024 0308   BUN 7 02/09/2024 1433   BUN 13 05/01/2013 1440   CREATININE 0.52 05/27/2024 0308   CREATININE 0.40 (L) 05/01/2013 1440   GLUCOSE 82 05/27/2024 0308   GLUCOSE 108 (H) 05/01/2013 1440   CALCIUM 9.7 05/27/2024 0308   CALCIUM 9.8 05/01/2013 1440   CBC:    Component Value Date/Time   WBC 14.2 (H) 06/01/2024 0632   HGB 7.0 (L) 06/01/2024 0632   HGB 8.3 (L) 02/09/2024 1433   HCT 19.6 (L) 06/01/2024 9367  HCT 25.2 (L) 02/09/2024 1433   PLT 524 (H) 06/01/2024 0632   PLT 359 02/09/2024 1433   MCV 87.9 06/01/2024 0632   MCV 98 (H) 02/09/2024 1433   MCV 89 05/01/2013 1440   NEUTROABS 4.9 05/28/2024 0727   NEUTROABS 7.3 (H) 02/09/2024 1433   NEUTROABS 7.9 05/30/2012 1958   LYMPHSABS 2.8 05/28/2024 0727   LYMPHSABS 2.8 02/09/2024 1433   LYMPHSABS 5.0 05/30/2012 1958   MONOABS 1.7 (H) 05/28/2024 0727   MONOABS 1.6 (H) 05/30/2012 1958   EOSABS 0.5 05/28/2024 0727   EOSABS 0.3 02/09/2024 1433   EOSABS 0.6 05/30/2012 1958   BASOSABS 0.1 05/28/2024 0727   BASOSABS  0.1 02/09/2024 1433   BASOSABS 3 04/28/2013 1736   BASOSABS 0.2 (H) 05/30/2012 1958    Recent Results (from the past 240 hours)  Respiratory (~20 pathogens) panel by PCR     Status: None   Collection Time: 05/28/24  6:43 PM   Specimen: Nasopharyngeal Swab; Respiratory  Result Value Ref Range Status   Adenovirus NOT DETECTED NOT DETECTED Final   Coronavirus 229E NOT DETECTED NOT DETECTED Final    Comment: (NOTE) The Coronavirus on the Respiratory Panel, DOES NOT test for the novel  Coronavirus (2019 nCoV)    Coronavirus HKU1 NOT DETECTED NOT DETECTED Final   Coronavirus NL63 NOT DETECTED NOT DETECTED Final   Coronavirus OC43 NOT DETECTED NOT DETECTED Final   Metapneumovirus NOT DETECTED NOT DETECTED Final   Rhinovirus / Enterovirus NOT DETECTED NOT DETECTED Final   Influenza A NOT DETECTED NOT DETECTED Final   Influenza B NOT DETECTED NOT DETECTED Final   Parainfluenza Virus 1 NOT DETECTED NOT DETECTED Final   Parainfluenza Virus 2 NOT DETECTED NOT DETECTED Final   Parainfluenza Virus 3 NOT DETECTED NOT DETECTED Final   Parainfluenza Virus 4 NOT DETECTED NOT DETECTED Final   Respiratory Syncytial Virus NOT DETECTED NOT DETECTED Final   Bordetella pertussis NOT DETECTED NOT DETECTED Final   Bordetella Parapertussis NOT DETECTED NOT DETECTED Final   Chlamydophila pneumoniae NOT DETECTED NOT DETECTED Final   Mycoplasma pneumoniae NOT DETECTED NOT DETECTED Final    Comment: Performed at [facility], [address], [place], [place] [address]    Studies/Results: No results found.    Medications: Scheduled Meds:  amoxicillin -clavulanate  1 tablet Oral Q12H   HYDROmorphone    Intravenous Q4H   hydroxyurea   1,500 mg Oral Daily   oxymetazoline   1 spray Each Nare BID   pantoprazole   40 mg Oral BID   prazosin   1 mg Oral QHS   pregabalin   150 mg Oral TID   senna-docusate  1 tablet Oral BID   sodium chloride  flush  10-40 mL Intracatheter Q12H   Continuous  Infusions:   PRN Meds:.diphenhydrAMINE , guaiFENesin -dextromethorphan , menthol , naloxone  **AND** sodium chloride  flush, ondansetron  (ZOFRAN ) IV, oxyCODONE , phenol, polyethylene glycol, sodium chloride  flush  Consultants: None   Procedures: Blood transfusion  Antibiotics: None  Assessment/Plan: Principal Problem:   Sickle cell anemia with crisis (HCC) Active Problems:   Chronic pain syndrome   GAD (generalized anxiety disorder)   Mild intermittent asthma without complication   Anemia of chronic disease   Dysuria   Hemoptysis  Hb Sickle Cell Disease with Pain crisis: Pain is gradually improving, continue IVF 0.45% Saline @KVO , continue weight based Dilaudid  PCA, IV Toradol  15 mg Q 6 H for a total of 5 days, continue oral home pain medications as ordered. Monitor vitals very closely, Re-evaluate pain scale regularly, 2 L of  Oxygen by [place]. Patient encouraged to ambulate on the hallway today.  Hemoptysis: Most likely from throat, not necessarily hemoptysis. Chest x-ray negative for acute disease, CT angio chest pulmonary embolism showed No definite evidence of pulmonary embolus. No definite abnormality seen in the chest.  Will give empiric antibiotics, Augmentin . Anemia of Chronic Disease: Patient is status post transfusion of 2 units of packed red blood cells.  Hemoglobin is 7.0 today. Will continue to monitor. Chronic pain Syndrome: Continue oral home pain medication as ordered Generalized anxiety disorder: Clinically stable. Patient denies any suicidal ideations or thoughts. Will continue with her home medication.  Code Status: Full Code Family Communication: N/A Disposition Plan: Not yet ready for discharge  Cypher Paule MD  If 7PM-7AM, please contact night-coverage.  06/01/2024, 3:11 PM  LOS: 5 days

## 2024-06-02 DIAGNOSIS — D5700 Hb-SS disease with crisis, unspecified: Secondary | ICD-10-CM | POA: Diagnosis not present

## 2024-06-02 LAB — QUANTIFERON-TB GOLD PLUS: QuantiFERON-TB Gold Plus: NEGATIVE

## 2024-06-02 LAB — QUANTIFERON-TB GOLD PLUS (RQFGPL)
QuantiFERON Mitogen Value: >10 [IU]/mL
QuantiFERON Nil Value: 0.02 [IU]/mL
QuantiFERON TB1 Ag Value: 0.02 [IU]/mL
QuantiFERON TB2 Ag Value: 0.02 [IU]/mL

## 2024-06-02 MED ORDER — AMOXICILLIN-POT CLAVULANATE 875-125 MG PO TABS: 1.0000 | ORAL_TABLET | Freq: Two times a day (BID) | ORAL | 0 refills | Status: AC

## 2024-06-02 MED ORDER — GUAIFENESIN-DM 100-10 MG/5ML PO SYRP: 5.0000 mL | ORAL_SOLUTION | ORAL | 0 refills | Status: AC | PRN

## 2024-06-02 NOTE — Discharge Summary (Signed)
 Physician Discharge Summary  Bailey Reese FMW:[id number] DOB: 12-07-2005 DOA: 05/27/2024  PCP: Oley Bascom RAMAN, NP  Admit date: 05/27/2024  Discharge date: 06/02/2024  Discharge Diagnoses:  Principal Problem:   Sickle cell anemia with crisis (HCC) Active Problems:   Chronic pain syndrome   GAD (generalized anxiety disorder)   Mild intermittent asthma without complication   Anemia of chronic disease   Dysuria   Hemoptysis   Discharge Condition: Stable  Disposition:   Follow-up Information     Oley Bascom RAMAN, NP. Call in 1 week(s).   Specialties: Pulmonary Disease, Endocrinology Contact information: [address] [place] [address] [phone]                Pt is discharged home in good condition and is to follow up with Oley Bascom RAMAN, NP this week to have labs evaluated. Bailey Reese is instructed to increase activity slowly and balance with rest for the next few days, and use prescribed medication to complete treatment of pain  Diet: Regular Wt Readings from Last 3 Encounters:  05/27/24 70.3 kg  05/27/24 70.3 kg  04/16/24 70.3 kg    History of present illness:  Bailey Reese  is a 19 y.o. female with medical history that is significant for sickle cell disease, PTSD, major depression and generalized anxiety disorder, chronic pain syndrome, and anemia of chronic disease who usually gets her medical care at [facility] but in the process of transferring her care to [facility] health, was seen earlier this morning at [facility] emergency room for major complaints of generalized bodyaches that is typical of her sickle cell pain crisis.  She took her home pain medications at 1 AM with no sustained relief.  Pain is worse in her lower extremities bilaterally, occasional chest pain but no shortness of breath.  Patient was last discharged from the hospital on 04/22/2024.  She is rating her pain at 10/10, constant and throbbing.  She reports no inciting  factor but attributes current crisis to changing weather.  She denies any fever, cough, shortness of breath, nausea, vomiting or diarrhea.  No urinary symptoms.  She denies any recent travels, sick contacts, or known exposure to COVID-19/the flu.   ED course: Patient was seen to be resting comfortably, awake, in no distress.  Vital signs showed: Heart rate of 93, respiration 16, temperature 98.9 F, SpO2 94% on room air, with a recorded blood pressure of 156/66.  Immediate labs showed: Unremarkable comprehensive metabolic panel except for total bilirubin of 4.4 which is slightly lower than her baseline.  Her CBC with differential is pending.  She received multiple doses of IV Dilaudid  and other adjunct therapies in the emergency room with no relief of sickle cell pain.  Patient will be admitted to [facility] for further evaluation and management of sickle cell pain crisis.  Hospital Course:  Patient was admitted for sickle cell pain crisis and managed appropriately with IVF, IV Dilaudid  via PCA and IV Toradol , as well as other adjunct therapies per sickle cell pain management protocols.  Patient was noticed to be picking her skin to the extent that some part of her skin bleeds which is part of her symptoms of significant generalized anxiety disorder.  She also complained of hemoptysis during this admission, she was extensively worked up with no significant finding.  CT angiogram of the chest showed no definitive evidence of pulmonary embolism or any acute cardiopulmonary process.  Chest x-ray shows normal heart  and mediastinal contours, clear lungs and unremarkable skeletal structures.  Tuberculosis was ruled out with a negative QuantiFERON testing x 2.  Her hemoglobin did drop to a nadir of 6.2 for which she was transfused with a total of 2 units of packed red blood cells.  Her hemoptysis slowly resolved after starting an empiric antibiotics of Augmentin .  Cough subsided.  Patient breathing well and  saturating above 95% on room air.  Patient improved symptomatically.  She denies any suicidal ideations or thoughts.  She was successfully weaned off Dilaudid  via PCA and transitioned to her home medications.  As at today, patient is ambulating well with no significant pain, tolerating p.o. intake with no significant restrictions.  Patient has remained hemodynamically stable throughout his admission. Patient was therefore discharged home today in a hemodynamically stable condition.   Bailey Reese will follow-up with PCP within 1 week of this discharge. Bailey Reese was counseled extensively about nonpharmacologic means of pain management, patient verbalized understanding and was appreciative of  the care received during this admission.   We discussed the need for good hydration, monitoring of hydration status, avoidance of heat, cold, stress, and infection triggers. We discussed the need to be adherent with taking Hydrea  and other home medications. Patient was reminded of the need to seek medical attention immediately if any symptom of bleeding, anemia, or infection occurs.  Discharge Exam: Vitals:   06/02/24 1024 06/02/24 1208  BP: 98/60   Pulse: 78   Resp: 14 15  Temp: 98.2 F (36.8 C)   SpO2: 100% 99%   Vitals:   06/02/24 0334 06/02/24 0805 06/02/24 1024 06/02/24 1208  BP:   98/60   Pulse:   78   Resp: 15 15 14 15   Temp:   98.2 F (36.8 C)   TempSrc:   Oral   SpO2: 100% 100% 100% 99%  Weight:      Height:        General appearance : Awake, alert, not in any distress. Speech Clear. Not toxic looking HEENT: Atraumatic and Normocephalic, pupils equally reactive to light and accomodation Neck: Supple, no JVD. No cervical lymphadenopathy.  Chest: Good air entry bilaterally, no added sounds  CVS: S1 S2 regular, no murmurs.  Abdomen: Bowel sounds present, Non tender and not distended with no gaurding, rigidity or rebound. Extremities: B/L Lower Ext shows no edema, both legs are warm to  touch Neurology: Awake alert, and oriented X 3, CN II-XII intact, Non focal Skin: No Rash  Discharge Instructions  Discharge Instructions     Increase activity slowly   Complete by: As directed       Allergies as of 06/02/2024       Reactions   Cherry Anaphylaxis, Itching, Swelling   Droperidol  Anxiety, Other (See Comments)   Droperidol  is a medication that prevents nausea and vomiting caused by surgical procedures. The brand name of this medication is Inapsine . = Pt states muscle tension, involuntary movements, eye drifting, and SEIZURES   Olanzapine  Other (See Comments)   Drug-induced liver injury   Other Anaphylaxis, Itching, Swelling, Other (See Comments)   Pitted fruits - Throat itches ALSO- THE PATIENT IS PRONE TO HAVE SEIZURES.   Peanut-containing Drug Products Anaphylaxis, Itching, Swelling   Plum Pulp Anaphylaxis, Hives   Morphine  And Codeine Hives, Itching   Peach [prunus Persica] Itching, Swelling, Other (See Comments)   Acetaminophen  Nausea And Vomiting   Compazine  [prochlorperazine ] Other (See Comments), Hypertension   Patient stated that this medication causes her HR to increase  and well as BP. She also states that it cause hallucinations.   Ibuprofen  Nausea And Vomiting        Medication List     TAKE these medications    amoxicillin -clavulanate 875-125 MG tablet Commonly known as: AUGMENTIN  Take 1 tablet by mouth every 12 (twelve) hours.   budesonide-formoterol  80-4.5 MCG/ACT inhaler Commonly known as: SYMBICORT Inhale 2 puffs into the lungs in the morning and at bedtime.   CALCIUM 600+D3 PO Take 2 tablets by mouth daily.   ergocalciferol  1.25 MG (50000 UT) capsule Commonly known as: VITAMIN D2 Take 1 capsule (50,000 Units total) by mouth once a week. What changed: when to take this   esomeprazole  40 MG capsule Commonly known as: NEXIUM  Take 1 capsule (40 mg total) by mouth daily as needed (reflux).   fexofenadine 180 MG  tablet Commonly known as: ALLEGRA Take 180 mg by mouth in the morning.   guaiFENesin -dextromethorphan  100-10 MG/5ML syrup Commonly known as: ROBITUSSIN DM Take 5 mLs by mouth every 4 (four) hours as needed for cough.   hydroxyurea  500 MG capsule Commonly known as: HYDREA  Take 1,500 mg by mouth daily. May take with food to minimize GI side effects.   hydrOXYzine  25 MG tablet Commonly known as: ATARAX  Take 25 mg by mouth See admin instructions. Take 25 mg by mouth twice a day and an additional 25 mg once a day as needed for anxiety   lubiprostone  24 MCG capsule Commonly known as: AMITIZA  Take 24 mcg by mouth in the morning and at bedtime.   ondansetron  4 MG tablet Commonly known as: ZOFRAN  Take 4 mg by mouth See admin instructions. Take 4 mg by mouth up to 4 times a day   Oxycodone  HCl 10 MG Tabs Take 1 tablet (10 mg total) by mouth every 6 (six) hours as needed (pain).   pantoprazole  40 MG tablet Commonly known as: Protonix  Take 1 tablet (40 mg total) by mouth 2 (two) times daily. What changed:  when to take this reasons to take this   prazosin  1 MG capsule Commonly known as: MINIPRESS  Take 1 mg by mouth at bedtime.   pregabalin  150 MG capsule Commonly known as: LYRICA  Take 1 capsule (150 mg total) by mouth 3 (three) times daily.   scopolamine  1 MG/3DAYS Commonly known as: TRANSDERM-SCOP Place 1 patch onto the skin every 3 (three) days.        The results of significant diagnostics from this hospitalization (including imaging, microbiology, ancillary and laboratory) are listed below for reference.    Significant Diagnostic Studies: CT Angio Chest Pulmonary Embolism (PE) W or WO Contrast Result Date: 05/30/2024 CLINICAL DATA:  Hemoptysis EXAM: CT ANGIOGRAPHY CHEST WITH CONTRAST TECHNIQUE: Multidetector CT imaging of the chest was performed using the standard protocol during bolus administration of intravenous contrast. Multiplanar CT image reconstructions and MIPs  were obtained to evaluate the vascular anatomy. RADIATION DOSE REDUCTION: This exam was performed according to the departmental dose-optimization program which includes automated exposure control, adjustment of the mA and/or kV according to patient size and/or use of iterative reconstruction technique. CONTRAST:  75mL OMNIPAQUE  IOHEXOL  350 MG/ML SOLN COMPARISON:  Yesterday FINDINGS: Cardiovascular: Satisfactory opacification of the pulmonary arteries to the segmental level. No evidence of pulmonary embolism. Normal heart size. No pericardial effusion. Mediastinum/Nodes: No enlarged mediastinal, hilar, or axillary lymph nodes. Thyroid gland, trachea, and esophagus demonstrate no significant findings. Lungs/Pleura: Lungs are clear. No pleural effusion or pneumothorax. Upper Abdomen: No acute abnormality. Musculoskeletal: No chest wall abnormality.  No acute or significant osseous findings. Review of the MIP images confirms the above findings. IMPRESSION: No definite evidence of pulmonary embolus. No definite abnormality seen in the chest. Electronically Signed   By: Lynwood Landy Raddle M.D.   On: 05/30/2024 10:58   DG CHEST PORT 1 VIEW Result Date: 05/29/2024 CLINICAL DATA:  Hemoptysis. EXAM: PORTABLE CHEST 1 VIEW COMPARISON:  Chest radiograph dated 05/27/2024. FINDINGS: The heart size and mediastinal contours are within normal limits. Both lungs are clear. The visualized skeletal structures are unremarkable. IMPRESSION: No active disease. Electronically Signed   By: Vanetta Chou M.D.   On: 05/29/2024 13:27   DG Chest 2 View Result Date: 05/27/2024 EXAM: 2 VIEW(S) XRAY OF THE CHEST 05/27/2024 03:34:00 AM COMPARISON: 04/16/2024 CLINICAL HISTORY: Chest pain FINDINGS: LUNGS AND PLEURA: Bibasilar linear atelectasis/scarring. No pleural effusion. No pneumothorax. HEART AND MEDIASTINUM: No acute abnormality of the cardiac and mediastinal silhouettes. BONES AND SOFT TISSUES: Right upper quadrant surgical clips noted. No  acute osseous abnormality. IMPRESSION: 1. No acute cardiopulmonary abnormality. Electronically signed by: Pinkie Pebbles MD MD 05/27/2024 03:38 AM EST RP Workstation: [id number]    Microbiology: Recent Results (from the past 240 hours)  Respiratory (~20 pathogens) panel by PCR     Status: None   Collection Time: 05/28/24  6:43 PM   Specimen: Nasopharyngeal Swab; Respiratory  Result Value Ref Range Status   Adenovirus NOT DETECTED NOT DETECTED Final   Coronavirus 229E NOT DETECTED NOT DETECTED Final    Comment: (NOTE) The Coronavirus on the Respiratory Panel, DOES NOT test for the novel  Coronavirus (2019 nCoV)    Coronavirus HKU1 NOT DETECTED NOT DETECTED Final   Coronavirus NL63 NOT DETECTED NOT DETECTED Final   Coronavirus OC43 NOT DETECTED NOT DETECTED Final   Metapneumovirus NOT DETECTED NOT DETECTED Final   Rhinovirus / Enterovirus NOT DETECTED NOT DETECTED Final   Influenza A NOT DETECTED NOT DETECTED Final   Influenza B NOT DETECTED NOT DETECTED Final   Parainfluenza Virus 1 NOT DETECTED NOT DETECTED Final   Parainfluenza Virus 2 NOT DETECTED NOT DETECTED Final   Parainfluenza Virus 3 NOT DETECTED NOT DETECTED Final   Parainfluenza Virus 4 NOT DETECTED NOT DETECTED Final   Respiratory Syncytial Virus NOT DETECTED NOT DETECTED Final   Bordetella pertussis NOT DETECTED NOT DETECTED Final   Bordetella Parapertussis NOT DETECTED NOT DETECTED Final   Chlamydophila pneumoniae NOT DETECTED NOT DETECTED Final   Mycoplasma pneumoniae NOT DETECTED NOT DETECTED Final    Comment: Performed at [facility], [address], [place], [place] [address]     Labs: Basic Metabolic Panel: Recent Labs  Lab 05/27/24 0308  NA 137  K 4.0  CL 103  CO2 19*  GLUCOSE 82  BUN 7  CREATININE 0.52  CALCIUM 9.7   Liver Function Tests: Recent Labs  Lab 05/27/24 0308 05/31/24 1037  AST 34 36  ALT 20 23  ALKPHOS 135* 126  BILITOT 4.4* 3.0*  PROT 8.5* 6.9  ALBUMIN 4.5 3.6   No  results for input(s): LIPASE, AMYLASE in the last 168 hours. No results for input(s): AMMONIA in the last 168 hours. CBC: Recent Labs  Lab 05/28/24 0727 05/28/24 1903 05/29/24 0742 05/30/24 0722 05/31/24 0807 06/01/24 0632  WBC 10.0  --  10.6* 12.4* 13.7* 14.2*  NEUTROABS 4.9  --   --   --   --   --   HGB 6.2* 7.9* 7.4* 7.4* 7.3* 7.0*  HCT 17.7* 22.5* 21.2* 21.4* 21.0*  19.6*  MCV 91.2  --  86.5 87.0 88.6 87.9  PLT 503*  --  446* 467* 481* 524*   Cardiac Enzymes: No results for input(s): CKTOTAL, CKMB, CKMBINDEX, TROPONINI in the last 168 hours. BNP: Invalid input(s): POCBNP CBG: No results for input(s): GLUCAP in the last 168 hours.  Time coordinating discharge: 50 minutes  Signed:  Jaren Vanetten  [facility] 06/02/2024, 1:29 PM

## 2024-06-02 NOTE — Plan of Care (Signed)
  Problem: Activity: Goal: Risk for activity intolerance will decrease Outcome: Progressing   Problem: Pain Managment: Goal: General experience of comfort will improve and/or be controlled Outcome: Progressing   Problem: Safety: Goal: Ability to remain free from injury will improve Outcome: Progressing   Problem: Skin Integrity: Goal: Risk for impaired skin integrity will decrease Outcome: Progressing

## 2024-06-02 NOTE — Progress Notes (Signed)
 Discharge instructions gone over with patient. Next medications due written on medication sheet. Reminded June she has a prescription to pick up at her pharmacy. Still rating her pain 8/10. She was discharged via wheelchair. Family picking patient up to bring home. Appetite was good. Love was using the bedside commode.

## 2024-06-02 NOTE — Plan of Care (Signed)
" °  Problem: Health Behavior/Discharge Planning: Goal: Ability to manage health-related needs will improve Outcome: Progressing   Problem: Clinical Measurements: Goal: Ability to maintain clinical measurements within normal limits will improve Outcome: Progressing Goal: Respiratory complications will improve Outcome: Progressing   Problem: Activity: Goal: Risk for activity intolerance will decrease Outcome: Progressing   Problem: Pain Managment: Goal: General experience of comfort will improve and/or be controlled Outcome: Progressing   Problem: Respiratory: Goal: Pulmonary complications will be avoided or minimized Outcome: Progressing   Problem: Sensory: Goal: Pain level will decrease with appropriate interventions Outcome: Progressing   "

## 2024-06-03 ENCOUNTER — Telehealth: Payer: Self-pay

## 2024-06-03 NOTE — Transitions of Care (Post Inpatient/ED Visit) (Signed)
" ° °  06/03/2024  Name: Bailey Reese MRN: [id number] DOB: 08-18-2005  Today's TOC FU Call Status: Today's TOC FU Call Status:: Unsuccessful Call (1st Attempt) Unsuccessful Call (1st Attempt) Date: 06/03/24  Attempted to reach the patient regarding the most recent Inpatient/ED visit.  Follow Up Plan: Additional outreach attempts will be made to reach the patient to complete the Transitions of Care (Post Inpatient/ED visit) call.    Arvin Seip RN, BSN, CCM [employer], Population Health Case Manager Phone: [phone]  "

## 2024-06-04 ENCOUNTER — Telehealth: Payer: Self-pay

## 2024-06-04 NOTE — Transitions of Care (Post Inpatient/ED Visit) (Signed)
" ° °  06/04/2024  Name: Bailey Reese MRN: [id number] DOB: [birth year]  Today's TOC FU Call Status: Today's TOC FU Call Status:: Unsuccessful Call (2nd Attempt) Unsuccessful Call (2nd Attempt) Date: 06/04/24  Attempted to reach the patient regarding the most recent Inpatient/ED visit.  Follow Up Plan: Additional outreach attempts will be made to reach the patient to complete the Transitions of Care (Post Inpatient/ED visit) call.   Arvin Seip RN, BSN, CCM [employer], Population Health Case Manager Phone: [phone]  "

## 2024-06-05 ENCOUNTER — Telehealth: Payer: Self-pay

## 2024-06-05 NOTE — Transitions of Care (Post Inpatient/ED Visit) (Signed)
" ° °  06/05/2024  Name: Bailey Reese MRN: [id number] DOB: 2005-08-08  Today's TOC FU Call Status: Today's TOC FU Call Status:: Unsuccessful Call (3rd Attempt) Unsuccessful Call (3rd Attempt) Date: 06/05/24  Attempted to reach the patient regarding the most recent Inpatient/ED visit.  Follow Up Plan: No further outreach attempts will be made at this time. We have been unable to contact the patient.  Arvin Seip RN, BSN, CCM [employer], Population Health Case Manager Phone: [phone]  "

## 2024-06-06 ENCOUNTER — Emergency Department

## 2024-06-06 ENCOUNTER — Emergency Department
Admission: EM | Admit: 2024-06-06 | Discharge: 2024-06-06 | Disposition: A | Discharge status: Home / Self Care | Source: Home / Self Care

## 2024-06-06 ENCOUNTER — Other Ambulatory Visit: Payer: Self-pay

## 2024-06-06 DIAGNOSIS — R109 Unspecified abdominal pain: Secondary | ICD-10-CM | POA: Diagnosis present

## 2024-06-06 DIAGNOSIS — D5700 Hb-SS disease with crisis, unspecified: Secondary | ICD-10-CM | POA: Insufficient documentation

## 2024-06-06 LAB — RETICULOCYTES
Immature Retic Fract: 39.9 % — ABNORMAL HIGH (ref 2.3–15.9)
RBC.: 2.3 MIL/uL — ABNORMAL LOW (ref 3.87–5.11)
Retic Count, Absolute: 401.5 K/uL — ABNORMAL HIGH (ref 19.0–186.0)
Retic Ct Pct: 17.5 % — ABNORMAL HIGH (ref 0.4–3.1)

## 2024-06-06 LAB — CBC WITH DIFFERENTIAL/PLATELET
Abs Immature Granulocytes: 0.05 K/uL (ref 0.00–0.07)
Basophils Absolute: 0.2 K/uL — ABNORMAL HIGH (ref 0.0–0.1)
Basophils Relative: 1 %
Eosinophils Absolute: 0.2 K/uL (ref 0.0–0.5)
Eosinophils Relative: 1 %
HCT: 29.5 % — ABNORMAL LOW (ref 36.0–46.0)
Hemoglobin: 8.9 g/dL — ABNORMAL LOW (ref 12.0–15.0)
Immature Granulocytes: 0 %
Lymphocytes Relative: 26 %
Lymphs Abs: 3 K/uL (ref 0.7–4.0)
MCH: 30.7 pg (ref 26.0–34.0)
MCHC: 30.2 g/dL (ref 30.0–36.0)
MCV: 101.7 fL — ABNORMAL HIGH (ref 80.0–100.0)
Monocytes Absolute: 1.1 K/uL — ABNORMAL HIGH (ref 0.1–1.0)
Monocytes Relative: 10 %
Neutro Abs: 7.2 K/uL (ref 1.7–7.7)
Neutrophils Relative %: 62 %
Platelets: 449 K/uL — ABNORMAL HIGH (ref 150–400)
RBC: 2.9 MIL/uL — ABNORMAL LOW (ref 3.87–5.11)
RDW: 22 % — ABNORMAL HIGH (ref 11.5–15.5)
Smear Review: NORMAL
WBC: 11.7 K/uL — ABNORMAL HIGH (ref 4.0–10.5)
nRBC: 1.7 % — ABNORMAL HIGH (ref 0.0–0.2)

## 2024-06-06 LAB — COMPREHENSIVE METABOLIC PANEL WITH GFR
ALT: 36 U/L (ref 0–44)
AST: 45 U/L — ABNORMAL HIGH (ref 15–41)
Albumin: 4.1 g/dL (ref 3.5–5.0)
Alkaline Phosphatase: 130 U/L — ABNORMAL HIGH (ref 38–126)
Anion gap: 14 (ref 5–15)
BUN: 7 mg/dL (ref 6–20)
CO2: 18 mmol/L — ABNORMAL LOW (ref 22–32)
Calcium: 9.3 mg/dL (ref 8.9–10.3)
Chloride: 104 mmol/L (ref 98–111)
Creatinine, Ser: 0.46 mg/dL (ref 0.44–1.00)
GFR, Estimated: >60 mL/min
Glucose, Bld: 78 mg/dL (ref 70–99)
Potassium: 4 mmol/L (ref 3.5–5.1)
Sodium: 136 mmol/L (ref 135–145)
Total Bilirubin: 3.3 mg/dL — ABNORMAL HIGH (ref 0.0–1.2)
Total Protein: 8 g/dL (ref 6.5–8.1)

## 2024-06-06 LAB — HCG, QUANTITATIVE, PREGNANCY: hCG, Beta Chain, Quant, S: <1 m[IU]/mL

## 2024-06-06 MED ORDER — OXYCODONE HCL 5 MG PO TABS
10.0000 mg | ORAL_TABLET | Freq: Once | ORAL | Status: AC
  Administered 2024-06-06: 10 mg via ORAL
  Filled 2024-06-06: qty 2

## 2024-06-06 MED ORDER — KETOROLAC TROMETHAMINE 15 MG/ML IJ SOLN
15.0000 mg | Freq: Once | INTRAMUSCULAR | Status: AC
  Administered 2024-06-06: 15 mg via INTRAVENOUS
  Filled 2024-06-06: qty 1

## 2024-06-06 MED ORDER — HYDROMORPHONE HCL 1 MG/ML IJ SOLN
2.0000 mg | Freq: Once | INTRAMUSCULAR | Status: AC
  Administered 2024-06-06: 2 mg via INTRAVENOUS
  Filled 2024-06-06: qty 2

## 2024-06-06 MED ORDER — SODIUM CHLORIDE 0.9 % IV BOLUS
1000.0000 mL | Freq: Once | INTRAVENOUS | Status: AC
  Administered 2024-06-06: 1000 mL via INTRAVENOUS

## 2024-06-06 MED ORDER — DIPHENHYDRAMINE HCL 25 MG PO CAPS
50.0000 mg | ORAL_CAPSULE | Freq: Once | ORAL | Status: AC
  Administered 2024-06-06: 50 mg via ORAL
  Filled 2024-06-06: qty 2

## 2024-06-06 MED ORDER — SODIUM CHLORIDE 0.9 % IV SOLN
25.0000 mg | Freq: Four times a day (QID) | INTRAVENOUS | Status: DC | PRN
  Administered 2024-06-06: 25 mg via INTRAVENOUS
  Filled 2024-06-06: qty 1

## 2024-06-06 MED ORDER — HYDROMORPHONE HCL 1 MG/ML IJ SOLN
2.0000 mg | INTRAMUSCULAR | Status: AC | PRN
  Administered 2024-06-06 (×2): 2 mg via INTRAVENOUS
  Filled 2024-06-06 (×2): qty 2

## 2024-06-06 MED ORDER — HYDROMORPHONE HCL 1 MG/ML IJ SOLN
2.0000 mg | INTRAMUSCULAR | Status: DC | PRN
  Administered 2024-06-06: 2 mg via INTRAVENOUS
  Filled 2024-06-06: qty 2

## 2024-06-06 NOTE — ED Notes (Signed)
 Declined by [facility] due to capacity

## 2024-06-06 NOTE — ED Triage Notes (Signed)
 Patient brought in by EMS from home for LUQ abd pain with N/V/D since yesterday. Sick contacts at home with similar symptoms. Hx sickle cell, pt states she feels like she's in crisis with generalized body pain 9/10. Patient took oxycodone  10mg  2 hours ago without improvement, states she threw up after taking med. No meds given by EMS. AxOx4 in triage.

## 2024-06-06 NOTE — ED Notes (Signed)
 Lab at bedside for blood draw.

## 2024-06-06 NOTE — ED Provider Notes (Signed)
 Clinical Course as of 06/06/24 1109  Thu Jun 06, 2024  0704 [ ]  Sickle cell pain crisis. Getting over a cold. CXR. []   reassess/  [HD]  0739 DG Chest Port 1 View No acute abnormality on my independent review interpretation radiologist agrees [HD]  [phone] Comprehensive metabolic panel Specimen hemolyzed we will need to recollect [HD]  0745 Patient resting, sleeping did not awake.  Nursing aware to recollect labs [HD]  [phone] Patient reassessed, still endorsing pain, does not feel ready for discharge, will administer third dose of Dilaudid  and likely talk to [facility] Law [HD]  9096 Reaching out to [facility]ryle Law [HD]  9041 Case discussed reviewed with [facility] Law sickle cell consultant  Cherylene Poling who discussed with supervisor, Jegede, Olugbemiga MD. patient not appropriate for transfer.  They have reviewed the labs and feel that she is safe to return home.  Will talk to patient [HD]  1011 Of note, patient has already taken p.o. without vomiting and when I reviewed the recommendations from [place] along with the patient she voiced frustration and stated that she was not ready to return home at this time.  Repeat abdominal exam is benign.  She does request her home medications which I will order.  She does request that I try reaching out to [facility] sickle team, which I will do so [HD]  1017 [facility] states they will page it out to medicine [HD]  1043 Declined by [facility] secondary to capacity [HD]  1045 Patient reassessed and she has taken p.o. and feels comfortable at this time returning home she has enough medications and declines any refills.  Will work on discharge [HD]    Clinical Course User Index [HD] Nicholaus Rolland BRAVO, MD   Of note, I have held multiple discussions with the patient regarding her workup and my discussion with the sickle cell team at [facility] health.  Patient's main concern is having emesis at home.  Of note she has tolerated p.o. here and has not had any active emesis in the room.  She was able to  take her home pain medications without difficulty.  Per her request I did reach out to [facility] but [facility] does not have capacity to take this patient at this time.  Patient was advised to call her sickle cell clinic today which she stated willingness to do so and feels comfortable returning home  At time of discharge there is no evidence of acute life, limb, vision, or fertility threat. Patient has stable vital signs, pain is well controlled, patient is ambulatory and p.o. tolerant.  Discharge instructions were completed using the EPIC system. I would refer you to those at this time. All warnings prescriptions follow-up etc. were discussed in detail with the patient. Patient indicates understanding and is agreeable with this plan. All questions answered.  Patient is made aware that they may return to the emergency department for any worsening or new condition or for any other emergency.  .Critical Care  Performed by: Nicholaus Rolland BRAVO, MD Authorized by: Nicholaus Rolland BRAVO, MD   Critical care provider statement:    Critical care time (minutes):  35   Critical care was necessary to treat or prevent imminent or life-threatening deterioration of the following conditions:  Metabolic crisis   Critical care was time spent personally by me on the following activities:  Development of treatment plan with patient or surrogate, discussions with consultants, evaluation of patient's response to treatment, examination of patient, ordering and review of laboratory studies, ordering and  review of radiographic studies, ordering and performing treatments and interventions, pulse oximetry, re-evaluation of patient's condition and review of old charts Comments:     Sicke Cell pain crisis requiring large doses of IV pain medications (Dilaudid , frequent reassessments, multiple talks with consultants)     Nicholaus Rolland BRAVO, MD 06/06/24 [phone]

## 2024-06-06 NOTE — ED Notes (Signed)
 Called [facility] spoke to [place] for transfer

## 2024-06-06 NOTE — ED Provider Notes (Signed)
 "  [facility] Provider Note    Event Date/Time   First MD Initiated Contact with Patient 06/06/24 [phone]     (approximate)   History   Abdominal Pain   HPI  Bailey Reese is a 19 y.o. female   Past medical history of sickle cell disease, anxiety and depression, PTSD, here with pain.  She has chronic pain but it has acutely worsened over the last 2 days.  It is typical of her sickle cell pain crises and that is located in her lower back, and bilateral upper and lower extremities.  She was hospitalized earlier this month with similar pain crisis at [facility].  She states that last week she was getting over a cold that was running through her family members, and had a mild cough but is now resolving.  She states no cough currently, no shortness of breath and no chest pain.  She had a fever with her recent illness but is afebrile today.  She denies any urinary symptoms, nausea vomiting or diarrhea.   External Medical Documents Reviewed: Notes from recent hospitalization for sickle cell pain crisis      Physical Exam   Triage Vital Signs: ED Triage Vitals  Encounter Vitals Group     BP      Girls Systolic BP Percentile      Girls Diastolic BP Percentile      Boys Systolic BP Percentile      Boys Diastolic BP Percentile      Pulse      Resp      Temp      Temp src      SpO2      Weight      Height      Head Circumference      Peak Flow      Pain Score      Pain Loc      Pain Education      Exclude from Growth Chart     Most recent vital signs: Vitals:   06/06/24 0558 06/06/24 0615  BP: 113/63 (!) 107/54  Pulse: 79 72  Resp: 16 16  Temp:    SpO2: 100% 100%    General: Awake, no distress.  CV:  Good peripheral perfusion.  Resp:  Normal effort.  Abd:  No distention.  Other:  Awake normal vital signs nontoxic-appearing.  No fever.  Breathing comfortably on room air.  Clear lungs without focality or wheezing.  Soft nontender  nonperitoneal abdominal exam.  Skin appears warm well-perfused.  No leg swelling.  Able to range bilateral arms and legs at all joints with full active range of motion.   ED Results / Procedures / Treatments   Labs (all labs ordered are listed, but only abnormal results are displayed) Labs Reviewed  CBC WITH DIFFERENTIAL/PLATELET - Abnormal; Notable for the following components:      Result Value   WBC 11.7 (*)    RBC 2.90 (*)    Hemoglobin 8.9 (*)    HCT 29.5 (*)    MCV 101.7 (*)    RDW 22.0 (*)    Platelets 449 (*)    nRBC 1.7 (*)    Monocytes Absolute 1.1 (*)    Basophils Absolute 0.2 (*)    All other components within normal limits  COMPREHENSIVE METABOLIC PANEL WITH GFR  HCG, QUANTITATIVE, PREGNANCY  RETICULOCYTES  POC URINE PREG, ED     I ordered and reviewed the above labs they  are notable for anemia but improved from prior testing   RADIOLOGY I independently reviewed and interpreted CXR and see no focality  I also reviewed radiologist's formal read.   PROCEDURES:  Critical Care performed: No  Procedures   MEDICATIONS ORDERED IN ED: Medications  promethazine  (PHENERGAN ) 25 mg in sodium chloride  0.9 % 50 mL IVPB (25 mg Intravenous New Bag/Given 06/06/24 0550)  sodium chloride  0.9 % bolus 1,000 mL (1,000 mLs Intravenous New Bag/Given 06/06/24 0515)  ketorolac  (TORADOL ) 15 MG/ML injection 15 mg (15 mg Intravenous Given 06/06/24 0514)  diphenhydrAMINE  (BENADRYL ) capsule 50 mg (50 mg Oral Given 06/06/24 0517)  HYDROmorphone  (DILAUDID ) injection 2 mg (2 mg Intravenous Given 06/06/24 0634)     IMPRESSION / MDM / ASSESSMENT AND PLAN / ED COURSE  I reviewed the triage vital signs and the nursing notes.                                Patient's presentation is most consistent with acute presentation with potential threat to life or bodily function.  Differential diagnosis includes, but is not limited to, sickle cell pain crisis, chronic pain, aplastic crisis,  respiratory infection, acute chest syndrome   The patient is on the cardiac monitor to evaluate for evidence of arrhythmia and/or significant heart rate changes.  MDM:    Here with complaints of acute on chronic pain which is typical of her sickle cell pain crises of lower back pain bilateral upper and lower extremity pain.  Will get basic labs, reticulocyte count, and treat her pain with multimodal pain control.  Reassess pain for disposition.  Notably, is just getting over evaluated by symptoms.  Today is mostly asymptomatic with no longer any cough or chest pain no shortness of breath on evaluation today.  I doubt acute chest.  Will check a chest x-ray.  Thus far workup including labs, chest x-ray unremarkable.  Patient still experiencing 8 out of 10 pain, will redose some medications and reassess.  Some labs still pending in addition to pain reassessment at the time of signout at 7 AM.  Signed out to morning provider with disposition pending.      FINAL CLINICAL IMPRESSION(S) / ED DIAGNOSES   Final diagnoses:  Sickle cell anemia with pain (HCC)     Rx / DC Orders   ED Discharge Orders     None        Note:  This document was prepared using Dragon voice recognition software and may include unintentional dictation errors.    Cyrena Mylar, MD 06/06/24 [phone]  "

## 2024-06-06 NOTE — ED Notes (Signed)
 Called [facility] spoke to [place] for transfer to [facility]  [phone]

## 2024-06-06 NOTE — Discharge Instructions (Addendum)
 You was seen at our emergency department for sickle cell pain crisis.  Blood work was reassuring and your case was discussed with 2 tertiary facilities.  At this time please stick to your pain control recommendations and call your sickle cell pain clinic today in follow-up.  Return with any acutely worsening symptoms. -- RETURN PRECAUTIONS & AFTERCARE: (ENGLISH) RETURN PRECAUTIONS: Return immediately to the emergency department or see/call your doctor if you feel worse, weak or have changes in speech or vision, are short of breath, have fever, vomiting, pain, bleeding or dark stool, trouble urinating or any new issues. Return here or see/call your doctor if not improving as expected for your suspected condition. FOLLOW-UP CARE: Call your doctor and/or any doctors we referred you to for more advice and to make an appointment. Do this today, tomorrow or after the weekend. Some doctors only take PPO insurance so if you have HMO insurance you may want to contact your HMO or your regular doctor for referral to a specialist within your plan. Either way tell the doctor's office that it was a referral from the emergency department so you get the soonest possible appointment.  YOUR TEST RESULTS: Take result reports of any blood or urine tests, imaging tests and EKG's to your doctor and any referral doctor. Have any abnormal tests repeated. Your doctor or a referral doctor can let you know when this should be done. Also make sure your doctor contacts this hospital to get any test results that are not currently available such as cultures or special tests for infection and final imaging reports, which are often not available at the time you leave the ER but which may list additional important findings that are not documented on the preliminary report. BLOOD PRESSURE: If your blood pressure was greater than 120/80 have your blood pressure rechecked within 1 to 2 weeks. MEDICATION SIDE EFFECTS: Do not drive, walk, bike,  take the bus, etc. if you have received or are being prescribed any sedating medications such as those for pain or anxiety or certain antihistamines like Benadryl . If you have been give one of these here get a taxi home or have a friend drive you home. Ask your pharmacist to counsel you on potential side effects of any new medication

## 2024-06-06 NOTE — ED Notes (Signed)
 Lab tech reporting attempted x2 without success for blood draw

## 2024-06-08 ENCOUNTER — Emergency Department (HOSPITAL_COMMUNITY)

## 2024-06-08 ENCOUNTER — Other Ambulatory Visit: Payer: Self-pay

## 2024-06-08 ENCOUNTER — Encounter (HOSPITAL_COMMUNITY): Payer: Self-pay

## 2024-06-08 ENCOUNTER — Inpatient Hospital Stay (HOSPITAL_COMMUNITY)
Admission: EM | Admit: 2024-06-08 | Discharge: 2024-06-15 | DRG: 812 | Disposition: A | Discharge status: Home / Self Care | Attending: Internal Medicine | Admitting: Internal Medicine

## 2024-06-08 DIAGNOSIS — G4733 Obstructive sleep apnea (adult) (pediatric): Secondary | ICD-10-CM | POA: Diagnosis present

## 2024-06-08 DIAGNOSIS — Z885 Allergy status to narcotic agent status: Secondary | ICD-10-CM

## 2024-06-08 DIAGNOSIS — D75838 Other thrombocytosis: Secondary | ICD-10-CM | POA: Diagnosis present

## 2024-06-08 DIAGNOSIS — G935 Compression of brain: Secondary | ICD-10-CM | POA: Diagnosis present

## 2024-06-08 DIAGNOSIS — Z833 Family history of diabetes mellitus: Secondary | ICD-10-CM

## 2024-06-08 DIAGNOSIS — Z888 Allergy status to other drugs, medicaments and biological substances status: Secondary | ICD-10-CM

## 2024-06-08 DIAGNOSIS — Z79899 Other long term (current) drug therapy: Secondary | ICD-10-CM

## 2024-06-08 DIAGNOSIS — G894 Chronic pain syndrome: Secondary | ICD-10-CM | POA: Diagnosis present

## 2024-06-08 DIAGNOSIS — D638 Anemia in other chronic diseases classified elsewhere: Secondary | ICD-10-CM | POA: Diagnosis present

## 2024-06-08 DIAGNOSIS — F4310 Post-traumatic stress disorder, unspecified: Secondary | ICD-10-CM | POA: Diagnosis present

## 2024-06-08 DIAGNOSIS — Z91018 Allergy to other foods: Secondary | ICD-10-CM

## 2024-06-08 DIAGNOSIS — R1013 Epigastric pain: Secondary | ICD-10-CM

## 2024-06-08 DIAGNOSIS — D5700 Hb-SS disease with crisis, unspecified: Principal | ICD-10-CM | POA: Diagnosis present

## 2024-06-08 DIAGNOSIS — F411 Generalized anxiety disorder: Secondary | ICD-10-CM | POA: Diagnosis present

## 2024-06-08 DIAGNOSIS — Z886 Allergy status to analgesic agent status: Secondary | ICD-10-CM

## 2024-06-08 DIAGNOSIS — D72829 Elevated white blood cell count, unspecified: Secondary | ICD-10-CM | POA: Diagnosis present

## 2024-06-08 DIAGNOSIS — Z7951 Long term (current) use of inhaled steroids: Secondary | ICD-10-CM

## 2024-06-08 DIAGNOSIS — Z91010 Allergy to peanuts: Secondary | ICD-10-CM

## 2024-06-08 LAB — COMPREHENSIVE METABOLIC PANEL WITH GFR
ALT: 37 U/L (ref 0–44)
AST: 35 U/L (ref 15–41)
Albumin: 4.6 g/dL (ref 3.5–5.0)
Alkaline Phosphatase: 138 U/L — ABNORMAL HIGH (ref 38–126)
Anion gap: 11 (ref 5–15)
BUN: 5 mg/dL — ABNORMAL LOW (ref 6–20)
CO2: 24 mmol/L (ref 22–32)
Calcium: 9.7 mg/dL (ref 8.9–10.3)
Chloride: 102 mmol/L (ref 98–111)
Creatinine, Ser: 0.6 mg/dL (ref 0.44–1.00)
GFR, Estimated: >60 mL/min
Glucose, Bld: 82 mg/dL (ref 70–99)
Potassium: 3.8 mmol/L (ref 3.5–5.1)
Sodium: 137 mmol/L (ref 135–145)
Total Bilirubin: 4 mg/dL — ABNORMAL HIGH (ref 0.0–1.2)
Total Protein: 8.4 g/dL — ABNORMAL HIGH (ref 6.5–8.1)

## 2024-06-08 LAB — CBC WITH DIFFERENTIAL/PLATELET
Abs Immature Granulocytes: 0.03 10*3/uL (ref 0.00–0.07)
Basophils Absolute: 0.2 10*3/uL — ABNORMAL HIGH (ref 0.0–0.1)
Basophils Relative: 2 %
Eosinophils Absolute: 0.5 10*3/uL (ref 0.0–0.5)
Eosinophils Relative: 6 %
HCT: 24.4 % — ABNORMAL LOW (ref 36.0–46.0)
Hemoglobin: 8 g/dL — ABNORMAL LOW (ref 12.0–15.0)
Immature Granulocytes: 0 %
Lymphocytes Relative: 31 %
Lymphs Abs: 2.9 10*3/uL (ref 0.7–4.0)
MCH: 30.8 pg (ref 26.0–34.0)
MCHC: 32.8 g/dL (ref 30.0–36.0)
MCV: 93.8 fL (ref 80.0–100.0)
Monocytes Absolute: 1.4 10*3/uL — ABNORMAL HIGH (ref 0.1–1.0)
Monocytes Relative: 15 %
Neutro Abs: 4.3 10*3/uL (ref 1.7–7.7)
Neutrophils Relative %: 46 %
Platelets: 582 10*3/uL — ABNORMAL HIGH (ref 150–400)
RBC: 2.6 MIL/uL — ABNORMAL LOW (ref 3.87–5.11)
RDW: 20.5 % — ABNORMAL HIGH (ref 11.5–15.5)
WBC: 9.3 10*3/uL (ref 4.0–10.5)
nRBC: 1.8 % — ABNORMAL HIGH (ref 0.0–0.2)

## 2024-06-08 LAB — RETICULOCYTES
Immature Retic Fract: 22.6 % — ABNORMAL HIGH (ref 2.3–15.9)
RBC.: 2.5 MIL/uL — ABNORMAL LOW (ref 3.87–5.11)
Retic Count, Absolute: 362 10*3/uL — ABNORMAL HIGH (ref 19.0–186.0)
Retic Ct Pct: 14.5 % — ABNORMAL HIGH (ref 0.4–3.1)

## 2024-06-08 LAB — HCG, SERUM, QUALITATIVE: Preg, Serum: NEGATIVE

## 2024-06-08 MED ORDER — IOHEXOL 350 MG/ML SOLN
75.0000 mL | Freq: Once | INTRAVENOUS | Status: AC | PRN
  Administered 2024-06-08: 75 mL via INTRAVENOUS

## 2024-06-08 MED ORDER — DIPHENHYDRAMINE HCL 25 MG PO CAPS
25.0000 mg | ORAL_CAPSULE | ORAL | Status: DC | PRN
  Administered 2024-06-09: 25 mg via ORAL
  Filled 2024-06-08: qty 1

## 2024-06-08 MED ORDER — SODIUM CHLORIDE 0.9% FLUSH: 9.0000 mL | INTRAVENOUS | Status: DC | PRN

## 2024-06-08 MED ORDER — POLYETHYLENE GLYCOL 3350 17 G PO PACK: 17.0000 g | PACK | Freq: Every day | ORAL | Status: DC | PRN

## 2024-06-08 MED ORDER — ENOXAPARIN SODIUM 40 MG/0.4ML IJ SOSY
40.0000 mg | PREFILLED_SYRINGE | INTRAMUSCULAR | Status: DC
  Administered 2024-06-08 – 2024-06-14 (×7): 40 mg via SUBCUTANEOUS
  Filled 2024-06-08 (×7): qty 0.4

## 2024-06-08 MED ORDER — HYDROMORPHONE 1 MG/ML IV SOLN
INTRAVENOUS | Status: DC
  Administered 2024-06-09: 7 mg via INTRAVENOUS
  Administered 2024-06-09: 4.9 mg via INTRAVENOUS
  Administered 2024-06-09: 18.8 mg via INTRAVENOUS
  Administered 2024-06-09: 30 mg via INTRAVENOUS
  Administered 2024-06-09: 14 mg via INTRAVENOUS
  Administered 2024-06-09 – 2024-06-10 (×2): 30 mg via INTRAVENOUS
  Administered 2024-06-10: 16.1 mg via INTRAVENOUS
  Administered 2024-06-10: 9.1 mg via INTRAVENOUS
  Filled 2024-06-08 (×3): qty 30

## 2024-06-08 MED ORDER — PANTOPRAZOLE SODIUM 40 MG IV SOLR
40.0000 mg | Freq: Every day | INTRAVENOUS | Status: DC
  Filled 2024-06-08 (×2): qty 10

## 2024-06-08 MED ORDER — DEXTROSE-SODIUM CHLORIDE 5-0.45 % IV SOLN: INTRAVENOUS | Status: AC

## 2024-06-08 MED ORDER — SODIUM CHLORIDE 0.9 % IV BOLUS
500.0000 mL | Freq: Once | INTRAVENOUS | Status: AC
  Administered 2024-06-08: 500 mL via INTRAVENOUS

## 2024-06-08 MED ORDER — DIPHENHYDRAMINE HCL 25 MG PO CAPS
25.0000 mg | ORAL_CAPSULE | ORAL | Status: DC | PRN
  Administered 2024-06-08: 50 mg via ORAL
  Filled 2024-06-08: qty 2

## 2024-06-08 MED ORDER — KETOROLAC TROMETHAMINE 15 MG/ML IJ SOLN
15.0000 mg | Freq: Four times a day (QID) | INTRAMUSCULAR | Status: AC
  Administered 2024-06-09 – 2024-06-12 (×10): 15 mg via INTRAVENOUS
  Filled 2024-06-08 (×10): qty 1

## 2024-06-08 MED ORDER — PANTOPRAZOLE SODIUM 40 MG PO TBEC
40.0000 mg | DELAYED_RELEASE_TABLET | Freq: Every day | ORAL | Status: DC
  Administered 2024-06-08 – 2024-06-13 (×6): 40 mg via ORAL
  Filled 2024-06-08 (×6): qty 1

## 2024-06-08 MED ORDER — HYDROMORPHONE HCL 1 MG/ML IJ SOLN
2.0000 mg | INTRAMUSCULAR | Status: AC
  Administered 2024-06-08: 2 mg via INTRAVENOUS
  Filled 2024-06-08: qty 2

## 2024-06-08 MED ORDER — HYDROMORPHONE HCL 1 MG/ML IJ SOLN
2.0000 mg | Freq: Once | INTRAMUSCULAR | Status: AC
  Administered 2024-06-08: 2 mg via INTRAVENOUS
  Filled 2024-06-08: qty 2

## 2024-06-08 MED ORDER — NALOXONE HCL 0.4 MG/ML IJ SOLN: 0.4000 mg | INTRAMUSCULAR | Status: DC | PRN

## 2024-06-08 MED ORDER — ONDANSETRON HCL 4 MG/2ML IJ SOLN
4.0000 mg | INTRAMUSCULAR | Status: DC | PRN
  Administered 2024-06-10 – 2024-06-14 (×14): 4 mg via INTRAVENOUS
  Filled 2024-06-08 (×15): qty 2

## 2024-06-08 MED ORDER — ONDANSETRON HCL 4 MG/2ML IJ SOLN
4.0000 mg | INTRAMUSCULAR | Status: DC | PRN
  Administered 2024-06-08: 4 mg via INTRAVENOUS
  Filled 2024-06-08: qty 2

## 2024-06-08 MED ORDER — SENNOSIDES-DOCUSATE SODIUM 8.6-50 MG PO TABS
1.0000 | ORAL_TABLET | Freq: Two times a day (BID) | ORAL | Status: DC
  Administered 2024-06-08 – 2024-06-15 (×14): 1 via ORAL
  Filled 2024-06-08 (×14): qty 1

## 2024-06-08 MED ORDER — ONDANSETRON HCL 4 MG PO TABS: 4.0000 mg | ORAL_TABLET | ORAL | Status: DC | PRN

## 2024-06-08 MED ORDER — KETOROLAC TROMETHAMINE 15 MG/ML IJ SOLN
15.0000 mg | Freq: Once | INTRAMUSCULAR | Status: AC
  Administered 2024-06-08: 15 mg via INTRAVENOUS
  Filled 2024-06-08: qty 1

## 2024-06-08 MED ORDER — ALUM & MAG HYDROXIDE-SIMETH 200-200-20 MG/5ML PO SUSP
30.0000 mL | Freq: Once | ORAL | Status: AC
  Administered 2024-06-08: 30 mL via ORAL
  Filled 2024-06-08: qty 30

## 2024-06-08 NOTE — ED Triage Notes (Signed)
 Pt presents with sickle cell crisis, endorses pain all over. Recently admitted for the same.

## 2024-06-08 NOTE — ED Provider Notes (Signed)
 " [place] [facility] Provider Note   CSN: [id number] Arrival date & time: 06/08/24  1134     Patient presents with: No chief complaint on file.   Bailey Reese is a 19 y.o. female p with past medical history of sickle cell disease presents to emergency room with complaint of abdominal pain.  She reports this started 4 days ago and is associated with nausea and vomiting.  Primarily locates to epigastric area.  She reports this is not typical of her sickle cell pain crisis. Does also admit concern for sickle cell pain crisis she says she has not been tolerating her oral medicines at home due to her nausea and vomiting.  She denies any fever, cough chest pain or shortness of breath.   HPI     Prior to Admission medications  Medication Sig Start Date End Date Taking? Authorizing Provider  amoxicillin -clavulanate (AUGMENTIN ) 875-125 MG tablet Take 1 tablet by mouth every 12 (twelve) hours. 06/02/24   Jegede, Olugbemiga E, MD  budesonide-formoterol  (SYMBICORT) 80-4.5 MCG/ACT inhaler Inhale 2 puffs into the lungs in the morning and at bedtime. 12/29/22 05/27/24  [provider]  Calcium Carb-Cholecalciferol (CALCIUM 600+D3 PO) Take 2 tablets by mouth daily.    [provider]  ergocalciferol  (VITAMIN D2) 1.25 MG (50000 UT) capsule Take 1 capsule (50,000 Units total) by mouth once a week. Patient taking differently: Take 50,000 Units by mouth every Wednesday. 02/12/24   Oley Bascom RAMAN, NP  esomeprazole  (NEXIUM ) 40 MG capsule Take 1 capsule (40 mg total) by mouth daily as needed (reflux). 08/29/23   Ijaola, Onyeje M, NP  fexofenadine (ALLEGRA) 180 MG tablet Take 180 mg by mouth in the morning. 12/29/22   [provider]  guaiFENesin -dextromethorphan  (ROBITUSSIN DM) 100-10 MG/5ML syrup Take 5 mLs by mouth every 4 (four) hours as needed for cough. 06/02/24   Jegede, Olugbemiga E, MD  hydroxyurea  (HYDREA ) 500 MG capsule Take 1,500 mg by  mouth daily. May take with food to minimize GI side effects.    [provider]  hydrOXYzine  (ATARAX ) 25 MG tablet Take 25 mg by mouth See admin instructions. Take 25 mg by mouth twice a day and an additional 25 mg once a day as needed for anxiety    [provider]  lubiprostone  (AMITIZA ) 24 MCG capsule Take 24 mcg by mouth in the morning and at bedtime. 11/12/23   [provider]  ondansetron  (ZOFRAN ) 4 MG tablet Take 4 mg by mouth See admin instructions. Take 4 mg by mouth up to 4 times a day 12/29/23   [provider]  Oxycodone  HCl 10 MG TABS Take 1 tablet (10 mg total) by mouth every 6 (six) hours as needed (pain). 05/23/24   Oley Bascom RAMAN, NP  pantoprazole  (PROTONIX ) 40 MG tablet Take 1 tablet (40 mg total) by mouth 2 (two) times daily. Patient taking differently: Take 40 mg by mouth daily as needed (reflux). 07/05/23 07/04/24  Sim Emery CROME, MD  prazosin  (MINIPRESS ) 1 MG capsule Take 1 mg by mouth at bedtime.    [provider]  pregabalin  (LYRICA ) 150 MG capsule Take 1 capsule (150 mg total) by mouth 3 (three) times daily. 05/01/24   Oley Bascom RAMAN, NP  scopolamine  (TRANSDERM-SCOP) 1 MG/3DAYS Place 1 patch onto the skin every 3 (three) days. 01/03/24   [provider]    Allergies: Cherry, Droperidol , Olanzapine , Other, Peanut-containing drug products, Plum pulp, Morphine  and codeine, Peach [prunus persica],  Acetaminophen , Compazine  [prochlorperazine ], and Ibuprofen     Review of Systems  Gastrointestinal:  Positive for abdominal pain.    Updated Vital Signs BP 128/78   Pulse 72   Temp 97.6 F (36.4 C)   Resp 17   LMP 05/20/2024 (Approximate)   SpO2 98%   Physical Exam Vitals and nursing note reviewed.  Constitutional:      General: She is not in acute distress.    Appearance: She is not toxic-appearing.  HENT:     Head: Normocephalic and atraumatic.  Eyes:     General: No scleral icterus.    Conjunctiva/sclera:  Conjunctivae normal.  Cardiovascular:     Rate and Rhythm: Normal rate and regular rhythm.     Pulses: Normal pulses.     Heart sounds: Normal heart sounds.  Pulmonary:     Effort: Pulmonary effort is normal. No respiratory distress.     Breath sounds: Normal breath sounds.  Abdominal:     General: Abdomen is flat. Bowel sounds are normal.     Palpations: Abdomen is soft.     Tenderness: There is no abdominal tenderness.     Comments: TTP in epigastric area.  Skin:    General: Skin is warm and dry.     Findings: No lesion.  Neurological:     General: No focal deficit present.     Mental Status: She is alert and oriented to person, place, and time. Mental status is at baseline.     (all labs ordered are listed, but only abnormal results are displayed) Labs Reviewed  COMPREHENSIVE METABOLIC PANEL WITH GFR - Abnormal; Notable for the following components:      Result Value   BUN 5 (*)    Total Protein 8.4 (*)    Alkaline Phosphatase 138 (*)    Total Bilirubin 4.0 (*)    All other components within normal limits  CBC WITH DIFFERENTIAL/PLATELET - Abnormal; Notable for the following components:   RBC 2.60 (*)    Hemoglobin 8.0 (*)    HCT 24.4 (*)    RDW 20.5 (*)    Platelets 582 (*)    nRBC 1.8 (*)    Monocytes Absolute 1.4 (*)    Basophils Absolute 0.2 (*)    All other components within normal limits  RETICULOCYTES - Abnormal; Notable for the following components:   Retic Ct Pct 14.5 (*)    RBC. 2.50 (*)    Retic Count, Absolute 362.0 (*)    Immature Retic Fract 22.6 (*)    All other components within normal limits  HCG, SERUM, QUALITATIVE  LIPASE, BLOOD  URINALYSIS, ROUTINE W REFLEX MICROSCOPIC    EKG: EKG Interpretation Date/Time:  Saturday June 08 2024 12:48:19 EST Ventricular Rate:  83 PR Interval:  144 QRS Duration:  84 QT Interval:  356 QTC Calculation: 418 R Axis:   56  Text Interpretation: Normal sinus rhythm T wave abnormality, consider anterior  ischemia Abnormal ECG When compared with ECG of 27-May-2024 03:14, PREVIOUS ECG IS PRESENT when compared to prior, overall similar appearance No sTEMI Confirmed by Ginger Barefoot ([id number]) on 06/08/2024 1:08:34 PM  Radiology: No results found.   Procedures   Medications Ordered in the ED  ondansetron  (ZOFRAN ) injection 4 mg (4 mg Intravenous Given 06/08/24 1500)  diphenhydrAMINE  (BENADRYL ) capsule 25-50 mg (has no administration in time range)  alum & mag hydroxide-simeth (MAALOX/MYLANTA) 200-200-20 MG/5ML suspension 30 mL (has no administration in time range)  HYDROmorphone  (DILAUDID ) injection 2 mg (2 mg Intravenous  Given 06/08/24 1505)                                    Medical Decision Making Amount and/or Complexity of Data Reviewed Labs: ordered. Radiology: ordered.  Risk Prescription drug management.   This patient presents to the ED for concern of SCD pain, this involves an extensive number of treatment options, and is a complaint that carries with it a high risk of complications and morbidity.  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   Lab Tests:  I personally interpreted labs.  The pertinent results include:   CBC shows baseline anemia and hemoglobin 8, CMP shows baseline elevation in alk phos and total bilirubin.  Pregnancy test is negative.  I have added on a lipase and reticulocyte count is pending.   Imaging Studies ordered:  I ordered imaging studies including CT scan of abdomen and pelvis.  This is pending at the time of signout to  oncoming ED provider.    Cardiac Monitoring: / EKG:  The patient was maintained on a cardiac monitor.  I personally viewed and interpreted the cardiac monitored which showed an underlying rhythm of: normal sinus, no acute change from prior EKG   Problem List / ED Course / Critical interventions / Medication management  Patient complains of abdominal pain associate with nausea and vomiting.  She reports that some of her symptoms are consistent with her sickle cell pain crisis but some are not.  Unfortunately unable to tolerate home medications due to her nausea vomiting.  On arrival she is hemodynamically stable she is quite well-appearing.  She does have focal area of abdominal tenderness on exam in epigastric area.  I will add on a lipase.  Also obtaining CT scan of abdomen and pelvis.  Will give Zofran  for nausea and trial GI cocktail if able to tolerate oral intake.  Her lab work so far is reassuring and consistent with baseline I ordered medication including Dilaudid , Zofran , Benadryl . Reevaluation of the patient after these medicines showed that the patient stayed the same I have reviewed the patients home medicines and have made adjustments as needed. Signed out to oncoming ED provider at shift change.         Final diagnoses:  Sickle cell pain crisis [facility])  Epigastric pain    ED Discharge Orders     None          Shermon Warren SAILOR, PA-C 06/08/24 1507    Tegeler, Lonni PARAS, MD 06/08/24 1527  "

## 2024-06-08 NOTE — Assessment & Plan Note (Signed)
 Pain consistent with typical pain crises symptoms but also having abdominal pain that she says is atypical.  Recently admitted and discharged at [facility] long for pain crisis.  Was seen in the ED 2 days ago for same but not admitted.  Labs and CT scan are not concerning for other pathology.  Lipase is pending.  - mIVF - d5 1/2ns - PCA w/ dilaudid  - iv toradal prn - am cbc, cmp - advance diet as tolerated

## 2024-06-08 NOTE — ED Provider Triage Note (Signed)
 Emergency Medicine Provider Triage Evaluation Note  Bailey Reese , a 19 y.o. female  was evaluated in triage.  Pt complains of nausea, vomiting, sickle cell pain crisis.  She reports unable to eat, drink, sleeping secondary to pain.  No improvement with home oxycodone .  Does report some mild chest pain but reports this is normal for her pain crisis, no change from baseline..  Review of Systems  Positive: Pain, nausea, vomiting, diarrhea Negative:   Physical Exam  BP 128/78   Pulse 72   Temp 97.6 F (36.4 C)   Resp 17   LMP 05/20/2024 (Approximate)   SpO2 98%  Gen:   Awake, no distress   Resp:  Normal effort  MSK:   Moves extremities without difficulty  Other:    Medical Decision Making  Medically screening exam initiated at 12:06 PM.  Appropriate orders placed.  Bailey Reese was informed that the remainder of the evaluation will be completed by another provider, this initial triage assessment does not replace that evaluation, and the importance of remaining in the ED until their evaluation is complete.  Workup initiated in triage    Bailey Reese, [place] 06/08/24 1207

## 2024-06-08 NOTE — ED Provider Notes (Signed)
 Accepted handoff at shift change from Barrett, PA-C. Please see prior provider note for more detail.   Briefly: Patient is 19 y.o.   DDX: concern for sickle cell pain crisis  Plan: Reassess for pain  control and CTAP and consider d/c vs admission  Physical Exam  BP 106/62   Pulse 61   Temp 98 F (36.7 C)   Resp 13   LMP 05/20/2024 (Approximate)   SpO2 100%   Physical Exam Vitals and nursing note reviewed.  Constitutional:      General: She is not in acute distress.    Appearance: She is not toxic-appearing.  HENT:     Head: Normocephalic and atraumatic.  Eyes:     General: No scleral icterus.    Conjunctiva/sclera: Conjunctivae normal.  Cardiovascular:     Rate and Rhythm: Normal rate and regular rhythm.     Pulses: Normal pulses.     Heart sounds: Normal heart sounds.  Pulmonary:     Effort: Pulmonary effort is normal. No respiratory distress.     Breath sounds: Normal breath sounds.  Abdominal:     General: Abdomen is flat. Bowel sounds are normal.     Palpations: Abdomen is soft.     Tenderness: There is no abdominal tenderness.     Comments: TTP in epigastric area.  Skin:    General: Skin is warm and dry.     Findings: No lesion.  Neurological:     General: No focal deficit present.     Mental Status: She is alert and oriented to person, place, and time. Mental status is at baseline.     Procedures  Procedures  ED Course / MDM    Medical Decision Making Amount and/or Complexity of Data Reviewed Labs: ordered. Radiology: ordered.  Risk OTC drugs. Prescription drug management. Decision regarding hospitalization.   Patient signed out by Barrett, PA-C. In brief, here with concerns of SCC. Home pain regimen consists of oxycodone  10mg  QID, Tylenol , and hydroxyurea  for management. Has been taking meds as prescribed. She is endorsing some generalized abdominal tenderness which is atypical for [facility] for her.  CTAP negative. Shows some constipation likely  secondary to chronic opioid use. Pain is still 8 out of 10 after x3 dilaudid , toradol , and fluids. Will consult for admission.  Spoke with Dr. Chandra, hospitalist, who will be admitting patient.       Allison Silva A, PA-C 06/08/24 2148    Dean Clarity, MD 06/08/24 2237

## 2024-06-08 NOTE — ED Triage Notes (Signed)
 Pt states she has been in pain all over and have been unable to eat, drink or sleep due to the pain. Pt c/o N/Vx4d.

## 2024-06-08 NOTE — Assessment & Plan Note (Signed)
 Close to baseline.  Continue to monitor.

## 2024-06-08 NOTE — H&P (Signed)
 " History and Physical    Bailey Reese FMW:[id number] DOB: [birth year] DOA: 06/08/2024  DOS: the patient was seen and examined on 06/08/2024  PCP: Oley Bascom RAMAN, NP   Patient coming from: Home  I have personally briefly reviewed patient's old medical records in [facility]  No notes on file   ED Course:   19 yo F with medical history significant of sickle cell disease who presents to the ED with worsening pain.  Reports generalized pain, which is most severe in the abdomen. The abdominal pain is described as a twisting sensation in the left upper quadrant, superior to the umbilicus. The pain has been worsening over the past two days. Associated symptoms include vomiting since two days ago, with inability to keep down food or medications. Vomitus has contained clots. Reports fevers up to 100s. Afebrile today in the ED. Has been able to keep some liquids down but also vomits these. Denies constipation. Reports this is a typical presentation for her sickle cell crises.  Past Medical History: - Medical History: Sickle cell disease. Recent admission approximately two weeks ago. ED visit two days ago for similar pain. - Social History: Denies regular tobacco or alcohol  use. Emergency contact is mother or sister. - Allergies: Noted to be up to date. - home Medications: oxycodone  10 mg as needed for pain. Lyrica  for daily pain management, but currently out of this medication.  Review of Systems:  ROS  Past Medical History:  Diagnosis Date   Anxiety    Depression    PTSD (post-traumatic stress disorder)    Sickle cell anemia (HCC)     Past Surgical History:  Procedure Laterality Date   BIOPSY  07/04/2023   Procedure: BIOPSY;  Surgeon: Saintclair Jasper, MD;  Location: [facility];  Service: Gastroenterology;;   CHOLECYSTECTOMY     ESOPHAGOGASTRODUODENOSCOPY (EGD) WITH PROPOFOL  N/A 07/04/2023   Procedure: ESOPHAGOGASTRODUODENOSCOPY (EGD) WITH PROPOFOL ;  Surgeon: Saintclair Jasper, MD;   Location: [facility];  Service: Gastroenterology;  Laterality: N/A;   WRIST SURGERY       reports that she has never smoked. She has never used smokeless tobacco. She reports that she does not drink alcohol  and does not use drugs.  Allergies[1]  Family History  Problem Relation Age of Onset   Diabetes Paternal Aunt     Prior to Admission medications  Medication Sig Start Date End Date Taking? Authorizing Provider  amoxicillin -clavulanate (AUGMENTIN ) 875-125 MG tablet Take 1 tablet by mouth every 12 (twelve) hours. 06/02/24   Jegede, Olugbemiga E, MD  budesonide-formoterol  (SYMBICORT) 80-4.5 MCG/ACT inhaler Inhale 2 puffs into the lungs in the morning and at bedtime. 12/29/22 05/27/24  [provider]  Calcium Carb-Cholecalciferol (CALCIUM 600+D3 PO) Take 2 tablets by mouth daily.    [provider]  ergocalciferol  (VITAMIN D2) 1.25 MG (50000 UT) capsule Take 1 capsule (50,000 Units total) by mouth once a week. Patient taking differently: Take 50,000 Units by mouth every Wednesday. 02/12/24   Oley Bascom RAMAN, NP  esomeprazole  (NEXIUM ) 40 MG capsule Take 1 capsule (40 mg total) by mouth daily as needed (reflux). 08/29/23   Ijaola, Onyeje M, NP  fexofenadine (ALLEGRA) 180 MG tablet Take 180 mg by mouth in the morning. 12/29/22   [provider]  guaiFENesin -dextromethorphan  (ROBITUSSIN DM) 100-10 MG/5ML syrup Take 5 mLs by mouth every 4 (four) hours as needed for cough. 06/02/24   Jegede, Olugbemiga E, MD  hydroxyurea  (HYDREA ) 500 MG capsule Take 1,500 mg by mouth daily. May  take with food to minimize GI side effects.    [provider]  hydrOXYzine  (ATARAX ) 25 MG tablet Take 25 mg by mouth See admin instructions. Take 25 mg by mouth twice a day and an additional 25 mg once a day as needed for anxiety    [provider]  lubiprostone  (AMITIZA ) 24 MCG capsule Take 24 mcg by mouth in the morning and at bedtime. 11/12/23   [provider]   ondansetron  (ZOFRAN ) 4 MG tablet Take 4 mg by mouth See admin instructions. Take 4 mg by mouth up to 4 times a day 12/29/23   [provider]  Oxycodone  HCl 10 MG TABS Take 1 tablet (10 mg total) by mouth every 6 (six) hours as needed (pain). 05/23/24   Oley Bascom RAMAN, NP  pantoprazole  (PROTONIX ) 40 MG tablet Take 1 tablet (40 mg total) by mouth 2 (two) times daily. Patient taking differently: Take 40 mg by mouth daily as needed (reflux). 07/05/23 07/04/24  Sim Emery CROME, MD  prazosin  (MINIPRESS ) 1 MG capsule Take 1 mg by mouth at bedtime.    [provider]  pregabalin  (LYRICA ) 150 MG capsule Take 1 capsule (150 mg total) by mouth 3 (three) times daily. 05/01/24   Oley Bascom RAMAN, NP  scopolamine  (TRANSDERM-SCOP) 1 MG/3DAYS Place 1 patch onto the skin every 3 (three) days. 01/03/24   [provider]    Physical Exam: Vitals:   06/08/24 1142 06/08/24 1900 06/08/24 1930 06/08/24 2003  BP: 128/78 102/64 106/62   Pulse: 72 62 61   Resp: 17 14 13    Temp: 97.6 F (36.4 C)   98 F (36.7 C)  SpO2: 98% 100% 100%     Physical Exam Constitutional:      Appearance: Normal appearance.  HENT:     Head: Normocephalic.     Nose: Nose normal.     Mouth/Throat:     Mouth: Mucous membranes are moist.     Pharynx: Oropharynx is clear.  Eyes:     General: No scleral icterus.    Conjunctiva/sclera: Conjunctivae normal.     Pupils: Pupils are equal, round, and reactive to light.  Cardiovascular:     Rate and Rhythm: Normal rate and regular rhythm.     Pulses: Normal pulses.     Heart sounds: No murmur heard. Pulmonary:     Effort: Pulmonary effort is normal. No respiratory distress.     Breath sounds: Normal breath sounds. No wheezing or rales.  Abdominal:     General: Abdomen is flat. There is no distension.     Palpations: Abdomen is soft.     Tenderness: There is abdominal tenderness. There is no guarding or rebound.     Comments: Minimal TTP  Musculoskeletal:         General: No swelling.     Cervical back: Normal range of motion.     Right lower leg: No edema.     Left lower leg: No edema.  Skin:    General: Skin is warm and dry.     Coloration: Skin is not jaundiced.  Neurological:     General: No focal deficit present.     Mental Status: She is alert and oriented to person, place, and time.  Psychiatric:        Mood and Affect: Mood normal.        Behavior: Behavior normal.      Labs on Admission: I have personally reviewed following labs and imaging studies  CBC: Recent Labs  Lab 06/06/24 0450 06/08/24 1206  WBC 11.7* 9.3  NEUTROABS 7.2 4.3  HGB 8.9* 8.0*  HCT 29.5* 24.4*  MCV 101.7* 93.8  PLT 449* 582*   Basic Metabolic Panel: Recent Labs  Lab 06/06/24 0721 06/08/24 1206  NA 136 137  K 4.0 3.8  CL 104 102  CO2 18* 24  GLUCOSE 78 82  BUN 7 5*  CREATININE 0.46 0.60  CALCIUM 9.3 9.7   GFR: Estimated Creatinine Clearance: 107.8 mL/min (by C-G formula based on SCr of 0.6 mg/dL). Liver Function Tests: Recent Labs  Lab 06/06/24 0721 06/08/24 1206  AST 45* 35  ALT 36 37  ALKPHOS 130* 138*  BILITOT 3.3* 4.0*  PROT 8.0 8.4*  ALBUMIN 4.1 4.6   No results for input(s): LIPASE, AMYLASE in the last 168 hours. No results for input(s): AMMONIA in the last 168 hours. Coagulation Profile: No results for input(s): INR, PROTIME in the last 168 hours. Cardiac Enzymes: No results for input(s): CKTOTAL, CKMB, CKMBINDEX, TROPONINI, TROPONINIHS in the last 168 hours. ProBNP, BNP (last 5 results) Recent Labs    12/01/23 2047  BNP 22.4   HbA1C: No results for input(s): HGBA1C in the last 72 hours. CBG: No results for input(s): GLUCAP in the last 168 hours. Lipid Profile: No results for input(s): CHOL, HDL, LDLCALC, TRIG, CHOLHDL, LDLDIRECT in the last 72 hours. Thyroid Function Tests: No results for input(s): TSH, T4TOTAL, FREET4, T3FREE, THYROIDAB in the last 72  hours. Anemia Panel: Recent Labs    06/06/24 0721 06/08/24 1230  RETICCTPCT 17.5* 14.5*   Urine analysis:    Component Value Date/Time   COLORURINE YELLOW 05/28/2024 1346   APPEARANCEUR CLEAR 05/28/2024 1346   APPEARANCEUR Clear 05/01/2013 1440   LABSPEC 1.010 05/28/2024 1346   LABSPEC 1.016 05/01/2013 1440   PHURINE 5.0 05/28/2024 1346   GLUCOSEU NEGATIVE 05/28/2024 1346   GLUCOSEU Negative 05/01/2013 1440   HGBUR NEGATIVE 05/28/2024 1346   BILIRUBINUR NEGATIVE 05/28/2024 1346   BILIRUBINUR Negative 05/01/2013 1440   KETONESUR NEGATIVE 05/28/2024 1346   PROTEINUR NEGATIVE 05/28/2024 1346   NITRITE NEGATIVE 05/28/2024 1346   LEUKOCYTESUR NEGATIVE 05/28/2024 1346   LEUKOCYTESUR Negative 05/01/2013 1440    Radiological Exams on Admission: I have personally reviewed images CT ABDOMEN PELVIS W CONTRAST Result Date: 06/08/2024 EXAM: CT ABDOMEN AND PELVIS WITH CONTRAST 06/08/2024 04:41:09 PM TECHNIQUE: CT of the abdomen and pelvis was performed with the administration of 75 mL of iohexol  (OMNIPAQUE ) 350 MG/ML injection. Multiplanar reformatted images are provided for review. Automated exposure control, iterative reconstruction, and/or weight-based adjustment of the mA/kV was utilized to reduce the radiation dose to as low as reasonably achievable. COMPARISON: CT angio chest 05/30/2024, CT abdomen and pelvis 10/29/2023. CLINICAL HISTORY: Abdominal pain, acute, nonlocalized. FINDINGS: LOWER CHEST: Improved Bibasilar vague ground-glass airspace opacities that may represent atelectasis. LIVER: The liver is unremarkable. GALLBLADDER AND BILE DUCTS: Status post cholecystectomy. No biliary ductal dilatation. SPLEEN: Nonspecific 0.8 cm and 1.9 cm splenic hyperdensities. The spleen is normal in size. PANCREAS: No acute abnormality. ADRENAL GLANDS: No acute abnormality. KIDNEYS, URETERS AND BLADDER: No stones in the kidneys or ureters. No hydronephrosis. No perinephric or periureteral stranding.  Urinary bladder is unremarkable. GI AND BOWEL: Stomach demonstrates no acute abnormality. No small or large bowel thickening or dilatation. The appendix is not definitely identified with no inflammatory changes in the right lower quadrant to suggest acute appendicitis. Stool throughout the majority of the colon. There is no bowel obstruction. PERITONEUM AND  RETROPERITONEUM: No ascites. No free air. VASCULATURE: Aorta is normal in caliber. LYMPH NODES: No lymphadenopathy. REPRODUCTIVE ORGANS: The uterus is unremarkable. No adnexal mass. BONES AND SOFT TISSUES: T10 vertebral body hemangioma. Left femoral head avascular necrosis. No focal soft tissue abnormality. IMPRESSION: 1. No acute findings in the abdomen or pelvis. 2. Stool throughout the majority of the colon, which can be seen with constipation. 3. Nonspecific 0.8 cm and 1.9 cm splenic hyperdensities, indeterminate; consider nonemergent contrast-enhanced abdominal MRI for further characterization. Electronically signed by: Morgane Naveau MD 06/08/2024 04:50 PM EST RP Workstation: [id number]    EKG: My personal interpretation of EKG shows: NSR    Assessment/Plan Principal Problem:   Sickle cell pain crisis (HCC) Active Problems:   Sickle cell anemia with pain (HCC)    Assessment and Plan: No notes have been filed under this hospital service. Service: Hospitalist  Sickle Cell pain crisis Pain consistent with typical pain crises symptoms but also having abdominal pain that she says is atypical.  Recently admitted and discharged at [facility] long for pain crisis.  Was seen in the ED 2 days ago for same but not admitted.  Labs and CT scan are not concerning for other pathology.  Lipase is pending. Nonspecific splenic hyperdensities seen on CT may represent non-infectious splenic infarct - no additional treatment at this time beyond supportive care w/ fluids and pain control.  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 Continue to monitor.     DVT prophylaxis: Lovenox  Code Status: Full Code Family Communication: grandmother at bedside. Mother, R[place], is emergency contact  Disposition Plan: home  Consults called: none  Admission status: Inpatient, Med-Surg   Toribio MARLA Slain, MD [facility] 06/08/2024, 8:58 PM       [1]  Allergies Allergen Reactions   Cherry Anaphylaxis, Itching and Swelling   Droperidol  Anxiety and Other (See Comments)    Droperidol  is a medication that prevents nausea and vomiting caused by surgical procedures. The brand name of this medication is Inapsine . = Pt states muscle tension, involuntary movements, eye drifting, and SEIZURES   Olanzapine  Other (See Comments)    Drug-induced liver injury   Other Anaphylaxis, Itching, Swelling and Other (See Comments)    Pitted fruits - Throat itches  ALSO- THE PATIENT IS PRONE TO HAVE SEIZURES.   Peanut-Containing Drug Products Anaphylaxis, Itching and Swelling   Plum Pulp Anaphylaxis and Hives   Morphine  And Codeine Hives and Itching   Peach [Prunus Persica] Itching, Swelling and Other (See Comments)   Acetaminophen  Nausea And Vomiting   Compazine  [Prochlorperazine ] Other (See Comments) and Hypertension    Patient stated that this medication causes her HR to increase and well as BP. She also states that it cause hallucinations.   Ibuprofen  Nausea And Vomiting   "

## 2024-06-08 NOTE — ED Notes (Signed)
[facility] called for transport to [facility] 

## 2024-06-09 DIAGNOSIS — D5700 Hb-SS disease with crisis, unspecified: Secondary | ICD-10-CM

## 2024-06-09 LAB — COMPREHENSIVE METABOLIC PANEL WITH GFR
ALT: 28 U/L (ref 0–44)
AST: 29 U/L (ref 15–41)
Albumin: 4 g/dL (ref 3.5–5.0)
Alkaline Phosphatase: 127 U/L — ABNORMAL HIGH (ref 38–126)
Anion gap: 11 (ref 5–15)
BUN: 8 mg/dL (ref 6–20)
CO2: 22 mmol/L (ref 22–32)
Calcium: 9.1 mg/dL (ref 8.9–10.3)
Chloride: 105 mmol/L (ref 98–111)
Creatinine, Ser: 0.64 mg/dL (ref 0.44–1.00)
GFR, Estimated: >60 mL/min
Glucose, Bld: 90 mg/dL (ref 70–99)
Potassium: 4 mmol/L (ref 3.5–5.1)
Sodium: 137 mmol/L (ref 135–145)
Total Bilirubin: 3.6 mg/dL — ABNORMAL HIGH (ref 0.0–1.2)
Total Protein: 7.7 g/dL (ref 6.5–8.1)

## 2024-06-09 LAB — HEMOGLOBIN AND HEMATOCRIT, BLOOD
HCT: 19.5 % — ABNORMAL LOW (ref 36.0–46.0)
Hemoglobin: 6.8 g/dL — CL (ref 12.0–15.0)

## 2024-06-09 LAB — CBC
HCT: 20.1 % — ABNORMAL LOW (ref 36.0–46.0)
Hemoglobin: 6.9 g/dL — CL (ref 12.0–15.0)
MCH: 31.1 pg (ref 26.0–34.0)
MCHC: 34.3 g/dL (ref 30.0–36.0)
MCV: 90.5 fL (ref 80.0–100.0)
Platelets: 486 10*3/uL — ABNORMAL HIGH (ref 150–400)
RBC: 2.22 MIL/uL — ABNORMAL LOW (ref 3.87–5.11)
RDW: 19.7 % — ABNORMAL HIGH (ref 11.5–15.5)
WBC: 11.5 10*3/uL — ABNORMAL HIGH (ref 4.0–10.5)
nRBC: 1.5 % — ABNORMAL HIGH (ref 0.0–0.2)

## 2024-06-09 LAB — PREPARE RBC (CROSSMATCH)

## 2024-06-09 MED ORDER — HYDROMORPHONE HCL 1 MG/ML IJ SOLN: 1.0000 mg | INTRAMUSCULAR | Status: AC | PRN

## 2024-06-09 MED ORDER — HYDROMORPHONE HCL 1 MG/ML IJ SOLN
1.0000 mg | Freq: Once | INTRAMUSCULAR | Status: AC
  Administered 2024-06-09: 1 mg via INTRAVENOUS
  Filled 2024-06-09: qty 1

## 2024-06-09 MED ORDER — SODIUM CHLORIDE 0.9% IV SOLUTION: Freq: Once | INTRAVENOUS | Status: DC

## 2024-06-09 NOTE — Progress Notes (Signed)
 IV team consult for 2nd site.  2 attempts made 1 with u/s, 1 without.SABRAUnsuccessfull. staff RN notified.

## 2024-06-09 NOTE — Plan of Care (Signed)
" °  Problem: Education: Goal: Knowledge of vaso-occlusive preventative measures will improve Outcome: Progressing Goal: Long-term complications will improve Outcome: Progressing   Problem: Self-Care: Goal: Ability to incorporate actions that prevent/reduce pain crisis will improve Outcome: Progressing   Problem: Bowel/Gastric: Goal: Gut motility will be maintained Outcome: Progressing   Problem: Tissue Perfusion: Goal: Complications related to inadequate tissue perfusion will be avoided or minimized Outcome: Progressing   Problem: Respiratory: Goal: Pulmonary complications will be avoided or minimized Outcome: Progressing Goal: Acute Chest Syndrome will be identified early to prevent complications Outcome: Progressing   Problem: Fluid Volume: Goal: Ability to maintain a balanced intake and output will improve Outcome: Progressing   Problem: Health Behavior: Goal: Postive changes in compliance with treatment and prescription regimens will improve Outcome: Progressing   Problem: Education: Goal: Knowledge of General Education information will improve Description: Including pain rating scale, medication(s)/side effects and non-pharmacologic comfort measures Outcome: Progressing   "

## 2024-06-09 NOTE — Progress Notes (Signed)
" ° ° ° °  Patient Name: Bailey Reese           DOB: 23-Nov-2005  MRN: [id number]      Admission Date: 06/08/2024  Attending Provider: Sim Emery CROME, MD  Primary Diagnosis: Sickle cell pain crisis (HCC)   Level of care: Med-Surg   OVERNIGHT EVENT   Notified by RN of limited venous access.  IV team unable to obtain second IV line needed for blood transfusion. Current hemoglobin level 6.8. Currently, there is only one available IV access, which is being utilized for the PCA infusion.   Given limited access, we will temporarily pause PCA Dilaudid  to transfuse blood.  Patient's pain will be treated with a one-time IV Dilaudid  dose, followed by subcutaneous Dilaudid  as needed. Once the transfusion is complete, the PCA can be resumed.  The RN has reviewed this plan with the patient, who is in agreement.   Kerri Asche, DNP, ACNPC- [facility] [place]    "

## 2024-06-09 NOTE — Progress Notes (Signed)
 SICKLE CELL SERVICE PROGRESS NOTE  Bailey Reese FMW:[id number] DOB: 06/02/2005 DOA: 06/08/2024 PCP: Oley Bascom RAMAN, NP  Assessment/Plan: Principal Problem:   Sickle cell pain crisis (HCC) Active Problems:   Sickle cell anemia with pain (HCC)  Sickle cell pain crisis: Patient admitted with sickle cell pain crisis.  Currently on Dilaudid  PCA and Toradol .  No oral pain medications.  Continue pain management and other supportive care. Anemia of chronic disease: Hemoglobin has dropped from 8.0-6.9.  Patient will be transfused 1 unit of packed red blood cells.  Will continue to monitor closely. Leukocytosis: Most likely due to vaso-occlusive crisis.  Continue to monitor white count Thrombocytosis: Due to sickle cell crisis.  Continue to monitor Chronic pain syndrome: Continue chronic home management Obstructive sleep apnea: Offer CPAP generalized anxiety disorder: Continue with antianxiety resume  Code Status: Full code Family Communication: No family at bedside Disposition Plan: Home when ready  [facility]  Pager [phone]. If 7PM-7AM, please contact night-coverage.  06/09/2024, 10:13 AM  LOS: 1 day   Brief narrative: 19 yo F with medical history significant of sickle cell disease who presents to the ED with worsening pain.  Reports generalized pain, which is most severe in the abdomen. The abdominal pain is described as a twisting sensation in the left upper quadrant, superior to the umbilicus. The pain has been worsening over the past two days. Associated symptoms include vomiting since two days ago, with inability to keep down food or medications. Vomitus has contained clots. Reports fevers up to 100s. Afebrile today in the ED. Has been able to keep some liquids down but also vomits these. Denies constipation. Reports this is a typical presentation for her sickle cell crises.  Consultants: None  Procedures: Chest x-ray  Antibiotics: None  HPI/Subjective: Patient's  pain is still at 7 out of 10.  Hemoglobin has dropped overnight no fever or chills.   Objective: Vitals:   06/09/24 0218 06/09/24 0230 06/09/24 0639 06/09/24 0732  BP:  103/69 (!) 102/59   Pulse:  78 71   Resp: 16 14 20 12   Temp:  98.6 F (37 C) 98.3 F (36.8 C)   TempSrc:  Oral Oral   SpO2:  100% 97%   Weight:      Height:       Weight change:   Intake/Output Summary (Last 24 hours) at 06/09/2024 1013 Last data filed at 06/09/2024 0218 Gross per 24 hour  Intake 250 ml  Output 600 ml  Net -350 ml    General: Alert, awake, oriented x3, in no acute distress.  HEENT: Uncertain/AT [place]ERL, EOMI Neck: Trachea midline,  no masses, no thyromegal,y no JVD, no carotid bruit OROPHARYNX:  Moist, No exudate/ erythema/lesions.  Heart: Regular rate and rhythm, without murmurs, rubs, gallops, PMI non-displaced, no heaves or thrills on palpation.  Lungs: Clear to auscultation, no wheezing or rhonchi noted. No increased vocal fremitus resonant to percussion  Abdomen: Soft, nontender, nondistended, positive bowel sounds, no masses no hepatosplenomegaly noted..  Neuro: No focal neurological deficits noted cranial nerves II through XII grossly intact. DTRs 2+ bilaterally upper and lower extremities. Strength 5 out of 5 in bilateral upper and lower extremities. Musculoskeletal: No warm swelling or erythema around joints, no spinal tenderness noted. Psychiatric: Patient alert and oriented x3, good insight and cognition, good recent to remote recall. Lymph node survey: No cervical axillary or inguinal lymphadenopathy noted.   Data Reviewed: Basic Metabolic Panel: Recent Labs  Lab 06/06/24 0721 06/08/24 1206 06/09/24 9573  NA 136 137 137  K 4.0 3.8 4.0  CL 104 102 105  CO2 18* 24 22  GLUCOSE 78 82 90  BUN 7 5* 8  CREATININE 0.46 0.60 0.64  CALCIUM 9.3 9.7 9.1   Liver Function Tests: Recent Labs  Lab 06/06/24 0721 06/08/24 1206 06/09/24 0426  AST 45* 35 29  ALT 36 37 28  ALKPHOS 130* 138*  127*  BILITOT 3.3* 4.0* 3.6*  PROT 8.0 8.4* 7.7  ALBUMIN 4.1 4.6 4.0   No results for input(s): LIPASE, AMYLASE in the last 168 hours. No results for input(s): AMMONIA in the last 168 hours. CBC: Recent Labs  Lab 06/06/24 0450 06/08/24 1206 06/09/24 0426  WBC 11.7* 9.3 11.5*  NEUTROABS 7.2 4.3  --   HGB 8.9* 8.0* 6.9*  HCT 29.5* 24.4* 20.1*  MCV 101.7* 93.8 90.5  PLT 449* 582* 486*   Cardiac Enzymes: No results for input(s): CKTOTAL, CKMB, CKMBINDEX, TROPONINI in the last 168 hours. BNP (last 3 results) Recent Labs    12/01/23 2047  BNP 22.4    ProBNP (last 3 results) No results for input(s): PROBNP in the last 8760 hours.  CBG: No results for input(s): GLUCAP in the last 168 hours.  No results found for this or any previous visit (from the past 240 hours).   Studies: CT ABDOMEN PELVIS W CONTRAST Result Date: 06/08/2024 EXAM: CT ABDOMEN AND PELVIS WITH CONTRAST 06/08/2024 04:41:09 PM TECHNIQUE: CT of the abdomen and pelvis was performed with the administration of 75 mL of iohexol  (OMNIPAQUE ) 350 MG/ML injection. Multiplanar reformatted images are provided for review. Automated exposure control, iterative reconstruction, and/or weight-based adjustment of the mA/kV was utilized to reduce the radiation dose to as low as reasonably achievable. COMPARISON: CT angio chest 05/30/2024, CT abdomen and pelvis 10/29/2023. CLINICAL HISTORY: Abdominal pain, acute, nonlocalized. FINDINGS: LOWER CHEST: Improved Bibasilar vague ground-glass airspace opacities that may represent atelectasis. LIVER: The liver is unremarkable. GALLBLADDER AND BILE DUCTS: Status post cholecystectomy. No biliary ductal dilatation. SPLEEN: Nonspecific 0.8 cm and 1.9 cm splenic hyperdensities. The spleen is normal in size. PANCREAS: No acute abnormality. ADRENAL GLANDS: No acute abnormality. KIDNEYS, URETERS AND BLADDER: No stones in the kidneys or ureters. No hydronephrosis. No perinephric or  periureteral stranding. Urinary bladder is unremarkable. GI AND BOWEL: Stomach demonstrates no acute abnormality. No small or large bowel thickening or dilatation. The appendix is not definitely identified with no inflammatory changes in the right lower quadrant to suggest acute appendicitis. Stool throughout the majority of the colon. There is no bowel obstruction. PERITONEUM AND RETROPERITONEUM: No ascites. No free air. VASCULATURE: Aorta is normal in caliber. LYMPH NODES: No lymphadenopathy. REPRODUCTIVE ORGANS: The uterus is unremarkable. No adnexal mass. BONES AND SOFT TISSUES: T10 vertebral body hemangioma. Left femoral head avascular necrosis. No focal soft tissue abnormality. IMPRESSION: 1. No acute findings in the abdomen or pelvis. 2. Stool throughout the majority of the colon, which can be seen with constipation. 3. Nonspecific 0.8 cm and 1.9 cm splenic hyperdensities, indeterminate; consider nonemergent contrast-enhanced abdominal MRI for further characterization. Electronically signed by: Morgane Naveau MD 06/08/2024 04:50 PM EST RP Workstation: [id number]   DG Chest Port 1 View Result Date: 06/06/2024 EXAM: 1 VIEW XRAY OF THE CHEST 06/06/2024 05:20:20 AM COMPARISON: CTA chest 05/30/2024. CLINICAL HISTORY: 19 year old female with sick contacts, Sickle Cell Disease, generalized body pain, cough/cold symptoms FINDINGS: LUNGS AND PLEURA: No focal lung opacity. No pleural effusion. No pneumothorax. HEART AND MEDIASTINUM: No abnormality of the cardiac  and mediastinal silhouettes. BONES AND SOFT TISSUES: cholecystectomy clips. No osseous abnormality. IMPRESSION: 1. No acute cardiopulmonary abnormality. Electronically signed by: Helayne Hurst MD 06/06/2024 05:37 AM EST RP Workstation: [id number]   CT Angio Chest Pulmonary Embolism (PE) W or WO Contrast Result Date: 05/30/2024 CLINICAL DATA:  Hemoptysis EXAM: CT ANGIOGRAPHY CHEST WITH CONTRAST TECHNIQUE: Multidetector CT imaging of the chest was performed  using the standard protocol during bolus administration of intravenous contrast. Multiplanar CT image reconstructions and MIPs were obtained to evaluate the vascular anatomy. RADIATION DOSE REDUCTION: This exam was performed according to the departmental dose-optimization program which includes automated exposure control, adjustment of the mA and/or kV according to patient size and/or use of iterative reconstruction technique. CONTRAST:  75mL OMNIPAQUE  IOHEXOL  350 MG/ML SOLN COMPARISON:  Yesterday FINDINGS: Cardiovascular: Satisfactory opacification of the pulmonary arteries to the segmental level. No evidence of pulmonary embolism. Normal heart size. No pericardial effusion. Mediastinum/Nodes: No enlarged mediastinal, hilar, or axillary lymph nodes. Thyroid gland, trachea, and esophagus demonstrate no significant findings. Lungs/Pleura: Lungs are clear. No pleural effusion or pneumothorax. Upper Abdomen: No acute abnormality. Musculoskeletal: No chest wall abnormality. No acute or significant osseous findings. Review of the MIP images confirms the above findings. IMPRESSION: No definite evidence of pulmonary embolus. No definite abnormality seen in the chest. Electronically Signed   By: Lynwood Landy Raddle M.D.   On: 05/30/2024 10:58   DG CHEST PORT 1 VIEW Result Date: 05/29/2024 CLINICAL DATA:  Hemoptysis. EXAM: PORTABLE CHEST 1 VIEW COMPARISON:  Chest radiograph dated 05/27/2024. FINDINGS: The heart size and mediastinal contours are within normal limits. Both lungs are clear. The visualized skeletal structures are unremarkable. IMPRESSION: No active disease. Electronically Signed   By: Vanetta Chou M.D.   On: 05/29/2024 13:27   DG Chest 2 View Result Date: 05/27/2024 EXAM: 2 VIEW(S) XRAY OF THE CHEST 05/27/2024 03:34:00 AM COMPARISON: 04/16/2024 CLINICAL HISTORY: Chest pain FINDINGS: LUNGS AND PLEURA: Bibasilar linear atelectasis/scarring. No pleural effusion. No pneumothorax. HEART AND MEDIASTINUM: No  acute abnormality of the cardiac and mediastinal silhouettes. BONES AND SOFT TISSUES: Right upper quadrant surgical clips noted. No acute osseous abnormality. IMPRESSION: 1. No acute cardiopulmonary abnormality. Electronically signed by: Pinkie Pebbles MD MD 05/27/2024 03:38 AM EST RP Workstation: [id number]    Scheduled Meds:  sodium chloride    Intravenous Once   enoxaparin  (LOVENOX ) injection  40 mg Subcutaneous Q24H   HYDROmorphone    Intravenous Q4H   ketorolac   15 mg Intravenous Q6H   pantoprazole   40 mg Oral Daily   Or   pantoprazole  (PROTONIX ) IV  40 mg Intravenous Daily   senna-docusate  1 tablet Oral BID   Continuous Infusions:  dextrose  5 % and 0.45 % NaCl 100 mL/hr at 06/09/24 0704    Principal Problem:   Sickle cell pain crisis (HCC) Active Problems:   Sickle cell anemia with pain (HCC)

## 2024-06-09 NOTE — OR Nursing (Signed)
 Patient had a critical lab value, Hbg 6.9 at 5.04 and hospitalist informed and he requested a unit of blood and to increase his iv fluid from 48ml/hr to 189ml/her.Preparations underway to transfuse patient. She is asymptomatic and rest calmly in bed and playing on her phone.

## 2024-06-09 NOTE — Progress Notes (Signed)
" ° ° ° °  Patient Name: Bailey Reese           DOB: 2006-01-25  MRN: [id number]      Admission Date: 06/08/2024  Attending Provider: Chandra Toribio POUR, MD  Primary Diagnosis: Sickle cell pain crisis [facility])   Level of care: Med-Surg   OVERNIGHT EVENT   Patient was admitted with sickle cell crisis.  Hemoglobin 6.9.  No overt bleeding reported by nursing staff. Order placed for 1 unit PRBC.   Shanyn Preisler, DNP, ACNPC- AG [facility]    "

## 2024-06-09 NOTE — Progress Notes (Signed)
 Patient has order for transfusion of one unit PRBC for Hgb < 7.0 and symptomatic; pt is not symptomatic; IV team consulted this AM for 2nd IV site; spoke with IV team RN on 2 occasions; unable to start IV; an attempt will be made during the evening shift; will continue to monitor

## 2024-06-10 LAB — COMPREHENSIVE METABOLIC PANEL WITH GFR
ALT: 20 U/L (ref 0–44)
AST: 29 U/L (ref 15–41)
Albumin: 4 g/dL (ref 3.5–5.0)
Alkaline Phosphatase: 121 U/L (ref 38–126)
Anion gap: 10 (ref 5–15)
BUN: <5 mg/dL — ABNORMAL LOW (ref 6–20)
CO2: 23 mmol/L (ref 22–32)
Calcium: 9.5 mg/dL (ref 8.9–10.3)
Chloride: 105 mmol/L (ref 98–111)
Creatinine, Ser: 0.57 mg/dL (ref 0.44–1.00)
GFR, Estimated: >60 mL/min
Glucose, Bld: 88 mg/dL (ref 70–99)
Potassium: 4.2 mmol/L (ref 3.5–5.1)
Sodium: 137 mmol/L (ref 135–145)
Total Bilirubin: 3.5 mg/dL — ABNORMAL HIGH (ref 0.0–1.2)
Total Protein: 7.5 g/dL (ref 6.5–8.1)

## 2024-06-10 LAB — BPAM RBC
Blood Product Expiration Date: 202602282359
ISSUE DATE / TIME: 202601252227
Unit Type and Rh: 5100

## 2024-06-10 LAB — TYPE AND SCREEN
ABO/RH(D): B POS
Antibody Screen: NEGATIVE
Unit division: 0

## 2024-06-10 LAB — CBC
HCT: 25.4 % — ABNORMAL LOW (ref 36.0–46.0)
Hemoglobin: 8.5 g/dL — ABNORMAL LOW (ref 12.0–15.0)
MCH: 30.6 pg (ref 26.0–34.0)
MCHC: 33.5 g/dL (ref 30.0–36.0)
MCV: 91.4 fL (ref 80.0–100.0)
Platelets: 486 10*3/uL — ABNORMAL HIGH (ref 150–400)
RBC: 2.78 MIL/uL — ABNORMAL LOW (ref 3.87–5.11)
RDW: 18.3 % — ABNORMAL HIGH (ref 11.5–15.5)
WBC: 14.2 10*3/uL — ABNORMAL HIGH (ref 4.0–10.5)
nRBC: 1.3 % — ABNORMAL HIGH (ref 0.0–0.2)

## 2024-06-10 MED ORDER — HYDROMORPHONE 1 MG/ML IV SOLN
INTRAVENOUS | Status: DC
  Administered 2024-06-10: 5.5 mg via INTRAVENOUS
  Administered 2024-06-10: 30 mg via INTRAVENOUS
  Administered 2024-06-10: 11 mg via INTRAVENOUS
  Administered 2024-06-10: 4.5 mg via INTRAVENOUS
  Administered 2024-06-11: 6 mg via INTRAVENOUS
  Administered 2024-06-11 (×2): 30 mg via INTRAVENOUS
  Administered 2024-06-12: 2 mg via INTRAVENOUS
  Administered 2024-06-12: 7 mg via INTRAVENOUS
  Filled 2024-06-10 (×5): qty 30

## 2024-06-10 NOTE — Plan of Care (Signed)
" °  Problem: Health Behavior: Goal: Postive changes in compliance with treatment and prescription regimens will improve Outcome: Progressing   Problem: Education: Goal: Knowledge of General Education information will improve Description: Including pain rating scale, medication(s)/side effects and non-pharmacologic comfort measures Outcome: Progressing   "

## 2024-06-10 NOTE — Plan of Care (Signed)

## 2024-06-10 NOTE — Progress Notes (Signed)
 Patient ID: Bailey Reese, female   DOB: 11-15-2005, 19 y.o.   MRN: [id number] Subjective: Bailey Reese  is a 19 y.o. female with medical history that is significant for sickle cell disease, PTSD, major depression and generalized anxiety disorder, chronic pain syndrome, and anemia of chronic disease who usually gets her medical care at [facility] but in the process of transferring her care to [facility] health, she was seen at [facility] emergency room for major complaints of generalized bodyaches that is typical of her sickle cell pain crisis.   Patient is endorsing pain of 8/10 today. She has no new concerns, She denies subjective fever, headache, shortness of breath, nausea, vomiting, diarrhea.   Objective:  Vital signs in last 24 hours:  Vitals:   06/10/24 0222 06/10/24 0239 06/10/24 0429 06/10/24 0646  BP: 111/68  114/68   Pulse: 67  81   Resp: 12 15 18 12   Temp: 98.2 F (36.8 C)  98.3 F (36.8 C)   TempSrc: Oral  Oral   SpO2: 98%  98% 98%  Weight:      Height:        Intake/Output from previous day:   Intake/Output Summary (Last 24 hours) at 06/10/2024 1222 Last data filed at 06/10/2024 0222 Gross per 24 hour  Intake 1978.36 ml  Output --  Net 1978.36 ml    Physical Exam: General: Alert, awake, oriented x3, in no acute distress.  HEENT: [place]/AT PEERL, EOMI Neck: Trachea midline,  no masses, no thyromegal,y no JVD, no carotid bruit OROPHARYNX:  Moist, No exudate/ erythema/lesions.  Heart: Regular rate and rhythm, without murmurs, rubs, gallops, PMI non-displaced, no heaves or thrills on palpation.  Lungs: Clear to auscultation, no wheezing or rhonchi noted. No increased vocal fremitus resonant to percussion  Abdomen: Soft, nontender, nondistended, positive bowel sounds, no masses no hepatosplenomegaly noted..  Neuro: No focal neurological deficits noted cranial nerves II through XII grossly intact. DTRs 2+ bilaterally upper and lower extremities. Strength 5 out of 5 in  bilateral upper and lower extremities. Musculoskeletal: Generalized body tenderness. Psychiatric: Patient alert and oriented x3, good insight and cognition, good recent to remote recall. Lymph node survey: No cervical axillary or inguinal lymphadenopathy noted.  Lab Results:  Basic Metabolic Panel:    Component Value Date/Time   NA 137 06/10/2024 0354   NA 139 02/09/2024 1433   NA 135 05/01/2013 1440   K 4.2 06/10/2024 0354   K 4.3 05/01/2013 1440   CL 105 06/10/2024 0354   CL 102 05/01/2013 1440   CO2 23 06/10/2024 0354   CO2 27 (H) 05/01/2013 1440   BUN <5 (L) 06/10/2024 0354   BUN 7 02/09/2024 1433   BUN 13 05/01/2013 1440   CREATININE 0.57 06/10/2024 0354   CREATININE 0.40 (L) 05/01/2013 1440   GLUCOSE 88 06/10/2024 0354   GLUCOSE 108 (H) 05/01/2013 1440   CALCIUM 9.5 06/10/2024 0354   CALCIUM 9.8 05/01/2013 1440   CBC:    Component Value Date/Time   WBC 14.2 (H) 06/10/2024 0354   HGB 8.5 (L) 06/10/2024 0354   HGB 8.3 (L) 02/09/2024 1433   HCT 25.4 (L) 06/10/2024 0354   HCT 25.2 (L) 02/09/2024 1433   PLT 486 (H) 06/10/2024 0354   PLT 359 02/09/2024 1433   MCV 91.4 06/10/2024 0354   MCV 98 (H) 02/09/2024 1433   MCV 89 05/01/2013 1440   NEUTROABS 4.3 06/08/2024 1206   NEUTROABS 7.3 (H) 02/09/2024 1433   NEUTROABS 7.9 05/30/2012 1958  LYMPHSABS 2.9 06/08/2024 1206   LYMPHSABS 2.8 02/09/2024 1433   LYMPHSABS 5.0 05/30/2012 1958   MONOABS 1.4 (H) 06/08/2024 1206   MONOABS 1.6 (H) 05/30/2012 1958   EOSABS 0.5 06/08/2024 1206   EOSABS 0.3 02/09/2024 1433   EOSABS 0.6 05/30/2012 1958   BASOSABS 0.2 (H) 06/08/2024 1206   BASOSABS 0.1 02/09/2024 1433   BASOSABS 3 04/28/2013 1736   BASOSABS 0.2 (H) 05/30/2012 1958    No results found for this or any previous visit (from the past 240 hours).   Studies/Results: CT ABDOMEN PELVIS W CONTRAST Result Date: 06/08/2024 EXAM: CT ABDOMEN AND PELVIS WITH CONTRAST 06/08/2024 04:41:09 PM TECHNIQUE: CT of the abdomen and  pelvis was performed with the administration of 75 mL of iohexol  (OMNIPAQUE ) 350 MG/ML injection. Multiplanar reformatted images are provided for review. Automated exposure control, iterative reconstruction, and/or weight-based adjustment of the mA/kV was utilized to reduce the radiation dose to as low as reasonably achievable. COMPARISON: CT angio chest 05/30/2024, CT abdomen and pelvis 10/29/2023. CLINICAL HISTORY: Abdominal pain, acute, nonlocalized. FINDINGS: LOWER CHEST: Improved Bibasilar vague ground-glass airspace opacities that may represent atelectasis. LIVER: The liver is unremarkable. GALLBLADDER AND BILE DUCTS: Status post cholecystectomy. No biliary ductal dilatation. SPLEEN: Nonspecific 0.8 cm and 1.9 cm splenic hyperdensities. The spleen is normal in size. PANCREAS: No acute abnormality. ADRENAL GLANDS: No acute abnormality. KIDNEYS, URETERS AND BLADDER: No stones in the kidneys or ureters. No hydronephrosis. No perinephric or periureteral stranding. Urinary bladder is unremarkable. GI AND BOWEL: Stomach demonstrates no acute abnormality. No small or large bowel thickening or dilatation. The appendix is not definitely identified with no inflammatory changes in the right lower quadrant to suggest acute appendicitis. Stool throughout the majority of the colon. There is no bowel obstruction. PERITONEUM AND RETROPERITONEUM: No ascites. No free air. VASCULATURE: Aorta is normal in caliber. LYMPH NODES: No lymphadenopathy. REPRODUCTIVE ORGANS: The uterus is unremarkable. No adnexal mass. BONES AND SOFT TISSUES: T10 vertebral body hemangioma. Left femoral head avascular necrosis. No focal soft tissue abnormality. IMPRESSION: 1. No acute findings in the abdomen or pelvis. 2. Stool throughout the majority of the colon, which can be seen with constipation. 3. Nonspecific 0.8 cm and 1.9 cm splenic hyperdensities, indeterminate; consider nonemergent contrast-enhanced abdominal MRI for further characterization.  Electronically signed by: Morgane Naveau MD 06/08/2024 04:50 PM EST RP Workstation: [id number]     Medications: Scheduled Meds:  enoxaparin  (LOVENOX ) injection  40 mg Subcutaneous Q24H   HYDROmorphone    Intravenous Q4H   ketorolac   15 mg Intravenous Q6H   pantoprazole   40 mg Oral Daily   Or   pantoprazole  (PROTONIX ) IV  40 mg Intravenous Daily   senna-docusate  1 tablet Oral BID   Continuous Infusions:   PRN Meds:.diphenhydrAMINE , naloxone  **AND** sodium chloride  flush, ondansetron  **OR** ondansetron  (ZOFRAN ) IV, polyethylene glycol  Consultants: None   Procedures: Blood transfusion  Antibiotics: None  Assessment/Plan: Principal Problem:   Sickle cell pain crisis (HCC) Active Problems:   Chronic pain syndrome   Sickle cell anemia with pain (HCC)   Anemia of chronic disease   Hb Sickle Cell Disease with Pain crisis: continue IVF 0.45% Saline @KVO , continue weight based Dilaudid  PCA, IV Toradol  15 mg Q 6 H for a total of 5 days, continue oral home pain medications as ordered. Monitor vitals very closely, Re-evaluate pain scale regularly, 2 L of Oxygen by [place]. Patient encouraged to ambulate on the hallway today.    Anemia of Chronic Disease: Hemoglobin at baseline at 8.5  g/dl, no clinical indication for transfusion.  Will continue to monitor daily CBC. Chronic pain Syndrome: Continue oral home pain medication as prescribed. Generalized anxiety disorder: Clinically stable. Patient denies any suicidal ideations or thoughts.  Will continue with her home medication.  Code Status: Full Code Family Communication: N/A Disposition Plan: Not yet ready for discharge  [name] Cover NP  If 7PM-7AM, please contact night-coverage.  06/10/2024, 12:22 PM  LOS: 2 days

## 2024-06-11 LAB — COMPREHENSIVE METABOLIC PANEL WITH GFR
ALT: 21 U/L (ref 0–44)
AST: 30 U/L (ref 15–41)
Albumin: 3.9 g/dL (ref 3.5–5.0)
Alkaline Phosphatase: 119 U/L (ref 38–126)
Anion gap: 10 (ref 5–15)
BUN: 5 mg/dL — ABNORMAL LOW (ref 6–20)
CO2: 23 mmol/L (ref 22–32)
Calcium: 9.4 mg/dL (ref 8.9–10.3)
Chloride: 103 mmol/L (ref 98–111)
Creatinine, Ser: 0.54 mg/dL (ref 0.44–1.00)
GFR, Estimated: >60 mL/min
Glucose, Bld: 82 mg/dL (ref 70–99)
Potassium: 4 mmol/L (ref 3.5–5.1)
Sodium: 136 mmol/L (ref 135–145)
Total Bilirubin: 3.9 mg/dL — ABNORMAL HIGH (ref 0.0–1.2)
Total Protein: 7.4 g/dL (ref 6.5–8.1)

## 2024-06-11 LAB — CBC
HCT: 24.4 % — ABNORMAL LOW (ref 36.0–46.0)
Hemoglobin: 8.1 g/dL — ABNORMAL LOW (ref 12.0–15.0)
MCH: 30.6 pg (ref 26.0–34.0)
MCHC: 33.2 g/dL (ref 30.0–36.0)
MCV: 92.1 fL (ref 80.0–100.0)
Platelets: 493 10*3/uL — ABNORMAL HIGH (ref 150–400)
RBC: 2.65 MIL/uL — ABNORMAL LOW (ref 3.87–5.11)
RDW: 18.3 % — ABNORMAL HIGH (ref 11.5–15.5)
WBC: 10.7 10*3/uL — ABNORMAL HIGH (ref 4.0–10.5)
nRBC: 1.1 % — ABNORMAL HIGH (ref 0.0–0.2)

## 2024-06-11 NOTE — TOC Initial Note (Signed)
 Transition of Care [facility]) - Initial/Assessment Note    Patient Details  Name: Bailey Reese MRN: [id number] Date of Birth: August 09, 2005  Transition of Care [facility]) CM/SW Contact:    Doneta Glenys DASEN, RN Phone Number: 06/11/2024, 3:36 PM  Clinical Narrative:                 PTA lives in a house with her mother. DME-walker,oxygen nocturnal (agency?) denies HH and SDOH needs. Patient mother will transport at discharge. No IP CM needs identified during visit.        Patient Goals and CMS Choice            Expected Discharge Plan and Services                                              Prior Living Arrangements/Services                       Activities of Daily Living   ADL Screening (condition at time of admission) Independently performs ADLs?: No Does the patient have a NEW difficulty with bathing/dressing/toileting/self-feeding that is expected to last >3 days?: No Does the patient have a NEW difficulty with getting in/out of bed, walking, or climbing stairs that is expected to last >3 days?: Yes (Initiates electronic notice to provider for possible PT consult) Does the patient have a NEW difficulty with communication that is expected to last >3 days?: No Is the patient deaf or have difficulty hearing?: No Does the patient have difficulty seeing, even when wearing glasses/contacts?: No Does the patient have difficulty concentrating, remembering, or making decisions?: No  Permission Sought/Granted                  Emotional Assessment              Admission diagnosis:  Epigastric pain [R10.13] Sickle cell pain crisis (HCC) [D57.00] Patient Active Problem List   Diagnosis Date Noted   Hemoptysis 05/30/2024   Dysuria 05/28/2024   Influenza A 04/16/2024   Acute hypoxic respiratory failure (HCC) 04/16/2024   UTI symptoms 02/20/2024   Seizure-like activity (HCC) 01/01/2024   Anemia of chronic disease 11/21/2023   Sickle cell anemia  with pain (HCC) 08/21/2023   Avascular necrosis of hip, right (HCC) 07/23/2023   GI bleed 07/03/2023   Vasoocclusive sickle cell crisis (HCC) 07/02/2023   Sickle cell anemia with crisis (HCC) 05/28/2023   Chronic pain syndrome 05/28/2023   Leukocytosis 05/28/2023   Thrombocytosis 05/28/2023   Hyperbilirubinemia 05/08/2023   GAD (generalized anxiety disorder) 03/29/2023   Asthma, chronic 12/23/2022   Red blood cell antibody positive 09/24/2022   Obstructive sleep apnea syndrome 02/04/2022   Mild intermittent asthma without complication 02/04/2022   Environmental allergies 02/04/2022   Allergic rhinitis 08/12/2020   Vitamin D  insufficiency 06/20/2018   Family dysfunction    Sickle cell crisis (HCC) 08/23/2017   Sickle cell pain crisis (HCC) 08/23/2017   PCP:  Oley Bascom RAMAN, NP Pharmacy:   [facility] [address] ([address]), [place] - [address] [place] ([place]) [place] [address] Phone: [phone] Fax: [phone]  DARRYLE LONG - [facility] [address] [place] [address] Phone: [phone] Fax: [phone]     Social Drivers of Health (SDOH) Social History: SDOH Screenings   Food Insecurity: Food  Insecurity Present (06/08/2024)  Housing: High Risk (06/08/2024)  Transportation Needs: Unmet Transportation Needs (06/08/2024)  Utilities: Not At Risk (06/08/2024)  Depression (PHQ2-9): High Risk (02/09/2024)  Financial Resource Strain: Low Risk (07/25/2023)   Received from [facility]  Physical Activity: Insufficiently Active (12/10/2021)   Received from [facility]  Social Connections: Socially Isolated (01/15/2024)  Stress: Stress Concern Present (12/10/2021)   Received from [facility]  Tobacco Use: Low Risk (06/08/2024)   SDOH Interventions: Food Insecurity Interventions: Intervention Not Indicated, Inpatient TOC Housing Interventions: Intervention Not Indicated,  Inpatient TOC Transportation Interventions: Intervention Not Indicated, Inpatient TOC Social Connections Interventions: Intervention Not Indicated, Inpatient TOC   Readmission Risk Interventions    06/11/2024    3:33 PM 05/28/2024    4:23 PM 01/16/2024    8:38 AM  Readmission Risk Prevention Plan  Transportation Screening Complete Complete Complete  Medication Review Oceanographer) Complete Complete Complete  PCP or Specialist appointment within 3-5 days of discharge Complete Complete Complete  HRI or Home Care Consult Complete Complete Complete  SW Recovery Care/Counseling Consult Complete Complete Complete  Palliative Care Screening Not Applicable Not Applicable Not Applicable  Skilled Nursing Facility Not Applicable Not Applicable Not Applicable

## 2024-06-11 NOTE — Plan of Care (Signed)

## 2024-06-11 NOTE — Progress Notes (Signed)
 PT Cancellation Note  Patient Details Name: Bailey Reese MRN: [id number] DOB: [birth year]   Cancelled Treatment:    Reason Eval/Treat Not Completed:  Attempted PT eval-pt declined to participate at this time 2* nausea. Will check back as schedule allows.    Dannial SQUIBB, PT Acute Rehabilitation  Office: [phone]

## 2024-06-11 NOTE — Progress Notes (Signed)
 Patient ID: NYJAI GRAFF, female   DOB: 11-Dec-2005, 19 y.o.   MRN: [id number] Subjective: Bailey Reese  is a 19 y.o. female with medical history that is significant for sickle cell disease, PTSD, major depression and generalized anxiety disorder, chronic pain syndrome, and anemia of chronic disease who usually gets her medical care at [facility] but in the process of transferring her care to [facility] health, she was seen at [facility] emergency room for major complaints of generalized bodyaches that is typical of her sickle cell pain crisis.   Patient is endorsing pain of 8/10 today no changes from yesterday, she is reporting new nausea and vomiting.  Encouraged to use Zofran  as ordered.  She denies subjective fever, headache, shortness of breath or diarrhea.   Objective:  Vital signs in last 24 hours:  Vitals:   06/11/24 0812 06/11/24 0826 06/11/24 1147 06/11/24 1214  BP:  108/70 108/72   Pulse:  64 63   Resp: 15 18 18 14   Temp:  98.2 F (36.8 C) 98.5 F (36.9 C)   TempSrc:  Oral Oral   SpO2: 100% 98% 97%   Weight:      Height:        Intake/Output from previous day:   Intake/Output Summary (Last 24 hours) at 06/11/2024 1241 Last data filed at 06/10/2024 2000 Gross per 24 hour  Intake --  Output 500 ml  Net -500 ml    Physical Exam: General: Alert, awake, oriented x3, in no acute distress.  HEENT: [place]/AT PEERL, EOMI Neck: Trachea midline,  no masses, no thyromegal,y no JVD, no carotid bruit OROPHARYNX:  Moist, No exudate/ erythema/lesions.  Heart: Regular rate and rhythm, without murmurs, rubs, gallops, PMI non-displaced, no heaves or thrills on palpation.  Lungs: Clear to auscultation, no wheezing or rhonchi noted. No increased vocal fremitus resonant to percussion  Abdomen: Soft, nontender, nondistended, positive bowel sounds, no masses no hepatosplenomegaly noted..  Neuro: No focal neurological deficits noted cranial nerves II through XII grossly intact. DTRs 2+  bilaterally upper and lower extremities. Strength 5 out of 5 in bilateral upper and lower extremities. Musculoskeletal: Generalized body tenderness. Psychiatric: Patient alert and oriented x3, good insight and cognition, good recent to remote recall. Lymph node survey: No cervical axillary or inguinal lymphadenopathy noted.  Lab Results:  Basic Metabolic Panel:    Component Value Date/Time   NA 136 06/11/2024 0455   NA 139 02/09/2024 1433   NA 135 05/01/2013 1440   K 4.0 06/11/2024 0455   K 4.3 05/01/2013 1440   CL 103 06/11/2024 0455   CL 102 05/01/2013 1440   CO2 23 06/11/2024 0455   CO2 27 (H) 05/01/2013 1440   BUN 5 (L) 06/11/2024 0455   BUN 7 02/09/2024 1433   BUN 13 05/01/2013 1440   CREATININE 0.54 06/11/2024 0455   CREATININE 0.40 (L) 05/01/2013 1440   GLUCOSE 82 06/11/2024 0455   GLUCOSE 108 (H) 05/01/2013 1440   CALCIUM 9.4 06/11/2024 0455   CALCIUM 9.8 05/01/2013 1440   CBC:    Component Value Date/Time   WBC 10.7 (H) 06/11/2024 0455   HGB 8.1 (L) 06/11/2024 0455   HGB 8.3 (L) 02/09/2024 1433   HCT 24.4 (L) 06/11/2024 0455   HCT 25.2 (L) 02/09/2024 1433   PLT 493 (H) 06/11/2024 0455   PLT 359 02/09/2024 1433   MCV 92.1 06/11/2024 0455   MCV 98 (H) 02/09/2024 1433   MCV 89 05/01/2013 1440   NEUTROABS 4.3 06/08/2024 1206  NEUTROABS 7.3 (H) 02/09/2024 1433   NEUTROABS 7.9 05/30/2012 1958   LYMPHSABS 2.9 06/08/2024 1206   LYMPHSABS 2.8 02/09/2024 1433   LYMPHSABS 5.0 05/30/2012 1958   MONOABS 1.4 (H) 06/08/2024 1206   MONOABS 1.6 (H) 05/30/2012 1958   EOSABS 0.5 06/08/2024 1206   EOSABS 0.3 02/09/2024 1433   EOSABS 0.6 05/30/2012 1958   BASOSABS 0.2 (H) 06/08/2024 1206   BASOSABS 0.1 02/09/2024 1433   BASOSABS 3 04/28/2013 1736   BASOSABS 0.2 (H) 05/30/2012 1958    No results found for this or any previous visit (from the past 240 hours).   Studies/Results: No results found.    Medications: Scheduled Meds:  enoxaparin  (LOVENOX ) injection   40 mg Subcutaneous Q24H   HYDROmorphone    Intravenous Q4H   ketorolac   15 mg Intravenous Q6H   pantoprazole   40 mg Oral Daily   Or   pantoprazole  (PROTONIX ) IV  40 mg Intravenous Daily   senna-docusate  1 tablet Oral BID   Continuous Infusions:   PRN Meds:.diphenhydrAMINE , naloxone  **AND** sodium chloride  flush, ondansetron  **OR** ondansetron  (ZOFRAN ) IV, polyethylene glycol  Consultants: None   Procedures:  none  Antibiotics: None  Assessment/Plan: Principal Problem:   Sickle cell pain crisis (HCC) Active Problems:   Chronic pain syndrome   Sickle cell anemia with pain (HCC)   Anemia of chronic disease   Hb Sickle Cell Disease with Pain crisis: continue IVF 0.45% Saline @KVO , continue weight based Dilaudid  PCA, IV Toradol  15 mg Q 6 H for a total of 5 days, continue oral home pain medications as ordered. Monitor vitals very closely, Re-evaluate pain scale regularly, 2 L of Oxygen by [place]. Patient encouraged to ambulate on the hallway today.    Anemia of Chronic Disease: Hemoglobin at baseline at 8.1 g/dl, no clinical indication for transfusion.  Will continue to monitor daily CBC. Chronic pain Syndrome: Continue oral home pain medication as prescribed. Generalized anxiety disorder: Clinically stable. Patient denies any suicidal ideations or thoughts.  Will continue with her home medication.  Code Status: Full Code Family Communication: N/A Disposition Plan: Not yet ready for discharge  [name] Cover NP  If 7PM-7AM, please contact night-coverage.  06/11/2024, 12:41 PM  LOS: 3 days

## 2024-06-11 NOTE — Plan of Care (Signed)
  Problem: Education: Goal: Knowledge of vaso-occlusive preventative measures will improve Outcome: Progressing Goal: Awareness of infection prevention will improve Outcome: Progressing Goal: Awareness of signs and symptoms of anemia will improve Outcome: Progressing

## 2024-06-12 LAB — COMPREHENSIVE METABOLIC PANEL WITH GFR
ALT: 26 U/L (ref 0–44)
AST: 37 U/L (ref 15–41)
Albumin: 3.9 g/dL (ref 3.5–5.0)
Alkaline Phosphatase: 120 U/L (ref 38–126)
Anion gap: 10 (ref 5–15)
BUN: 7 mg/dL (ref 6–20)
CO2: 23 mmol/L (ref 22–32)
Calcium: 9.4 mg/dL (ref 8.9–10.3)
Chloride: 103 mmol/L (ref 98–111)
Creatinine, Ser: 0.61 mg/dL (ref 0.44–1.00)
GFR, Estimated: >60 mL/min
Glucose, Bld: 84 mg/dL (ref 70–99)
Potassium: 4.2 mmol/L (ref 3.5–5.1)
Sodium: 137 mmol/L (ref 135–145)
Total Bilirubin: 4 mg/dL — ABNORMAL HIGH (ref 0.0–1.2)
Total Protein: 7.6 g/dL (ref 6.5–8.1)

## 2024-06-12 LAB — CBC
HCT: 25.2 % — ABNORMAL LOW (ref 36.0–46.0)
Hemoglobin: 8.3 g/dL — ABNORMAL LOW (ref 12.0–15.0)
MCH: 30.5 pg (ref 26.0–34.0)
MCHC: 32.9 g/dL (ref 30.0–36.0)
MCV: 92.6 fL (ref 80.0–100.0)
Platelets: 490 10*3/uL — ABNORMAL HIGH (ref 150–400)
RBC: 2.72 MIL/uL — ABNORMAL LOW (ref 3.87–5.11)
RDW: 18.6 % — ABNORMAL HIGH (ref 11.5–15.5)
WBC: 11 10*3/uL — ABNORMAL HIGH (ref 4.0–10.5)
nRBC: 0.8 % — ABNORMAL HIGH (ref 0.0–0.2)

## 2024-06-12 MED ORDER — HYDROXYZINE HCL 25 MG PO TABS
25.0000 mg | ORAL_TABLET | Freq: Once | ORAL | Status: AC
  Administered 2024-06-12: 25 mg via ORAL
  Filled 2024-06-12: qty 1

## 2024-06-12 MED FILL — Hydromorphone HCl Inj 2 MG/ML: INTRAMUSCULAR | Qty: 1 | Status: AC

## 2024-06-12 NOTE — Progress Notes (Signed)
 PT Note  Patient Details Name: Bailey Reese MRN: [id number] DOB: Jun 24, 2005   Cancelled Treatment:    Reason Eval/Treat Not Completed: PT screened, no needs identified, will sign off. Pt reports pain and nausea limiting mobility. Pt is able to transfer mod I bed <> BSC and nursing staff endorses. If need for therapy is warranted at a later date, please re-order. Thank you for this referral.   [place], PT Acute Rehab   [place] VEAR Drone 06/12/2024, 3:01 PM

## 2024-06-12 NOTE — Plan of Care (Signed)

## 2024-06-12 NOTE — Progress Notes (Cosign Needed)
 Patient ID: Bailey Reese, female   DOB: 07/05/2005, 19 y.o.   MRN: [id number] Subjective: Bailey Reese  is a 19 y.o. female with medical history that is significant for sickle cell disease, PTSD, major depression and generalized anxiety disorder, chronic pain syndrome, and anemia of chronic disease who usually gets her medical care at [facility] but in the process of transferring her care to [facility] health, she was seen at [facility] emergency room for major complaints of generalized bodyaches that is typical of her sickle cell pain crisis.   Patient is endorsing unchanged pain of 8/10 today no changes from yesterday, she is reporting new nausea and vomiting.  Encouraged to use Zofran  as ordered.  She denies subjective fever, headache, shortness of breath or diarrhea.   Objective:  Vital signs in last 24 hours:  Vitals:   06/12/24 0738 06/12/24 0809 06/12/24 1151 06/12/24 1204  BP: 118/69  106/66   Pulse: 66  72   Resp: 15 13 17 13   Temp:   98.7 F (37.1 C)   TempSrc:   Oral   SpO2: 97%  98%   Weight:      Height:        Intake/Output from previous day:  No intake or output data in the 24 hours ending 06/12/24 1242   Physical Exam: General: Alert, awake, oriented x3, in no acute distress.  HEENT: [place]/AT PEERL, EOMI Neck: Trachea midline,  no masses, no thyromegal,y no JVD, no carotid bruit OROPHARYNX:  Moist, No exudate/ erythema/lesions.  Heart: Regular rate and rhythm, without murmurs, rubs, gallops, PMI non-displaced, no heaves or thrills on palpation.  Lungs: Clear to auscultation, no wheezing or rhonchi noted. No increased vocal fremitus resonant to percussion  Abdomen: Soft, nontender, nondistended, positive bowel sounds, no masses no hepatosplenomegaly noted..  Neuro: No focal neurological deficits noted cranial nerves II through XII grossly intact. DTRs 2+ bilaterally upper and lower extremities. Strength 5 out of 5 in bilateral upper and lower  extremities. Musculoskeletal: Generalized body tenderness. Psychiatric: Patient alert and oriented x3, good insight and cognition, good recent to remote recall. Lymph node survey: No cervical axillary or inguinal lymphadenopathy noted.  Lab Results:  Basic Metabolic Panel:    Component Value Date/Time   NA 137 06/12/2024 0446   NA 139 02/09/2024 1433   NA 135 05/01/2013 1440   K 4.2 06/12/2024 0446   K 4.3 05/01/2013 1440   CL 103 06/12/2024 0446   CL 102 05/01/2013 1440   CO2 23 06/12/2024 0446   CO2 27 (H) 05/01/2013 1440   BUN 7 06/12/2024 0446   BUN 7 02/09/2024 1433   BUN 13 05/01/2013 1440   CREATININE 0.61 06/12/2024 0446   CREATININE 0.40 (L) 05/01/2013 1440   GLUCOSE 84 06/12/2024 0446   GLUCOSE 108 (H) 05/01/2013 1440   CALCIUM 9.4 06/12/2024 0446   CALCIUM 9.8 05/01/2013 1440   CBC:    Component Value Date/Time   WBC 11.0 (H) 06/12/2024 0446   HGB 8.3 (L) 06/12/2024 0446   HGB 8.3 (L) 02/09/2024 1433   HCT 25.2 (L) 06/12/2024 0446   HCT 25.2 (L) 02/09/2024 1433   PLT 490 (H) 06/12/2024 0446   PLT 359 02/09/2024 1433   MCV 92.6 06/12/2024 0446   MCV 98 (H) 02/09/2024 1433   MCV 89 05/01/2013 1440   NEUTROABS 4.3 06/08/2024 1206   NEUTROABS 7.3 (H) 02/09/2024 1433   NEUTROABS 7.9 05/30/2012 1958   LYMPHSABS 2.9 06/08/2024 1206   LYMPHSABS  2.8 02/09/2024 1433   LYMPHSABS 5.0 05/30/2012 1958   MONOABS 1.4 (H) 06/08/2024 1206   MONOABS 1.6 (H) 05/30/2012 1958   EOSABS 0.5 06/08/2024 1206   EOSABS 0.3 02/09/2024 1433   EOSABS 0.6 05/30/2012 1958   BASOSABS 0.2 (H) 06/08/2024 1206   BASOSABS 0.1 02/09/2024 1433   BASOSABS 3 04/28/2013 1736   BASOSABS 0.2 (H) 05/30/2012 1958    No results found for this or any previous visit (from the past 240 hours).   Studies/Results: No results found.    Medications: Scheduled Meds:  enoxaparin  (LOVENOX ) injection  40 mg Subcutaneous Q24H   HYDROmorphone    Intravenous Q4H   pantoprazole   40 mg Oral Daily    Or   pantoprazole  (PROTONIX ) IV  40 mg Intravenous Daily   senna-docusate  1 tablet Oral BID   Continuous Infusions:   PRN Meds:.diphenhydrAMINE , naloxone  **AND** sodium chloride  flush, ondansetron  **OR** ondansetron  (ZOFRAN ) IV, polyethylene glycol  Consultants: None   Procedures:  none  Antibiotics: None  Assessment/Plan: Principal Problem:   Sickle cell pain crisis (HCC) Active Problems:   Chronic pain syndrome   Sickle cell anemia with pain (HCC)   Anemia of chronic disease   Hb Sickle Cell Disease with Pain crisis: continue IVF 0.45% Saline @KVO , continue weight based Dilaudid  PCA, IV Toradol  15 mg Q 6 H for a total of 5 days, continue oral home pain medications as ordered. Monitor vitals very closely, Re-evaluate pain scale regularly, 2 L of Oxygen by . Patient encouraged to ambulate on the hallway today.    Anemia of Chronic Disease: Hemoglobin at baseline at 8. 3 g/dl, no clinical indication for transfusion.  Will continue to monitor daily CBC. Chronic pain Syndrome: Continue oral home pain medication as prescribed. Generalized anxiety disorder: Clinically stable. Patient denies any suicidal ideations or thoughts.  Will continue with her home medication.  Code Status: Full Code Family Communication: N/A Disposition Plan: Not yet ready for discharge  [name] Cover NP  If 7PM-7AM, please contact night-coverage.  06/12/2024, 12:42 PM  LOS: 4 days

## 2024-06-12 NOTE — Plan of Care (Signed)
  Problem: Education: Goal: Knowledge of vaso-occlusive preventative measures will improve Outcome: Progressing Goal: Awareness of infection prevention will improve Outcome: Progressing Goal: Awareness of signs and symptoms of anemia will improve Outcome: Progressing

## 2024-06-13 LAB — CBC
HCT: 24.7 % — ABNORMAL LOW (ref 36.0–46.0)
Hemoglobin: 8.2 g/dL — ABNORMAL LOW (ref 12.0–15.0)
MCH: 30.3 pg (ref 26.0–34.0)
MCHC: 33.2 g/dL (ref 30.0–36.0)
MCV: 91.1 fL (ref 80.0–100.0)
Platelets: 359 10*3/uL (ref 150–400)
RBC: 2.71 MIL/uL — ABNORMAL LOW (ref 3.87–5.11)
RDW: 18.1 % — ABNORMAL HIGH (ref 11.5–15.5)
WBC: 12.6 10*3/uL — ABNORMAL HIGH (ref 4.0–10.5)
nRBC: 0.4 % — ABNORMAL HIGH (ref 0.0–0.2)

## 2024-06-13 LAB — COMPREHENSIVE METABOLIC PANEL WITH GFR
ALT: 36 U/L (ref 0–44)
AST: 50 U/L — ABNORMAL HIGH (ref 15–41)
Albumin: 4 g/dL (ref 3.5–5.0)
Alkaline Phosphatase: 121 U/L (ref 38–126)
Anion gap: 13 (ref 5–15)
BUN: 10 mg/dL (ref 6–20)
CO2: 20 mmol/L — ABNORMAL LOW (ref 22–32)
Calcium: 9.4 mg/dL (ref 8.9–10.3)
Chloride: 103 mmol/L (ref 98–111)
Creatinine, Ser: 0.56 mg/dL (ref 0.44–1.00)
GFR, Estimated: >60 mL/min
Glucose, Bld: 96 mg/dL (ref 70–99)
Potassium: 4.4 mmol/L (ref 3.5–5.1)
Sodium: 136 mmol/L (ref 135–145)
Total Bilirubin: 3.4 mg/dL — ABNORMAL HIGH (ref 0.0–1.2)
Total Protein: 7.7 g/dL (ref 6.5–8.1)

## 2024-06-13 MED ORDER — PREGABALIN 50 MG PO CAPS
150.0000 mg | ORAL_CAPSULE | Freq: Three times a day (TID) | ORAL | Status: DC
  Administered 2024-06-13 – 2024-06-15 (×5): 150 mg via ORAL
  Filled 2024-06-13 (×5): qty 3

## 2024-06-13 MED ORDER — DIPHENHYDRAMINE HCL 25 MG PO CAPS
25.0000 mg | ORAL_CAPSULE | Freq: Once | ORAL | Status: AC
  Administered 2024-06-13: 25 mg via ORAL
  Filled 2024-06-13: qty 1

## 2024-06-13 MED ORDER — OXYCODONE HCL 5 MG PO TABS
10.0000 mg | ORAL_TABLET | Freq: Four times a day (QID) | ORAL | Status: DC | PRN
  Administered 2024-06-13 – 2024-06-15 (×8): 10 mg via ORAL
  Filled 2024-06-13 (×8): qty 2

## 2024-06-13 MED ORDER — HYDROXYUREA 500 MG PO CAPS
1500.0000 mg | ORAL_CAPSULE | Freq: Every day | ORAL | Status: DC
  Administered 2024-06-14 – 2024-06-15 (×2): 1500 mg via ORAL
  Filled 2024-06-13 (×2): qty 3

## 2024-06-13 MED ORDER — PANTOPRAZOLE SODIUM 40 MG PO TBEC
40.0000 mg | DELAYED_RELEASE_TABLET | Freq: Two times a day (BID) | ORAL | Status: DC
  Administered 2024-06-13 – 2024-06-15 (×4): 40 mg via ORAL
  Filled 2024-06-13 (×4): qty 1

## 2024-06-13 MED ORDER — SODIUM CHLORIDE 0.9% FLUSH
10.0000 mL | Freq: Two times a day (BID) | INTRAVENOUS | Status: DC
  Administered 2024-06-13 – 2024-06-15 (×4): 10 mL

## 2024-06-13 MED ORDER — HYDROMORPHONE HCL 1 MG/ML IJ SOLN
1.0000 mg | Freq: Once | INTRAMUSCULAR | Status: AC
  Administered 2024-06-13: 1 mg via INTRAVENOUS
  Filled 2024-06-13: qty 1

## 2024-06-13 MED ORDER — SODIUM CHLORIDE 0.9% FLUSH: 10.0000 mL | INTRAVENOUS | Status: DC | PRN

## 2024-06-13 MED ORDER — ONDANSETRON HCL 4 MG PO TABS
4.0000 mg | ORAL_TABLET | Freq: Once | ORAL | Status: AC
  Administered 2024-06-13: 4 mg via ORAL
  Filled 2024-06-13: qty 1

## 2024-06-13 MED ORDER — HYDROMORPHONE HCL 1 MG/ML IJ SOLN
2.0000 mg | Freq: Once | INTRAMUSCULAR | Status: AC
  Administered 2024-06-13: 2 mg via INTRAVENOUS
  Filled 2024-06-13 (×2): qty 2

## 2024-06-13 NOTE — Plan of Care (Signed)

## 2024-06-13 NOTE — Progress Notes (Signed)
 Patient ID: Bailey Reese, female   DOB: 03-Jun-2005, 19 y.o.   MRN: [id number] Subjective: Bailey Reese  is a 19 y.o. female with medical history that is significant for sickle cell disease, PTSD, major depression and generalized anxiety disorder, chronic pain syndrome, and anemia of chronic disease who usually gets her medical care at [facility] but in the process of transferring her care to [facility] health, she was seen at [facility] emergency room for major complaints of generalized bodyaches that is typical of her sickle cell pain crisis.   Patient is endorsing pain of 7/10 today slight improvement from yesterday, She denies subjective fever, headache, shortness of breath or diarrhea.   Objective:  Vital signs in last 24 hours:  Vitals:   06/13/24 0618 06/13/24 0724 06/13/24 0807 06/13/24 1001  BP: 101/61 108/61  (!) 115/53  Pulse: 72 73  74  Resp: 18 12 14 18   Temp: 98.5 F (36.9 C)   (!) 97.4 F (36.3 C)  TempSrc: Oral   Oral  SpO2: 100% 98%  100%  Weight:      Height:        Intake/Output from previous day:  No intake or output data in the 24 hours ending 06/13/24 1258   Physical Exam: General: Alert, awake, oriented x3, in no acute distress.  HEENT: [place]/AT PEERL, EOMI Neck: Trachea midline,  no masses, no thyromegal,y no JVD, no carotid bruit OROPHARYNX:  Moist, No exudate/ erythema/lesions.  Heart: Regular rate and rhythm, without murmurs, rubs, gallops, PMI non-displaced, no heaves or thrills on palpation.  Lungs: Clear to auscultation, no wheezing or rhonchi noted. No increased vocal fremitus resonant to percussion  Abdomen: Soft, nontender, nondistended, positive bowel sounds, no masses no hepatosplenomegaly noted..  Neuro: No focal neurological deficits noted cranial nerves II through XII grossly intact. DTRs 2+ bilaterally upper and lower extremities. Strength 5 out of 5 in bilateral upper and lower extremities. Musculoskeletal: Generalized body  tenderness. Psychiatric: Patient alert and oriented x3, good insight and cognition, good recent to remote recall. Lymph node survey: No cervical axillary or inguinal lymphadenopathy noted.  Lab Results:  Basic Metabolic Panel:    Component Value Date/Time   NA 136 06/13/2024 0433   NA 139 02/09/2024 1433   NA 135 05/01/2013 1440   K 4.4 06/13/2024 0433   K 4.3 05/01/2013 1440   CL 103 06/13/2024 0433   CL 102 05/01/2013 1440   CO2 20 (L) 06/13/2024 0433   CO2 27 (H) 05/01/2013 1440   BUN 10 06/13/2024 0433   BUN 7 02/09/2024 1433   BUN 13 05/01/2013 1440   CREATININE 0.56 06/13/2024 0433   CREATININE 0.40 (L) 05/01/2013 1440   GLUCOSE 96 06/13/2024 0433   GLUCOSE 108 (H) 05/01/2013 1440   CALCIUM 9.4 06/13/2024 0433   CALCIUM 9.8 05/01/2013 1440   CBC:    Component Value Date/Time   WBC 12.6 (H) 06/13/2024 0433   HGB 8.2 (L) 06/13/2024 0433   HGB 8.3 (L) 02/09/2024 1433   HCT 24.7 (L) 06/13/2024 0433   HCT 25.2 (L) 02/09/2024 1433   PLT 359 06/13/2024 0433   PLT 359 02/09/2024 1433   MCV 91.1 06/13/2024 0433   MCV 98 (H) 02/09/2024 1433   MCV 89 05/01/2013 1440   NEUTROABS 4.3 06/08/2024 1206   NEUTROABS 7.3 (H) 02/09/2024 1433   NEUTROABS 7.9 05/30/2012 1958   LYMPHSABS 2.9 06/08/2024 1206   LYMPHSABS 2.8 02/09/2024 1433   LYMPHSABS 5.0 05/30/2012 1958  MONOABS 1.4 (H) 06/08/2024 1206   MONOABS 1.6 (H) 05/30/2012 1958   EOSABS 0.5 06/08/2024 1206   EOSABS 0.3 02/09/2024 1433   EOSABS 0.6 05/30/2012 1958   BASOSABS 0.2 (H) 06/08/2024 1206   BASOSABS 0.1 02/09/2024 1433   BASOSABS 3 04/28/2013 1736   BASOSABS 0.2 (H) 05/30/2012 1958    No results found for this or any previous visit (from the past 240 hours).   Studies/Results: No results found.    Medications: Scheduled Meds:  enoxaparin  (LOVENOX ) injection  40 mg Subcutaneous Q24H   pantoprazole   40 mg Oral Daily   Or   pantoprazole  (PROTONIX ) IV  40 mg Intravenous Daily   senna-docusate  1  tablet Oral BID   Continuous Infusions:   PRN Meds:.ondansetron  **OR** ondansetron  (ZOFRAN ) IV, polyethylene glycol  Consultants: None   Procedures:  none  Antibiotics: None  Assessment/Plan: Principal Problem:   Sickle cell pain crisis (HCC) Active Problems:   Chronic pain syndrome   Sickle cell anemia with pain (HCC)   Anemia of chronic disease   Hb Sickle Cell Disease with Pain crisis: continue IVF 0.45% Saline @KVO , discontinued weight based Dilaudid  PCA, to help wean patient off and prepare for discharge.  IV Toradol  15 mg Q 6 H for a total of 5 days, continue oral home pain medications as ordered. Monitor vitals very closely, Re-evaluate pain scale regularly, 2 L of Oxygen by [place]. Patient encouraged to ambulate on the hallway today.    Anemia of Chronic Disease: Hemoglobin at baseline at 8. 2 g/dl, no clinical indication for transfusion.  Will continue to monitor daily CBC. Chronic pain Syndrome: Continue oral home pain medication as prescribed. Generalized anxiety disorder: Clinically stable. Patient denies any suicidal ideations or thoughts.  Will continue with her home medication.  Code Status: Full Code Family Communication: N/A Disposition Plan: Not yet ready for discharge  [name] Cover NP  If 7PM-7AM, please contact night-coverage.  06/13/2024, 12:58 PM  LOS: 5 days

## 2024-06-13 NOTE — Plan of Care (Signed)
  Problem: Education: Goal: Awareness of infection prevention will improve Outcome: Progressing Goal: Awareness of signs and symptoms of anemia will improve Outcome: Progressing Goal: Long-term complications will improve Outcome: Progressing   Problem: Self-Care: Goal: Ability to incorporate actions that prevent/reduce pain crisis will improve Outcome: Progressing

## 2024-06-13 NOTE — Progress Notes (Signed)

## 2024-06-13 NOTE — Plan of Care (Incomplete)
" °  Problem: Education: Goal: Knowledge of vaso-occlusive preventative measures will improve Outcome: Progressing   Problem: Education: Goal: Awareness of signs and symptoms of anemia will improve Outcome: Progressing   "

## 2024-06-14 ENCOUNTER — Inpatient Hospital Stay (HOSPITAL_COMMUNITY)

## 2024-06-14 LAB — GLUCOSE, CAPILLARY: Glucose-Capillary: 80 mg/dL (ref 70–99)

## 2024-06-14 NOTE — Progress Notes (Signed)
 Rapid Response called by Jori, RN for patient that hit her head and fell. Jori told rapid RN to meet patient in CT 2. Arrived and waited for patient. Rapid RN colleague Pecolia Rex, RN present and is assigned nurse for patient tonight. No new deficits reported by Marche'. Will report to oncoming shift to monitor.

## 2024-06-14 NOTE — Progress Notes (Signed)
 Patient ID: Bailey Reese, female   DOB: 07-01-2005, 19 y.o.   MRN: [id number] Subjective: Bailey Reese  is a 19 y.o. female with medical history that is significant for sickle cell disease, PTSD, major depression and generalized anxiety disorder, chronic pain syndrome, and anemia of chronic disease who usually gets her medical care at [facility] but in the process of transferring her care to [facility] health, she was seen at [facility] emergency room for major complaints of generalized bodyaches that is typical of her sickle cell pain crisis.   Patient is endorsing pain of 7/10 today slight improvement from yesterday, She denies subjective fever, headache, shortness of breath or diarrhea.   Objective:  Vital signs in last 24 hours:  Vitals:   06/14/24 0745 06/14/24 0855 06/14/24 1001 06/14/24 1111  BP: 122/68 116/68 116/71 118/69  Pulse: 72 74 77 71  Resp:  18 18 17   Temp: 98.5 F (36.9 C) 98.5 F (36.9 C) 98.9 F (37.2 C) 98.5 F (36.9 C)  TempSrc: Oral Oral Oral   SpO2: 100% 100% 100% 100%  Weight:      Height:        Intake/Output from previous day:   Intake/Output Summary (Last 24 hours) at 06/14/2024 1236 Last data filed at 06/13/2024 2120 Gross per 24 hour  Intake 186.1 ml  Output --  Net 186.1 ml     Physical Exam: General: Alert, awake, oriented x3, in no acute distress.  HEENT: [place]/AT PEERL, EOMI Neck: Trachea midline,  no masses, no thyromegal,y no JVD, no carotid bruit OROPHARYNX:  Moist, No exudate/ erythema/lesions.  Heart: Regular rate and rhythm, without murmurs, rubs, gallops, PMI non-displaced, no heaves or thrills on palpation.  Lungs: Clear to auscultation, no wheezing or rhonchi noted. No increased vocal fremitus resonant to percussion  Abdomen: Soft, nontender, nondistended, positive bowel sounds, no masses no hepatosplenomegaly noted..  Neuro: No focal neurological deficits noted cranial nerves II through XII grossly intact. DTRs 2+ bilaterally upper  and lower extremities. Strength 5 out of 5 in bilateral upper and lower extremities. Musculoskeletal: Generalized body tenderness. Psychiatric: Patient alert and oriented x3, good insight and cognition, good recent to remote recall. Lymph node survey: No cervical axillary or inguinal lymphadenopathy noted.  Lab Results:  Basic Metabolic Panel:    Component Value Date/Time   NA 136 06/13/2024 0433   NA 139 02/09/2024 1433   NA 135 05/01/2013 1440   K 4.4 06/13/2024 0433   K 4.3 05/01/2013 1440   CL 103 06/13/2024 0433   CL 102 05/01/2013 1440   CO2 20 (L) 06/13/2024 0433   CO2 27 (H) 05/01/2013 1440   BUN 10 06/13/2024 0433   BUN 7 02/09/2024 1433   BUN 13 05/01/2013 1440   CREATININE 0.56 06/13/2024 0433   CREATININE 0.40 (L) 05/01/2013 1440   GLUCOSE 96 06/13/2024 0433   GLUCOSE 108 (H) 05/01/2013 1440   CALCIUM 9.4 06/13/2024 0433   CALCIUM 9.8 05/01/2013 1440   CBC:    Component Value Date/Time   WBC 12.6 (H) 06/13/2024 0433   HGB 8.2 (L) 06/13/2024 0433   HGB 8.3 (L) 02/09/2024 1433   HCT 24.7 (L) 06/13/2024 0433   HCT 25.2 (L) 02/09/2024 1433   PLT 359 06/13/2024 0433   PLT 359 02/09/2024 1433   MCV 91.1 06/13/2024 0433   MCV 98 (H) 02/09/2024 1433   MCV 89 05/01/2013 1440   NEUTROABS 4.3 06/08/2024 1206   NEUTROABS 7.3 (H) 02/09/2024 1433  NEUTROABS 7.9 05/30/2012 1958   LYMPHSABS 2.9 06/08/2024 1206   LYMPHSABS 2.8 02/09/2024 1433   LYMPHSABS 5.0 05/30/2012 1958   MONOABS 1.4 (H) 06/08/2024 1206   MONOABS 1.6 (H) 05/30/2012 1958   EOSABS 0.5 06/08/2024 1206   EOSABS 0.3 02/09/2024 1433   EOSABS 0.6 05/30/2012 1958   BASOSABS 0.2 (H) 06/08/2024 1206   BASOSABS 0.1 02/09/2024 1433   BASOSABS 3 04/28/2013 1736   BASOSABS 0.2 (H) 05/30/2012 1958    No results found for this or any previous visit (from the past 240 hours).   Studies/Results: DG HIP UNILAT WITH PELVIS 2-3 VIEWS RIGHT Result Date: 06/14/2024 EXAM: 2-3 VIEW(S) XRAY OF THE PELVIS AND  RIGHT HIP 06/14/2024 06:56:00 AM COMPARISON: None available. CLINICAL HISTORY: Pain; Fall. FINDINGS: BONES AND JOINTS: SI joints are symmetric. No acute fracture. Bilateral hips demonstrate normal alignment. Ill-defined sclerosis of the left femoral head with subchondral lucency and sclerosis, compatible with avascular necrosis. SOFT TISSUES: Unremarkable. IMPRESSION: 1. Ill-defined sclerosis of the left femoral head with subchondral lucency and sclerosis, compatible with avascular necrosis. 2. Normal appearance of the right hip. Electronically signed by: Waddell Calk MD 06/14/2024 08:36 AM EST RP Workstation: [id number]   CT HEAD WO CONTRAST ( ) Result Date: 06/14/2024 EXAM: CT HEAD WITHOUT CONTRAST 06/14/2024 06:38:00 AM TECHNIQUE: CT of the head was performed without the administration of intravenous contrast. Automated exposure control, iterative reconstruction, and/or weight based adjustment of the mA/kV was utilized to reduce the radiation dose to as low as reasonably achievable. COMPARISON: 03/01/2024 CLINICAL HISTORY: Head trauma, minor, normal mental status (age 60-64 years). FINDINGS: BRAIN AND VENTRICLES: Stable Chiari I malformation. No acute hemorrhage. No evidence of acute infarct. No hydrocephalus. No extra-axial collection. No mass effect or midline shift. ORBITS: No acute abnormality. SINUSES: No acute abnormality. SOFT TISSUES AND SKULL: No acute soft tissue abnormality. No skull fracture. IMPRESSION: 1. No acute intracranial abnormality. 2. Stable Chiari I malformation. Electronically signed by: Evalene Coho MD 06/14/2024 06:53 AM EST RP Workstation: [id number]      Medications: Scheduled Meds:  enoxaparin  (LOVENOX ) injection  40 mg Subcutaneous Q24H   hydroxyurea   1,500 mg Oral Daily   pantoprazole   40 mg Oral BID   pregabalin   150 mg Oral TID   senna-docusate  1 tablet Oral BID   sodium chloride  flush  10-40 mL Intracatheter Q12H   Continuous Infusions:   PRN  Meds:.ondansetron  **OR** ondansetron  (ZOFRAN ) IV, oxyCODONE , polyethylene glycol, sodium chloride  flush  Consultants: None   Procedures:  none  Antibiotics: None  Assessment/Plan: Principal Problem:   Sickle cell pain crisis (HCC) Active Problems:   Chronic pain syndrome   Sickle cell anemia with pain (HCC)   Anemia of chronic disease   Hb Sickle Cell Disease with Pain crisis: pain is gradually improving, continue IVF 0.45% Saline @KVO , discontinued weight based Dilaudid  PCA to help wean patient off and prepare for discharge.  IV Toradol  15 mg Q 6 H for a total of 5 days (completed) , continue oral home pain medications as ordered. Monitor vitals very closely, Re-evaluate pain scale regularly, 2 L of Oxygen by [place]. Patient encouraged to ambulate on the hallway today.    Anemia of Chronic Disease: Hemoglobin at baseline at 8. 2 g/dl, from yesterday,  no clinical indication for transfusion.  Will continue to monitor daily CBC. AM labs pending. Chronic pain Syndrome: Continue oral home pain medication as prescribed. Generalized anxiety disorder: Clinically stable. Patient denies any suicidal ideations or thoughts.  Will continue  with her home medication.  Code Status: Full Code Family Communication: N/A Disposition Plan: Ready for discharge in the AM  [name] Cover NP  If 7PM-7AM, please contact night-coverage.  06/14/2024, 12:36 PM  LOS: 6 days

## 2024-06-14 NOTE — Progress Notes (Signed)
" ° ° ° °  Patient Name: Bailey Reese           DOB: 01-16-2006  MRN: [id number]      Admission Date: 06/08/2024  Attending Provider: Jegede, Olugbemiga E, MD  Primary Diagnosis: Sickle cell pain crisis (HCC)   Level of care: Med-Surg   OVERNIGHT EVENT   Notified of patient having unwitnessed fall.  The patient reports falling to the ground due to significant abdominal pain, she reports hitting her head (right side) and right hip. Shortly after fall, patient complained of headache and nausea which was treated with Zofran .   Patient is A/O x 4.  Vital signs stable.  No obvious external injury is noted.   No focal neurodeficits noted. Pupils are equal and reactive.  There is no obvious trauma to the hip, however patient is complaining of pain.  No change in range of motion.  Will obtain images on head and hip given hospital follow-up.   Plan: Head CT X-ray hip/pelvis   Lavanda Horns, DNP, ACNPC- AG [facility]    "

## 2024-06-14 NOTE — Plan of Care (Signed)

## 2024-06-14 NOTE — Progress Notes (Signed)
 This RN called on phone to come into the patient room while in another patients room. Upon entering room, this RN witnesses patient bent over positioned on the R hip In the bathroom on the floor holding onto her head. NT states that the bathroom cord was pulled and she went in to check on the patient and found her on the floor. Patient states that she was getting up from the toilet and had a sharp pain in her LUQ. The patient bent over fast while grabbing her LUQ and hit her head on something, then fell.   We paged the NP, obtained a full set of vitals, unable to get temp in mouth because patient was very nauseated and dry heaving. Vital signs obtained, CBG was 80. Patient Alert and oriented, following commands, and pupils equal and reactive to light. Patient does not have any vision changes, no blurred vision, or any new confusion. Patient is uncertain what exactly she hit her head on. Patient seemed to be moving all extremities while rocking, hunched over holding LUQ of abdomen. Patient then was assisted up by this RN and other staff and patient walked over to the bed with help and is no complaining of feeling tired, NP aware. Previous to fall patient has been getting up independently to walk and toilet with non slip socks on, since there were no issues with gait.   Stat head CT obtained, Xray of R hip done. After CT completed, NP okay with giving patient pain medicine, and also gave zofran . Fall huddle completed with Rns at bedside, fall mats placed on floor, bed alarm set, and patient reassessed as high fall risk. Rapid RN called, to make aware of changes in patient status.   06/14/24 0616  Vitals  Temp (!) 97.4 F (36.3 C)  Temp Source Axillary  BP (!) 149/80  MAP (mmHg) 96  BP Method Automatic  Pulse Rate 93  Pulse Rate Source Monitor  Oxygen Therapy  SpO2 100 %  MEWS Score  MEWS Temp 0  MEWS Systolic 0  MEWS Pulse 0  MEWS RR 0  MEWS LOC 0  MEWS Score 0  MEWS Score Color Green      06/14/24 9378  What Happened  Was fall witnessed? No  Was patient injured? Yes  Patient found on floor;in bathroom  Found by Staff-comment [facility] NT)  Stated prior activity bathroom-unassisted  Provider Notification  Provider Name/Title A. Andrez NP  Date Provider Notified 06/14/24  Time Provider Notified [phone]  Method of Notification Page;Face-to-face  Notification Reason Fall  Provider response At bedside;See new orders  Date of Provider Response 06/14/24  Time of Provider Response 0618  Follow Up  Family notified No - patient refusal  Adult Fall Risk Assessment  Risk Factor Category (scoring not indicated) Fall has occurred during this admission (document High fall risk)  Age 19  Fall History: Fall within 6 months prior to admission 0  Elimination; Bowel and/or Urine Incontinence 0  Elimination; Bowel and/or Urine Urgency/Frequency 0  Medications: includes PCA/Opiates, Anti-convulsants, Anti-hypertensives, Diuretics, Hypnotics, Laxatives, Sedatives, and Psychotropics 5  Patient Care Equipment 1  Mobility-Assistance 2  Mobility-Gait 2  Mobility-Sensory Deficit 0  Altered awareness of immediate physical environment 0  Impulsiveness 0  Lack of understanding of one's physical/cognitive limitations 0  Total Score 10  Patient Fall Risk Level High fall risk  Adult Fall Risk Interventions  Required Bundle Interventions *See Row Information* High fall risk  Additional Interventions Use of appropriate toileting equipment (bedpan, BSC,  etc.)  Fall intervention(s) refused/Patient educated regarding refusal Nonskid socks  Screening for Fall Injury Risk (To be completed on HIGH fall risk patients) - Assessing Need for Floor Mats  Risk For Fall Injury- Criteria for Floor Mats Previous fall this admission  Will Implement Floor Mats Yes  Pain Assessment  Pain Scale 0-10  Pain Score 10  Pain Type Acute pain  Pain Location Abdomen  Pain Orientation Left;Upper  Pain Descriptors /  Indicators Sharp;Shooting  Pain Frequency Constant  Pain Intervention(s) Repositioned;MD notified (Comment) (Taking patient to CT scan)  Neurological  Neuro (WDL) WDL  Level of Consciousness Alert  Glasgow Coma Scale  Eye Opening 4  Best Verbal Response (NON-intubated) 5  Best Motor Response 6  Musculoskeletal  Musculoskeletal (WDL) X  Assistive Device BSC  Generalized Weakness Yes  Weight Bearing Restrictions Per Provider Order No  Integumentary  Integumentary (WDL) WDL  Pain Assessment  Date Pain First Started 06/14/24  Result of Injury Yes  Pain Assessment  Work-Related Injury No

## 2024-06-15 DIAGNOSIS — R1013 Epigastric pain: Secondary | ICD-10-CM

## 2024-06-15 DIAGNOSIS — D5700 Hb-SS disease with crisis, unspecified: Secondary | ICD-10-CM | POA: Diagnosis not present

## 2024-06-15 LAB — CBC
HCT: 24.2 % — ABNORMAL LOW (ref 36.0–46.0)
Hemoglobin: 8.2 g/dL — ABNORMAL LOW (ref 12.0–15.0)
MCH: 30.4 pg (ref 26.0–34.0)
MCHC: 33.9 g/dL (ref 30.0–36.0)
MCV: 89.6 fL (ref 80.0–100.0)
Platelets: 458 10*3/uL — ABNORMAL HIGH (ref 150–400)
RBC: 2.7 MIL/uL — ABNORMAL LOW (ref 3.87–5.11)
RDW: 17.7 % — ABNORMAL HIGH (ref 11.5–15.5)
WBC: 13.6 10*3/uL — ABNORMAL HIGH (ref 4.0–10.5)
nRBC: 0.4 % — ABNORMAL HIGH (ref 0.0–0.2)

## 2024-06-15 MED ORDER — PREGABALIN 150 MG PO CAPS: 150.0000 mg | ORAL_CAPSULE | Freq: Three times a day (TID) | ORAL | 0 refills | Status: AC

## 2024-06-15 NOTE — Discharge Summary (Signed)
 Physician Discharge Summary  Bailey Reese FMW:[id number] DOB: 2006/04/16 DOA: 06/08/2024  PCP: Oley Bascom RAMAN, NP  Admit date: 06/08/2024  Discharge date: 06/15/2024  Discharge Diagnoses:  Principal Problem:   Sickle cell pain crisis (HCC) Active Problems:   Chronic pain syndrome   Sickle cell anemia with pain (HCC)   Anemia of chronic disease   Discharge Condition: Stable  Disposition:  Pt is discharged home in good condition and is to follow up with Oley Bascom RAMAN, NP this week to have labs evaluated. Bailey Reese is instructed to increase activity slowly and balance with rest for the next few days, and use prescribed medication to complete treatment of pain  Diet: Regular Wt Readings from Last 3 Encounters:  06/08/24 67 kg  06/06/24 70.3 kg  05/27/24 70.3 kg    History of present illness:  19 yo F with medical history significant of sickle cell disease who presents to the ED with worsening pain.   Reports generalized pain, which is most severe in the abdomen. The abdominal pain is described as a twisting sensation in the left upper quadrant, superior to the umbilicus. The pain has been worsening over the past two days. Associated symptoms include vomiting since two days ago, with inability to keep down food or medications. Vomitus has contained clots. Reports fevers up to 100s. Afebrile today in the ED. Has been able to keep some liquids down but also vomits these. Denies constipation. Reports this is a typical presentation for her sickle cell crises.   Past Medical History: - Medical History: Sickle cell disease. Recent admission approximately two weeks ago. ED visit two days ago for similar pain. - Social History: Denies regular tobacco or alcohol  use. Emergency contact is mother or sister. - Allergies: Noted to be up to date. - home Medications: oxycodone  10 mg as needed for pain. Lyrica  for daily pain management, but currently out of this  medication.  Hospital Course:  Patient was admitted for sickle cell pain crisis and managed appropriately with IVF, IV Dilaudid  via PCA and IV Toradol , as well as other adjunct therapies per sickle cell pain management protocols.  Patient had a slightly prolonged hospital stay of about 7 days on admission, her pain was slow to respond to treatment.  Fortunately, she was successfully weaned off her Dilaudid  via PCA, and transitioned to her oral medications.  As such today, patient is eating and drinking well with no significant restrictions and ambulating well with no significant pain.  She has remained hemodynamically stable throughout his admission.  There was a report of an unwitnessed fall during this admission, when examined by the provider on-call, patient was found to be alert and oriented x 4 with normal vital signs, no external injury noted.  There was no focal neurodeficits.  No obvious trauma to hip.  Imaging studies were obtained with no significant finding.  CT head showed no acute intracranial abnormality, stable Chiari I malformation.  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  Patient has no new complaint today.  Her pain has returned to baseline.  No fever or headache.  No blurry vision.  No chest pain or shortness of breath.  No nausea, vomiting or diarrhea.  No urinary symptoms. Patient was therefore discharged home today in a hemodynamically stable condition.   Bailey Reese will follow-up with PCP within 1 week of this discharge. Bailey Reese was counseled extensively about  nonpharmacologic means of pain management, patient verbalized understanding and was appreciative of  the care received during this admission.   We discussed the need for good hydration, monitoring of hydration status, avoidance of heat, cold, stress, and infection triggers. We discussed the need to be adherent with taking Hydrea  and  other home medications. Patient was reminded of the need to seek medical attention immediately if any symptom of bleeding, anemia, or infection occurs.  Discharge Exam: Vitals:   06/15/24 0131 06/15/24 1318  BP: 117/78 116/64  Pulse: 82 81  Resp: 16 17  Temp: 98.2 F (36.8 C) 98.1 F (36.7 C)  SpO2: 100% 100%   Vitals:   06/14/24 2133 06/14/24 2340 06/15/24 0131 06/15/24 1318  BP: (!) 107/57 131/67 117/78 116/64  Pulse: 68 79 82 81  Resp: 16 15 16 17   Temp: 97.9 F (36.6 C) (!) 97.5 F (36.4 C) 98.2 F (36.8 C) 98.1 F (36.7 C)  TempSrc: Oral Oral Oral Oral  SpO2: 98% 99% 100% 100%  Weight:      Height:        General appearance : Awake, alert, not in any distress. Speech Clear. Not toxic looking HEENT: Atraumatic and Normocephalic, pupils equally reactive to light and accomodation Neck: Supple, no JVD. No cervical lymphadenopathy.  Chest: Good air entry bilaterally, no added sounds  CVS: S1 S2 regular, no murmurs.  Abdomen: Bowel sounds present, Non tender and not distended with no gaurding, rigidity or rebound. Extremities: B/L Lower Ext shows no edema, both legs are warm to touch Neurology: Awake alert, and oriented X 3, CN II-XII intact, Non focal Skin: No Rash  Discharge Instructions  Discharge Instructions     Increase activity slowly   Complete by: As directed       Allergies as of 06/15/2024       Reactions   Cherry Anaphylaxis, Itching, Swelling   Droperidol  Anxiety, Other (See Comments)   Droperidol  is a medication that prevents nausea and vomiting caused by surgical procedures. The brand name of this medication is Inapsine . = Pt states muscle tension, involuntary movements, eye drifting, and SEIZURES   Olanzapine  Other (See Comments)   Drug-induced liver injury   Other Anaphylaxis, Itching, Swelling, Other (See Comments)   Pitted fruits - Throat itches ALSO- THE PATIENT IS PRONE TO HAVE SEIZURES.   Peanut-containing Drug Products  Anaphylaxis, Itching, Swelling   Plum Pulp Anaphylaxis, Hives   Morphine  And Codeine Hives, Itching   Peach [prunus Persica] Itching, Swelling, Other (See Comments)   Acetaminophen  Nausea And Vomiting   Compazine  [prochlorperazine ] Other (See Comments), Hypertension   Patient stated that this medication causes her HR to increase and well as BP. She also states that it cause hallucinations.   Ibuprofen  Nausea And Vomiting        Medication List     TAKE these medications    amoxicillin -clavulanate 875-125 MG tablet Commonly known as: AUGMENTIN  Take 1 tablet by mouth every 12 (twelve) hours.   budesonide-formoterol  80-4.5 MCG/ACT inhaler Commonly known as: SYMBICORT Inhale 2 puffs into the lungs See admin instructions. Take 2 puffs 2 times daily and can take an additional 2 puffs up to 2 times if needed for wheezing or shortness of breath   CALCIUM 600+D3 PO Take 2 tablets by mouth daily.   ergocalciferol  1.25 MG (50000 UT) capsule Commonly known as: VITAMIN D2 Take 1 capsule (50,000 Units total) by mouth once a week.   esomeprazole  40 MG capsule Commonly known as: NEXIUM  Take  1 capsule (40 mg total) by mouth daily as needed (reflux).   fexofenadine 180 MG tablet Commonly known as: ALLEGRA Take 180 mg by mouth in the morning.   guaiFENesin -dextromethorphan  100-10 MG/5ML syrup Commonly known as: ROBITUSSIN DM Take 5 mLs by mouth every 4 (four) hours as needed for cough.   hydroxyurea  500 MG capsule Commonly known as: HYDREA  Take 1,500 mg by mouth daily. May take with food to minimize GI side effects.   hydrOXYzine  25 MG tablet Commonly known as: ATARAX  Take 25 mg by mouth See admin instructions. Take 25 mg by mouth twice a day and an additional 25 mg once a day as needed for anxiety   lubiprostone  24 MCG capsule Commonly known as: AMITIZA  Take 24 mcg by mouth 2 (two) times daily as needed for constipation.   ondansetron  4 MG tablet Commonly known as: ZOFRAN  Take  4 mg by mouth See admin instructions. Take 4 mg by mouth up to 4 times a day   Oxycodone  HCl 10 MG Tabs Take 1 tablet (10 mg total) by mouth every 6 (six) hours as needed (pain).   pantoprazole  40 MG tablet Commonly known as: Protonix  Take 1 tablet (40 mg total) by mouth 2 (two) times daily. What changed:  when to take this reasons to take this   prazosin  1 MG capsule Commonly known as: MINIPRESS  Take 1 mg by mouth at bedtime.   pregabalin  150 MG capsule Commonly known as: LYRICA  Take 1 capsule (150 mg total) by mouth 3 (three) times daily.   scopolamine  1 MG/3DAYS Commonly known as: TRANSDERM-SCOP Place 1 patch onto the skin every 3 (three) days.        The results of significant diagnostics from this hospitalization (including imaging, microbiology, ancillary and laboratory) are listed below for reference.    Significant Diagnostic Studies: DG HIP UNILAT WITH PELVIS 2-3 VIEWS RIGHT Result Date: 06/14/2024 EXAM: 2-3 VIEW(S) XRAY OF THE PELVIS AND RIGHT HIP 06/14/2024 06:56:00 AM COMPARISON: None available. CLINICAL HISTORY: Pain; Fall. FINDINGS: BONES AND JOINTS: SI joints are symmetric. No acute fracture. Bilateral hips demonstrate normal alignment. Ill-defined sclerosis of the left femoral head with subchondral lucency and sclerosis, compatible with avascular necrosis. SOFT TISSUES: Unremarkable. IMPRESSION: 1. Ill-defined sclerosis of the left femoral head with subchondral lucency and sclerosis, compatible with avascular necrosis. 2. Normal appearance of the right hip. Electronically signed by: Waddell Calk MD 06/14/2024 08:36 AM EST RP Workstation: [id number]   CT HEAD WO CONTRAST ( ) Result Date: 06/14/2024 EXAM: CT HEAD WITHOUT CONTRAST 06/14/2024 06:38:00 AM TECHNIQUE: CT of the head was performed without the administration of intravenous contrast. Automated exposure control, iterative reconstruction, and/or weight based adjustment of the mA/kV was utilized to reduce the  radiation dose to as low as reasonably achievable. COMPARISON: 03/01/2024 CLINICAL HISTORY: Head trauma, minor, normal mental status (age 78-64 years). FINDINGS: BRAIN AND VENTRICLES: Stable Chiari I malformation. No acute hemorrhage. No evidence of acute infarct. No hydrocephalus. No extra-axial collection. No mass effect or midline shift. ORBITS: No acute abnormality. SINUSES: No acute abnormality. SOFT TISSUES AND SKULL: No acute soft tissue abnormality. No skull fracture. IMPRESSION: 1. No acute intracranial abnormality. 2. Stable Chiari I malformation. Electronically signed by: Evalene Coho MD 06/14/2024 06:53 AM EST RP Workstation: [id number]   CT ABDOMEN PELVIS W CONTRAST Result Date: 06/08/2024 EXAM: CT ABDOMEN AND PELVIS WITH CONTRAST 06/08/2024 04:41:09 PM TECHNIQUE: CT of the abdomen and pelvis was performed with the administration of 75 mL of iohexol  (OMNIPAQUE ) 350 MG/ML injection.  Multiplanar reformatted images are provided for review. Automated exposure control, iterative reconstruction, and/or weight-based adjustment of the mA/kV was utilized to reduce the radiation dose to as low as reasonably achievable. COMPARISON: CT angio chest 05/30/2024, CT abdomen and pelvis 10/29/2023. CLINICAL HISTORY: Abdominal pain, acute, nonlocalized. FINDINGS: LOWER CHEST: Improved Bibasilar vague ground-glass airspace opacities that may represent atelectasis. LIVER: The liver is unremarkable. GALLBLADDER AND BILE DUCTS: Status post cholecystectomy. No biliary ductal dilatation. SPLEEN: Nonspecific 0.8 cm and 1.9 cm splenic hyperdensities. The spleen is normal in size. PANCREAS: No acute abnormality. ADRENAL GLANDS: No acute abnormality. KIDNEYS, URETERS AND BLADDER: No stones in the kidneys or ureters. No hydronephrosis. No perinephric or periureteral stranding. Urinary bladder is unremarkable. GI AND BOWEL: Stomach demonstrates no acute abnormality. No small or large bowel thickening or dilatation. The  appendix is not definitely identified with no inflammatory changes in the right lower quadrant to suggest acute appendicitis. Stool throughout the majority of the colon. There is no bowel obstruction. PERITONEUM AND RETROPERITONEUM: No ascites. No free air. VASCULATURE: Aorta is normal in caliber. LYMPH NODES: No lymphadenopathy. REPRODUCTIVE ORGANS: The uterus is unremarkable. No adnexal mass. BONES AND SOFT TISSUES: T10 vertebral body hemangioma. Left femoral head avascular necrosis. No focal soft tissue abnormality. IMPRESSION: 1. No acute findings in the abdomen or pelvis. 2. Stool throughout the majority of the colon, which can be seen with constipation. 3. Nonspecific 0.8 cm and 1.9 cm splenic hyperdensities, indeterminate; consider nonemergent contrast-enhanced abdominal MRI for further characterization. Electronically signed by: Morgane Naveau MD 06/08/2024 04:50 PM EST RP Workstation: [id number]   DG Chest Port 1 View Result Date: 06/06/2024 EXAM: 1 VIEW XRAY OF THE CHEST 06/06/2024 05:20:20 AM COMPARISON: CTA chest 05/30/2024. CLINICAL HISTORY: 19 year old female with sick contacts, Sickle Cell Disease, generalized body pain, cough/cold symptoms FINDINGS: LUNGS AND PLEURA: No focal lung opacity. No pleural effusion. No pneumothorax. HEART AND MEDIASTINUM: No abnormality of the cardiac and mediastinal silhouettes. BONES AND SOFT TISSUES: cholecystectomy clips. No osseous abnormality. IMPRESSION: 1. No acute cardiopulmonary abnormality. Electronically signed by: Helayne Hurst MD 06/06/2024 05:37 AM EST RP Workstation: [id number]   CT Angio Chest Pulmonary Embolism (PE) W or WO Contrast Result Date: 05/30/2024 CLINICAL DATA:  Hemoptysis EXAM: CT ANGIOGRAPHY CHEST WITH CONTRAST TECHNIQUE: Multidetector CT imaging of the chest was performed using the standard protocol during bolus administration of intravenous contrast. Multiplanar CT image reconstructions and MIPs were obtained to evaluate the vascular  anatomy. RADIATION DOSE REDUCTION: This exam was performed according to the departmental dose-optimization program which includes automated exposure control, adjustment of the mA and/or kV according to patient size and/or use of iterative reconstruction technique. CONTRAST:  75mL OMNIPAQUE  IOHEXOL  350 MG/ML SOLN COMPARISON:  Yesterday FINDINGS: Cardiovascular: Satisfactory opacification of the pulmonary arteries to the segmental level. No evidence of pulmonary embolism. Normal heart size. No pericardial effusion. Mediastinum/Nodes: No enlarged mediastinal, hilar, or axillary lymph nodes. Thyroid gland, trachea, and esophagus demonstrate no significant findings. Lungs/Pleura: Lungs are clear. No pleural effusion or pneumothorax. Upper Abdomen: No acute abnormality. Musculoskeletal: No chest wall abnormality. No acute or significant osseous findings. Review of the MIP images confirms the above findings. IMPRESSION: No definite evidence of pulmonary embolus. No definite abnormality seen in the chest. Electronically Signed   By: Lynwood Landy Raddle M.D.   On: 05/30/2024 10:58   DG CHEST PORT 1 VIEW Result Date: 05/29/2024 CLINICAL DATA:  Hemoptysis. EXAM: PORTABLE CHEST 1 VIEW COMPARISON:  Chest radiograph dated 05/27/2024. FINDINGS: The heart size and  mediastinal contours are within normal limits. Both lungs are clear. The visualized skeletal structures are unremarkable. IMPRESSION: No active disease. Electronically Signed   By: Vanetta Chou M.D.   On: 05/29/2024 13:27   DG Chest 2 View Result Date: 05/27/2024 EXAM: 2 VIEW(S) XRAY OF THE CHEST 05/27/2024 03:34:00 AM COMPARISON: 04/16/2024 CLINICAL HISTORY: Chest pain FINDINGS: LUNGS AND PLEURA: Bibasilar linear atelectasis/scarring. No pleural effusion. No pneumothorax. HEART AND MEDIASTINUM: No acute abnormality of the cardiac and mediastinal silhouettes. BONES AND SOFT TISSUES: Right upper quadrant surgical clips noted. No acute osseous abnormality. IMPRESSION:  1. No acute cardiopulmonary abnormality. Electronically signed by: Pinkie Pebbles MD MD 05/27/2024 03:38 AM EST RP Workstation: [id number]    Microbiology: No results found for this or any previous visit (from the past 240 hours).   Labs: Basic Metabolic Panel: Recent Labs  Lab 06/09/24 0426 06/10/24 0354 06/11/24 0455 06/12/24 0446 06/13/24 0433  NA 137 137 136 137 136  K 4.0 4.2 4.0 4.2 4.4  CL 105 105 103 103 103  CO2 22 23 23 23  20*  GLUCOSE 90 88 82 84 96  BUN 8 <5* 5* 7 10  CREATININE 0.64 0.57 0.54 0.61 0.56  CALCIUM 9.1 9.5 9.4 9.4 9.4   Liver Function Tests: Recent Labs  Lab 06/09/24 0426 06/10/24 0354 06/11/24 0455 06/12/24 0446 06/13/24 0433  AST 29 29 30  37 50*  ALT 28 20 21 26  36  ALKPHOS 127* 121 119 120 121  BILITOT 3.6* 3.5* 3.9* 4.0* 3.4*  PROT 7.7 7.5 7.4 7.6 7.7  ALBUMIN 4.0 4.0 3.9 3.9 4.0   No results for input(s): LIPASE, AMYLASE in the last 168 hours. No results for input(s): AMMONIA in the last 168 hours. CBC: Recent Labs  Lab 06/10/24 0354 06/11/24 0455 06/12/24 0446 06/13/24 0433 06/15/24 0120  WBC 14.2* 10.7* 11.0* 12.6* 13.6*  HGB 8.5* 8.1* 8.3* 8.2* 8.2*  HCT 25.4* 24.4* 25.2* 24.7* 24.2*  MCV 91.4 92.1 92.6 91.1 89.6  PLT 486* 493* 490* 359 458*   Cardiac Enzymes: No results for input(s): CKTOTAL, CKMB, CKMBINDEX, TROPONINI in the last 168 hours. BNP: Invalid input(s): POCBNP CBG: Recent Labs  Lab 06/14/24 0617  GLUCAP 80    Time coordinating discharge: 50 minutes  Signed:  Marlan Steward  [facility] 06/15/2024, 1:41 PM

## 2024-06-15 NOTE — Plan of Care (Signed)

## 2024-06-17 ENCOUNTER — Telehealth: Payer: Self-pay

## 2024-06-19 ENCOUNTER — Telehealth: Payer: Self-pay | Admitting: *Deleted

## 2024-06-19 NOTE — Transitions of Care (Post Inpatient/ED Visit) (Signed)
" ° °  06/19/2024  Name: Bailey Reese MRN: [id number] DOB: 10-14-2005  Today's TOC FU Call Status: Today's TOC FU Call Status:: Unsuccessful Call (2nd Attempt) Unsuccessful Call (2nd Attempt) Date: 06/19/24  Attempted to reach the patient regarding the most recent Inpatient/ED visit.  Follow Up Plan: Additional outreach attempts will be made to reach the patient to complete the Transitions of Care (Post Inpatient/ED visit) call.    Olam Ku, RN, BSN, MSN [facility], [facility] RN Care Manager Direct Dial: [phone]  Fax: [phone]   "
